# Patient Record
Sex: Male | Born: 1940 | Race: Black or African American | Hispanic: No | Smoking: Former smoker
Health system: Southern US, Community
[De-identification: ages and names within clinical notes are randomized; demographics above are authoritative.]

## PROBLEM LIST (undated history)

## (undated) DIAGNOSIS — S76119A Strain of unspecified quadriceps muscle, fascia and tendon, initial encounter: Secondary | ICD-10-CM

## (undated) DIAGNOSIS — E785 Hyperlipidemia, unspecified: Secondary | ICD-10-CM

## (undated) DIAGNOSIS — C8590 Non-Hodgkin lymphoma, unspecified, unspecified site: Secondary | ICD-10-CM

## (undated) DIAGNOSIS — I10 Essential (primary) hypertension: Secondary | ICD-10-CM

## (undated) DIAGNOSIS — I2510 Atherosclerotic heart disease of native coronary artery without angina pectoris: Secondary | ICD-10-CM

## (undated) DIAGNOSIS — I219 Acute myocardial infarction, unspecified: Secondary | ICD-10-CM

## (undated) DIAGNOSIS — H9190 Unspecified hearing loss, unspecified ear: Secondary | ICD-10-CM

## (undated) DIAGNOSIS — M1990 Unspecified osteoarthritis, unspecified site: Secondary | ICD-10-CM

## (undated) DIAGNOSIS — I255 Ischemic cardiomyopathy: Secondary | ICD-10-CM

## (undated) HISTORY — DX: Non-Hodgkin lymphoma, unspecified, unspecified site: C85.90

## (undated) HISTORY — PX: COLONOSCOPY: SHX174

## (undated) HISTORY — PX: OTHER SURGICAL HISTORY: SHX169

---

## 1998-09-20 ENCOUNTER — Ambulatory Visit (HOSPITAL_COMMUNITY): Admission: RE | Admit: 1998-09-20 | Discharge: 1998-09-20 | Payer: Self-pay | Admitting: *Deleted

## 2002-05-22 ENCOUNTER — Ambulatory Visit (HOSPITAL_COMMUNITY): Admission: RE | Admit: 2002-05-22 | Discharge: 2002-05-22 | Payer: Self-pay | Admitting: *Deleted

## 2008-02-10 HISTORY — PX: THROAT SURGERY: SHX803

## 2008-05-28 ENCOUNTER — Encounter: Admission: RE | Admit: 2008-05-28 | Discharge: 2008-08-07 | Payer: Self-pay | Admitting: Otolaryngology

## 2011-06-08 ENCOUNTER — Other Ambulatory Visit: Payer: Self-pay | Admitting: Family Medicine

## 2011-06-08 DIAGNOSIS — R609 Edema, unspecified: Secondary | ICD-10-CM

## 2011-06-09 ENCOUNTER — Ambulatory Visit
Admission: RE | Admit: 2011-06-09 | Discharge: 2011-06-09 | Disposition: A | Discharge status: Home / Self Care | Payer: BC Managed Care – PPO | Source: Ambulatory Visit | Attending: Family Medicine | Admitting: Family Medicine

## 2011-06-09 DIAGNOSIS — R609 Edema, unspecified: Secondary | ICD-10-CM

## 2014-02-09 DIAGNOSIS — I219 Acute myocardial infarction, unspecified: Secondary | ICD-10-CM

## 2014-02-09 HISTORY — DX: Acute myocardial infarction, unspecified: I21.9

## 2014-02-10 ENCOUNTER — Emergency Department (HOSPITAL_COMMUNITY)
Admission: EM | Admit: 2014-02-10 | Discharge: 2014-02-10 | Disposition: A | Discharge status: Home / Self Care | Payer: Medicare PPO | Attending: Emergency Medicine | Admitting: Emergency Medicine

## 2014-02-10 ENCOUNTER — Encounter (HOSPITAL_COMMUNITY): Payer: Self-pay | Admitting: Emergency Medicine

## 2014-02-10 ENCOUNTER — Emergency Department (HOSPITAL_COMMUNITY): Payer: Medicare PPO

## 2014-02-10 DIAGNOSIS — W19XXXA Unspecified fall, initial encounter: Secondary | ICD-10-CM

## 2014-02-10 DIAGNOSIS — Y9389 Activity, other specified: Secondary | ICD-10-CM | POA: Diagnosis not present

## 2014-02-10 DIAGNOSIS — Y92009 Unspecified place in unspecified non-institutional (private) residence as the place of occurrence of the external cause: Secondary | ICD-10-CM | POA: Insufficient documentation

## 2014-02-10 DIAGNOSIS — E785 Hyperlipidemia, unspecified: Secondary | ICD-10-CM | POA: Insufficient documentation

## 2014-02-10 DIAGNOSIS — W010XXA Fall on same level from slipping, tripping and stumbling without subsequent striking against object, initial encounter: Secondary | ICD-10-CM | POA: Diagnosis not present

## 2014-02-10 DIAGNOSIS — I10 Essential (primary) hypertension: Secondary | ICD-10-CM | POA: Insufficient documentation

## 2014-02-10 DIAGNOSIS — Z79899 Other long term (current) drug therapy: Secondary | ICD-10-CM | POA: Insufficient documentation

## 2014-02-10 DIAGNOSIS — Y998 Other external cause status: Secondary | ICD-10-CM | POA: Diagnosis not present

## 2014-02-10 DIAGNOSIS — Z8739 Personal history of other diseases of the musculoskeletal system and connective tissue: Secondary | ICD-10-CM | POA: Diagnosis not present

## 2014-02-10 DIAGNOSIS — S8991XA Unspecified injury of right lower leg, initial encounter: Secondary | ICD-10-CM | POA: Diagnosis not present

## 2014-02-10 DIAGNOSIS — S8992XA Unspecified injury of left lower leg, initial encounter: Secondary | ICD-10-CM | POA: Diagnosis present

## 2014-02-10 DIAGNOSIS — S76112A Strain of left quadriceps muscle, fascia and tendon, initial encounter: Secondary | ICD-10-CM | POA: Diagnosis not present

## 2014-02-10 HISTORY — DX: Unspecified osteoarthritis, unspecified site: M19.90

## 2014-02-10 HISTORY — DX: Essential (primary) hypertension: I10

## 2014-02-10 HISTORY — DX: Hyperlipidemia, unspecified: E78.5

## 2014-02-10 NOTE — ED Notes (Signed)
Carl [name], Network engineer reported that nurse care manager is working on having pt's walker delivered to ED at this time

## 2014-02-10 NOTE — Progress Notes (Signed)
NCM will have RW shipped to home. Pt has [employer]. Best contact number [phone].Jonnie Finner RN CCM Case Mgmt phone [phone]

## 2014-02-10 NOTE — ED Notes (Signed)
Discharge delayed. Pt waiting for walker

## 2014-02-10 NOTE — ED Notes (Signed)
Bed: WA01 Expected date: 02/10/14 Expected time: 11:01 AM Means of arrival: Ambulance Comments: fall

## 2014-02-10 NOTE — Discharge Instructions (Signed)
Fall Prevention and Home Safety Falls cause injuries and can affect all age groups. It is possible to use preventive measures to significantly decrease the likelihood of falls. There are many simple measures which can make your home safer and prevent falls. OUTDOORS  Repair cracks and edges of walkways and driveways.  Remove high doorway thresholds.  Trim shrubbery on the main path into your home.  Have good outside lighting.  Clear walkways of tools, rocks, debris, and clutter.  Check that handrails are not broken and are securely fastened. Both sides of steps should have handrails.  Have leaves, snow, and ice cleared regularly.  Use sand or salt on walkways during winter months.  In the garage, clean up grease or oil spills. BATHROOM  Install night lights.  Install grab bars by the toilet and in the tub and shower.  Use non-skid mats or decals in the tub or shower.  Place a plastic non-slip stool in the shower to sit on, if needed.  Keep floors dry and clean up all water on the floor immediately.  Remove soap buildup in the tub or shower on a regular basis.  Secure bath mats with non-slip, double-sided rug tape.  Remove throw rugs and tripping hazards from the floors. BEDROOMS  Install night lights.  Make sure a bedside light is easy to reach.  Do not use oversized bedding.  Keep a telephone by your bedside.  Have a firm chair with side arms to use for getting dressed.  Remove throw rugs and tripping hazards from the floor. KITCHEN  Keep handles on pots and pans turned toward the center of the stove. Use back burners when possible.  Clean up spills quickly and allow time for drying.  Avoid walking on wet floors.  Avoid hot utensils and knives.  Position shelves so they are not too high or low.  Place commonly used objects within easy reach.  If necessary, use a sturdy step stool with a grab bar when reaching.  Keep electrical cables out of the  way.  Do not use floor polish or wax that makes floors slippery. If you must use wax, use non-skid floor wax.  Remove throw rugs and tripping hazards from the floor. STAIRWAYS  Never leave objects on stairs.  Place handrails on both sides of stairways and use them. Fix any loose handrails. Make sure handrails on both sides of the stairways are as long as the stairs.  Check carpeting to make sure it is firmly attached along stairs. Make repairs to worn or loose carpet promptly.  Avoid placing throw rugs at the top or bottom of stairways, or properly secure the rug with carpet tape to prevent slippage. Get rid of throw rugs, if possible.  Have an electrician put in a light switch at the top and bottom of the stairs. OTHER FALL PREVENTION TIPS  Wear low-heel or rubber-soled shoes that are supportive and fit well. Wear closed toe shoes.  When using a stepladder, make sure it is fully opened and both spreaders are firmly locked. Do not climb a closed stepladder.  Add color or contrast paint or tape to grab bars and handrails in your home. Place contrasting color strips on first and last steps.  Learn and use mobility aids as needed. Install an electrical emergency response system.  Turn on lights to avoid dark areas. Replace light bulbs that burn out immediately. Get light switches that glow.  Arrange furniture to create clear pathways. Keep furniture in the same place.  Firmly attach carpet with non-skid or double-sided tape.  Eliminate uneven floor surfaces.  Select a carpet pattern that does not visually hide the edge of steps.  Be aware of all pets. OTHER HOME SAFETY TIPS  Set the water temperature for 120 F (48.8 C).  Keep emergency numbers on or near the telephone.  Keep smoke detectors on every level of the home and near sleeping areas. Document Released: 01/16/2002 Document Revised: 07/28/2011 Document Reviewed: 04/17/2011 [facility] Patient Information 2015  [place], [place]. This information is not intended to replace advice given to you by your health care provider. Make sure you discuss any questions you have with your health care provider.   Tendon Injury Tendons are strong, cordlike structures that connect muscle to bone. Tendons are made up of woven fibers, like a rope. A tendon injury is a tear (rupture) of the tendon. The rupture may be partial (only a few of the fibers in your tendon rupture) or complete (your entire tendon ruptures). CAUSES  Tendon injuries can be caused by high-stress activities, such as sports. They also can be caused by a repetitive injury or by a single injury from an excessive, rapid force. SYMPTOMS  Symptoms of tendon injury include pain when you move the joint close to the tendon. Other symptoms are swelling, redness, and warmth. DIAGNOSIS  Tendon injuries often can be diagnosed by physical exam. However, sometimes an X-ray exam or advanced imaging, such as magnetic resonance imaging (MRI), is necessary to determine the extent of the injury. TREATMENT  Partial tendon ruptures often can be treated with immobilization. A splint, bandage, or removable brace usually is used to immobilize the injured tendon. Most injured tendons need to be immobilized for 1-2 months before they are completely healed. Complete tendon ruptures may require surgical reattachment. Document Released: 03/05/2004 Document Revised: 01/15/2011 Document Reviewed: 04/19/2011 [facility] Patient Information 2015 [place], [place]. This information is not intended to replace advice given to you by your health care provider. Make sure you discuss any questions you have with your health care provider.

## 2014-02-10 NOTE — ED Notes (Signed)
Waiting on home health to bring walker still, staff member is en route, pt and family updated

## 2014-02-10 NOTE — ED Notes (Signed)
PER EMS- pt picked up from home with c/o fall x10hours.  Reports bilateral knee pain, 8/10 if standing, with difficulty lifting leg. Pt tripped and fell, denies LOC or head injury. Arrived to ED alert and oriented per baseline.  Voice is horse per baseline. Bruising noted, no deformities.

## 2014-02-10 NOTE — ED Notes (Signed)
Bed: WHALA Expected date:  Expected time:  Means of arrival:  Comments: 

## 2014-02-10 NOTE — ED Provider Notes (Signed)
CSN: [id number]     Arrival date & time 02/10/14  1111 History   First MD Initiated Contact with Patient 02/10/14 1128     Chief Complaint  Patient presents with  . Fall  . Knee Pain     (Consider location/radiation/quality/duration/timing/severity/associated sxs/prior Treatment) HPI Comments: Patient presents with bilateral lateral knee pain, left greater than right after fall in his home about 1 AM early this morning.  He states he thinks he may be tripped on some furniture while getting ready for bed.  Denies head injury or loss of consciousness as well as anticoagulant use.  He states he has been unable to ambulate, and cannot lift his left leg up off the bed.  Patient is a 74 y.o. male presenting with fall and knee pain.  Fall Pertinent negatives include no chest pain, no abdominal pain, no headaches and no shortness of breath.  Knee Pain Associated symptoms: no back pain, no fatigue and no fever     Past Medical History  Diagnosis Date  . Hypertension   . Hyperlipidemia   . Arthritis    Past Surgical History  Procedure Laterality Date  . Throat surgery     History reviewed. No pertinent family history. History  Substance Use Topics  . Smoking status: Never Smoker   . Smokeless tobacco: Not on file  . Alcohol Use: Yes    Review of Systems  Constitutional: Negative for fever, activity change, appetite change and fatigue.  HENT: Negative for congestion, facial swelling, rhinorrhea and trouble swallowing.   Eyes: Negative for photophobia and pain.  Respiratory: Negative for cough, chest tightness and shortness of breath.   Cardiovascular: Negative for chest pain and leg swelling.  Gastrointestinal: Negative for nausea, vomiting, abdominal pain, diarrhea and constipation.  Endocrine: Negative for polydipsia and polyuria.  Genitourinary: Negative for dysuria, urgency, decreased urine volume and difficulty urinating.  Musculoskeletal: Negative for back pain and gait  problem.  Skin: Negative for color change, rash and wound.  Allergic/Immunologic: Negative for immunocompromised state.  Neurological: Positive for dizziness. Negative for facial asymmetry, speech difficulty, weakness, numbness and headaches.  Psychiatric/Behavioral: Negative for confusion, decreased concentration and agitation.      Allergies  Review of patient's allergies indicates no known allergies.  Home Medications   Prior to Admission medications   Medication Sig Start Date End Date Taking? Authorizing Provider  amLODipine (NORVASC) 5 MG tablet Take 5 mg by mouth daily.  01/25/14  Yes Historical Provider, MD  cholecalciferol (VITAMIN D) 1000 UNITS tablet Take 1,000 Units by mouth daily.   Yes Historical Provider, MD  Multiple Vitamin (MULTIVITAMIN WITH MINERALS) TABS tablet Take 1 tablet by mouth daily.   Yes Historical Provider, MD   BP 163/79 mmHg  Pulse 88  Temp(Src) 98.4 F (36.9 C) (Oral)  Resp 16  SpO2 96% Physical Exam  Constitutional: He is oriented to person, place, and time. He appears well-developed and well-nourished. No distress.  HENT:  Head: Normocephalic and atraumatic.  Mouth/Throat: No oropharyngeal exudate.  Eyes: Pupils are equal, round, and reactive to light.  Neck: Normal range of motion. Neck supple.  Cardiovascular: Normal rate, regular rhythm and normal heart sounds.  Exam reveals no gallop and no friction rub.   No murmur heard. Pulmonary/Chest: Effort normal and breath sounds normal. No respiratory distress. He has no wheezes. He has no rales.  Abdominal: Soft. Bowel sounds are normal. He exhibits no distension and no mass. There is no tenderness. There is no rebound and no  guarding.  Musculoskeletal: He exhibits no edema.       Right knee: He exhibits bony tenderness.       Left knee: He exhibits decreased range of motion. Tenderness found.       Legs: Neurological: He is alert and oriented to person, place, and time.  Skin: Skin is warm  and dry.  Psychiatric: He has a normal mood and affect.    ED Course  Procedures (including critical care time) Labs Review Labs Reviewed - No data to display  Imaging Review No results found.   EKG Interpretation None      MDM   Final diagnoses:  Fall at home    Pt is a 74 y.o. male with Pmhx as above who presents with bilateral knee pain, left greater then right since a fall at home about 1 AM yesterday while he was getting ready for bed.  He thinks maybe he got tripped on some furniture.  He denies head injury or loss of consciousness.  Daughter reports he has had some dizziness lately which she describes as swimmy headedness when he lays down, which last 2-3 seconds at a time.  On physical exam vital signs are stable and he is in no acute distress.  He has a deformity just superior to his left patella and is unable to fully flex or extend the left knee.  He is also unable to lift the left leg off the bed.  He has normal sensation distally and normal range of motion of the right leg.   X-rays of bilateral knees, pelvis and left femur are negative for acute bony injury.  Physical exam is consistent with quadriceps tendon rupture.  Patient placed in a left knee immobilizer, given crutches and is able to ambulate slowly.  I spoke with Dr. Sharol Given, of [facility] orthopedics who would like him to call the office on Monday for follow-up appointment.  Patient again does not want pain medication.  He's been instructed on rest, ice and elevation.   Artis Delay evaluation in the Emergency Department is complete. It has been determined that no acute conditions requiring further emergency intervention are present at this time. The patient/guardian have been advised of the diagnosis and plan. We have discussed signs and symptoms that warrant return to the ED, such as changes or worsening in symptoms, worsening pain, swelling.      Ernestina Patches, MD 02/10/14 1324

## 2014-02-10 NOTE — ED Notes (Signed)
Unable to ambulate with crutches, requesting a walker. Dr made aware and reported she will order a walker for pt.

## 2014-02-10 NOTE — Care Management (Signed)
ED CM spoke with patient's family in regards to recommendation for HHPT services and DME rolling walker. Patient and family agreeable, offered choice. Carl [name] selected. Referral faxed to [facility] [phone], received fax confirmation. DME rolling walker delivered to patient in the ED.  Patient and family agreeable with disposition plan. Discussed that within 24-48 hours someone from [facility] will call to arrange an initial assessment visit.  CM Contact information provided should any further questions or concerns should arise.

## 2014-02-15 NOTE — Patient Instructions (Addendum)
Carl [name]  02/15/2014   Your procedure is scheduled on: 02/20/14   Report to [facility]  Entrance and follow signs to               [place] at 2:15 PM   Call this number if you have problems the morning of surgery [phone]   Remember:  Do not eat food  :After Midnight.             MAY HAVE CLEAR LIQUIDS UNTIL 11:00 AM            CLEAR LIQUID DIET   Foods Allowed                                                                     Foods Excluded  Coffee and tea, regular and decaf                             liquids that you cannot  Plain Jell-O in any flavor                                             see through such as: Fruit ices (not with fruit pulp)                                     milk, soups, orange juice  Iced Popsicles                                    All solid food Carbonated beverages, regular and diet                                    Cranberry, grape and apple juices Sports drinks like Gatorade Lightly seasoned clear broth or consume(fat free) Sugar, honey syrup  _____________________________________________________________________        Take these medicines the morning of surgery with A SIP OF WATER: NONE                               You may not have any metal on your body including hair pins and              piercings  Do not wear jewelry, make-up, lotions, powders or perfumes.             Do not wear nail polish.  Do not shave  48 hours prior to surgery.              Men may shave face and neck.   Do not bring valuables to the hospital. [place].  Contacts, dentures or bridgework may  not be worn into surgery.  Leave suitcase in the car. After surgery it may be brought to your room.     Patients discharged the day of surgery will not be allowed to drive home.  Name and phone number of your driver:  Special Instructions: N/A              Please  read over the following fact sheets you were given: _____________________________________________________________________                                                     Carl [name]  Before surgery, you can play an important role.  Because skin is not sterile, your skin needs to be as free of germs as possible.  You can reduce the number of germs on your skin by washing with CHG (chlorahexidine gluconate) soap before surgery.  CHG is an antiseptic cleaner which kills germs and bonds with the skin to continue killing germs even after washing. Please DO NOT use if you have an allergy to CHG or antibacterial soaps.  If your skin becomes reddened/irritated stop using the CHG and inform your nurse when you arrive at Short Stay. Do not shave (including legs and underarms) for at least 48 hours prior to the first CHG shower.  You may shave your face. Please follow these instructions carefully:   1.  Shower with CHG Soap the night before surgery and the  morning of Surgery.   2.  If you choose to wash your hair, wash your hair first as usual with your  normal  Shampoo.   3.  After you shampoo, rinse your hair and body thoroughly to remove the  shampoo.                                         4.  Use CHG as you would any other liquid soap.  You can apply chg directly  to the skin and wash . Gently wash with scrungie or clean wascloth    5.  Apply the CHG Soap to your body ONLY FROM THE NECK DOWN.   Do not use on open                           Wound or open sores. Avoid contact with eyes, ears mouth and genitals (private parts).                        Genitals (private parts) with your normal soap.              6.  Wash thoroughly, paying special attention to the area where your surgery  will be performed.   7.  Thoroughly rinse your body with warm water from the neck down.   8.  DO NOT shower/wash with your normal soap after using and rinsing off  the CHG Soap .                 9.  Pat yourself dry with a clean towel.             10.  Wear clean pajamas.  11.  Place clean sheets on your bed the night of your first shower and do not  sleep with pets.  Day of Surgery : Do not apply any lotions/deodorants the morning of surgery.  Please wear clean clothes to the hospital/surgery center.  FAILURE TO FOLLOW THESE INSTRUCTIONS MAY RESULT IN THE CANCELLATION OF YOUR SURGERY    PATIENT SIGNATURE_________________________________  ______________________________________________________________________     Carl [name]  An incentive spirometer is a tool that can help keep your lungs clear and active. This tool measures how well you are filling your lungs with each breath. Taking long deep breaths may help reverse or decrease the chance of developing breathing (pulmonary) problems (especially infection) following:  A long period of time when you are unable to move or be active. BEFORE THE PROCEDURE   If the spirometer includes an indicator to show your best effort, your nurse or respiratory therapist will set it to a desired goal.  If possible, sit up straight or lean slightly forward. Try not to slouch.  Hold the incentive spirometer in an upright position. INSTRUCTIONS FOR USE   Sit on the edge of your bed if possible, or sit up as far as you can in bed or on a chair.  Hold the incentive spirometer in an upright position.  Breathe out normally.  Place the mouthpiece in your mouth and seal your lips tightly around it.  Breathe in slowly and as deeply as possible, raising the piston or the ball toward the top of the column.  Hold your breath for 3-5 seconds or for as long as possible. Allow the piston or ball to fall to the bottom of the column.  Remove the mouthpiece from your mouth and breathe out normally.  Rest for a few seconds and repeat Steps 1 through 7 at least 10 times every 1-2 hours when you are awake. Take your  time and take a few normal breaths between deep breaths.  The spirometer may include an indicator to show your best effort. Use the indicator as a goal to work toward during each repetition.  After each set of 10 deep breaths, practice coughing to be sure your lungs are clear. If you have an incision (the cut made at the time of surgery), support your incision when coughing by placing a pillow or rolled up towels firmly against it. Once you are able to get out of bed, walk around indoors and cough well. You may stop using the incentive spirometer when instructed by your caregiver.  RISKS AND COMPLICATIONS  Take your time so you do not get dizzy or light-headed.  If you are in pain, you may need to take or ask for pain medication before doing incentive spirometry. It is harder to take a deep breath if you are having pain. AFTER USE  Rest and breathe slowly and easily.  It can be helpful to keep track of a log of your progress. Your caregiver can provide you with a simple table to help with this. If you are using the spirometer at home, follow these instructions: [place] IF:   You are having difficultly using the spirometer.  You have trouble using the spirometer as often as instructed.  Your pain medication is not giving enough relief while using the spirometer.  You develop fever of 100.5 F (38.1 C) or higher. SEEK IMMEDIATE MEDICAL CARE IF:   You cough up bloody sputum that had not been present before.  You develop fever of 102 F (  38.9 C) or greater.  You develop worsening pain at or near the incision site. MAKE SURE YOU:   Understand these instructions.  Will watch your condition.  Will get help right away if you are not doing well or get worse. Document Released: 06/08/2006 Document Revised: 04/20/2011 Document Reviewed: 08/09/2006 ExitCare Patient Information 2014 ExitCare,  [place].   ________________________________________________________________________  WHAT IS A BLOOD TRANSFUSION? Blood Transfusion Information  A transfusion is the replacement of blood or some of its parts. Blood is made up of multiple cells which provide different functions.  Red blood cells carry oxygen and are used for blood loss replacement.  White blood cells fight against infection.  Platelets control bleeding.  Plasma helps clot blood.  Other blood products are available for specialized needs, such as hemophilia or other clotting disorders. BEFORE THE TRANSFUSION  Who gives blood for transfusions?   Healthy volunteers who are fully evaluated to make sure their blood is safe. This is blood bank blood. Transfusion therapy is the safest it has ever been in the practice of medicine. Before blood is taken from a donor, a complete history is taken to make sure that person has no history of diseases nor engages in risky social behavior (examples are intravenous drug use or sexual activity with multiple partners). The donor's travel history is screened to minimize risk of transmitting infections, such as malaria. The donated blood is tested for signs of infectious diseases, such as HIV and hepatitis. The blood is then tested to be sure it is compatible with you in order to minimize the chance of a transfusion reaction. If you or a relative donates blood, this is often done in anticipation of surgery and is not appropriate for emergency situations. It takes many days to process the donated blood. RISKS AND COMPLICATIONS Although transfusion therapy is very safe and saves many lives, the main dangers of transfusion include:   Getting an infectious disease.  Developing a transfusion reaction. This is an allergic reaction to something in the blood you were given. Every precaution is taken to prevent this. The decision to have a blood transfusion has been considered carefully by your caregiver  before blood is given. Blood is not given unless the benefits outweigh the risks. AFTER THE TRANSFUSION  Right after receiving a blood transfusion, you will usually feel much better and more energetic. This is especially true if your red blood cells have gotten low (anemic). The transfusion raises the level of the red blood cells which carry oxygen, and this usually causes an energy increase.  The nurse administering the transfusion will monitor you carefully for complications. HOME CARE INSTRUCTIONS  No special instructions are needed after a transfusion. You may find your energy is better. Speak with your caregiver about any limitations on activity for underlying diseases you may have. SEEK MEDICAL CARE IF:   Your condition is not improving after your transfusion.  You develop redness or irritation at the intravenous (IV) site. SEEK IMMEDIATE MEDICAL CARE IF:  Any of the following symptoms occur over the next 12 hours:  Shaking chills.  You have a temperature by mouth above 102 F (38.9 C), not controlled by medicine.  Chest, back, or muscle pain.  People around you feel you are not acting correctly or are confused.  Shortness of breath or difficulty breathing.  Dizziness and fainting.  You get a rash or develop hives.  You have a decrease in urine output.  Your urine turns a dark color or changes to  pink, red, or brown. Any of the following symptoms occur over the next 10 days:  You have a temperature by mouth above 102 F (38.9 C), not controlled by medicine.  Shortness of breath.  Weakness after normal activity.  The white part of the eye turns yellow (jaundice).  You have a decrease in the amount of urine or are urinating less often.  Your urine turns a dark color or changes to pink, red, or brown. Document Released: 01/24/2000 Document Revised: 04/20/2011 Document Reviewed: 09/12/2007 [facility] Patient Information 2014 [place],  [place].  _______________________________________________________________________

## 2014-02-16 ENCOUNTER — Other Ambulatory Visit: Payer: Self-pay

## 2014-02-16 ENCOUNTER — Encounter (HOSPITAL_COMMUNITY): Payer: Self-pay

## 2014-02-16 ENCOUNTER — Ambulatory Visit (HOSPITAL_COMMUNITY)
Admission: RE | Admit: 2014-02-16 | Discharge: 2014-02-16 | Disposition: A | Discharge status: Home / Self Care | Payer: Medicare PPO | Source: Ambulatory Visit | Attending: Anesthesiology | Admitting: Anesthesiology

## 2014-02-16 ENCOUNTER — Encounter (HOSPITAL_COMMUNITY)
Admission: RE | Admit: 2014-02-16 | Discharge: 2014-02-16 | Disposition: A | Discharge status: Home / Self Care | Payer: Medicare PPO | Source: Ambulatory Visit | Attending: Orthopedic Surgery | Admitting: Orthopedic Surgery

## 2014-02-16 DIAGNOSIS — I493 Ventricular premature depolarization: Secondary | ICD-10-CM | POA: Insufficient documentation

## 2014-02-16 DIAGNOSIS — I10 Essential (primary) hypertension: Secondary | ICD-10-CM | POA: Diagnosis not present

## 2014-02-16 DIAGNOSIS — X58XXXA Exposure to other specified factors, initial encounter: Secondary | ICD-10-CM | POA: Diagnosis not present

## 2014-02-16 DIAGNOSIS — S76119A Strain of unspecified quadriceps muscle, fascia and tendon, initial encounter: Secondary | ICD-10-CM | POA: Diagnosis not present

## 2014-02-16 DIAGNOSIS — I771 Stricture of artery: Secondary | ICD-10-CM | POA: Insufficient documentation

## 2014-02-16 DIAGNOSIS — M4184 Other forms of scoliosis, thoracic region: Secondary | ICD-10-CM | POA: Diagnosis not present

## 2014-02-16 DIAGNOSIS — Z01818 Encounter for other preprocedural examination: Secondary | ICD-10-CM | POA: Diagnosis present

## 2014-02-16 HISTORY — DX: Strain of unspecified quadriceps muscle, fascia and tendon, initial encounter: S76.119A

## 2014-02-16 LAB — CBC
HCT: 43 % (ref 39.0–52.0)
Hemoglobin: 14.2 g/dL (ref 13.0–17.0)
MCH: 31.1 pg (ref 26.0–34.0)
MCHC: 33 g/dL (ref 30.0–36.0)
MCV: 94.1 fL (ref 78.0–100.0)
Platelets: 187 10*3/uL (ref 150–400)
RBC: 4.57 MIL/uL (ref 4.22–5.81)
RDW: 12.5 % (ref 11.5–15.5)
WBC: 6 10*3/uL (ref 4.0–10.5)

## 2014-02-16 LAB — BASIC METABOLIC PANEL
Anion gap: 3 — ABNORMAL LOW (ref 5–15)
BUN: 18 mg/dL (ref 6–23)
CO2: 25 mmol/L (ref 19–32)
Calcium: 8.6 mg/dL (ref 8.4–10.5)
Chloride: 110 mEq/L (ref 96–112)
Creatinine, Ser: 1.11 mg/dL (ref 0.50–1.35)
GFR calc Af Amer: 73 mL/min — ABNORMAL LOW (ref >90)
GFR calc non Af Amer: 63 mL/min — ABNORMAL LOW (ref >90)
Glucose, Bld: 103 mg/dL — ABNORMAL HIGH (ref 70–99)
Potassium: 3.9 mmol/L (ref 3.5–5.1)
Sodium: 138 mmol/L (ref 135–145)

## 2014-02-16 LAB — URINALYSIS, ROUTINE W REFLEX MICROSCOPIC
Bilirubin Urine: NEGATIVE
Glucose, UA: NEGATIVE mg/dL
Ketones, ur: NEGATIVE mg/dL
Leukocytes, UA: NEGATIVE
Nitrite: NEGATIVE
Protein, ur: NEGATIVE mg/dL
Specific Gravity, Urine: 1.022 (ref 1.005–1.030)
Urobilinogen, UA: 1 mg/dL (ref 0.0–1.0)
pH: 5.5 (ref 5.0–8.0)

## 2014-02-16 LAB — URINE MICROSCOPIC-ADD ON

## 2014-02-16 LAB — APTT: aPTT: 34 seconds (ref 24–37)

## 2014-02-16 LAB — PROTIME-INR
INR: 1.05 (ref 0.00–1.49)
Prothrombin Time: 13.8 seconds (ref 11.6–15.2)

## 2014-02-16 LAB — ABO/RH: ABO/RH(D): B POS

## 2014-02-16 NOTE — Progress Notes (Signed)
   02/16/14 1148  OBSTRUCTIVE SLEEP APNEA  Have you ever been diagnosed with sleep apnea through a sleep study? No  Do you snore loudly (loud enough to be heard through closed doors)?  1  Do you often feel tired, fatigued, or sleepy during the daytime? 0  Has anyone observed you stop breathing during your sleep? 0  Do you have, or are you being treated for high blood pressure? 1  BMI more than 35 kg/m2? 1  Age over 76 years old? 1  Neck circumference greater than 40 cm/16 inches? 1  Gender: 1  Obstructive Sleep Apnea Score 6  Score 4 or greater  Results sent to PCP

## 2014-02-19 NOTE — Anesthesia Preprocedure Evaluation (Addendum)
Anesthesia Evaluation  Patient identified by MRN, date of birth, ID band Patient awake    Reviewed: Allergy & Precautions, NPO status , Patient's Chart, lab work & pertinent test results  Airway Mallampati: II  TM Distance: >3 FB Neck ROM: Full   Comment: Hoarseness chronically.  Had vocal cord procedure in 4540 with Dr. Erik Obey. Dental no notable dental hx.    Pulmonary neg pulmonary ROS,  breath sounds clear to auscultation  Pulmonary exam normal       Cardiovascular hypertension, Pt. on medications Rhythm:Regular Rate:Normal     Neuro/Psych negative neurological ROS  negative psych ROS   GI/Hepatic negative GI ROS, Neg liver ROS,   Endo/Other  negative endocrine ROS  Renal/GU negative Renal ROS  negative genitourinary   Musculoskeletal  (+) Arthritis -,   Abdominal (+) + obese,   Peds negative pediatric ROS (+)  Hematology negative hematology ROS (+)   Anesthesia Other Findings   Reproductive/Obstetrics negative OB ROS                           Anesthesia Physical Anesthesia Plan  ASA: II  Anesthesia Plan: Spinal   Post-op Pain Management:    Induction: Intravenous  Airway Management Planned:   Additional Equipment:   Intra-op Plan:   Post-operative Plan:   Informed Consent: I have reviewed the patients History and Physical, chart, labs and discussed the procedure including the risks, benefits and alternatives for the proposed anesthesia with the patient or authorized representative who has indicated his/her understanding and acceptance.   Dental advisory given  Plan Discussed with: CRNA  Anesthesia Plan Comments: (Discussed spinal and general. Discussed risks/benefits of spinal including headache, backache, failure, bleeding, infection, and nerve damage. Patient consents to spinal. Questions answered. Coagulation studies and platelet count acceptable.)        Anesthesia Quick Evaluation

## 2014-02-19 NOTE — Progress Notes (Signed)
Chart reviewed by Dr. Kirke Shaggy

## 2014-02-19 NOTE — H&P (Signed)
Carl [name] is an 74 y.o. male.    Chief Complaint: Left quad tendon rupture  Procedure:  Left quad tendon repair  HPI: Pt is a 74 y.o. male complaining of left knee pain and inability to extend his left knee.  He states he thinks he may be tripped on some furniture while getting ready for bed early on the morning on 1/2/016. Denies head injury or loss of consciousness as well as anticoagulant use. He states he has been unable to ambulate, and cannot lift his left leg up off the bed.  Physical exam in the ER is consistent with quadriceps tendon rupture. Patient placed in a left knee immobilizer, given crutches and is able to ambulate slowly.  Referred to ortho.  Patient presented in the clinic and Dr. Alvan [name] discussed various options. He wishes to proceed with a repair of the left quad tendon.  Risks, benefits and expectations were discussed with the patient. Patient understand the risks, benefits and expectations and wishes to proceed with surgery.   PCP:  Carl Crutch, MD  D/C Plans:      Home with HHPT  Post-op Meds:       No Rx given  Decadron:      Is to be given  FYI:     ASA post-op  Norco post-op   PMH: Past Medical History  Diagnosis Date  . Hypertension   . Hyperlipidemia   . Arthritis   . Quadriceps tendon rupture     LEFT    PSH: Past Surgical History  Procedure Laterality Date  . Throat surgery  2010    Social History:  reports that he has never smoked. He does not have any smokeless tobacco history on file. He reports that he drinks alcohol. He reports that he does not use illicit drugs.  Allergies:  No Known Allergies  Medications: No current facility-administered medications for this encounter.   Current Outpatient Prescriptions  Medication Sig Dispense Refill  . acetaminophen (TYLENOL) 500 MG tablet Take 500 mg by mouth every 6 (six) hours as needed (Pain).    Marland Kitchen amLODipine (NORVASC) 5 MG tablet Take 5 mg by mouth at bedtime.     Marland Kitchen aspirin  EC 81 MG tablet Take 81 mg by mouth daily.    . Multiple Vitamin (MULTIVITAMIN WITH MINERALS) TABS tablet Take 1 tablet by mouth daily.    Carl [name] Glycol-Propyl Glycol (SYSTANE OP) Apply 1 drop to eye 3 (three) times daily as needed (Dry eyes).    . STUDY MEDICATION Take 1 tablet by mouth daily. Through [place] is either getting vitamin d or placebo. Pt unsure of strength of medication       Review of Systems  Constitutional: Negative.   HENT: Negative.   Eyes: Negative.   Respiratory: Negative.   Cardiovascular: Negative.   Gastrointestinal: Negative.   Genitourinary: Negative.   Musculoskeletal: Positive for joint pain.  Skin: Negative.   Neurological: Positive for dizziness.  Endo/Heme/Allergies: Negative.   Psychiatric/Behavioral: Negative.       Physical Exam  Constitutional: He is oriented to person, place, and time. He appears well-developed and well-nourished.  HENT:  Head: Normocephalic and atraumatic.  Eyes: Pupils are equal, round, and reactive to light.  Neck: Neck supple. No JVD present. No tracheal deviation present. No thyromegaly present.  Cardiovascular: Normal rate, regular rhythm, normal heart sounds and intact distal pulses.   Respiratory: Effort normal and breath sounds normal. No respiratory distress. He has no wheezes.  GI: Soft. There is no tenderness. There is no guarding.  Musculoskeletal:       Left knee: He exhibits decreased range of motion, swelling, deformity, abnormal patellar mobility and bony tenderness. He exhibits no ecchymosis, no laceration, no erythema and normal alignment. Tenderness found.       Legs: Lymphadenopathy:    He has no cervical adenopathy.  Neurological: He is alert and oriented to person, place, and time.  Skin: Skin is warm and dry.  Psychiatric: He has a normal mood and affect.     Assessment/Plan Assessment:     Left quad tendon rupture  Plan: Patient will undergo a left quad tendon repair on 02/20/2014  per Dr. Alvan [name] at [facility]. Risks benefits and expectations were discussed with the patient. Patient understand risks, benefits and expectations and wishes to proceed.       [place] Carl Campanaro   PA-C  02/19/2014, 3:48 PM

## 2014-02-20 ENCOUNTER — Encounter (HOSPITAL_COMMUNITY)
Admission: RE | Disposition: A | Discharge status: Skilled Nursing Facility | Payer: Self-pay | Source: Ambulatory Visit | Attending: Orthopedic Surgery

## 2014-02-20 ENCOUNTER — Observation Stay (HOSPITAL_COMMUNITY)
Admission: RE | Admit: 2014-02-20 | Discharge: 2014-02-22 | Disposition: A | Discharge status: Skilled Nursing Facility | Payer: Medicare PPO | Source: Ambulatory Visit | Attending: Orthopedic Surgery | Admitting: Orthopedic Surgery

## 2014-02-20 ENCOUNTER — Encounter (HOSPITAL_COMMUNITY): Payer: Self-pay | Admitting: *Deleted

## 2014-02-20 ENCOUNTER — Ambulatory Visit (HOSPITAL_COMMUNITY): Payer: Medicare PPO | Admitting: Anesthesiology

## 2014-02-20 DIAGNOSIS — M25462 Effusion, left knee: Secondary | ICD-10-CM | POA: Insufficient documentation

## 2014-02-20 DIAGNOSIS — I10 Essential (primary) hypertension: Secondary | ICD-10-CM | POA: Diagnosis not present

## 2014-02-20 DIAGNOSIS — M1990 Unspecified osteoarthritis, unspecified site: Secondary | ICD-10-CM | POA: Diagnosis not present

## 2014-02-20 DIAGNOSIS — E669 Obesity, unspecified: Secondary | ICD-10-CM | POA: Insufficient documentation

## 2014-02-20 DIAGNOSIS — Z6835 Body mass index (BMI) 35.0-35.9, adult: Secondary | ICD-10-CM | POA: Insufficient documentation

## 2014-02-20 DIAGNOSIS — M66862 Spontaneous rupture of other tendons, left lower leg: Principal | ICD-10-CM | POA: Insufficient documentation

## 2014-02-20 DIAGNOSIS — Z7982 Long term (current) use of aspirin: Secondary | ICD-10-CM | POA: Insufficient documentation

## 2014-02-20 DIAGNOSIS — S76112D Strain of left quadriceps muscle, fascia and tendon, subsequent encounter: Secondary | ICD-10-CM

## 2014-02-20 DIAGNOSIS — E785 Hyperlipidemia, unspecified: Secondary | ICD-10-CM | POA: Insufficient documentation

## 2014-02-20 DIAGNOSIS — S76119A Strain of unspecified quadriceps muscle, fascia and tendon, initial encounter: Secondary | ICD-10-CM | POA: Diagnosis present

## 2014-02-20 HISTORY — PX: QUADRICEPS TENDON REPAIR: SHX756

## 2014-02-20 LAB — TYPE AND SCREEN
ABO/RH(D): B POS
Antibody Screen: NEGATIVE

## 2014-02-20 SURGERY — REPAIR, TENDON, QUADRICEPS
Anesthesia: Spinal | Site: Knee | Laterality: Left

## 2014-02-20 MED ORDER — MENTHOL 3 MG MT LOZG
1.0000 | LOZENGE | OROMUCOSAL | Status: DC | PRN
  Filled 2014-02-20: qty 9

## 2014-02-20 MED ORDER — PHENYLEPHRINE HCL 10 MG/ML IJ SOLN
INTRAMUSCULAR | Status: DC | PRN
  Administered 2014-02-20 (×4): 80 ug via INTRAVENOUS

## 2014-02-20 MED ORDER — ONDANSETRON HCL 4 MG/2ML IJ SOLN: 4.0000 mg | Freq: Four times a day (QID) | INTRAMUSCULAR | Status: DC | PRN

## 2014-02-20 MED ORDER — METOCLOPRAMIDE HCL 5 MG/ML IJ SOLN: 5.0000 mg | Freq: Three times a day (TID) | INTRAMUSCULAR | Status: DC | PRN

## 2014-02-20 MED ORDER — MIDAZOLAM HCL 5 MG/5ML IJ SOLN
INTRAMUSCULAR | Status: DC | PRN
  Administered 2014-02-20: 2 mg via INTRAVENOUS

## 2014-02-20 MED ORDER — CHLORHEXIDINE GLUCONATE 4 % EX LIQD: 60.0000 mL | Freq: Once | CUTANEOUS | Status: DC

## 2014-02-20 MED ORDER — SODIUM CHLORIDE 0.9 % IV SOLN
INTRAVENOUS | Status: DC
  Filled 2014-02-20 (×4): qty 1000

## 2014-02-20 MED ORDER — BISACODYL 10 MG RE SUPP: 10.0000 mg | Freq: Every day | RECTAL | Status: DC | PRN

## 2014-02-20 MED ORDER — METHOCARBAMOL 500 MG PO TABS: 500.0000 mg | ORAL_TABLET | Freq: Four times a day (QID) | ORAL | Status: DC | PRN

## 2014-02-20 MED ORDER — DSS 100 MG PO CAPS: 100.0000 mg | ORAL_CAPSULE | Freq: Two times a day (BID) | ORAL | Status: DC

## 2014-02-20 MED ORDER — DEXAMETHASONE SODIUM PHOSPHATE 10 MG/ML IJ SOLN
INTRAMUSCULAR | Status: AC
  Filled 2014-02-20: qty 1

## 2014-02-20 MED ORDER — ONDANSETRON HCL 4 MG/2ML IJ SOLN
INTRAMUSCULAR | Status: AC
  Filled 2014-02-20: qty 2

## 2014-02-20 MED ORDER — FENTANYL CITRATE 0.05 MG/ML IJ SOLN
INTRAMUSCULAR | Status: DC | PRN
  Administered 2014-02-20 (×2): 50 ug via INTRAVENOUS

## 2014-02-20 MED ORDER — POLYETHYLENE GLYCOL 3350 17 G PO PACK
17.0000 g | PACK | Freq: Two times a day (BID) | ORAL | Status: DC
  Administered 2014-02-21 – 2014-02-22 (×3): 17 g via ORAL
  Filled 2014-02-20 (×5): qty 1

## 2014-02-20 MED ORDER — FENTANYL CITRATE 0.05 MG/ML IJ SOLN
INTRAMUSCULAR | Status: AC
  Filled 2014-02-20: qty 2

## 2014-02-20 MED ORDER — DEXAMETHASONE SODIUM PHOSPHATE 10 MG/ML IJ SOLN
10.0000 mg | Freq: Once | INTRAMUSCULAR | Status: AC
  Administered 2014-02-20: 10 mg via INTRAVENOUS

## 2014-02-20 MED ORDER — ONDANSETRON HCL 4 MG/2ML IJ SOLN
INTRAMUSCULAR | Status: DC | PRN
  Administered 2014-02-20: 4 mg via INTRAVENOUS

## 2014-02-20 MED ORDER — ONDANSETRON HCL 4 MG PO TABS: 4.0000 mg | ORAL_TABLET | Freq: Four times a day (QID) | ORAL | Status: DC | PRN

## 2014-02-20 MED ORDER — HYDROCODONE-ACETAMINOPHEN 7.5-325 MG PO TABS: 1.0000 | ORAL_TABLET | ORAL | Status: DC | PRN

## 2014-02-20 MED ORDER — FERROUS SULFATE 325 (65 FE) MG PO TABS
325.0000 mg | ORAL_TABLET | Freq: Three times a day (TID) | ORAL | Status: DC
  Administered 2014-02-21 – 2014-02-22 (×5): 325 mg via ORAL
  Filled 2014-02-20 (×7): qty 1

## 2014-02-20 MED ORDER — PHENYLEPHRINE 40 MCG/ML (10ML) SYRINGE FOR IV PUSH (FOR BLOOD PRESSURE SUPPORT)
PREFILLED_SYRINGE | INTRAVENOUS | Status: AC
  Filled 2014-02-20: qty 10

## 2014-02-20 MED ORDER — ACETAMINOPHEN 10 MG/ML IV SOLN
1000.0000 mg | Freq: Once | INTRAVENOUS | Status: AC
  Administered 2014-02-20: 1000 mg via INTRAVENOUS
  Filled 2014-02-20: qty 100

## 2014-02-20 MED ORDER — DOCUSATE SODIUM 100 MG PO CAPS
100.0000 mg | ORAL_CAPSULE | Freq: Two times a day (BID) | ORAL | Status: DC
  Administered 2014-02-20 – 2014-02-22 (×4): 100 mg via ORAL
  Filled 2014-02-20 (×5): qty 1

## 2014-02-20 MED ORDER — DEXAMETHASONE SODIUM PHOSPHATE 10 MG/ML IJ SOLN
10.0000 mg | Freq: Once | INTRAMUSCULAR | Status: AC
  Administered 2014-02-21: 10 mg via INTRAVENOUS
  Filled 2014-02-20: qty 1

## 2014-02-20 MED ORDER — HYDROCODONE-ACETAMINOPHEN 7.5-325 MG PO TABS
1.0000 | ORAL_TABLET | ORAL | Status: DC
  Administered 2014-02-20: 1 via ORAL
  Administered 2014-02-21 (×2): 2 via ORAL
  Administered 2014-02-21: 1 via ORAL
  Administered 2014-02-21 (×3): 2 via ORAL
  Administered 2014-02-22: 1 via ORAL
  Administered 2014-02-22: 2 via ORAL
  Filled 2014-02-20: qty 2
  Filled 2014-02-20: qty 1
  Filled 2014-02-20: qty 2
  Filled 2014-02-20: qty 1
  Filled 2014-02-20 (×2): qty 2
  Filled 2014-02-20: qty 1
  Filled 2014-02-20 (×3): qty 2

## 2014-02-20 MED ORDER — MIDAZOLAM HCL 2 MG/2ML IJ SOLN
INTRAMUSCULAR | Status: AC
  Filled 2014-02-20: qty 2

## 2014-02-20 MED ORDER — AMLODIPINE BESYLATE 5 MG PO TABS
5.0000 mg | ORAL_TABLET | Freq: Every day | ORAL | Status: DC
  Administered 2014-02-20 – 2014-02-21 (×2): 5 mg via ORAL
  Filled 2014-02-20 (×3): qty 1

## 2014-02-20 MED ORDER — DIPHENHYDRAMINE HCL 25 MG PO CAPS: 25.0000 mg | ORAL_CAPSULE | Freq: Four times a day (QID) | ORAL | Status: DC | PRN

## 2014-02-20 MED ORDER — MAGNESIUM CITRATE PO SOLN: 1.0000 | Freq: Once | ORAL | Status: AC | PRN

## 2014-02-20 MED ORDER — ASPIRIN 325 MG PO TBEC: 325.0000 mg | DELAYED_RELEASE_TABLET | Freq: Two times a day (BID) | ORAL | Status: AC

## 2014-02-20 MED ORDER — PROPOFOL 10 MG/ML IV BOLUS
INTRAVENOUS | Status: AC
  Filled 2014-02-20: qty 20

## 2014-02-20 MED ORDER — LACTATED RINGERS IV SOLN
INTRAVENOUS | Status: DC
  Administered 2014-02-20 (×2): via INTRAVENOUS
  Administered 2014-02-20: 1000 mL via INTRAVENOUS

## 2014-02-20 MED ORDER — HYDROMORPHONE HCL 1 MG/ML IJ SOLN
0.5000 mg | INTRAMUSCULAR | Status: DC | PRN
  Administered 2014-02-20: 1 mg via INTRAVENOUS
  Filled 2014-02-20: qty 1

## 2014-02-20 MED ORDER — CEFAZOLIN SODIUM-DEXTROSE 2-3 GM-% IV SOLR
INTRAVENOUS | Status: AC
  Filled 2014-02-20: qty 50

## 2014-02-20 MED ORDER — METOCLOPRAMIDE HCL 10 MG PO TABS: 5.0000 mg | ORAL_TABLET | Freq: Three times a day (TID) | ORAL | Status: DC | PRN

## 2014-02-20 MED ORDER — METHOCARBAMOL 1000 MG/10ML IJ SOLN
500.0000 mg | Freq: Four times a day (QID) | INTRAVENOUS | Status: DC | PRN
  Filled 2014-02-20: qty 5

## 2014-02-20 MED ORDER — FERROUS SULFATE 325 (65 FE) MG PO TABS: 325.0000 mg | ORAL_TABLET | Freq: Three times a day (TID) | ORAL | Status: DC

## 2014-02-20 MED ORDER — BUPIVACAINE IN DEXTROSE 0.75-8.25 % IT SOLN
INTRATHECAL | Status: DC | PRN
  Administered 2014-02-20 (×2): 1.8 mL via INTRATHECAL

## 2014-02-20 MED ORDER — TIZANIDINE HCL 4 MG PO TABS: 4.0000 mg | ORAL_TABLET | Freq: Four times a day (QID) | ORAL | Status: DC | PRN

## 2014-02-20 MED ORDER — POLYETHYLENE GLYCOL 3350 17 G PO PACK: 17.0000 g | PACK | Freq: Two times a day (BID) | ORAL | Status: DC

## 2014-02-20 MED ORDER — CELECOXIB 200 MG PO CAPS
200.0000 mg | ORAL_CAPSULE | Freq: Two times a day (BID) | ORAL | Status: DC
  Administered 2014-02-20 – 2014-02-22 (×4): 200 mg via ORAL
  Filled 2014-02-20 (×5): qty 1

## 2014-02-20 MED ORDER — CEFAZOLIN SODIUM-DEXTROSE 2-3 GM-% IV SOLR
2.0000 g | INTRAVENOUS | Status: AC
  Administered 2014-02-20: 2 g via INTRAVENOUS

## 2014-02-20 MED ORDER — CEFAZOLIN SODIUM-DEXTROSE 2-3 GM-% IV SOLR
2.0000 g | Freq: Four times a day (QID) | INTRAVENOUS | Status: AC
  Administered 2014-02-20 – 2014-02-21 (×2): 2 g via INTRAVENOUS
  Filled 2014-02-20 (×2): qty 50

## 2014-02-20 MED ORDER — PHENOL 1.4 % MT LIQD
1.0000 | OROMUCOSAL | Status: DC | PRN
  Filled 2014-02-20: qty 177

## 2014-02-20 MED ORDER — ASPIRIN EC 325 MG PO TBEC
325.0000 mg | DELAYED_RELEASE_TABLET | Freq: Two times a day (BID) | ORAL | Status: DC
  Administered 2014-02-21 – 2014-02-22 (×3): 325 mg via ORAL
  Filled 2014-02-20 (×5): qty 1

## 2014-02-20 MED ORDER — PROPOFOL INFUSION 10 MG/ML OPTIME
INTRAVENOUS | Status: DC | PRN
  Administered 2014-02-20: 100 ug/kg/min via INTRAVENOUS

## 2014-02-20 MED ORDER — ALUM & MAG HYDROXIDE-SIMETH 200-200-20 MG/5ML PO SUSP: 30.0000 mL | ORAL | Status: DC | PRN

## 2014-02-20 SURGICAL SUPPLY — 56 items
BAG ZIPLOCK 12X15 (MISCELLANEOUS) ×2 IMPLANT
BANDAGE ELASTIC 6 VELCRO ST LF (GAUZE/BANDAGES/DRESSINGS) ×2 IMPLANT
BANDAGE ESMARK 6X9 LF (GAUZE/BANDAGES/DRESSINGS) ×1 IMPLANT
BIT DRILL 2.8X128 (BIT) ×2 IMPLANT
BNDG ESMARK 6X9 LF (GAUZE/BANDAGES/DRESSINGS) ×2
CUFF TOURN SGL QUICK 34 (TOURNIQUET CUFF)
CUFF TRNQT CYL 34X4X40X1 (TOURNIQUET CUFF) IMPLANT
DRAPE EXTREMITY T 121X128X90 (DRAPE) ×2 IMPLANT
DRAPE U-SHAPE 47X51 STRL (DRAPES) ×2 IMPLANT
DRSG AQUACEL AG ADV 3.5X10 (GAUZE/BANDAGES/DRESSINGS) ×2 IMPLANT
DRSG TEGADERM 4X4.75 (GAUZE/BANDAGES/DRESSINGS) IMPLANT
DURAPREP 26ML APPLICATOR (WOUND CARE) ×2 IMPLANT
ELECT REM PT RETURN 9FT ADLT (ELECTROSURGICAL) ×2
ELECTRODE REM PT RTRN 9FT ADLT (ELECTROSURGICAL) ×1 IMPLANT
FACESHIELD WRAPAROUND (MASK) ×6 IMPLANT
GAUZE SPONGE 2X2 8PLY STRL LF (GAUZE/BANDAGES/DRESSINGS) IMPLANT
GLOVE BIOGEL PI IND STRL 7.5 (GLOVE) ×1 IMPLANT
GLOVE BIOGEL PI IND STRL 8.5 (GLOVE) ×1 IMPLANT
GLOVE BIOGEL PI INDICATOR 7.5 (GLOVE) ×1
GLOVE BIOGEL PI INDICATOR 8.5 (GLOVE) ×1
GLOVE ECLIPSE 8.0 STRL XLNG CF (GLOVE) IMPLANT
GLOVE ORTHO TXT STRL SZ7.5 (GLOVE) ×4 IMPLANT
GLOVE SURG ORTHO 8.0 STRL STRW (GLOVE) ×2 IMPLANT
GOWN SPEC L3 XXLG W/TWL (GOWN DISPOSABLE) ×4 IMPLANT
GOWN STRL REUS W/TWL LRG LVL3 (GOWN DISPOSABLE) ×2 IMPLANT
IMMOBILIZER KNEE 20 (SOFTGOODS) ×2 IMPLANT
IMMOBILIZER KNEE 20 THIGH 36 (SOFTGOODS) IMPLANT
KIT BASIN OR (CUSTOM PROCEDURE TRAY) ×2 IMPLANT
LIQUID BAND (GAUZE/BANDAGES/DRESSINGS) ×2 IMPLANT
MANIFOLD NEPTUNE II (INSTRUMENTS) ×2 IMPLANT
NDL SAFETY ECLIPSE 18X1.5 (NEEDLE) ×1 IMPLANT
NEEDLE HYPO 18GX1.5 SHARP (NEEDLE) ×1
NEEDLE MA TROC 1/2 CIR (NEEDLE) ×2 IMPLANT
NEEDLE SPNL 18GX3.5 QUINCKE PK (NEEDLE) ×2 IMPLANT
NS IRRIG 1000ML POUR BTL (IV SOLUTION) ×2 IMPLANT
PACK TOTAL JOINT (CUSTOM PROCEDURE TRAY) ×2 IMPLANT
PASSER SUT SWANSON 36MM LOOP (INSTRUMENTS) ×2 IMPLANT
POSITIONER SURGICAL ARM (MISCELLANEOUS) ×2 IMPLANT
SPONGE GAUZE 2X2 STER 10/PKG (GAUZE/BANDAGES/DRESSINGS)
SPONGE LAP 18X18 X RAY DECT (DISPOSABLE) ×2 IMPLANT
STAPLER VISISTAT 35W (STAPLE) IMPLANT
SUT ETHIBOND 2 OS 4 DA (SUTURE) IMPLANT
SUT ETHIBOND 5 LR DA (SUTURE) IMPLANT
SUT FIBERWIRE #2 38 T-5 BLUE (SUTURE) ×4
SUT FIBERWIRE #5 38 BLUE (WIRE) ×4 IMPLANT
SUT MNCRL AB 4-0 PS2 18 (SUTURE) ×2 IMPLANT
SUT VIC AB 0 CT1 27 (SUTURE) ×2
SUT VIC AB 0 CT1 27XBRD ANTBC (SUTURE) ×2 IMPLANT
SUT VIC AB 1 CT1 36 (SUTURE) ×6 IMPLANT
SUT VIC AB 2-0 CT1 27 (SUTURE) ×3
SUT VIC AB 2-0 CT1 TAPERPNT 27 (SUTURE) ×3 IMPLANT
SUTURE FIBERWR #2 38 T-5 BLUE (SUTURE) ×2 IMPLANT
SYR 30ML LL (SYRINGE) ×2 IMPLANT
SYR 50ML LL SCALE MARK (SYRINGE) IMPLANT
TOWEL OR 17X26 10 PK STRL BLUE (TOWEL DISPOSABLE) ×4 IMPLANT
WATER STERILE IRR 1500ML POUR (IV SOLUTION) ×2 IMPLANT

## 2014-02-20 NOTE — Interval H&P Note (Signed)
History and Physical Interval Note:  02/20/2014 4:06 PM  Carl [name]  has presented today for surgery, with the diagnosis of LEFT QUADTENDON RUPTUKRE  The various methods of treatment have been discussed with the patient and family. After consideration of risks, benefits and other options for treatment, the patient has consented to  Procedure(s): LEFT REPAIR QUADRICEP TENDON (Left) as a surgical intervention .  The patient's history has been reviewed, patient examined, no change in status, stable for surgery.  I have reviewed the patient's chart and labs.  Questions were answered to the patient's satisfaction.     Mauri Pole

## 2014-02-20 NOTE — Anesthesia Postprocedure Evaluation (Signed)
  Anesthesia Post-op Note  Patient: Carl [name]  Procedure(s) Performed: Procedure(s) (LRB): LEFT REPAIR QUADRICEP TENDON (Left)  Patient Location: PACU  Anesthesia Type: spinal  Level of Consciousness: awake and alert   Airway and Oxygen Therapy: Patient Spontanous Breathing  Post-op Pain: mild  Post-op Assessment: Post-op Vital signs reviewed, Patient's Cardiovascular Status Stable, Respiratory Function Stable, Patent Airway and No signs of Nausea or vomiting  Last Vitals:  Filed Vitals:   02/20/14 2037  BP: 117/73  Pulse:   Temp: 36.3 C  Resp: 13    Post-op Vital Signs: stable   Complications: No apparent anesthesia complications

## 2014-02-20 NOTE — Anesthesia Procedure Notes (Signed)
Spinal Patient location during procedure: OR Staffing Anesthesiologist: Salley Scarlet Resident/CRNA: Noralyn Pick Performed by: anesthesiologist and resident/CRNA  Preanesthetic Checklist Completed: patient identified, site marked, surgical consent, pre-op evaluation, timeout performed, IV checked, risks and benefits discussed and monitors and equipment checked Spinal Block Patient position: sitting Prep: Betadine Patient monitoring: heart rate, continuous pulse ox and blood pressure Approach: midline Location: L3-4 Injection technique: single-shot Needle Needle type: Spinocan  Needle gauge: 22 G Needle length: 9 cm Additional Notes Expiration date of kit checked and confirmed. Patient tolerated procedure well, without complications. Multiple attempts by [place] without success. Then attempts by [facility] with success. CSF clear. No paresthesias. Meaningful verbal contact maintained throughout spinal placement.

## 2014-02-20 NOTE — Brief Op Note (Signed)
02/20/2014  4:52 PM  PATIENT:  Artis Delay  74 y.o. male  PRE-OPERATIVE DIAGNOSIS:  LEFT QUADRICEP TENDON RUPTURE  POST-OPERATIVE DIAGNOSIS:   LEFT QUADRICEP TENDON RUPTURE  PROCEDURE:  Procedure(s): LEFT REPAIR QUADRICEP TENDON (Left)  SURGEON:  Surgeon(s) and Role:    * Mauri Pole, MD - Primary  PHYSICIAN ASSISTANT: None  ANESTHESIA:   spinal  EBL:   None due to tourniquet use  BLOOD ADMINISTERED:none  DRAINS: none   LOCAL MEDICATIONS USED:  NONE  SPECIMEN:  No Specimen  DISPOSITION OF SPECIMEN:  N/A  COUNTS:  YES  TOURNIQUET:  60 min at 278mmHg  DICTATION: .Other Dictation: Dictation Number [id number]  PLAN OF CARE: Admit to inpatient   PATIENT DISPOSITION:  PACU - hemodynamically stable.   Delay start of Pharmacological VTE agent (>24hrs) due to surgical blood loss or risk of bleeding: no

## 2014-02-20 NOTE — Transfer of Care (Signed)
Immediate Anesthesia Transfer of Care Note  Patient: Carl [name]  Procedure(s) Performed: Procedure(s): LEFT REPAIR QUADRICEP TENDON (Left)  Patient Location: PACU  Anesthesia Type:Regional  Level of Consciousness: awake, alert  and oriented  Airway & Oxygen Therapy: Patient Spontanous Breathing and Patient connected to face mask oxygen  Post-op Assessment: Report given to PACU RN and Post -op Vital signs reviewed and stable  Post vital signs: Reviewed and stable  Complications: No apparent anesthesia complications

## 2014-02-21 ENCOUNTER — Encounter (HOSPITAL_COMMUNITY): Payer: Self-pay | Admitting: Orthopedic Surgery

## 2014-02-21 DIAGNOSIS — M66862 Spontaneous rupture of other tendons, left lower leg: Secondary | ICD-10-CM | POA: Diagnosis not present

## 2014-02-21 LAB — BASIC METABOLIC PANEL
Anion gap: 7 (ref 5–15)
BUN: 15 mg/dL (ref 6–23)
CO2: 26 mmol/L (ref 19–32)
Calcium: 8.7 mg/dL (ref 8.4–10.5)
Chloride: 104 mEq/L (ref 96–112)
Creatinine, Ser: 1.02 mg/dL (ref 0.50–1.35)
GFR calc Af Amer: 81 mL/min — ABNORMAL LOW (ref >90)
GFR calc non Af Amer: 70 mL/min — ABNORMAL LOW (ref >90)
Glucose, Bld: 166 mg/dL — ABNORMAL HIGH (ref 70–99)
Potassium: 4 mmol/L (ref 3.5–5.1)
Sodium: 137 mmol/L (ref 135–145)

## 2014-02-21 LAB — CBC
HCT: 41.6 % (ref 39.0–52.0)
Hemoglobin: 13.8 g/dL (ref 13.0–17.0)
MCH: 31.4 pg (ref 26.0–34.0)
MCHC: 33.2 g/dL (ref 30.0–36.0)
MCV: 94.5 fL (ref 78.0–100.0)
Platelets: 234 10*3/uL (ref 150–400)
RBC: 4.4 MIL/uL (ref 4.22–5.81)
RDW: 12.5 % (ref 11.5–15.5)
WBC: 8.8 10*3/uL (ref 4.0–10.5)

## 2014-02-21 NOTE — Progress Notes (Signed)
     Subjective: 1 Day Post-Op Procedure(s) (LRB): LEFT REPAIR QUADRICEP TENDON (Left)   Patient reports pain as moderate, pain controlled. No events throughout the night. Depending on how well he does with PT, he may be able to [name]/c home later today, if not then tomorrow.  Objective:   VITALS:   Filed Vitals:   02/21/14 0521  BP: 130/73  Pulse: 84  Temp: 98.3 F (36.8 C)  Resp: 16    Dorsiflexion/Plantar flexion intact Incision: dressing C/[name]/I No cellulitis present Compartment soft  LABS  Recent Labs  02/21/14 0455  HGB 13.8  HCT 41.6  WBC 8.8  PLT 234     Recent Labs  02/21/14 0455  NA 137  K 4.0  BUN 15  CREATININE 1.02  GLUCOSE 166*     Assessment/Plan: 1 Day Post-Op Procedure(s) (LRB): LEFT REPAIR QUADRICEP TENDON (Left) Foley cath [name]/c'ed Advance diet Up with therapy [name]/C IV fluids Discharge home with home health  Follow up in 2 weeks at [facility]. Follow up with OLIN,Carl [name] in 2 weeks.  Contact information:  [facility] [address], [address] [place] Carl [name]   PAC  02/21/2014, 8:17 AM

## 2014-02-21 NOTE — Evaluation (Signed)
Occupational Therapy Evaluation Patient Details Name: Carl [name] MRN: [id number] DOB: 1941-01-07 Today's Date: 02/21/2014    History of Present Illness 74 y.o. male admitted for L quadriceps tendon repair.   Clinical Impression   Pt was admitted for the above surgery. At baseline, he is independent with adls. Pt will benefit from skilled OT in acute and follow up at SNF.  He currently needs min A for ADLs and goals are for min guard due to h/o dizziness.      Follow Up Recommendations  SNF    Equipment Recommendations  3 in 1 bedside comode    Recommendations for Other Services       Precautions / Restrictions Precautions Precautions: Fall Required Braces or Orthoses: Knee Immobilizer - Left Knee Immobilizer - Left: On at all times Restrictions Weight Bearing Restrictions: No Other Position/Activity Restrictions: WBAT, no flexion L knee      Mobility Bed Mobility Overal bed mobility: Needs Assistance Bed Mobility: Sit to Supine     Supine to sit: Min assist     General bed mobility comments: assist for LLE  Transfers             General transfer comment: did not stand in OT:  min A with PT earlier    Balance     Sitting balance-Leahy Scale: Good                                     ADL Overall ADL's : Needs assistance/impaired                                       General ADL Comments: Pt needs min A for LB ADLs and set up for UB ADLs.  Pt is able to reach to feet but needs assistance to lift legs.  Pt states he has intermittent dizziness when extending neck:  sounds like BPPV.  Pt states it is not bothering him right now.  Gave him information about [facility] if this is a problem in the future.       Vision                     Perception     Praxis      Pertinent Vitals/Pain Pain Assessment: Faces Pain Score: 4  Faces Pain Scale: Hurts little more Pain Location:  L lateral calf Pain Descriptors / Indicators: Sore Pain Intervention(s): Limited activity within patient's tolerance;Monitored during session;Premedicated before session;Repositioned;Ice applied     Hand Dominance     Extremity/Trunk Assessment Upper Extremity Assessment Upper Extremity Assessment: Overall WFL for tasks assessed (strength grossly 4/5)           Communication Communication Communication: HOH   Cognition Arousal/Alertness: Awake/alert Behavior During Therapy: WFL for tasks assessed/performed Overall Cognitive Status: Within Functional Limits for tasks assessed                     General Comments       Exercises       Shoulder Instructions      Home Living Family/patient expects to be discharged to:: Skilled nursing facility Living Arrangements: Alone  Prior Functioning/Environment Level of Independence: Independent             OT Diagnosis: Generalized weakness;Acute pain   OT Problem List: Decreased strength;Decreased activity tolerance;Decreased knowledge of use of DME or AE;Pain   OT Treatment/Interventions: Self-care/ADL training;DME and/or AE instruction;Patient/family education    OT Goals(Current goals can be found in the care plan section) Acute Rehab OT Goals Patient Stated Goal: return to living at home, return to yoga at gym OT Goal Formulation: With patient Time For Goal Achievement: 02/28/14 Potential to Achieve Goals: Good ADL Goals Pt Will Perform Lower Body Bathing: with min guard assist;sit to/from stand Pt Will Perform Lower Body Dressing: with min guard assist;sit to/from stand Pt Will Transfer to Toilet: with min guard assist;bedside commode;ambulating Pt Will Perform Toileting - Clothing Manipulation and hygiene: with min guard assist;sit to/from stand  OT Frequency: Min 2X/week   Barriers to D/C:            Co-evaluation              End of  Session    Activity Tolerance: Patient tolerated treatment well Patient left: in bed;with call bell/phone within reach   Time: 0569-7948 OT Time Calculation (min): 12 min Charges:  OT General Charges $OT Visit: 1 Procedure OT Evaluation $Initial OT Evaluation Tier I: 1 Procedure G-Codes:    Marda Breidenbach 19-Mar-2014, 3:45 PM Lesle Chris, OTR/L [phone] 03-19-14

## 2014-02-21 NOTE — Progress Notes (Signed)
Clinical Social Work Department BRIEF PSYCHOSOCIAL ASSESSMENT 02/21/2014  Patient:  Carl [name], Carl [name]     Account Number:  [id number]     Admit date:  02/20/2014  Clinical Social Worker:  Lacie Scotts  Date/Time:  02/21/2014 03:15 PM  Referred by:  Physician  Date Referred:  02/21/2014 Referred for  SNF Placement   Other Referral:   Interview type:  Family Other interview type:    PSYCHOSOCIAL DATA Living Status:  ALONE Admitted from facility:   Level of care:   Primary support name:  Tiffani Yeldell Primary support relationship to patient:  CHILD, ADULT Degree of support available:   limited    CURRENT CONCERNS Current Concerns  Post-Acute Placement   Other Concerns:    SOCIAL WORK ASSESSMENT / PLAN Pt is a 74 yr old gentleman living at home, alone, prior to hospitalization. CSW met pt / spoke to daughter to assist with d/c planning. Pt requested csw contact daughter regarding dc planning.PN reviewed. PT is recommending [facility] following hospital d/c. Daughter agrees with this plan. SNF search initiated and bed offers are pending. Pt has [employer]. Authorization is required for SNF placement.   Assessment/plan status:  Psychosocial Support/Ongoing Assessment of Needs Other assessment/ plan:   Information/referral to community resources:   Insurance coverage for SNF and ambulance transport reviewed.    PATIENT'S/FAMILY'S RESPONSE TO PLAN OF CARE: Daughter is concerned with pt being home , alone. She has requested [employer] for [employer]. Daughter is aware placement is pending [facility] authorization.    Roselyn Reef Romain Erion LCSW [phone]

## 2014-02-21 NOTE — Evaluation (Signed)
Physical Therapy Evaluation Patient Details Name: Carl [name] MRN: [id number] DOB: [birth year] Today's Date: 02/21/2014   History of Present Illness  74 y.o. male admitted for L quadriceps tendon repair.  Clinical Impression  *Pt admitted with above diagnosis. Pt currently with functional limitations due to the deficits listed below (see PT Problem List). ** Pt will benefit from skilled PT to increase their independence and safety with mobility to allow discharge to the venue listed below.    Pt requires assist for bed mobility, transfers, and ambulation. He lives alone so would benefit from ST-SNF prior to going home. Today he walked 93' with a RW and min assist.  **    Follow Up Recommendations SNF    Equipment Recommendations  None recommended by PT;3in1 (PT)    Recommendations for Other Services       Precautions / Restrictions Precautions Precautions: Fall Required Braces or Orthoses: Knee Immobilizer - Left Knee Immobilizer - Left: On at all times Restrictions Weight Bearing Restrictions: No Other Position/Activity Restrictions: WBAT, no flexion L knee      Mobility  Bed Mobility Overal bed mobility: Needs Assistance Bed Mobility: Supine to Sit     Supine to sit: Min assist     General bed mobility comments: increased time, cues for technique for RLE assisting LLE, min A to support LLE  Transfers Overall transfer level: Needs assistance Equipment used: Rolling walker (2 wheeled) Transfers: Sit to/from Stand Sit to Stand: From elevated surface;Min assist         General transfer comment: increased time, elevated bed, cues for hand placement  Ambulation/Gait Ambulation/Gait assistance: Min guard Ambulation Distance (Feet): 30 Feet Assistive device: Rolling walker (2 wheeled) Gait Pattern/deviations: Step-to pattern;Decreased step length - left;Antalgic;Trunk flexed   Gait velocity interpretation: Below normal speed for age/gender General Gait  Details: cues for sequencing, min/guard for balance, cues to lift head, pain limiting distance  Stairs            Wheelchair Mobility    Modified Rankin (Stroke Patients Only)       Balance Overall balance assessment: Needs assistance;History of Falls   Sitting balance-Leahy Scale: Good       Standing balance-Leahy Scale: Poor Standing balance comment: pt reports several falls PTA related to vertigo, he reports having had vertigo episodes in the past which seem to occur in the fall, he denies vertigo now with head turns                             Pertinent Vitals/Pain Pain Assessment: 0-10 Pain Score: 3  Pain Location: L knee  Pain Descriptors / Indicators: Sore Pain Intervention(s): Ice applied;Limited activity within patient's tolerance;Monitored during session;Patient requesting pain meds-RN notified    Home Living Family/patient expects to be discharged to:: Private residence Living Arrangements: Alone Available Help at Discharge: Family;Available PRN/intermittently   Home Access: Stairs to enter   Entrance Stairs-Number of Steps: 2 Home Layout: One level Home Equipment: Walker - 2 wheels;Crutches      Prior Function Level of Independence: Independent               Hand Dominance        Extremity/Trunk Assessment   Upper Extremity Assessment: Overall WFL for tasks assessed           Lower Extremity Assessment: LLE deficits/detail   LLE Deficits / Details: SLR 2/5, ankle WNL, L and R great toes "a bit numb"  Cervical / Trunk Assessment: Normal  Communication   Communication: HOH  Cognition Arousal/Alertness: Awake/alert Behavior During Therapy: WFL for tasks assessed/performed Overall Cognitive Status: Within Functional Limits for tasks assessed                      General Comments      Exercises Total Joint Exercises Ankle Circles/Pumps: AROM;Both;10 reps;Seated      Assessment/Plan    PT Assessment  Patient needs continued PT services  PT Diagnosis Difficulty walking;Acute pain   PT Problem List Decreased strength;Decreased activity tolerance;Decreased mobility;Decreased knowledge of use of DME;Pain  PT Treatment Interventions DME instruction;Gait training;Functional mobility training;Therapeutic activities;Stair training;Patient/family education;Therapeutic exercise   PT Goals (Current goals can be found in the Care Plan section) Acute Rehab PT Goals Patient Stated Goal: return to living at home  PT Goal Formulation: With patient Time For Goal Achievement: 03/07/14 Potential to Achieve Goals: Good    Frequency 7X/week   Barriers to discharge Decreased caregiver support pt's daughter can stop in at times but isn't in good health, pt lives alone    Co-evaluation               End of Session Equipment Utilized During Treatment: Gait belt;Left knee immobilizer Activity Tolerance: Patient limited by pain Patient left: in chair;with call bell/phone within reach Nurse Communication: Mobility status         Time: 0913-0949 PT Time Calculation (min) (ACUTE ONLY): 36 min   Charges:   PT Evaluation $Initial PT Evaluation Tier I: 1 Procedure PT Treatments $Gait Training: 8-22 mins $Therapeutic Activity: 8-22 mins   PT G Codes:        Philomena Doheny 02/21/2014, 10:03 AM [phone]

## 2014-02-21 NOTE — Progress Notes (Signed)
Pt's Daughter called this morning wanting to leave a message for the doctor to inform about dizziness the pt was experiencing prior to his fall. Pt's daughter says the pt was having several episodes of dizziness at home days before and would like for it to be addressed if possible. Pt's daughter's number is [phone] [facility] [place])

## 2014-02-21 NOTE — Progress Notes (Signed)
Physical Therapy Treatment Patient Details Name: Carl [name] MRN: [id number] DOB: 1940-05-20 Today's Date: 02/21/2014    History of Present Illness 74 y.o. male admitted for L quadriceps tendon repair.    PT Comments    *Pt progressing well with mobility. He walked 51' with RW. **  Follow Up Recommendations  SNF     Equipment Recommendations  None recommended by PT;3in1 (PT)    Recommendations for Other Services       Precautions / Restrictions Precautions Precautions: Fall Required Braces or Orthoses: Knee Immobilizer - Left Knee Immobilizer - Left: On at all times Restrictions Weight Bearing Restrictions: No Other Position/Activity Restrictions: WBAT, no flexion L knee    Mobility  Bed Mobility Overal bed mobility: Needs Assistance Bed Mobility: Sit to Supine     Supine to sit: Min assist     General bed mobility comments: increased time, cues for technique for RLE assisting LLE, min A to support LLE  Transfers Overall transfer level: Needs assistance Equipment used: Rolling walker (2 wheeled) Transfers: Sit to/from Stand Sit to Stand: From elevated surface;Min assist         General transfer comment: increased time, from elevated surface, cues for hand placement  Ambulation/Gait Ambulation/Gait assistance: Min guard Ambulation Distance (Feet): 70 Feet Assistive device: Rolling walker (2 wheeled) Gait Pattern/deviations: Step-to pattern;Decreased step length - left;Decreased weight shift to left   Gait velocity interpretation: Below normal speed for age/gender General Gait Details: cues for sequencing, min/guard for balance, cues to lift head, pain limiting distance   Stairs            Wheelchair Mobility    Modified Rankin (Stroke Patients Only)       Balance Overall balance assessment: Needs assistance;History of Falls   Sitting balance-Leahy Scale: Good       Standing balance-Leahy Scale: Poor Standing balance comment:  pt reports several falls PTA related to vertigo, he reports having had vertigo episodes in the past which seem to occur in the fall, he denies vertigo now with head turns                    Cognition Arousal/Alertness: Awake/alert Behavior During Therapy: WFL for tasks assessed/performed Overall Cognitive Status: Within Functional Limits for tasks assessed                      Exercises Total Joint Exercises Ankle Circles/Pumps: AROM;Both;10 reps;Seated    General Comments        Pertinent Vitals/Pain Pain Assessment: 0-10 Pain Score: 4  Pain Location: L knee Pain Descriptors / Indicators: Sore Pain Intervention(s): Limited activity within patient's tolerance;Ice applied;Monitored during session;Premedicated before session    Home Living Family/patient expects to be discharged to:: Private residence Living Arrangements: Alone Available Help at Discharge: Family;Available PRN/intermittently   Home Access: Stairs to enter   Home Layout: One level Home Equipment: Environmental consultant - 2 wheels;Crutches      Prior Function Level of Independence: Independent          PT Goals (current goals can now be found in the care plan section) Acute Rehab PT Goals Patient Stated Goal: return to living at home, return to yoga at gym PT Goal Formulation: With patient Time For Goal Achievement: 03/07/14 Potential to Achieve Goals: Good Progress towards PT goals: Progressing toward goals    Frequency  7X/week    PT Plan Current plan remains appropriate    Co-evaluation  End of Session Equipment Utilized During Treatment: Gait belt;Left knee immobilizer Activity Tolerance: Patient limited by pain Patient left: with call bell/phone within reach;in bed     Time: 9150-5697 PT Time Calculation (min) (ACUTE ONLY): 18 min  Charges:  $Gait Training: 8-22 mins $Therapeutic Activity: 8-22 mins                    G Codes:      Philomena Doheny 02/21/2014, 12:33 PM [phone]

## 2014-02-21 NOTE — Op Note (Signed)
NAMEMarland [name]  ARON, INGE NO.:  [id number]  MEDICAL RECORD NO.:  [id number]  LOCATION:  14                         FACILITY:  [facility]  PHYSICIAN:  Pietro Cassis. Alvan Dame, M.D.  DATE OF BIRTH:  1940/03/30  DATE OF PROCEDURE:  02/20/2014 DATE OF DISCHARGE:                              OPERATIVE REPORT   PREOPERATIVE DIAGNOSIS:  Left quadriceps tendon rupture.  POSTOPERATIVE DIAGNOSIS:  Left quadriceps tendon rupture.  FINDINGS:  The patient was noted to have quadriceps tendon disruption as well as a medial and lateral retinacular disruption.  Tissues were noted to be fairly degenerative in nature with near complete avulsion of the superior pole of the patella.  PROCEDURE:  Open repair of the left quadriceps tendon rupture utilizing #2 FiberWire suture.  SURGEON:  Pietro Cassis. Alvan Dame, M.D.  ASSISTANT:  Surgical Team.  ANESTHESIA:  Spinal block.  SPECIMENS:  None.  COMPLICATIONS:  None.  DRAINS:  None.  ESTIMATED BLOOD LOSS:  Minimal.  TOURNIQUET:  Tourniquet was utilized at 250 mmHg for an entire 60-minute procedure.  INDICATIONS FOR PROCEDURE:  Carl [name] is a 74 year old male who, on February 10, 2014, slipped and fell at home.  He was initially seen and evaluated in Urgent Care and sent to our office for followup evaluation. He had obvious palpable defect.  No further studies were necessary.  He was set up for surgery.  After reviewing the risks, benefits, necessity of the procedure, specific risk of infection, risk of DVT, risk of re- rupture of the tendon were all discussed and reviewed.  Postoperative course was outlined with him.  Consent was obtained for benefit of improved extension mechanism function.  PROCEDURE IN DETAIL:  The patient was brought to the operative theater. Once adequate anesthesia, preoperative antibiotics, Ancef administered, he was positioned supine with a left thigh tourniquet placed.  The left lower extremity was then prepped  and draped in a sterile fashion.  Time- out was performed, identifying the patient, planned procedure, and the extremity.  Leg was exsanguinated, tourniquet elevated to 250 mmHg.  Midline incision was made followed by soft tissue dissection.  Once I had the planes created, I did open up the peritendinous tissue which revealed a very large knee effusion.  This was old blood, synovial reaction accordingly.  Once this paratenon was opened, I identified the end of the tendon as well as noted the avulsion injury, basically denuding the proximal pole of the patella.  At this point, I used a rongeur to remove bone off the end of the patella to allow for bony ends for the repaired tendon back down to bone.  I then removed nonviable appearing degenerative tendinous tissue at the distal end of the quadriceps tendon.  Once this was completed, I passed #2 FiberWire sutures in a parallel Krackow fashion into the distal aspect of the tendon with 4 ends of tendon coming out the distal end of the tendon.  I then created 3 drill parallel holes into the patella and then passed the medial and lateral sutures by themselves through the medial and lateral drill holes respectively using a Keith needle and then the 2 central sutures through the central hole again with a Lanny Hurst needle.  Using  a bone tenaculum with assistance, applying pressure proximally.  I then sutured the medial sutures together and then lateral sutures together and then the 2 suture ends together centrally.  Once this was carried out and the tendon had direct apposition with the bone, I used #1 Vicryl sutures to reapproximate the medial and lateral retinacular tissues, creating a near water tight seal.  I then reapproximated the peritendinous tissue over top of the suture line with a #1 Vicryl as well.  Vertical incision made in the distal in the patella tendon for suture retrieval was also reapproximated.  At this point, the remaining of the  wound was closed with 2-0 Vicryl and then running 4-0 Monocryl.  The knee was cleaned, dried, and dressed sterilely using surgical glue and Aquacel dressing. He was then brought to the recovery room in stable condition with a knee immobilizer placed over ice bag.  Plan will be further outlined through the office.     Pietro Cassis Alvan Dame, M.D.     MDO/MEDQ  D:  02/20/2014  T:  02/21/2014  Job:  [id number]

## 2014-02-21 NOTE — Progress Notes (Signed)
Clinical Social Work Department CLINICAL SOCIAL WORK PLACEMENT NOTE 02/21/2014  Patient:  Carl [name], Carl [name]  Account Number:  [id number] Admit date:  02/20/2014  Clinical Social Worker:  Werner Lean, LCSW  Date/time:  02/21/2014 03:28 PM  Clinical Social Work is seeking post-discharge placement for this patient at the following level of care:   SKILLED NURSING   (*CSW will update this form in Epic as items are completed)     Patient/family provided with [place] Department of Clinical Social Work's list of facilities offering this level of care within the geographic area requested by the patient (or if unable, by the patient's family).  02/21/2014  Patient/family informed of their freedom to choose among providers that offer the needed level of care, that participate in Medicare, Medicaid or managed care program needed by the patient, have an available bed and are willing to accept the patient.  02/21/2014  Patient/family informed of [facility]' ownership interest in [facility], as well as of the fact that they are under no obligation to receive care at this facility.  PASARR submitted to EDS on 02/21/2014 PASARR number received on 02/21/2014  FL2 transmitted to all facilities in geographic area requested by pt/family on  02/21/2014 FL2 transmitted to all facilities within larger geographic area on   Patient informed that his/her managed care company has contracts with or will negotiate with  certain facilities, including the following:     Patient/family informed of bed offers received:   Patient chooses bed at  Physician recommends and patient chooses bed at    Patient to be transferred to  on   Patient to be transferred to facility by  Patient and family notified of transfer on  Name of family member notified:    The following physician request were entered in Epic:   Additional Comments:  Werner Lean LCSW [phone]

## 2014-02-22 DIAGNOSIS — S76192D Other specified injury of left quadriceps muscle, fascia and tendon, subsequent encounter: Secondary | ICD-10-CM | POA: Diagnosis not present

## 2014-02-22 DIAGNOSIS — R278 Other lack of coordination: Secondary | ICD-10-CM | POA: Diagnosis not present

## 2014-02-22 DIAGNOSIS — R2681 Unsteadiness on feet: Secondary | ICD-10-CM | POA: Diagnosis not present

## 2014-02-22 DIAGNOSIS — E785 Hyperlipidemia, unspecified: Secondary | ICD-10-CM | POA: Diagnosis not present

## 2014-02-22 DIAGNOSIS — L03116 Cellulitis of left lower limb: Secondary | ICD-10-CM | POA: Diagnosis not present

## 2014-02-22 DIAGNOSIS — M66862 Spontaneous rupture of other tendons, left lower leg: Secondary | ICD-10-CM | POA: Diagnosis not present

## 2014-02-22 DIAGNOSIS — D509 Iron deficiency anemia, unspecified: Secondary | ICD-10-CM | POA: Diagnosis not present

## 2014-02-22 DIAGNOSIS — M628 Other specified disorders of muscle: Secondary | ICD-10-CM | POA: Diagnosis not present

## 2014-02-22 DIAGNOSIS — R531 Weakness: Secondary | ICD-10-CM | POA: Diagnosis not present

## 2014-02-22 DIAGNOSIS — M1990 Unspecified osteoarthritis, unspecified site: Secondary | ICD-10-CM | POA: Diagnosis not present

## 2014-02-22 LAB — CBC
HCT: 39.4 % (ref 39.0–52.0)
Hemoglobin: 12.7 g/dL — ABNORMAL LOW (ref 13.0–17.0)
MCH: 30.8 pg (ref 26.0–34.0)
MCHC: 32.2 g/dL (ref 30.0–36.0)
MCV: 95.6 fL (ref 78.0–100.0)
Platelets: 225 10*3/uL (ref 150–400)
RBC: 4.12 MIL/uL — ABNORMAL LOW (ref 4.22–5.81)
RDW: 12.6 % (ref 11.5–15.5)
WBC: 19.2 10*3/uL — ABNORMAL HIGH (ref 4.0–10.5)

## 2014-02-22 LAB — BASIC METABOLIC PANEL
Anion gap: 7 (ref 5–15)
BUN: 19 mg/dL (ref 6–23)
CO2: 28 mmol/L (ref 19–32)
Calcium: 8.7 mg/dL (ref 8.4–10.5)
Chloride: 105 mEq/L (ref 96–112)
Creatinine, Ser: 0.97 mg/dL (ref 0.50–1.35)
GFR calc Af Amer: >90 mL/min (ref >90)
GFR calc non Af Amer: 79 mL/min — ABNORMAL LOW (ref >90)
Glucose, Bld: 125 mg/dL — ABNORMAL HIGH (ref 70–99)
Potassium: 4.2 mmol/L (ref 3.5–5.1)
Sodium: 140 mmol/L (ref 135–145)

## 2014-02-22 NOTE — Progress Notes (Signed)
Physical Therapy Treatment Patient Details Name: Carl [name] MRN: [id number] DOB: 07/17/1940 Today's Date: 02/22/2014    History of Present Illness 74 y.o. male admitted for L quadriceps tendon repair.    PT Comments    *Pt progressing well with mobility. Instructed pt in LLE strengthening exercises.  REady to DC to rehab from PT standpoint. **  Follow Up Recommendations  SNF     Equipment Recommendations  None recommended by PT;3in1 (PT)    Recommendations for Other Services       Precautions / Restrictions Precautions Precautions: Fall Required Braces or Orthoses: Other Brace/Splint (Knee extension brace) Knee Immobilizer - Left: On at all times Restrictions Other Position/Activity Restrictions: WBAT, [name] flexion L knee    Mobility  Bed Mobility Overal bed mobility: Modified Independent Bed Mobility: Supine to Sit     Supine to sit: Modified independent (Device/Increase time)     General bed mobility comments: increased time, self assisted LLE with RLE  Transfers Overall transfer level: Needs assistance Equipment used: Rolling walker (2 wheeled) Transfers: Sit to/from Stand Sit to Stand: From elevated surface;Min guard         General transfer comment: increased time, from elevated surface, good hand placement  Ambulation/Gait Ambulation/Gait assistance: Supervision Ambulation Distance (Feet): 200 Feet Assistive device: Rolling walker (2 wheeled) Gait Pattern/deviations: Step-to pattern   Gait velocity interpretation: Below normal speed for age/gender General Gait Details: good sequencing, min/guard for balance, cues to lift head   Stairs            Wheelchair Mobility    Modified Rankin (Stroke Patients Only)       Balance     Sitting balance-Leahy Scale: Good       Standing balance-Leahy Scale: Poor                      Cognition Arousal/Alertness: Awake/alert Behavior During Therapy: WFL for tasks  assessed/performed Overall Cognitive Status: Within Functional Limits for tasks assessed                      Exercises Total Joint Exercises Ankle Circles/Pumps: AROM;Both;10 reps;Supine Quad Sets: AROM;Left;10 reps;Supine Hip ABduction/ADduction: AAROM;Left;10 reps;Supine Straight Leg Raises: AAROM;Left;10 reps;Supine    General Comments        Pertinent Vitals/Pain Pain Score: 2  Pain Location: L knee Pain Descriptors / Indicators: Sore Pain Intervention(s): Premedicated before session;Ice applied;Limited activity within patient's tolerance;Monitored during session    Home Living                      Prior Function            PT Goals (current goals can now be found in the care plan section) Acute Rehab PT Goals Patient Stated Goal: return to living at home, return to yoga at gym PT Goal Formulation: With patient Time For Goal Achievement: 03/07/14 Potential to Achieve Goals: Good Progress towards PT goals: Progressing toward goals    Frequency  7X/week    PT Plan Current plan remains appropriate    Co-evaluation             End of Session Equipment Utilized During Treatment: Gait belt;Left knee immobilizer Activity Tolerance: Patient tolerated treatment well Patient left: with call bell/phone within reach;in bed     Time: 8309-4076 PT Time Calculation (min) (ACUTE ONLY): 27 min  Charges:  $Gait Training: 8-22 mins $Therapeutic Activity: 8-22 mins  G Codes:  Functional Assessment Tool Used: clinical judgement Functional Limitation: Mobility: Walking and moving around Mobility: Walking and Moving Around Current Status [phone]): At least 1 percent but less than 20 percent impaired, limited or restricted Mobility: Walking and Moving Around Goal Status [phone]): 0 percent impaired, limited or restricted   Philomena Doheny 02/22/2014, 12:11 PM [phone]

## 2014-02-22 NOTE — Discharge Summary (Signed)
Physician Discharge Summary  Patient ID: JES COSTALES MRN: [id number] DOB/AGE: 76/27/1942 76 y.o.  Admit date: 02/20/2014 Discharge date:  02/22/2014  Procedures:  Procedure(s) (LRB): LEFT REPAIR QUADRICEP TENDON (Left)  Attending Physician:  Dr. Paralee Cancel   Admission Diagnoses:   Left quad tendon rupture  Discharge Diagnoses:  Principal Problem:   Left quad muscle rupture  Past Medical History  Diagnosis Date  . Hypertension   . Hyperlipidemia   . Arthritis   . Quadriceps tendon rupture     LEFT    HPI: Pt is a 74 y.o. male complaining of left knee pain and inability to extend his left knee. He states he thinks he may be tripped on some furniture while getting ready for bed early on the morning on 1/2/016. Denies head injury or loss of consciousness as well as anticoagulant use. He states he has been unable to ambulate, and cannot lift his left leg up off the bed. Physical exam in the ER is consistent with quadriceps tendon rupture. Patient placed in a left knee immobilizer, given crutches and is able to ambulate slowly. Referred to ortho. Patient presented in the clinic and Dr. Alvan Dame discussed various options. He wishes to proceed with a repair of the left quad tendon. Risks, benefits and expectations were discussed with the patient. Patient understand the risks, benefits and expectations and wishes to proceed with surgery.   PCP:  Melinda Crutch, MD   Discharged Condition: good  Hospital Course:  Patient underwent the above stated procedure on 02/20/2014. Patient tolerated the procedure well and brought to the recovery room in good condition and subsequently to the floor.  POD #1 BP: 130/73 ; Pulse: 84 ; Temp: 98.3 F (36.8 C) ; Resp: 16 Patient reports pain as moderate, pain controlled. No events throughout the night.  Concerned regarding getting around at home, since he lives alone and not doing well with PT. Dorsiflexion/plantar flexion intact, incision:  dressing C/D/I, no cellulitis present and compartment soft.   LABS  Basename    HGB  13.8  HCT  41.6   POD #2  BP: 109/69 ; Pulse: 86 ; Temp: 98.4 F (36.9 C) ; Resp: 18 Patient reports pain as mild. Doing fine, no events. Working on D/C disposition perhaps to [facility] for rehab due to living alone Dorsiflexion/plantar flexion intact, incision: dressing C/D/I, no cellulitis present and compartment soft.   LABS  Basename    HGB  12.7  HCT  39.4    Discharge Exam: General appearance: alert, cooperative and no distress Extremities: Homans sign is negative, no sign of DVT, no edema, redness or tenderness in the calves or thighs and no ulcers, gangrene or trophic changes  Disposition: Home with follow up in 2 weeks   Follow-up Information    Follow up with Mauri Pole, MD. Schedule an appointment as soon as possible for a visit in 2 weeks.   Specialty:  Orthopedic Surgery   Contact information:   [address] [place] [address] [phone]       Follow up with Mauri Pole, MD. Schedule an appointment as soon as possible for a visit in 2 weeks.   Specialty:  Orthopedic Surgery   Contact information:   [address] [place] [address] [phone]       Discharge Instructions    Call MD / Call 911    Complete by:  As directed   If you experience chest pain or shortness of  breath, CALL 911 and be transported to the hospital emergency room.  If you develope a fever above 101 F, pus (white drainage) or increased drainage or redness at the wound, or calf pain, call your surgeon's office.     Constipation Prevention    Complete by:  As directed   Drink plenty of fluids.  Prune juice may be helpful.  You may use a stool softener, such as Colace (over the counter) 100 mg twice a day.  Use MiraLax (over the counter) for constipation as needed.     Diet - low sodium heart healthy    Complete by:  As directed      Discharge  instructions    Complete by:  As directed   Maintain surgical dressing until follow up in the clinic. If the edges start to pull up, may reinforce with tape. If the dressing is no longer working, may remove and cover with gauze and tape, but must keep the area dry and clean.  Follow up in 2 weeks at [facility]. Call with any questions or concerns.     Driving restrictions    Complete by:  As directed   No driving for 4 weeks     Increase activity slowly as tolerated    Complete by:  As directed      Weight bearing as tolerated    Complete by:  As directed   With T-ROM brace in place and locked in extension.  Laterality:  left  Extremity:  Lower             Medication List    STOP taking these medications        acetaminophen 500 MG tablet  Commonly known as:  TYLENOL      TAKE these medications        amLODipine 5 MG tablet  Commonly known as:  NORVASC  Take 5 mg by mouth at bedtime.     aspirin 325 MG EC tablet  Take 1 tablet (325 mg total) by mouth 2 (two) times daily.     DSS 100 MG Caps  Take 100 mg by mouth 2 (two) times daily.     ferrous sulfate 325 (65 FE) MG tablet  Take 1 tablet (325 mg total) by mouth 3 (three) times daily after meals.     HYDROcodone-acetaminophen 7.5-325 MG per tablet  Commonly known as:  NORCO  Take 1-2 tablets by mouth every 4 (four) hours as needed for moderate pain.     multivitamin with minerals Tabs tablet  Take 1 tablet by mouth daily.     polyethylene glycol packet  Commonly known as:  MIRALAX / GLYCOLAX  Take 17 g by mouth 2 (two) times daily.     STUDY MEDICATION  Take 1 tablet by mouth daily. Through [place] is either getting vitamin d or placebo. Pt unsure of strength of medication     SYSTANE OP  Apply 1 drop to eye 3 (three) times daily as needed (Dry eyes).     tiZANidine 4 MG tablet  Commonly known as:  ZANAFLEX  Take 1 tablet (4 mg total) by mouth every 6 (six) hours as needed for muscle  spasms.         Signed: [place] Pugh. Charlotte Fidalgo   PA-C  02/22/2014, 11:15 AM

## 2014-02-22 NOTE — Progress Notes (Signed)
Called report to [facility]. 

## 2014-02-22 NOTE — Progress Notes (Signed)
Discharge instructions reviewed with pt and daughter. Both verbalized understanding. LLE is in T-ROM brace and ace wrap is clean and dry. Pt reports no pain at this time. Pt and daughter have no further questions or concerns.

## 2014-02-22 NOTE — Progress Notes (Signed)
TROM brace in place

## 2014-02-22 NOTE — Care Management Note (Signed)
    Page 1 of 1   02/22/2014     2:12:22 PM CARE MANAGEMENT NOTE 02/22/2014  Patient:  Carl [name], Carl [name]   Account Number:  [id number]  Date Initiated:  02/21/2014  Documentation initiated by:  Sunday Spillers  Subjective/Objective Assessment:   74 yo male admitted s/p quadricept tendon repair. PTA lived at home alone.     Action/Plan:   Home vs SNF   Anticipated DC Date:  02/22/2014   Anticipated DC Plan:  SKILLED NURSING FACILITY  In-house referral  Clinical Social Worker      DC Planning Services  CM consult      Choice offered to / List presented to:             [place]   Status of service:  Completed, signed off Medicare Important Message given?   (If response is "NO", the following Medicare IM given date fields will be blank) Date Medicare IM given:   Medicare IM given by:   Date Additional Medicare IM given:   Additional Medicare IM given by:    Discharge Disposition:  [place]  Per UR Regulation:  Reviewed for med. necessity/level of care/duration of stay  If discussed at [place] of Stay Meetings, dates discussed:    Comments:

## 2014-02-22 NOTE — Progress Notes (Signed)
Called [employer] for T-ROM brace

## 2014-02-22 NOTE — Progress Notes (Signed)
Clinical Social Work Department CLINICAL SOCIAL WORK PLACEMENT NOTE 02/22/2014  Patient:  Carl [name], Carl [name]  Account Number:  [id number] Admit date:  02/20/2014  Clinical Social Worker:  Werner Lean, LCSW  Date/time:  02/21/2014 03:28 PM  Clinical Social Work is seeking post-discharge placement for this patient at the following level of care:   SKILLED NURSING   (*CSW will update this form in Epic as items are completed)     Patient/family provided with [facility] Department of Clinical Social Work's list of facilities offering this level of care within the geographic area requested by the patient (or if unable, by the patient's family).  02/21/2014  Patient/family informed of their freedom to choose among providers that offer the needed level of care, that participate in Medicare, Medicaid or managed care program needed by the patient, have an available bed and are willing to accept the patient.  02/21/2014  Patient/family informed of [facility]' ownership interest in [facility], as well as of the fact that they are under no obligation to receive care at this facility.  PASARR submitted to EDS on 02/21/2014 PASARR number received on 02/21/2014  FL2 transmitted to all facilities in geographic area requested by pt/family on  02/21/2014 FL2 transmitted to all facilities within larger geographic area on   Patient informed that his/her managed care company has contracts with or will negotiate with  certain facilities, including the following:     Patient/family informed of bed offers received:  02/21/2014 Patient chooses bed at [facility]ician recommends and patient chooses bed at    Patient to be transferred to [place] on  02/22/2014 Patient to be transferred to facility by [facility] Patient and family notified of transfer on 02/22/2014 Name of family member notified:  DAUGHTER  The following physician request were entered in Epic:   Additional  Comments: PT / daughter are in agreement with d/c to SNF today. [employer] provided authorization. Pt requires [facility] transport ( recommended by PT ). NSG reviewed d/c summary, avs, scripots. Scripts included in d/c packet.  Purcell Nails LCSW [phone]

## 2014-02-22 NOTE — Progress Notes (Signed)
Patient ID: Carl [name], male   DOB: [birth year], 74 y.o.   MRN: [id number] Subjective: 2 Days Post-Op Procedure(s) (LRB): LEFT REPAIR QUADRICEP TENDON (Left)    Patient reports pain as mild.  Doing fine, no events.  Working on D/C disposition perhaps to [facility] for rehab due to living alone  Objective:   VITALS:   Filed Vitals:   02/22/14 0603  BP: 109/69  Pulse: 86  Temp: 98.4 F (36.9 C)  Resp: 18    Neurovascular intact Incision: dressing C/D/I  LABS  Recent Labs  02/21/14 0455 02/22/14 0510  HGB 13.8 12.7*  HCT 41.6 39.4  WBC 8.8 19.2*  PLT 234 225     Recent Labs  02/21/14 0455 02/22/14 0510  NA 137 140  K 4.0 4.2  BUN 15 19  CREATININE 1.02 0.97  GLUCOSE 166* 125*    No results for input(s): LABPT, INR in the last 72 hours.   Assessment/Plan: 2 Days Post-Op Procedure(s) (LRB): LEFT REPAIR QUADRICEP TENDON (Left)   Up with therapy Discharge to SNF when available Will order T-ROM brace locked in extension Otherwise WBAT LLE

## 2014-02-22 NOTE — Progress Notes (Signed)
Physical Therapy Treatment Patient Details Name: Carl [name] MRN: [id number] DOB: 1940/11/21 Today's Date: 02/22/2014    History of Present Illness 74 y.o. male admitted for L quadriceps tendon repair.    PT Comments    **Pt progressing with mobility. Overall increased gait distance, however continues to require assistance for transfers. He is being fitted with knee extension brace. *  Follow Up Recommendations  SNF     Equipment Recommendations  None recommended by PT;3in1 (PT)    Recommendations for Other Services       Precautions / Restrictions Precautions Precautions: Fall Required Braces or Orthoses: Knee Immobilizer - Left Knee Immobilizer - Left: On at all times Restrictions Other Position/Activity Restrictions: WBAT, no flexion L knee    Mobility  Bed Mobility Overal bed mobility: Modified Independent Bed Mobility: Supine to Sit     Supine to sit: Modified independent (Device/Increase time)     General bed mobility comments: increased time, self assisted LLE with RLE  Transfers Overall transfer level: Needs assistance Equipment used: Rolling walker (2 wheeled) Transfers: Sit to/from Stand Sit to Stand: From elevated surface;Min assist         General transfer comment: increased time, from elevated surface, good hand placement  Ambulation/Gait Ambulation/Gait assistance: Min guard Ambulation Distance (Feet): 130 Feet Assistive device: Rolling walker (2 wheeled) Gait Pattern/deviations: Step-to pattern   Gait velocity interpretation: Below normal speed for age/gender General Gait Details: good sequencing, min/guard for balance, cues to lift head   Stairs            Wheelchair Mobility    Modified Rankin (Stroke Patients Only)       Balance     Sitting balance-Leahy Scale: Good       Standing balance-Leahy Scale: Poor                      Cognition Arousal/Alertness: Awake/alert Behavior During Therapy: WFL  for tasks assessed/performed Overall Cognitive Status: Within Functional Limits for tasks assessed                      Exercises      General Comments        Pertinent Vitals/Pain Pain Score: 3  Pain Location: L knee Pain Descriptors / Indicators: Sore Pain Intervention(s): Limited activity within patient's tolerance;Monitored during session;Ice applied;Patient requesting pain meds-RN notified    Home Living                      Prior Function            PT Goals (current goals can now be found in the care plan section) Acute Rehab PT Goals Patient Stated Goal: return to living at home, return to yoga at gym PT Goal Formulation: With patient Time For Goal Achievement: 03/07/14 Potential to Achieve Goals: Good Progress towards PT goals: Progressing toward goals    Frequency  7X/week    PT Plan Current plan remains appropriate    Co-evaluation             End of Session Equipment Utilized During Treatment: Gait belt;Left knee immobilizer Activity Tolerance: Patient tolerated treatment well Patient left: with call bell/phone within reach;in bed     Time: 0912-0940 PT Time Calculation (min) (ACUTE ONLY): 28 min  Charges:  $Gait Training: 8-22 mins $Therapeutic Activity: 8-22 mins  G Codes:  Functional Assessment Tool Used: clinical judgement Functional Limitation: Mobility: Walking and moving around Mobility: Walking and Moving Around Current Status [phone]): At least 1 percent but less than 20 percent impaired, limited or restricted Mobility: Walking and Moving Around Goal Status [phone]): 0 percent impaired, limited or restricted   Philomena Doheny 02/22/2014, 9:54 AM [phone]

## 2014-02-23 ENCOUNTER — Encounter: Payer: Self-pay | Admitting: Internal Medicine

## 2014-02-23 ENCOUNTER — Non-Acute Institutional Stay (SKILLED_NURSING_FACILITY): Payer: Medicare PPO | Admitting: Internal Medicine

## 2014-02-23 DIAGNOSIS — K5900 Constipation, unspecified: Secondary | ICD-10-CM

## 2014-02-23 DIAGNOSIS — I10 Essential (primary) hypertension: Secondary | ICD-10-CM

## 2014-02-23 DIAGNOSIS — L03116 Cellulitis of left lower limb: Secondary | ICD-10-CM

## 2014-02-23 DIAGNOSIS — R531 Weakness: Secondary | ICD-10-CM

## 2014-02-23 DIAGNOSIS — D509 Iron deficiency anemia, unspecified: Secondary | ICD-10-CM

## 2014-02-23 DIAGNOSIS — S76112S Strain of left quadriceps muscle, fascia and tendon, sequela: Secondary | ICD-10-CM

## 2014-02-23 DIAGNOSIS — D72829 Elevated white blood cell count, unspecified: Secondary | ICD-10-CM

## 2014-02-23 NOTE — Progress Notes (Signed)
Patient ID: Carl [name], male   DOB: [birth year], 74 y.o.   MRN: [id number]      PCP:  Melinda Crutch, MD  Code Status: full code  No Known Allergies  Chief Complaint  Patient presents with  . New Admit To SNF     HPI:  74 year old patient is here for short term rehabilitation post hospital admission from 02/20/13-02/22/13 with left quad tendon rupture. He underwent repair of the tendon. He is seen in his room today. He mentions that his pain is under control. On review, his bp reading is elevated. He had a bowel movement today. He is working with therapy team. He has muscle tightness. No other complaints  Review of Systems:  Constitutional: Negative for fever, chills, diaphoresis.  HENT: Negative for headache, congestion Eyes: Negative for blurred vision, double vision and discharge.  Respiratory: Negative for cough, shortness of breath and wheezing.   Cardiovascular: Negative for chest pain, palpitations, leg swelling.  Gastrointestinal: Negative for heartburn, nausea, vomiting, abdominal pain Genitourinary: Negative for dysuria Musculoskeletal: Negative for back pain, falls Skin: Negative for itching, rash.  Neurological: Negative for dizziness, tingling, focal weakness Psychiatric/Behavioral: Negative for depression, anxiety, insomnia and memory loss.    Past Medical History  Diagnosis Date  . Hypertension   . Hyperlipidemia   . Arthritis   . Quadriceps tendon rupture     LEFT   Past Surgical History  Procedure Laterality Date  . Throat surgery  2010  . Quadriceps tendon repair Left 02/20/2014    Procedure: LEFT REPAIR QUADRICEP TENDON;  Surgeon: Mauri Pole, MD;  Location: [facility];  Service: Orthopedics;  Laterality: Left;   Social History:   reports that he has never smoked. He does not have any smokeless tobacco history on file. He reports that he drinks alcohol. He reports that he does not use illicit drugs.  History reviewed. No pertinent family  history.  Medications: Patient's Medications  New Prescriptions   No medications on file  Previous Medications   AMLODIPINE (NORVASC) 5 MG TABLET    Take 5 mg by mouth at bedtime.    ASPIRIN EC 325 MG EC TABLET    Take 1 tablet (325 mg total) by mouth 2 (two) times daily.   DOCUSATE SODIUM 100 MG CAPS    Take 100 mg by mouth 2 (two) times daily.   FERROUS SULFATE 325 (65 FE) MG TABLET    Take 1 tablet (325 mg total) by mouth 3 (three) times daily after meals.   HYDROCODONE-ACETAMINOPHEN (NORCO) 7.5-325 MG PER TABLET    Take 1-2 tablets by mouth every 4 (four) hours as needed for moderate pain.   MULTIPLE VITAMIN (MULTIVITAMIN WITH MINERALS) TABS TABLET    Take 1 tablet by mouth daily.   POLYETHYL GLYCOL-PROPYL GLYCOL (SYSTANE OP)    Apply 1 drop to eye 3 (three) times daily as needed (Dry eyes).   POLYETHYLENE GLYCOL (MIRALAX / GLYCOLAX) PACKET    Take 17 g by mouth 2 (two) times daily.   STUDY MEDICATION    Take 1 tablet by mouth daily. Through [place] is either getting vitamin d or placebo. Pt unsure of strength of medication   TIZANIDINE (ZANAFLEX) 4 MG TABLET    Take 1 tablet (4 mg total) by mouth every 6 (six) hours as needed for muscle spasms.  Modified Medications   No medications on file  Discontinued Medications   No medications on file     Physical Exam: Danley Danker  Vitals:   02/23/14 1524  BP: 150/84  Pulse: 79  Temp: 98.5 F (36.9 C)  Resp: 18  SpO2: 95%    General- elderly male, well built, in no acute distress Head- normocephalic, atraumatic Throat- moist mucus membrane Eyes- PERRLA, EOMI, no pallor, no icterus, no discharge, normal conjunctiva, normal sclera Neck- no cervical lymphadenopathy Cardiovascular- normal s1,s2, no murmurs, palpable dorsalis pedis and radial pulses, trace left leg edema Respiratory- bilateral clear to auscultation, no wheeze, no rhonchi, no crackles, no use of accessory muscles Abdomen- bowel sounds present, soft, non  tender Musculoskeletal- able to move all 4 extremities, left leg ROM limited, on immobilizer Skin- warm and dry, aquacel dressing on left knee, erythema and warmth present Psychiatry- alert and oriented to person, place and time, normal mood and affect    Labs reviewed: Basic Metabolic Panel:  Recent Labs  02/16/14 1100 02/21/14 0455 02/22/14 0510  NA 138 137 140  K 3.9 4.0 4.2  CL 110 104 105  CO2 25 26 28   GLUCOSE 103* 166* 125*  BUN 18 15 19   CREATININE 1.11 1.02 0.97  CALCIUM 8.6 8.7 8.7   CBC:  Recent Labs  02/16/14 1100 02/21/14 0455 02/22/14 0510  WBC 6.0 8.8 19.2*  HGB 14.2 13.8 12.7*  HCT 43.0 41.6 39.4  MCV 94.1 94.5 95.6  PLT 187 234 225    Assessment/Plan  Generalized weakness Will have patient work with PT/OT as tolerated to regain strength and restore function.  Fall precautions are in place.  Leukocytosis Possible infection given the erythema and warmth in left knee on exam. See below.  Left leg cellulitis With erythema and warmth and elevated wbc noted on d/c summary, start doxycycline 100 mg bid empirically, check cbc with diff, cmp 02/26/14 and monitor clinically  Left quad tendon rupture S/p surgical repair. Continue aspirin 325 mg bid for dvt prophylaxis, norco 7.5-325 1-2 tab every 4h prn pain and change zanaflex to 4 mg bid and 4mg  q8h prn muscle spasm  Iron def anemia Continue ferrous sulfate 325 mg tid, monitor h&h  HTN One elevated reading. Will have bp checked once a day and if > 3 reading > 140/90, increase norvasc, continue 5 mg daily for now  Constipation Continue docusate 100 mg bid and miralax 17 g bid   Goals of care: short term rehabilitation   Labs/tests ordered: cbc with diff, cmp   Family/ staff Communication: reviewed care plan with patient and nursing supervisor    Blanchie Serve, MD  [facility] [phone] (Monday-Friday 8 am - 5 pm) [phone] (afterhours)

## 2014-03-13 ENCOUNTER — Non-Acute Institutional Stay (SKILLED_NURSING_FACILITY): Payer: Medicare PPO | Admitting: Adult Health

## 2014-03-13 ENCOUNTER — Encounter: Payer: Self-pay | Admitting: Adult Health

## 2014-03-13 DIAGNOSIS — I10 Essential (primary) hypertension: Secondary | ICD-10-CM

## 2014-03-13 DIAGNOSIS — S76112S Strain of left quadriceps muscle, fascia and tendon, sequela: Secondary | ICD-10-CM

## 2014-03-13 DIAGNOSIS — D509 Iron deficiency anemia, unspecified: Secondary | ICD-10-CM

## 2014-03-13 DIAGNOSIS — K5900 Constipation, unspecified: Secondary | ICD-10-CM

## 2014-03-13 DIAGNOSIS — L03116 Cellulitis of left lower limb: Secondary | ICD-10-CM

## 2014-03-21 NOTE — Progress Notes (Signed)
Patient ID: Carl [name], male   DOB: February 06, 1941, 74 y.o.   MRN: [id number]   03/13/14  Facility:  Nursing Home Location:  [place] Room Number: 907-P LEVEL OF CARE:  SNF (31)   Chief Complaint  Patient presents with  . Discharge Note    Left quad tendon rupture S/P surgical repair, left leg cellulitis, anemia, hypertension and constipation    HISTORY OF PRESENT ILLNESS:  This is a 74 year old male who is for discharge home with home health PT and OT. He has past medical history of hypertension, hyperlipidemia and arthritis. He has been admitted to [facility] on 02/12/14 from [facility] with left quad tendon rupture S/P surgical repair.  Patient was admitted to this facility for short-term rehabilitation after the patient's recent hospitalization.  Patient has completed SNF rehabilitation and therapy has cleared the patient for discharge.  PAST MEDICAL HISTORY:  Past Medical History  Diagnosis Date  . Hypertension   . Hyperlipidemia   . Arthritis   . Quadriceps tendon rupture     LEFT    CURRENT MEDICATIONS: Reviewed per MAR/see medication list  No Known Allergies   REVIEW OF SYSTEMS:  GENERAL: no change in appetite, no fatigue, no weight changes, no fever, chills or weakness RESPIRATORY: no cough, SOB, DOE, wheezing, hemoptysis CARDIAC: no chest pain, edema or palpitations GI: no abdominal pain, diarrhea, constipation, heart burn, nausea or vomiting  PHYSICAL EXAMINATION  GENERAL: no acute distress, normal body habitus SKIN:  Left knee surgical site is dry, no erythema EYES: conjunctivae normal, sclerae normal, normal eye lids NECK: supple, trachea midline, no neck masses, no thyroid tenderness, no thyromegaly LYMPHATICS: no LAN in the neck, no supraclavicular LAN RESPIRATORY: breathing is even & unlabored, BS CTAB CARDIAC: RRR, no murmur,no extra heart sounds, no edema GI: abdomen soft, normal BS, no masses, no  tenderness, no hepatomegaly, no splenomegaly EXTREMITIES: Able to move 4 extremities; ambulates with walker PSYCHIATRIC: the patient is alert & oriented to person, affect & behavior appropriate  LABS/RADIOLOGY: 02/26/14  WBC 9.2 hemoglobin 13.2 hematocrit 40.0 MCV 95.9 sodium 139 potassium 3.9 glucose 110 BUN 14 creatinine 0.9 calcium 8.6 total protein 6.0 albumin 3.1 ALP 43 AST 11 ALT 17 GFR>60 Labs reviewed: Basic Metabolic Panel:  Recent Labs  02/16/14 1100 02/21/14 0455 02/22/14 0510  NA 138 137 140  K 3.9 4.0 4.2  CL 110 104 105  CO2 25 26 28   GLUCOSE 103* 166* 125*  BUN 18 15 19   CREATININE 1.11 1.02 0.97  CALCIUM 8.6 8.7 8.7   CBC:  Recent Labs  02/16/14 1100 02/21/14 0455 02/22/14 0510  WBC 6.0 8.8 19.2*  HGB 14.2 13.8 12.7*  HCT 43.0 41.6 39.4  MCV 94.1 94.5 95.6  PLT 187 234 225    ASSESSMENT/PLAN:  Left quad tendon rupture S/P surgical repair - for home health PT and OT; continue Zanaflex 4 mg 1 tab by mouth twice a day and every 8 hours when necessary for muscle spasm; and Ecotrin 325 mg 1 tab by mouth twice a day for DVT prophylaxis Left leg cellulitis - continue doxycycline 100 mg by mouth twice a day 9 days Iron deficiency anemia - hemoglobin 13.2; Iron was recently discontinued Hypertension - well controlled; continue Norvasc 5 mg by mouth daily Constipation - continue Colace 100 mg by mouth twice a day and MiraLAX 17 g +4-6 ounces liquid by mouth twice a day   I have filled out patient's discharge  paperwork and written prescriptions.  Patient will receive home health PT and OT.  Total discharge time: Less than 30 minutes  Discharge time involved coordination of the discharge process with Education officer, museum, nursing staff and therapy department. Medical justification for home health services verified.   [facility], NP [employer] [phone]

## 2014-04-09 ENCOUNTER — Ambulatory Visit: Payer: Medicare PPO | Attending: Orthopedic Surgery

## 2014-04-09 DIAGNOSIS — R269 Unspecified abnormalities of gait and mobility: Secondary | ICD-10-CM | POA: Diagnosis not present

## 2014-04-09 DIAGNOSIS — M25562 Pain in left knee: Secondary | ICD-10-CM | POA: Diagnosis not present

## 2014-04-09 DIAGNOSIS — W19XXXD Unspecified fall, subsequent encounter: Secondary | ICD-10-CM | POA: Insufficient documentation

## 2014-04-09 DIAGNOSIS — R609 Edema, unspecified: Secondary | ICD-10-CM | POA: Insufficient documentation

## 2014-04-09 DIAGNOSIS — I10 Essential (primary) hypertension: Secondary | ICD-10-CM | POA: Insufficient documentation

## 2014-04-09 DIAGNOSIS — Z9889 Other specified postprocedural states: Secondary | ICD-10-CM | POA: Diagnosis not present

## 2014-04-09 DIAGNOSIS — S76112D Strain of left quadriceps muscle, fascia and tendon, subsequent encounter: Secondary | ICD-10-CM | POA: Diagnosis not present

## 2014-04-09 DIAGNOSIS — R29898 Other symptoms and signs involving the musculoskeletal system: Secondary | ICD-10-CM | POA: Diagnosis not present

## 2014-04-09 DIAGNOSIS — M25662 Stiffness of left knee, not elsewhere classified: Secondary | ICD-10-CM

## 2014-04-09 DIAGNOSIS — E785 Hyperlipidemia, unspecified: Secondary | ICD-10-CM | POA: Insufficient documentation

## 2014-04-09 NOTE — Therapy (Signed)
[facility] [address] [place], [place], [address] Phone: [phone]   Fax:  [phone]  Physical Therapy Treatment  Patient Details  Name: Carl [name] MRN: [id number] Date of Birth: 07-24-1940 Referring Provider:  Mauri Pole, MD  Encounter Date: 04/09/2014      PT End of Session - 04/09/14 1542    Visit Number 1   Number of Visits --  10- Medicare   Date for PT Re-Evaluation 06/01/14   PT Start Time 1446   PT Stop Time 1553   PT Time Calculation (min) 67 min   Activity Tolerance Patient tolerated treatment well   Behavior During Therapy [facility] for tasks assessed/performed      Past Medical History  Diagnosis Date  . Hypertension   . Hyperlipidemia   . Arthritis   . Quadriceps tendon rupture     LEFT    Past Surgical History  Procedure Laterality Date  . Throat surgery  2010  . Quadriceps tendon repair Left 02/20/2014    Procedure: LEFT REPAIR QUADRICEP TENDON;  Surgeon: Mauri Pole, MD;  Location: [facility];  Service: Orthopedics;  Laterality: Left;    There were no vitals taken for this visit.  Visit Diagnosis:  Weakness of left leg - Plan: PT plan of care cert/re-cert  Edema - Plan: PT plan of care cert/re-cert  Abnormality of gait - Plan: PT plan of care cert/re-cert  Stiffness of knee joint, left - Plan: PT plan of care cert/re-cert      Subjective Assessment - 04/09/14 1454    Symptoms Pt is a 74 y.o. male who presents to PT s/p Lt quadriceps tendon repair.  Surgery was performed 02/20/14.  Pt has been locked into full extension since surgery.     Pertinent History Pt reports that he had a fall when he turned to turn a lamp off.  February 10, 2014.     Limitations Standing;Walking   How long can you walk comfortably? use of walker for ambulation with wearing immobilizing brace.   Patient Stated Goals normalize gait and improve strength of Lt LE   Currently in Pain? No/denies    Pain Score 0-No pain   Pain Location Knee   Pain Orientation Left   Multiple Pain Sites No          [facility] PT Assessment - 04/09/14 0001    Assessment   Medical Diagnosis s/p Lt quadriceps tendon repair   Onset Date 02/20/14   Next MD Visit 04/25/14   Prior Therapy in the hospital and then [facility] place x 3 weeks   Precautions   Precautions Knee   Precaution Booklet Issued No   Precaution Comments MD orders: quad strengthening exercises and PROM of Lt knee.  Unlock brace 20 every 2 weeks as tolerated   Required Braces or Orthoses Other Brace/Splint  Lt knee hinged brace   Restrictions   Weight Bearing Restrictions No   Other Position/Activity Restrictions Gait with brace on the Lt   Balance Screen   Has the patient fallen in the past 6 months Yes  fall that resulted in quad tendon rupture   How many times? 1   Has the patient had a decrease in activity level because of a fear of falling?  No   Is the patient reluctant to leave their home because of a fear of falling?  No   Home Environment   Living Enviornment Private residence   Living Arrangements Alone  Home Access Stairs to enter   Entrance Stairs-Number of Steps 6   Entrance Stairs-Rails None   Home Layout One level   [place] - 2 wheels;Toilet riser;Crutches   Prior Function   Level of Independence Independent with basic ADLs   Vocation Retired   Leisure Pt was exercising at the gym 4-5 days/week for yoga prior to injury   Cognition   Overall Cognitive Status Within Functional Limits for tasks assessed   Observation/Other Assessments   Focus on Therapeutic Outcomes (FOTO)  71% limitation   Posture/Postural Control   Posture/Postural Control No significant limitations   ROM / Strength   AROM / PROM / Strength PROM;Strength   PROM   Overall PROM  Deficits   PROM Assessment Site Knee   Right/Left Knee Left;Right   Left Knee Extension 0   Left Knee Flexion 55   Strength   Overall Strength Deficits    Overall Strength Comments good quad set and SLR without lag on Lt.  Quads not formally tested.     Strength Assessment Site Knee   Right/Left Knee Left   Left Knee Flexion 4+/5   Flexibility   Soft Tissue Assessment /Muscle Length no   Palpation   Palpation 5" surgical incision.  Well healed with reduced mobilitty over distal 1/2.     Edema: mid patella Lt 18", Rt 16"   Transfers   Transfers Sit to Stand;Stand to Sit   Sit to Stand With upper extremity assist;6: Modified independent (Device/Increase time)   Ambulation/Gait   Ambulation/Gait Yes   Ambulation/Gait Assistance 6: Modified independent (Device/Increase time)   Ambulation Distance (Feet) 100 Feet   Assistive device Rolling walker   Gait Pattern Step-to pattern;Decreased hip/knee flexion - left   Ambulation Surface Level                  [facility] Adult PT Treatment/Exercise - 04/09/14 0001    Exercises   Exercises Knee/Hip   Modalities   Modalities Cryotherapy   Cryotherapy   Number Minutes Cryotherapy 15 Minutes   Cryotherapy Location Knee   Type of Cryotherapy Other (comment)  Game Ready: Lt knee med compression, 3 flakes                PT Education - 04/09/14 1533    Education provided Yes   Education Details HEP   Person(s) Educated Patient   Methods Explanation;Demonstration;Tactile cues;Verbal cues;Handout   Comprehension Verbalized understanding;Returned demonstration;Tactile cues required          PT Short Term Goals - 04/09/14 1427    PT SHORT TERM GOAL #1   Title be independent with initial HEP   Time 4   Period Weeks   Status New   PT SHORT TERM GOAL #2   Title demonstrate Lt knee PROM flexion to > or = to 70 degrees   Time 4   Period Weeks   Status New   PT SHORT TERM GOAL #3   Title improve quad strength to wean from walker to use of cane or 1 crutch at least 75% in the home   Time 4   Period Weeks   Status New           PT Long Term Goals - 04/09/14 1424     PT LONG TERM GOAL #1   Title be independent in advanced HEP   Time 8   Period Weeks   Status New   PT LONG TERM GOAL #2   Title  reduce FOTO to < or = to 49% limitation   Time 8   Period Weeks   Status New   PT LONG TERM GOAL #3   Title demonstrate Lt knee PROM flexion to > or = to 100 degrees   Time 8   Period Weeks   Status New   PT LONG TERM GOAL #4   Title  improve Lt LE strength to wean from walker to use of cane or 1 crutch at least 50% in the community   Time 8   Period Weeks   Status New   PT LONG TERM GOAL #5   Title demonstrate minimal gait assymmetry with gait on level surface   Time 8   Period Weeks   Status New               Plan - Apr 10, 2014 1543    Clinical Impression Statement Pt presents to PT s/p Lt quad tendon repair.  Pt with functional quad weakness, limited AROM and gait abnormality s/p surgery.  Pt will follow MD orders to improve quad strength and normalize gait.     Pt will benefit from skilled therapeutic intervention in order to improve on the following deficits Abnormal gait;Decreased range of motion;Impaired flexibility;Decreased endurance;Increased edema;Decreased activity tolerance;Decreased scar mobility;Decreased mobility;Decreased strength   Rehab Potential Good   PT Frequency 2x / week   PT Duration 8 weeks   PT Treatment/Interventions ADLs/Self Care Home Management;Moist Heat;Therapeutic activities;Patient/family education;Scar mobilization;Therapeutic exercise;Passive range of motion;Other (comment);Ultrasound;Gait training;Balance training;Neuromuscular re-education;Stair training;Cryotherapy;Electrical Stimulation;Functional mobility training  Vasopneumatic device using Game Ready   PT Next Visit Plan Follow MD orders: quad strength, PROM Lt knee into flexion, scar mobs, edema management   Consulted and Agree with Plan of Care Patient          G-Codes - 2014-04-10 1424    Functional Limitation Mobility: Walking and moving around    Mobility: Walking and Moving Around Current Status [phone]) At least 60 percent but less than 80 percent impaired, limited or restricted   Mobility: Walking and Moving Around Goal Status [phone]) At least 40 percent but less than 60 percent impaired, limited or restricted      Problem List Patient Active Problem List   Diagnosis Date Noted  . Left quad muscle rupture 02/20/2014    Solymar Grace, PT Apr 10, 2014, 3:59 PM  [facility] [address] [place], [place], [address] Phone: [phone]   Fax:  [phone]

## 2014-04-09 NOTE — Patient Instructions (Signed)
Knee Extension: Short Arc (Eccentric) - Supine or Sitting   Lie on back with roll under knee. Extend knee. Slowly lower foot for 3-5 seconds. _10__ reps per set, _5__ sets per day, _7__ days per week.   Copyright  VHI. All rights reserved.  Quad Set   With other leg bent, foot flat, slowly tighten muscles on thigh of straight leg while counting out loud to _5___. Repeat _10-20___ times. Do __5Hip Flexion / Knee Extension: Straight-Leg Raise (Eccentric)   Lie on back. Lift leg with knee straight. Slowly lower leg for 3-5 seconds. ___10 reps per set, 20__ sets per day, _7__ days per week. Lower like elevator, stopping at each floor. Heel Slides   Use a strap or towel to Slide left heel along bed towards bottom. Hold for _5__ seconds. Perform gently. Slide back to flat knee position. Repeat 10___ times. Do _3__ times a day.     Copyright  VHI. All rights reserved.    Copyright  VHI. All rights reserved.  __ sessions per day.  http://gt2.exer.us/275   Copyright  VHI. All rights reserved.

## 2014-04-10 ENCOUNTER — Encounter: Payer: Self-pay | Admitting: Physical Therapy

## 2014-04-10 ENCOUNTER — Ambulatory Visit: Payer: Medicare PPO | Attending: Orthopedic Surgery | Admitting: Physical Therapy

## 2014-04-10 DIAGNOSIS — S76112D Strain of left quadriceps muscle, fascia and tendon, subsequent encounter: Secondary | ICD-10-CM | POA: Insufficient documentation

## 2014-04-10 DIAGNOSIS — M25562 Pain in left knee: Secondary | ICD-10-CM | POA: Diagnosis not present

## 2014-04-10 DIAGNOSIS — R269 Unspecified abnormalities of gait and mobility: Secondary | ICD-10-CM | POA: Insufficient documentation

## 2014-04-10 DIAGNOSIS — R29898 Other symptoms and signs involving the musculoskeletal system: Secondary | ICD-10-CM | POA: Insufficient documentation

## 2014-04-10 DIAGNOSIS — R609 Edema, unspecified: Secondary | ICD-10-CM | POA: Insufficient documentation

## 2014-04-10 DIAGNOSIS — E785 Hyperlipidemia, unspecified: Secondary | ICD-10-CM | POA: Diagnosis not present

## 2014-04-10 DIAGNOSIS — I10 Essential (primary) hypertension: Secondary | ICD-10-CM | POA: Diagnosis not present

## 2014-04-10 DIAGNOSIS — M25662 Stiffness of left knee, not elsewhere classified: Secondary | ICD-10-CM

## 2014-04-10 DIAGNOSIS — Z9889 Other specified postprocedural states: Secondary | ICD-10-CM | POA: Insufficient documentation

## 2014-04-10 NOTE — Patient Instructions (Addendum)
Plantar Flexion: Resisted   Anchor behind, tubing around left foot, press down. Use blue band Repeat _30___ times per set. Do _1___ sets per session. Do __1-2__ sessions per day.  http://orth.exer.us/10   Copyright  VHI. All rights reserved.  EXTENSION: Standing (Active)   Stand, both feet flat in walker. Draw left leg behind body as far as possible. Use _0__ lbs. Complete 15___ sets of __2_ repetitions. Perform _1__ sessions per day.  http://gtsc.exer.us/76   Copyright  VHI. All rights reserved.  ABDUCTION: Standing (Active)   Stand, feet flat in walker . Lift left leg out to side. Use _0__ lbs. Complete _10__ sets of _2__ repetitions. Perform _1__ sessions per day.  http://gtsc.exer.us/110   Copyright  VHI. All rights reserved.    Patient able to return demonstration correctly all above exercises.  Patient is independent with past HEP.

## 2014-04-10 NOTE — Therapy (Signed)
[facility] [address], [place], [place], [address] Phone: [phone]   Fax:  [phone]  Physical Therapy Treatment  Patient Details  Name: Carl [name] MRN: [id number] Date of Birth: 11-23-1940 Referring Provider:  Melinda Crutch, MD  Encounter Date: 04/10/2014      PT End of Session - 04/10/14 1655    Visit Number 2   Date for PT Re-Evaluation 06/01/14   PT Start Time 6314   PT Stop Time 1706   PT Time Calculation (min) 51 min   Equipment Utilized During Treatment Other (comment)  knee brace   Activity Tolerance Patient tolerated treatment well   Behavior During Therapy [facility] for tasks assessed/performed      Past Medical History  Diagnosis Date  . Hypertension   . Hyperlipidemia   . Arthritis   . Quadriceps tendon rupture     LEFT    Past Surgical History  Procedure Laterality Date  . Throat surgery  2010  . Quadriceps tendon repair Left 02/20/2014    Procedure: LEFT REPAIR QUADRICEP TENDON;  Surgeon: Mauri Pole, MD;  Location: [facility];  Service: Orthopedics;  Laterality: Left;    There were no vitals taken for this visit.  Visit Diagnosis:  Weakness of left leg  Edema  Stiffness of knee joint, left      Subjective Assessment - 04/10/14 1611    Symptoms Patient did well with increased in left knee flexion.     Pertinent History Pt reports that he had a fall when he turned to turn a lamp off.  February 10, 2014.     Limitations Standing;Walking   How long can you walk comfortably? use of walker for ambulation with wearing immobilizing brace.   Patient Stated Goals normalize gait and improve strength of Lt LE   Currently in Pain? No/denies   Pain Score 0-No pain   Multiple Pain Sites No                    [facility] Adult PT Treatment/Exercise - 04/10/14 0001    Knee/Hip Exercises: Supine   Quad Sets AROM;Left;2 sets;10 reps   Short Arc [employer] AROM;2 sets;15 reps   Short  Arc Quad Sets Limitations had brace on   Heel Slides PROM;Left;2 sets;10 reps   Heel Slides Limitations brace on using strap   Straight Leg Raises Left;3 sets;10 reps   Straight Leg Raises Limitations had brace on   Patellar Mobs all 4 directions   Modalities   Modalities Cryotherapy   Cryotherapy   Cryotherapy Location Knee  laying flat on mat   Type of Cryotherapy Other (comment)  vasopnuematic med.pressure,2 snowflakes   Manual Therapy   Manual Therapy Massage   Massage to left quadricep, scar mobiliaztion  muscle knots felt in left quads   Ankle Exercises: Supine   T-Band blue theraband PF 30 x                PT Education - 04/10/14 1654    Education provided Yes   Education Details standing hip abduction and extension with brace on in walker, long sitting ankle plantarflexion with blue band   Person(s) Educated Patient   Methods Explanation;Demonstration;Verbal cues;Handout   Comprehension Verbalized understanding;Returned demonstration          PT Short Term Goals - 04/09/14 1427    PT SHORT TERM GOAL #1   Title be independent with initial HEP   Time 4  Period Weeks   Status New   PT SHORT TERM GOAL #2   Title demonstrate Lt knee PROM flexion to > or = to 70 degrees   Time 4   Period Weeks   Status New   PT SHORT TERM GOAL #3   Title improve quad strength to wean from walker to use of cane or 1 crutch at least 75% in the home   Time 4   Period Weeks   Status New           PT Long Term Goals - April 22, 2014 1424    PT LONG TERM GOAL #1   Title be independent in advanced HEP   Time 8   Period Weeks   Status New   PT LONG TERM GOAL #2   Title reduce FOTO to < or = to 49% limitation   Time 8   Period Weeks   Status New   PT LONG TERM GOAL #3   Title demonstrate Lt knee PROM flexion to > or = to 100 degrees   Time 8   Period Weeks   Status New   PT LONG TERM GOAL #4   Title  improve Lt LE strength to wean from walker to use of cane or 1  crutch at least 50% in the community   Time 8   Period Weeks   Status New   PT LONG TERM GOAL #5   Title demonstrate minimal gait assymmetry with gait on level surface   Time 8   Period Weeks   Status New               Plan - 04/10/14 1655    Clinical Impression Statement Patient is independent with current home exercises.  Patient has knots in left quadricep, decreased mobility of left patella and scar.    Pt will benefit from skilled therapeutic intervention in order to improve on the following deficits Abnormal gait;Decreased range of motion;Impaired flexibility;Decreased endurance;Increased edema;Decreased activity tolerance;Decreased scar mobility;Decreased mobility;Decreased strength   Rehab Potential Good   PT Frequency 2x / week   PT Duration 8 weeks   PT Treatment/Interventions Scar mobilization;Patient/family education;Cryotherapy;Neuromuscular re-education;Manual techniques;Therapeutic exercise;Passive range of motion   PT Next Visit Plan Follow MD orders: quad strength, PROM Lt knee into flexion, scar mobs, edema management; weight shifting in standing with brace on   Consulted and Agree with Plan of Care Patient          G-Codes - 2014-04-22 1424    Functional Limitation Mobility: Walking and moving around   Mobility: Walking and Moving Around Current Status [phone]) At least 60 percent but less than 80 percent impaired, limited or restricted   Mobility: Walking and Moving Around Goal Status [phone]) At least 40 percent but less than 60 percent impaired, limited or restricted      Problem List Patient Active Problem List   Diagnosis Date Noted  . Left quad muscle rupture 02/20/2014    Janaki Exley, PT 04/10/2014, 5:06 PM  [facility] [address], [place], [place], [address] Phone: [phone]   Fax:  [phone]

## 2014-04-16 ENCOUNTER — Ambulatory Visit: Payer: Medicare PPO | Admitting: Physical Therapy

## 2014-04-16 ENCOUNTER — Encounter: Payer: Self-pay | Admitting: Physical Therapy

## 2014-04-16 DIAGNOSIS — R29898 Other symptoms and signs involving the musculoskeletal system: Secondary | ICD-10-CM

## 2014-04-16 DIAGNOSIS — S76112D Strain of left quadriceps muscle, fascia and tendon, subsequent encounter: Secondary | ICD-10-CM | POA: Diagnosis not present

## 2014-04-16 DIAGNOSIS — R609 Edema, unspecified: Secondary | ICD-10-CM

## 2014-04-16 DIAGNOSIS — M25662 Stiffness of left knee, not elsewhere classified: Secondary | ICD-10-CM

## 2014-04-16 NOTE — Patient Instructions (Addendum)
  With table supporting thighs, place ____ pound weight on right ankle. Hold ____ minutes. Repeat _10___ times per set. Do _2___ sets per session. Do __3__ sessions per day.  Functional Quadriceps: Sit to Stand   Sit on edge of chair, feet flat on floor. Stand upright, extending knees fully. Repeat ____ times per set. Do ____ sets per session. Do ____ sessions per day.  Hip Flexion / Knee Extension: Straight-Leg Raise (Eccentric)   Lie on back. Lift leg with knee straight. Slowly lower leg for 3-5 seconds. ___ reps per set, ___ sets per day, ___ days per week. Lower like elevator, stopping at each floor. Add ___ lbs when you achieve ___ repetitions. Rest on elbows. Rest on straight arms.  Heel Slide   Bend left knee and pull heel toward buttocks. Use sheet, strap, or rope to actively assist knee further into flexion. Hold stretch 10 secs.  Repeat ____ times. Do ____ sessions per day.  KNEE: Extension, Long Arc Quads - Sitting   Raise leg until knee is straight. ___ reps per set, ___ sets per day, ___ days per week  FLEXION: Sitting (Active)   Sit, both feet flat. Lift right knee toward ceiling. Use ___ lbs. Complete ___ sets of ___ repetitions. Perform ___ sessions per day.  Copyright  VHI. All rights reserved.      Copyright  VHI. All rights reserved.

## 2014-04-16 NOTE — Patient Instructions (Signed)
.  level1knee

## 2014-04-16 NOTE — Therapy (Signed)
[facility] [address], [place], [place], [address] Phone: [phone]   Fax:  [phone]  Physical Therapy Treatment  Patient Details  Name: Carl [name] MRN: [id number] Date of Birth: 1940-11-20 Referring Provider:  Lona Kettle, MD  Encounter Date: 04/16/2014      PT End of Session - 04/16/14 1606    Visit Number 3   Number of Visits 10   Date for PT Re-Evaluation 06/01/14   PT Start Time 7253   PT Stop Time 1547   PT Time Calculation (min) 61 min   Equipment Utilized During Treatment Other (comment)   Activity Tolerance Patient tolerated treatment well   Behavior During Therapy [facility] for tasks assessed/performed      Past Medical History  Diagnosis Date  . Hypertension   . Hyperlipidemia   . Arthritis   . Quadriceps tendon rupture     LEFT    Past Surgical History  Procedure Laterality Date  . Throat surgery  2010  . Quadriceps tendon repair Left 02/20/2014    Procedure: LEFT REPAIR QUADRICEP TENDON;  Surgeon: Mauri Pole, MD;  Location: [facility];  Service: Orthopedics;  Laterality: Left;    There were no vitals taken for this visit.  Visit Diagnosis:  Weakness of left leg  Edema  Stiffness of knee joint, left      Subjective Assessment - 04/16/14 1453    Symptoms after Lt quadriceps tendon repair, pt is limited in ROM & is wearing a brace    Pertinent History Pt reports that he had a fall when he turned to turn a lamp off.  February 10, 2014.     Limitations Standing;Walking   How long can you walk comfortably? use of walker for ambulation with wearing immobilizing brace.   Patient Stated Goals normalize gait and improve strength of Lt LE   Currently in Pain? No/denies   Pain Score 0-No pain   Pain Location Knee   Pain Orientation Left   Multiple Pain Sites No                    [facility] Adult PT Treatment/Exercise - 04/16/14 0001    Transfers   Transfers --  attem    Sit to Stand With upper extremity assist;6: Modified independent (Device/Increase time)  sitting at corner of chair or mat table to be able to stand    Knee/Hip Exercises: Seated   Heel Slides AROM;1 set  x 10 with brace on   Knee/Hip Exercises: Supine   Quad Sets AROM;3 sets;Left   Quad Sets Limitations had brace on   Short [employer] AROM;3 sets  with brace on   Short Arc [employer] Limitations brace on   Heel Slides AROM;3 sets   Heel Slides Limitations brace on   Straight Leg Raises Left;3 sets  Lt 3 x 10   Straight Leg Raises Limitations had brace on   Modalities   Modalities Cryotherapy  Vasopneumatic    Cryotherapy   Number Minutes Cryotherapy 15 Minutes   Cryotherapy Location Knee   Type of Cryotherapy Other (comment)  vasopneumatic device   Manual Therapy   Manual Therapy Edema management   Edema Management Left knee   Massage  Soft tissue wori to Lt knee & patellar mob caudal /med                PT Education - 04/16/14 1556    Education provided Yes  Education Details knee level 1 exercises   Person(s) Educated Patient   Methods Demonstration;Explanation   Comprehension Verbalized understanding;Returned demonstration          PT Short Term Goals - 04/09/14 1427    PT SHORT TERM GOAL #1   Title be independent with initial HEP   Time 4   Period Weeks   Status New   PT SHORT TERM GOAL #2   Title demonstrate Lt knee PROM flexion to > or = to 70 degrees   Time 4   Period Weeks   Status New   PT SHORT TERM GOAL #3   Title improve quad strength to wean from walker to use of cane or 1 crutch at least 75% in the home   Time 4   Period Weeks   Status New           PT Long Term Goals - 04/09/14 1424    PT LONG TERM GOAL #1   Title be independent in advanced HEP   Time 8   Period Weeks   Status New   PT LONG TERM GOAL #2   Title reduce FOTO to < or = to 49% limitation   Time 8   Period Weeks   Status New   PT LONG TERM GOAL #3    Title demonstrate Lt knee PROM flexion to > or = to 100 degrees   Time 8   Period Weeks   Status New   PT LONG TERM GOAL #4   Title  improve Lt LE strength to wean from walker to use of cane or 1 crutch at least 50% in the community   Time 8   Period Weeks   Status New   PT LONG TERM GOAL #5   Title demonstrate minimal gait assymmetry with gait on level surface   Time 8   Period Weeks   Status New               Plan - 04/16/14 1608    Clinical Impression Statement Patient has tight quadriceps and decreased Lt patella mobilisation   Pt will benefit from skilled therapeutic intervention in order to improve on the following deficits Abnormal gait;Decreased range of motion;Impaired flexibility;Decreased endurance;Increased edema;Decreased activity tolerance;Decreased scar mobility;Decreased mobility;Decreased strength   Rehab Potential Good   PT Frequency 2x / week   PT Duration 8 weeks   PT Treatment/Interventions Scar mobilization;Patient/family education;Cryotherapy;Neuromuscular re-education;Manual techniques;Therapeutic exercise;Passive range of motion   PT Next Visit Plan Follow MD orders: quad strength, PROM Lt knee into flexion, scar mobs, edema management; weight shifting in standing with brace on   Consulted and Agree with Plan of Care Patient        Problem List Patient Active Problem List   Diagnosis Date Noted  . Left quad muscle rupture 02/20/2014    NAUMANN-HOUEGNIFIO,Aracelie Addis PTA  04/16/2014, 5:24 PM  [facility] [address], [place], [place], [address] Phone: [phone]   Fax:  [phone]

## 2014-04-19 ENCOUNTER — Ambulatory Visit: Payer: Medicare PPO | Admitting: Physical Therapy

## 2014-04-19 ENCOUNTER — Encounter: Payer: Self-pay | Admitting: Physical Therapy

## 2014-04-19 DIAGNOSIS — R269 Unspecified abnormalities of gait and mobility: Secondary | ICD-10-CM

## 2014-04-19 DIAGNOSIS — S76112D Strain of left quadriceps muscle, fascia and tendon, subsequent encounter: Secondary | ICD-10-CM | POA: Diagnosis not present

## 2014-04-19 DIAGNOSIS — R609 Edema, unspecified: Secondary | ICD-10-CM

## 2014-04-19 DIAGNOSIS — M25662 Stiffness of left knee, not elsewhere classified: Secondary | ICD-10-CM

## 2014-04-19 DIAGNOSIS — R29898 Other symptoms and signs involving the musculoskeletal system: Secondary | ICD-10-CM

## 2014-04-19 NOTE — Therapy (Signed)
[facility] [address] [place], [place], [address] Phone: [phone]   Fax:  [phone]  Physical Therapy Treatment  Patient Details  Name: Carl [name] MRN: [id number] Date of Birth: 10-23-1940 Referring Provider:  Paralee Cancel, MD  Encounter Date: 04/19/2014      PT End of Session - 04/19/14 1548    Visit Number 4   Number of Visits 10  Medicare   Date for PT Re-Evaluation 06/01/14   PT Start Time 8756   PT Stop Time 1540   PT Time Calculation (min) 68 min   Activity Tolerance Patient tolerated treatment well   Behavior During Therapy [facility] for tasks assessed/performed      Past Medical History  Diagnosis Date  . Hypertension   . Hyperlipidemia   . Arthritis   . Quadriceps tendon rupture     LEFT    Past Surgical History  Procedure Laterality Date  . Throat surgery  2010  . Quadriceps tendon repair Left 02/20/2014    Procedure: LEFT REPAIR QUADRICEP TENDON;  Surgeon: Mauri Pole, MD;  Location: [facility];  Service: Orthopedics;  Laterality: Left;    There were no vitals filed for this visit.  Visit Diagnosis:  Weakness of left leg  Edema  Stiffness of knee joint, left  Abnormality of gait      Subjective Assessment - 04/19/14 1514    Symptoms Pt limited with ROM after Lt quadriceps repair and wearing brace   Pertinent History Pt reports that he had a fall when he turned to turn a lamp off.  February 10, 2014.     Limitations Standing;Walking   How long can you walk comfortably? use of walker for ambulation with wearing immobilizing brace.   Patient Stated Goals normalize gait and improve strength of Lt LE   Currently in Pain? No/denies   Multiple Pain Sites No                       [facility] Adult PT Treatment/Exercise - 04/19/14 0001    Transfers   Transfers Sit to Stand  into RW able to perform x 10 without sitting at corner   Ambulation/Gait   Ambulation/Gait  Yes   Ambulation/Gait Assistance 6: Modified independent (Device/Increase time)  brace    Ambulation Distance (Feet) 100 Feet   Assistive device Rolling walker   Gait Pattern Step-through pattern   Ambulation Surface Level   Knee/Hip Exercises: Aerobic   Stationary Bike Rocking x 74min  no brace   Knee/Hip Exercises: Standing   Knee Flexion AROM;Left  alternating marching x 1 min with RW, no brace   Other Standing Knee Exercises weightshift Lt & Rt x 1 min with RW, no brace   Knee/Hip Exercises: Seated   Heel Slides AROM;4 sets  no brace   Knee/Hip Exercises: Supine   Quad Sets AROM;3 sets;Left   Quad Sets Limitations no brace   Short Arc Quad Sets --  with brace on   Short Arc [employer] Limitations --   Heel Slides AROM   Heel Slides Limitations no brace   Knee/Hip Exercises: Sidelying   Hip ABduction AROM;3 sets  Lt LE with short lever arm / knee flexed   Other Sidelying Knee Exercises hip flexion/ext with short lever arm /knee flexed   Modalities   Modalities Cryotherapy   Cryotherapy   Number Minutes Cryotherapy 15 Minutes   Cryotherapy Location Knee   Type of Cryotherapy Other (  comment)  Vasopneumatic                  PT Short Term Goals - 04/19/14 1550    PT SHORT TERM GOAL #1   Title be independent with initial HEP   Time 4   Period Weeks   Status Achieved   PT SHORT TERM GOAL #2   Title demonstrate Lt knee PROM flexion to > or = to 70 degrees   Time 4   Period Weeks   Status On-going   PT SHORT TERM GOAL #3   Title improve quad strength to wean from walker to use of cane or 1 crutch at least 75% in the home   Time 4   Period Weeks   Status On-going           PT Long Term Goals - 04/19/14 1552    PT LONG TERM GOAL #1   Title be independent in advanced HEP   Time 8   Period Weeks   Status On-going   PT LONG TERM GOAL #2   Title reduce FOTO to < or = to 49% limitation   Time 8   Period Weeks   Status On-going   PT LONG TERM GOAL  #3   Title demonstrate Lt knee PROM flexion to > or = to 100 degrees   Time 8   Period Weeks   Status On-going   PT LONG TERM GOAL #4   Title  improve Lt LE strength to wean from walker to use of cane or 1 crutch at least 50% in the community   Time 8   Period Weeks   Status On-going               Plan - 04/19/14 1542    Clinical Impression Statement Brace adjusted to 45 degrees, AROM Lt knee flexion 45 degrees at beginning of PT session after rocdking on bike AROM 55 degrees   Pt will benefit from skilled therapeutic intervention in order to improve on the following deficits Abnormal gait;Decreased range of motion;Impaired flexibility;Decreased endurance;Increased edema;Decreased activity tolerance;Decreased scar mobility;Decreased mobility;Decreased strength   Rehab Potential Good   PT Frequency 2x / week   PT Duration 8 weeks   PT Treatment/Interventions Scar mobilization;Patient/family education;Cryotherapy;Neuromuscular re-education;Manual techniques;Therapeutic exercise;Passive range of motion   PT Next Visit Plan Follow MD orders: quad strength, PROM Lt knee into flexion, scar mobs, edema management; weight shifting in standing with brace on   Consulted and Agree with Plan of Care Patient        Problem List Patient Active Problem List   Diagnosis Date Noted  . Left quad muscle rupture 02/20/2014    NAUMANN-HOUEGNIFIO,Ava Deguire PTA 04/19/2014, 4:03 PM  [facility] [address] [place], [place], [address] Phone: [phone]   Fax:  [phone]

## 2014-04-23 ENCOUNTER — Ambulatory Visit: Payer: Medicare PPO

## 2014-04-23 DIAGNOSIS — R29898 Other symptoms and signs involving the musculoskeletal system: Secondary | ICD-10-CM

## 2014-04-23 DIAGNOSIS — R269 Unspecified abnormalities of gait and mobility: Secondary | ICD-10-CM

## 2014-04-23 DIAGNOSIS — S76112D Strain of left quadriceps muscle, fascia and tendon, subsequent encounter: Secondary | ICD-10-CM | POA: Diagnosis not present

## 2014-04-23 DIAGNOSIS — M25662 Stiffness of left knee, not elsewhere classified: Secondary | ICD-10-CM

## 2014-04-23 DIAGNOSIS — R609 Edema, unspecified: Secondary | ICD-10-CM

## 2014-04-23 NOTE — Therapy (Signed)
[facility] [address] [place], [place], [address] Phone: [phone]   Fax:  [phone]  Physical Therapy Treatment  Patient Details  Name: Carl [name] MRN: [id number] Date of Birth: 1940-07-18 Referring Provider:  Paralee Cancel, MD  Encounter Date: 04/23/2014      PT End of Session - 04/23/14 1448    Visit Number 5   Number of Visits 10  [employer]   Date for PT Re-Evaluation 06/01/14   Authorization Type [employer]   Authorization Time Period 04/09/14-05/24/14   Authorization - Visit Number 5   Authorization - Number of Visits 12   PT Start Time 1660   PT Stop Time 1540   PT Time Calculation (min) 59 min   Activity Tolerance Patient tolerated treatment well   Behavior During Therapy [facility] for tasks assessed/performed      Past Medical History  Diagnosis Date  . Hypertension   . Hyperlipidemia   . Arthritis   . Quadriceps tendon rupture     LEFT    Past Surgical History  Procedure Laterality Date  . Throat surgery  2010  . Quadriceps tendon repair Left 02/20/2014    Procedure: LEFT REPAIR QUADRICEP TENDON;  Surgeon: Mauri Pole, MD;  Location: [facility];  Service: Orthopedics;  Laterality: Left;    There were no vitals filed for this visit.  Visit Diagnosis:  Weakness of left leg - Plan: PT plan of care cert/re-cert  Edema - Plan: PT plan of care cert/re-cert  Stiffness of knee joint, left - Plan: PT plan of care cert/re-cert  Abnormality of gait - Plan: PT plan of care cert/re-cert      Subjective Assessment - 04/23/14 1439    Symptoms Going to the MD 04/25/14.  Lt knee feels good.  No pain.   Currently in Pain? No/denies   Multiple Pain Sites No            [facility] PT Assessment - 04/23/14 0001    PROM   Overall PROM  Deficits   PROM Assessment Site Knee   Right/Left Knee Left   Left Knee Extension 0   Left Knee Flexion 61   Strength   Overall Strength Deficits   Strength  Assessment Site Knee   Right/Left Knee Left   Left Knee Flexion 4+/5   Palpation   Palpation Edema Lt midpatella: 18 inches                   [facility] Adult PT Treatment/Exercise - 04/23/14 0001    Ambulation/Gait   Ambulation/Gait Yes   Ambulation/Gait Assistance 6: Modified independent (Device/Increase time)   Ambulation Distance (Feet) 200 Feet   Assistive device Straight cane   Gait Pattern Step-through pattern   Ambulation Surface Level   Knee/Hip Exercises: Aerobic   Stationary Bike Rocking x 32min  Pt wore brace   Knee/Hip Exercises: Supine   Short Arc Quad Sets Strengthening;Left;3 sets;10 reps   Short Arc Quad Sets Limitations 2# added   Heel Slides AROM;20 reps   Heel Slides Limitations no brace   Straight Leg Raises Left;3 sets  Lt 3 x 10   Straight Leg Raises Limitations No brace. 2# added   Knee/Hip Exercises: Sidelying   Hip ABduction Strengthening;Left;3 sets;10 reps;Other (comment)  2# added   Modalities   Modalities Cryotherapy   Cryotherapy   Number Minutes Cryotherapy 15 Minutes   Cryotherapy Location Knee   Type of Cryotherapy Other (comment)  Game Ready: Vasopmeumatic  PT Short Term Goals - 04/23/14 1451    PT SHORT TERM GOAL #1   Title be independent with initial HEP   Status Achieved   PT SHORT TERM GOAL #2   Status On-going  61 degrees   PT SHORT TERM GOAL #3   Title improve quad strength to wean from walker to use of cane or 1 crutch at least 75% in the home   Time 4   Period Weeks   Status On-going  Pt uses walker most of the time.  Pt has a crutch but not using it           PT Long Term Goals - 04/23/14 1507    PT LONG TERM GOAL #1   Title be independent in advanced HEP   Time 8   Period Weeks   Status On-going  Pt is independent with current HEP and compliant   PT LONG TERM GOAL #3   Title demonstrate Lt knee PROM flexion to > or = to 100 degrees   Time 8   Period Weeks   Status On-going   61 degrees   PT LONG TERM GOAL #4   Title  improve Lt LE strength to wean from walker to use of cane or 1 crutch at least 50% in the community   Time 8   Period Weeks   Status On-going  Pt ambulated in the clinic with cane on Rt with good stability and pattern               Plan - 04/23/14 1444    Clinical Impression Statement Pt with limited Lt knee ROM secondary to post-op precautions.  Brace is unlocked to 45 degrees per MD orders.  Pt tolerates all exercise within MD orders without limitation.  See goals for assessment.   Pt will benefit from skilled therapeutic intervention in order to improve on the following deficits Abnormal gait;Decreased range of motion;Impaired flexibility;Decreased endurance;Increased edema;Decreased activity tolerance;Decreased scar mobility;Decreased mobility;Decreased strength   Rehab Potential Good   PT Frequency 2x / week   PT Duration 8 weeks   PT Treatment/Interventions Scar mobilization;Patient/family education;Cryotherapy;Neuromuscular re-education;Manual techniques;Therapeutic exercise;Passive range of motion   PT Next Visit Plan Follow MD orders.  See what MD says at pt appointment 04/25/14.  Quad strength, edema management, proprioception and ROM within limits   Consulted and Agree with Plan of Care Patient        Problem List Patient Active Problem List   Diagnosis Date Noted  . Left quad muscle rupture 02/20/2014    Ayron Fillinger, PT 04/23/2014, 3:26 PM  [facility] [address], [place], [place], [address] Phone: [phone]   Fax:  [phone]

## 2014-04-26 ENCOUNTER — Ambulatory Visit: Payer: Medicare PPO

## 2014-04-26 DIAGNOSIS — M25662 Stiffness of left knee, not elsewhere classified: Secondary | ICD-10-CM

## 2014-04-26 DIAGNOSIS — R29898 Other symptoms and signs involving the musculoskeletal system: Secondary | ICD-10-CM

## 2014-04-26 DIAGNOSIS — R609 Edema, unspecified: Secondary | ICD-10-CM

## 2014-04-26 DIAGNOSIS — R269 Unspecified abnormalities of gait and mobility: Secondary | ICD-10-CM

## 2014-04-26 DIAGNOSIS — S76112D Strain of left quadriceps muscle, fascia and tendon, subsequent encounter: Secondary | ICD-10-CM | POA: Diagnosis not present

## 2014-04-26 NOTE — Patient Instructions (Signed)
Self-Mobilization: Knee Flexion / Extension (Sitting)   Gently push left leg back with other leg until a stretch is felt. Hold _10___ seconds. Relax. Repeat _10___ times per set. Do __2__ sets per session. Do __3__ sessions per day.  http://orth.exer.us/592   Copyright  VHI. All rights reserved.

## 2014-04-26 NOTE — Therapy (Signed)
[facility] [address] [place], [place], [address] Phone: [phone]   Fax:  [phone]  Physical Therapy Treatment  Patient Details  Name: Carl [name] MRN: [id number] Date of Birth: August 08, 1940 Referring Provider:  Paralee Cancel, MD  Encounter Date: 04/26/2014      PT End of Session - 04/26/14 1604    Visit Number 6   Number of Visits 10  Medicare   Date for PT Re-Evaluation 06/01/14   Authorization Type [employer]   Authorization Time Period 04/09/14-05/24/14   Authorization - Visit Number 6   Authorization - Number of Visits 12   PT Start Time 1525   PT Stop Time 1622   PT Time Calculation (min) 57 min   Activity Tolerance Patient tolerated treatment well   Behavior During Therapy [facility] for tasks assessed/performed      Past Medical History  Diagnosis Date  . Hypertension   . Hyperlipidemia   . Arthritis   . Quadriceps tendon rupture     LEFT    Past Surgical History  Procedure Laterality Date  . Throat surgery  2010  . Quadriceps tendon repair Left 02/20/2014    Procedure: LEFT REPAIR QUADRICEP TENDON;  Surgeon: Mauri Pole, MD;  Location: [facility];  Service: Orthopedics;  Laterality: Left;    There were no vitals filed for this visit.  Visit Diagnosis:  Weakness of left leg  Edema  Stiffness of knee joint, left  Abnormality of gait      Subjective Assessment - 04/26/14 1529    Symptoms Saw MD yesterday.  He can remove the brace in 3 weeks and start using the cane.  Pt is using 1 crutch today.     Currently in Pain? No/denies                       [facility] Adult PT Treatment/Exercise - 04/26/14 0001    Knee/Hip Exercises: Aerobic   Stationary Bike Rocking x 75min  no brace   Knee/Hip Exercises: Standing   Other Standing Knee Exercises mini tramp: 3 ways weight shifting x 1 minute each.  UE support   Knee/Hip Exercises: Seated   Long Arc Quad  AROM;Strengthening;Left;2 sets;10 reps;Other (comment)  5 sec. hold   Other Seated Knee Exercises seated knee flexion with overpressure 10 sec hold 2x10  added to HEP   Knee/Hip Exercises: Supine   Short Arc Quad Sets Strengthening;Left;3 sets;10 reps   Short Arc Quad Sets Limitations 3# added   Modalities   Modalities Cryotherapy   Cryotherapy   Number Minutes Cryotherapy 15 Minutes   Cryotherapy Location Knee   Type of Cryotherapy Other (comment)  Game Ready/vasopneumatic device                PT Education - 04/26/14 1553    Education provided Yes   Education Details HEP: seated knee flexion with overpressure   Person(s) Educated Patient   Methods Explanation;Demonstration;Handout   Comprehension Verbalized understanding          PT Short Term Goals - 04/23/14 1451    PT SHORT TERM GOAL #1   Title be independent with initial HEP   Status Achieved   PT SHORT TERM GOAL #2   Status On-going  61 degrees   PT SHORT TERM GOAL #3   Title improve quad strength to wean from walker to use of cane or 1 crutch at least 75% in the home   Time 4  Period Weeks   Status On-going  Pt uses walker most of the time.  Pt has a crutch but not using it           PT Long Term Goals - 04/23/14 1507    PT LONG TERM GOAL #1   Title be independent in advanced HEP   Time 8   Period Weeks   Status On-going  Pt is independent with current HEP and compliant   PT LONG TERM GOAL #3   Title demonstrate Lt knee PROM flexion to > or = to 100 degrees   Time 8   Period Weeks   Status On-going  61 degrees   PT LONG TERM GOAL #4   Title  improve Lt LE strength to wean from walker to use of cane or 1 crutch at least 50% in the community   Time 8   Period Weeks   Status On-going  Pt ambulated in the clinic with cane on Rt with good stability and pattern               Plan - 04/26/14 1555    Clinical Impression Statement Pt with improved gait with 1 crutch.  Pt with knee  flexion deficits and new exercise issued today to address.    Pt will benefit from skilled therapeutic intervention in order to improve on the following deficits Abnormal gait;Decreased range of motion;Impaired flexibility;Decreased endurance;Increased edema;Decreased activity tolerance;Decreased scar mobility;Decreased mobility;Decreased strength   Rehab Potential Good   PT Frequency 2x / week   PT Duration 8 weeks   PT Treatment/Interventions Scar mobilization;Patient/family education;Cryotherapy;Neuromuscular re-education;Manual techniques;Therapeutic exercise;Passive range of motion   PT Next Visit Plan Gentle quad strength, edema management, gait, standing/proprioception.  Encourage knee flexion.   Consulted and Agree with Plan of Care Patient        Problem List Patient Active Problem List   Diagnosis Date Noted  . Left quad muscle rupture 02/20/2014    Shala Baumbach, PT 04/26/2014, 4:07 PM  [facility] [address] [place], [place], [address] Phone: [phone]   Fax:  [phone]

## 2014-04-30 ENCOUNTER — Encounter: Payer: Self-pay | Admitting: Physical Therapy

## 2014-04-30 ENCOUNTER — Ambulatory Visit: Payer: Medicare PPO | Admitting: Physical Therapy

## 2014-04-30 DIAGNOSIS — S76112D Strain of left quadriceps muscle, fascia and tendon, subsequent encounter: Secondary | ICD-10-CM | POA: Diagnosis not present

## 2014-04-30 DIAGNOSIS — M25662 Stiffness of left knee, not elsewhere classified: Secondary | ICD-10-CM

## 2014-04-30 DIAGNOSIS — R609 Edema, unspecified: Secondary | ICD-10-CM

## 2014-04-30 DIAGNOSIS — R29898 Other symptoms and signs involving the musculoskeletal system: Secondary | ICD-10-CM

## 2014-04-30 NOTE — Therapy (Signed)
[facility] [address] [place], [place], [address] Phone: [phone]   Fax:  [phone]  Physical Therapy Treatment  Patient Details  Name: Carl [name] MRN: [id number] Date of Birth: 07/26/1940 Referring Provider:  Lona Kettle, MD  Encounter Date: 04/30/2014    Past Medical History  Diagnosis Date  . Hypertension   . Hyperlipidemia   . Arthritis   . Quadriceps tendon rupture     LEFT    Past Surgical History  Procedure Laterality Date  . Throat surgery  2010  . Quadriceps tendon repair Left 02/20/2014    Procedure: LEFT REPAIR QUADRICEP TENDON;  Surgeon: Mauri Pole, MD;  Location: [facility];  Service: Orthopedics;  Laterality: Left;    There were no vitals filed for this visit.  Visit Diagnosis:  Weakness of left leg  Edema  Stiffness of knee joint, left      Subjective Assessment - 04/30/14 1529    Symptoms Continues to use one axillary crutch. Brace comes off in 2 weeks.   Pertinent History Pt reports that he had a fall when he turned to turn a lamp off.  February 10, 2014.     Currently in Pain? No/denies   Multiple Pain Sites No            [facility] PT Assessment - 04/30/14 0001    AROM   Right Knee Extension --  0-78 degrees                   [facility] Adult PT Treatment/Exercise - 04/30/14 0001    Knee/Hip Exercises: Aerobic   Stationary Bike Rocking x 2min  no brace   Knee/Hip Exercises: Standing   Other Standing Knee Exercises mini tramp: 3 ways weight shifting x 1 minute each.  UE support   Knee/Hip Exercises: Seated   Long Arc Quad AROM;Strengthening;Left;2 sets;10 reps;Other (comment)  5 sec. hold   Knee/Hip Exercises: Supine   Short Arc Quad Sets Strengthening;Left;3 sets;10 reps   Short Arc Quad Sets Limitations 3#   Heel Slides Left;20 reps   Cryotherapy   Number Minutes Cryotherapy 15 Minutes   Cryotherapy Location Knee   Type of Cryotherapy --  Game  ready 3 flakes medium                  PT Short Term Goals - 04/30/14 1537    PT SHORT TERM GOAL #1   Title be independent with initial HEP   Time 4   Period Weeks   Status Achieved   PT SHORT TERM GOAL #2   Title demonstrate Lt knee PROM flexion to > or = to 70 degrees   Time 4   Period Weeks   Status Achieved  78 degrees active   PT SHORT TERM GOAL #3   Title improve quad strength to wean from walker to use of cane or 1 crutch at least 75% in the home   Time 4   Period Weeks   Status Achieved  Using one axillary crutch 100% of time.           PT Long Term Goals - 04/30/14 1603    PT LONG TERM GOAL #1   Title be independent in advanced HEP   Time 8   Period Weeks   Status On-going   PT LONG TERM GOAL #2   Title reduce FOTO to < or = to 49% limitation   Time 8   Period Weeks  Status On-going   PT LONG TERM GOAL #3   Title demonstrate Lt knee PROM flexion to > or = to 100 degrees   Time 8   Period Weeks   Status On-going   PT LONG TERM GOAL #4   Title  improve Lt LE strength to wean from walker to use of cane or 1 crutch at least 50% in the community   Time 8   Period Weeks   Status On-going      Patient's brace was unlocked to 60 degrees today.         Problem List Patient Active Problem List   Diagnosis Date Noted  . Left quad muscle rupture 02/20/2014    Taren Dymek, PTA 04/30/2014, 4:06 PM  [facility] [address], [place], [place], [address] Phone: [phone]   Fax:  [phone]

## 2014-04-30 NOTE — Patient Instructions (Signed)
Patient's brace was unlocked to 60 degrees today.

## 2014-05-02 ENCOUNTER — Ambulatory Visit: Payer: Medicare PPO

## 2014-05-02 DIAGNOSIS — R609 Edema, unspecified: Secondary | ICD-10-CM

## 2014-05-02 DIAGNOSIS — R29898 Other symptoms and signs involving the musculoskeletal system: Secondary | ICD-10-CM

## 2014-05-02 DIAGNOSIS — M25662 Stiffness of left knee, not elsewhere classified: Secondary | ICD-10-CM

## 2014-05-02 DIAGNOSIS — R269 Unspecified abnormalities of gait and mobility: Secondary | ICD-10-CM

## 2014-05-02 DIAGNOSIS — S76112D Strain of left quadriceps muscle, fascia and tendon, subsequent encounter: Secondary | ICD-10-CM | POA: Diagnosis not present

## 2014-05-02 NOTE — Therapy (Signed)
[facility] [address], [place], [place], [address] Phone: [phone]   Fax:  [phone]  Physical Therapy Treatment  Patient Details  Name: Carl [name] MRN: [id number] Date of Birth: 1940-07-13 Referring Provider:  Paralee Cancel, MD  Encounter Date: 05/02/2014      PT End of Session - 05/02/14 1523    Visit Number 8   Number of Visits 10  Medicare   Date for PT Re-Evaluation 06/01/14   Authorization Type [employer]   Authorization Time Period 04/09/14-05/24/14   Authorization - Visit Number 8   Authorization - Number of Visits 12   PT Start Time 1440   PT Stop Time 1540   PT Time Calculation (min) 60 min   Activity Tolerance Patient tolerated treatment well   Behavior During Therapy [facility] for tasks assessed/performed      Past Medical History  Diagnosis Date  . Hypertension   . Hyperlipidemia   . Arthritis   . Quadriceps tendon rupture     LEFT    Past Surgical History  Procedure Laterality Date  . Throat surgery  2010  . Quadriceps tendon repair Left 02/20/2014    Procedure: LEFT REPAIR QUADRICEP TENDON;  Surgeon: Mauri Pole, MD;  Location: [facility];  Service: Orthopedics;  Laterality: Left;    There were no vitals filed for this visit.  Visit Diagnosis:  Weakness of left leg  Edema  Stiffness of knee joint, left  Abnormality of gait      Subjective Assessment - 05/02/14 1451    Symptoms Pt using walker today secondary to Rt shoulder pain from using crutch.     Currently in Pain? No/denies                       [facility] Adult PT Treatment/Exercise - 05/02/14 0001    Knee/Hip Exercises: Aerobic   Stationary Bike Rocking x 2min  no brace   Knee/Hip Exercises: Standing   Terminal Knee Extension Strengthening;Left;Theraband;20 reps   Theraband Level (Terminal Knee Extension) Level 2 (Red)   Forward Step Up Left;Step Height: 2";Hand Hold: 2;20 reps   Other Standing  Knee Exercises mini tramp: 3 ways weight shifting x 1 minute each.  UE support   Knee/Hip Exercises: Supine   Short Arc Quad Sets Strengthening;Left;3 sets;10 reps   Short Arc Quad Sets Limitations --  3.5 added   Heel Slides Left;20 reps   Heel Slides Limitations assist by PT into flexion   Straight Leg Raises Left;Strengthening;2 sets;10 reps   Modalities   Modalities Cryotherapy   Cryotherapy   Number Minutes Cryotherapy 15 Minutes   Cryotherapy Location Knee   Type of Cryotherapy Other (comment)  Game Ready: 3 flakes, med compression, x 15 minutes   Ankle Exercises: Standing   SLS Lt with UE support 10x10 seconds   Heel Raises 20 reps                  PT Short Term Goals - 04/30/14 1537    PT SHORT TERM GOAL #1   Title be independent with initial HEP   Time 4   Period Weeks   Status Achieved   PT SHORT TERM GOAL #2   Title demonstrate Lt knee PROM flexion to > or = to 70 degrees   Time 4   Period Weeks   Status Achieved  78 degrees active   PT SHORT TERM GOAL #3   Title improve quad strength  to wean from walker to use of cane or 1 crutch at least 75% in the home   Time 4   Period Weeks   Status Achieved  Using one axillary crutch 100% of time.           PT Long Term Goals - 04/30/14 1603    PT LONG TERM GOAL #1   Title be independent in advanced HEP   Time 8   Period Weeks   Status On-going   PT LONG TERM GOAL #2   Title reduce FOTO to < or = to 49% limitation   Time 8   Period Weeks   Status On-going   PT LONG TERM GOAL #3   Title demonstrate Lt knee PROM flexion to > or = to 100 degrees   Time 8   Period Weeks   Status On-going   PT LONG TERM GOAL #4   Title  improve Lt LE strength to wean from walker to use of cane or 1 crutch at least 50% in the community   Time 8   Period Weeks   Status On-going               Plan - 05/02/14 1454    Clinical Impression Statement Pt with improved quad control with standing activity today.   Pt with continued quad atrophy and limited Lt knee AROM/PROM.     Pt will benefit from skilled therapeutic intervention in order to improve on the following deficits Abnormal gait;Decreased range of motion;Impaired flexibility;Decreased endurance;Increased edema;Decreased activity tolerance;Decreased scar mobility;Decreased mobility;Decreased strength   Rehab Potential Good   PT Frequency 2x / week   PT Duration 8 weeks   PT Treatment/Interventions Scar mobilization;Patient/family education;Cryotherapy;Neuromuscular re-education;Manual techniques;Therapeutic exercise;Passive range of motion   PT Next Visit Plan Gentle quad strength, edema management, gait, standing/proprioception.  Encourage knee flexion.   Consulted and Agree with Plan of Care Patient        Problem List Patient Active Problem List   Diagnosis Date Noted  . Left quad muscle rupture 02/20/2014    Trigg Delarocha, PT3/23/2016, 3:25 PM  [facility] [address] [place], [place], [address] Phone: [phone]   Fax:  [phone]

## 2014-05-07 ENCOUNTER — Encounter: Payer: Self-pay | Admitting: Physical Therapy

## 2014-05-07 ENCOUNTER — Ambulatory Visit: Payer: Medicare PPO | Admitting: Physical Therapy

## 2014-05-07 DIAGNOSIS — R29898 Other symptoms and signs involving the musculoskeletal system: Secondary | ICD-10-CM

## 2014-05-07 DIAGNOSIS — M25662 Stiffness of left knee, not elsewhere classified: Secondary | ICD-10-CM

## 2014-05-07 DIAGNOSIS — R609 Edema, unspecified: Secondary | ICD-10-CM

## 2014-05-07 DIAGNOSIS — R269 Unspecified abnormalities of gait and mobility: Secondary | ICD-10-CM

## 2014-05-07 DIAGNOSIS — S76112D Strain of left quadriceps muscle, fascia and tendon, subsequent encounter: Secondary | ICD-10-CM | POA: Diagnosis not present

## 2014-05-07 NOTE — Therapy (Signed)
[facility] [address], [place], [place], [address] Phone: [phone]   Fax:  [phone]  Physical Therapy Treatment  Patient Details  Name: Carl [name] MRN: [id number] Date of Birth: [birth year] Referring Provider:  Lona Kettle, MD  Encounter Date: 05/07/2014      PT End of Session - 05/07/14 1506    Visit Number 9   Number of Visits 10   Date for PT Re-Evaluation 06/01/14   Authorization Type [employer]   Authorization Time Period 04/09/14-05/24/14   Authorization - Visit Number 9   Authorization - Number of Visits 12   PT Start Time 6712   PT Stop Time 1525   PT Time Calculation (min) 52 min   Activity Tolerance Patient tolerated treatment well   Behavior During Therapy [facility] for tasks assessed/performed      Past Medical History  Diagnosis Date  . Hypertension   . Hyperlipidemia   . Arthritis   . Quadriceps tendon rupture     LEFT    Past Surgical History  Procedure Laterality Date  . Throat surgery  2010  . Quadriceps tendon repair Left 02/20/2014    Procedure: LEFT REPAIR QUADRICEP TENDON;  Surgeon: Mauri Pole, MD;  Location: [facility];  Service: Orthopedics;  Laterality: Left;    There were no vitals filed for this visit.  Visit Diagnosis:  Weakness of left leg  Edema  Stiffness of knee joint, left  Abnormality of gait      Subjective Assessment - 05/07/14 1454    Symptoms Pt using walker instead of crutch today due to Rt shloulder pain   Pertinent History Pt reports that he had a fall when he turned to turn a lamp off.  February 10, 2014.     Limitations Standing;Walking   How long can you walk comfortably? probably an hour with the walker and kneeimmobilizer brace   Patient Stated Goals normalize gait and improve strength Lt LE, increase ROM left knee   Currently in Pain? No/denies   Multiple Pain Sites No                       [facility] Adult PT Treatment/Exercise  - 05/07/14 0001    Knee/Hip Exercises: Aerobic   Stationary Bike Rocking x 32min  no brace   Knee/Hip Exercises: Standing   Lateral Step Up 20 reps;10 reps  from 4inch step   Forward Step Up 15 reps;10 reps  from 4 inch step   Step Down 20 reps;10 reps  from 6inch step with B UE   Other Standing Knee Exercises mini tramp: 3 ways weight shifting x 1 minute each.  UE support   Modalities   Modalities Cryotherapy   Cryotherapy   Number Minutes Cryotherapy 15 Minutes   Cryotherapy Location Knee   Type of Cryotherapy Other (comment)  Game Ready, Lt knee med compression, 3 snowflakes   Manual Therapy   Manual Therapy Myofascial release  soft tissue work along incision side with PROM for elongatio                  PT Short Term Goals - 05/07/14 1500    PT SHORT TERM GOAL #1   Title be independent with initial HEP   Time 4   Period Weeks   Status Achieved   PT SHORT TERM GOAL #2   Title demonstrate Lt knee PROM flexion to > or = to 70 degrees   Time  4   Period Weeks   Status Achieved   PT SHORT TERM GOAL #3   Title improve quad strength to wean from walker to use of cane or 1 crutch at least 75% in the home  needs to use walker now due to shoulder pain unable to use crutch   Time 4   Period Weeks   Status Achieved           PT Long Term Goals - 05/07/14 1502    PT LONG TERM GOAL #1   Title be independent in advanced HEP   Time 8   Period Weeks   Status On-going   PT LONG TERM GOAL #2   Title reduce FOTO to < or = to 49% limitation   Time 8   Period Weeks   Status On-going   PT LONG TERM GOAL #3   Title demonstrate Lt knee PROM flexion to > or = to 100 degrees   Time 8   Period Weeks   Status On-going   PT LONG TERM GOAL #4   Title  improve Lt LE strength to wean from walker to use of cane or 1 crutch at least 50% in the community   Time 8   Period Weeks   Status On-going   PT LONG TERM GOAL #5   Title demonstrate minimal gait assymmetry with  gait on level surface   Time 8   Period Weeks   Status On-going               Plan - 05/07/14 1522    Clinical Impression Statement Pt appears to have less swelling in Lt knee and improved quad control.   Pt will benefit from skilled therapeutic intervention in order to improve on the following deficits Abnormal gait;Decreased range of motion;Impaired flexibility;Decreased endurance;Increased edema;Decreased activity tolerance;Decreased scar mobility;Decreased mobility;Decreased strength   Rehab Potential Good   PT Frequency 2x / week   PT Duration 8 weeks   PT Treatment/Interventions Scar mobilization;Patient/family education;Cryotherapy;Neuromuscular re-education;Manual techniques;Therapeutic exercise;Passive range of motion   PT Next Visit Plan Reaasesment to request  additional visits from [facility] and Agree with Plan of Care Patient        Problem List Patient Active Problem List   Diagnosis Date Noted  . Left quad muscle rupture 02/20/2014    NAUMANN-HOUEGNIFIO,Daniela Hernan PTA 05/07/2014, 5:21 PM  [facility] [address] [place], [place], [address] Phone: [phone]   Fax:  [phone]

## 2014-05-10 ENCOUNTER — Ambulatory Visit: Payer: Medicare PPO

## 2014-05-10 DIAGNOSIS — R269 Unspecified abnormalities of gait and mobility: Secondary | ICD-10-CM

## 2014-05-10 DIAGNOSIS — S76112D Strain of left quadriceps muscle, fascia and tendon, subsequent encounter: Secondary | ICD-10-CM | POA: Diagnosis not present

## 2014-05-10 DIAGNOSIS — M25662 Stiffness of left knee, not elsewhere classified: Secondary | ICD-10-CM

## 2014-05-10 DIAGNOSIS — R29898 Other symptoms and signs involving the musculoskeletal system: Secondary | ICD-10-CM

## 2014-05-10 DIAGNOSIS — R609 Edema, unspecified: Secondary | ICD-10-CM

## 2014-05-10 NOTE — Therapy (Signed)
[facility] [address], [place], [place], [address] Phone: [phone]   Fax:  [phone]  Physical Therapy Treatment  Patient Details  Name: Carl [name] MRN: [id number] Date of Birth: 14-Mar-1940 Referring Provider:  Paralee Cancel, MD  Encounter Date: 05/10/2014      PT End of Session - 05/10/14 1522    Visit Number 10   Number of Visits 20  Medicare   Date for PT Re-Evaluation 07/06/14   Authorization Type [employer]   Authorization Time Period 04/09/14-05/24/14   Authorization - Visit Number 10   Authorization - Number of Visits 12   PT Start Time 6269   PT Stop Time 1540   PT Time Calculation (min) 56 min   Activity Tolerance Patient tolerated treatment well   Behavior During Therapy [facility] for tasks assessed/performed      Past Medical History  Diagnosis Date  . Hypertension   . Hyperlipidemia   . Arthritis   . Quadriceps tendon rupture     LEFT    Past Surgical History  Procedure Laterality Date  . Throat surgery  2010  . Quadriceps tendon repair Left 02/20/2014    Procedure: LEFT REPAIR QUADRICEP TENDON;  Surgeon: Mauri Pole, MD;  Location: [facility];  Service: Orthopedics;  Laterality: Left;    There were no vitals filed for this visit.  Visit Diagnosis:  Weakness of left leg - Plan: PT plan of care cert/re-cert  Edema - Plan: PT plan of care cert/re-cert  Stiffness of knee joint, left - Plan: PT plan of care cert/re-cert  Abnormality of gait - Plan: PT plan of care cert/re-cert          [facility] PT Assessment - 05/10/14 0001    Assessment   Medical Diagnosis s/p Lt quadriceps tendon repair   Onset Date 02/20/14   Observation/Other Assessments   Focus on Therapeutic Outcomes (FOTO)  58% limitation   PROM   Overall PROM  Deficits   PROM Assessment Site Knee   Right/Left Knee Left   Left Knee Extension 0   Left Knee Flexion 81   Strength   Overall Strength Deficits   Strength  Assessment Site Knee   Right/Left Knee Left   Left Knee Flexion 5/5   Left Knee Extension 4+/5   Palpation   Palpation Edema: Lt mid patella 17.75 inches                   [facility] Adult PT Treatment/Exercise - 05/10/14 0001    Knee/Hip Exercises: Aerobic   Stationary Bike Rocking x 12 min  no brace   Knee/Hip Exercises: Standing   Forward Step Up Left;2 sets;10 reps;Step Height: 4";Hand Hold: 1   Step Down Left;2 sets;10 reps;Hand Hold: 1;Step Height: 4"   Other Standing Knee Exercises mini tramp: 3 ways weight shifting x 1 minute each.  UE support   Knee/Hip Exercises: Seated   Long Arc Quad AROM;Strengthening;Left;2 sets;10 reps;Other (comment);Weights  5 sec. hold   Long Arc Quad Weight 2 lbs.   Cryotherapy   Number Minutes Cryotherapy 15 Minutes   Cryotherapy Location Knee   Type of Cryotherapy Other (comment)  Vasopneumatic device: 3 snowflakes, med compression, Lt k   Ankle Exercises: Standing   Heel Raises 20 reps                  PT Short Term Goals - 05/07/14 1500    PT SHORT TERM GOAL #1   Title  be independent with initial HEP   Time 4   Period Weeks   Status Achieved   PT SHORT TERM GOAL #2   Title demonstrate Lt knee PROM flexion to > or = to 70 degrees   Time 4   Period Weeks   Status Achieved   PT SHORT TERM GOAL #3   Title improve quad strength to wean from walker to use of cane or 1 crutch at least 75% in the home  needs to use walker now due to shoulder pain unable to use crutch   Time 4   Period Weeks   Status Achieved           PT Long Term Goals - 2014-06-04 1448    PT LONG TERM GOAL #1   Title be independent in advanced HEP   Time 8   Period Weeks   Status On-going  Pt is independent in current HEP   PT LONG TERM GOAL #2   Title reduce FOTO to < or = to 49% limitation   Time 8   Period Weeks   Status On-going  58% limitation today   PT LONG TERM GOAL #3   Title demonstrate Lt knee PROM flexion to > or = to 100  degrees   Time 8   Period Weeks   Status On-going  81 degrees today   PT LONG TERM GOAL #4   Title  improve Lt LE strength to wean from walker to use of cane or 1 crutch at least 50% in the community   Time 8   Period Weeks   Status Achieved  Pt is using 1 crutch for community distance and brace on Lt knee   PT LONG TERM GOAL #5   Title demonstrate minimal gait assymmetry with gait on level surface   Time 8   Period Weeks   Status On-going  Pt with continued antalgia with brace and crutch.  This is improving   Additional Long Term Goals   Additional Long Term Goals Yes   PT LONG TERM GOAL #6   Title demonstrate Lt quad stability with gait on level surface without brace or device as allowed by MD   Time 8   Period Weeks   Status New               Plan - 2014-06-04 1451    Clinical Impression Statement See note for objective measures and goal assessment for MD note today.     Pt will benefit from skilled therapeutic intervention in order to improve on the following deficits Abnormal gait;Decreased range of motion;Impaired flexibility;Decreased endurance;Increased edema;Decreased activity tolerance;Decreased scar mobility;Decreased mobility;Decreased strength   Rehab Potential Good   PT Frequency 2x / week   PT Duration 8 weeks  through 07/06/14   PT Treatment/Interventions Scar mobilization;Patient/family education;Cryotherapy;Neuromuscular re-education;Manual techniques;Therapeutic exercise;Passive range of motion   PT Next Visit Plan Check on status of [employer] visits (2 visits on initial authorization remain). Unlock brace by 20 degrees next week.     Consulted and Agree with Plan of Care Patient          G-Codes - Jun 04, 2014 1447    Functional Assessment Tool Used FOTO: 58% limitation   Functional Limitation Mobility: Walking and moving around   Mobility: Walking and Moving Around Current Status [phone]) At least 40 percent but less than 60 percent impaired, limited or  restricted   Mobility: Walking and Moving Around Goal Status ([id number]) At least 40 percent but less than  60 percent impaired, limited or restricted      Problem List Patient Active Problem List   Diagnosis Date Noted  . Left quad muscle rupture 02/20/2014    Sawsan Riggio, PT 05/10/2014, 3:27 PM  [facility] [address] [place], [place], [address] Phone: [phone]   Fax:  [phone]

## 2014-05-14 ENCOUNTER — Ambulatory Visit: Payer: Medicare PPO | Attending: Orthopedic Surgery

## 2014-05-14 DIAGNOSIS — E785 Hyperlipidemia, unspecified: Secondary | ICD-10-CM | POA: Insufficient documentation

## 2014-05-14 DIAGNOSIS — M25562 Pain in left knee: Secondary | ICD-10-CM | POA: Insufficient documentation

## 2014-05-14 DIAGNOSIS — R609 Edema, unspecified: Secondary | ICD-10-CM

## 2014-05-14 DIAGNOSIS — R29898 Other symptoms and signs involving the musculoskeletal system: Secondary | ICD-10-CM

## 2014-05-14 DIAGNOSIS — S76112D Strain of left quadriceps muscle, fascia and tendon, subsequent encounter: Secondary | ICD-10-CM | POA: Insufficient documentation

## 2014-05-14 DIAGNOSIS — I10 Essential (primary) hypertension: Secondary | ICD-10-CM | POA: Insufficient documentation

## 2014-05-14 DIAGNOSIS — R269 Unspecified abnormalities of gait and mobility: Secondary | ICD-10-CM | POA: Diagnosis not present

## 2014-05-14 DIAGNOSIS — Z9889 Other specified postprocedural states: Secondary | ICD-10-CM | POA: Diagnosis not present

## 2014-05-14 DIAGNOSIS — M25662 Stiffness of left knee, not elsewhere classified: Secondary | ICD-10-CM

## 2014-05-14 NOTE — Therapy (Signed)
[facility] [address] [place], [place], [address] Phone: [phone]   Fax:  [phone]  Physical Therapy Treatment  Patient Details  Name: Carl [name] MRN: [id number] Date of Birth: [birth year] Referring Provider:  Lona Kettle, MD  Encounter Date: 05/14/2014      PT End of Session - 05/14/14 1450    Visit Number 11   Number of Visits 20  Medicare   Date for PT Re-Evaluation 07/06/14   Authorization Type [employer]   Authorization Time Period visits authorized until 06/24/14   Authorization - Visit Number 11   Authorization - Number of Visits 18   PT Start Time 5465   PT Stop Time 1543   PT Time Calculation (min) 60 min   Activity Tolerance Patient tolerated treatment well   Behavior During Therapy [facility] for tasks assessed/performed      Past Medical History  Diagnosis Date  . Hypertension   . Hyperlipidemia   . Arthritis   . Quadriceps tendon rupture     LEFT    Past Surgical History  Procedure Laterality Date  . Throat surgery  2010  . Quadriceps tendon repair Left 02/20/2014    Procedure: LEFT REPAIR QUADRICEP TENDON;  Surgeon: Mauri Pole, MD;  Location: [facility];  Service: Orthopedics;  Laterality: Left;    There were no vitals filed for this visit.  Visit Diagnosis:  Weakness of left leg  Edema  Stiffness of knee joint, left  Abnormality of gait      Subjective Assessment - 05/14/14 1449    Subjective No pain today.  Using 1 crutch to walk.    Currently in Pain? No/denies   Pain Location Knee   Pain Orientation Left   Multiple Pain Sites No                       [facility] Adult PT Treatment/Exercise - 05/14/14 0001    Knee/Hip Exercises: Aerobic   Stationary Bike Rocking x 10 min  no brace   Knee/Hip Exercises: Standing   Forward Step Up Left;2 sets;10 reps;Step Height: 4";Hand Hold: 1   Step Down Left;2 sets;10 reps;Hand Hold: 1;Step Height: 4"   Other  Standing Knee Exercises mini tramp: 3 ways weight shifting x 1 minute each.  UE support   Knee/Hip Exercises: Seated   Long Arc Quad AROM;Strengthening;Left;2 sets;10 reps;Other (comment);Weights  5 sec. hold   Long Arc Quad Weight 3 lbs.  pt tolerated increased weight well today   Knee/Hip Exercises: Supine   Straight Leg Raises Left;Strengthening;2 sets;10 reps   Knee/Hip Exercises: Prone   Hamstring Curl 1 set;20 reps   Hamstring Curl Limitations 3# added   Modalities   Modalities Cryotherapy   Cryotherapy   Number Minutes Cryotherapy 15 Minutes   Cryotherapy Location Knee   Type of Cryotherapy Other (comment)  Lt knee: Game Ready: 3 snowflakes, medium compression   Manual Therapy   Manual Therapy Passive ROM;Myofascial release   Myofascial Release Lt anterior and posterior knee   Passive ROM into flexion with overpressure   Ankle Exercises: Standing   Heel Raises 20 reps   Other Standing Ankle Exercises standing on blue Airex pad x 3 minutes without UE support                  PT Short Term Goals - 05/07/14 1500    PT SHORT TERM GOAL #1   Title be independent with initial HEP  Time 4   Period Weeks   Status Achieved   PT SHORT TERM GOAL #2   Title demonstrate Lt knee PROM flexion to > or = to 70 degrees   Time 4   Period Weeks   Status Achieved   PT SHORT TERM GOAL #3   Title improve quad strength to wean from walker to use of cane or 1 crutch at least 75% in the home  needs to use walker now due to shoulder pain unable to use crutch   Time 4   Period Weeks   Status Achieved           PT Long Term Goals - 05/14/14 1457    PT LONG TERM GOAL #1   Title be independent in advanced HEP   Time 8   Period Weeks   Status On-going   PT LONG TERM GOAL #2   Title reduce FOTO to < or = to 49% limitation   Time 8   Period Weeks   Status On-going  58% limitation taken last week   PT LONG TERM GOAL #4   Title  improve Lt LE strength to wean from walker  to use of cane or 1 crutch at least 50% in the community   Status Achieved   PT LONG TERM GOAL #5   Title demonstrate minimal gait assymmetry with gait on level surface   Time 8   Period Weeks   Status On-going  improved Lt quad stability and equal step length with use of 1 crutch               Plan - 05/14/14 1459    Clinical Impression Statement No significant change in status since MD note complete last session.  PT unlocked brace to 90 degrees today.    Pt will benefit from skilled therapeutic intervention in order to improve on the following deficits Abnormal gait;Decreased range of motion;Impaired flexibility;Decreased endurance;Increased edema;Decreased activity tolerance;Decreased scar mobility;Decreased mobility;Decreased strength   Rehab Potential Good   PT Frequency 2x / week   PT Duration 8 weeks   PT Treatment/Interventions Scar mobilization;Patient/family education;Cryotherapy;Neuromuscular re-education;Manual techniques;Therapeutic exercise;Passive range of motion   PT Next Visit Plan Lt quad strength and proprioception. Gait training.     Consulted and Agree with Plan of Care Patient        Problem List Patient Active Problem List   Diagnosis Date Noted  . Left quad muscle rupture 02/20/2014    Demetreus Lothamer, PT 05/14/2014, 3:28 PM  [facility] [address], [place], [place], [address] Phone: [phone]   Fax:  [phone]

## 2014-05-16 ENCOUNTER — Encounter: Payer: Self-pay | Admitting: Physical Therapy

## 2014-05-16 ENCOUNTER — Ambulatory Visit: Payer: Medicare PPO | Admitting: Physical Therapy

## 2014-05-16 DIAGNOSIS — M25662 Stiffness of left knee, not elsewhere classified: Secondary | ICD-10-CM

## 2014-05-16 DIAGNOSIS — S76112D Strain of left quadriceps muscle, fascia and tendon, subsequent encounter: Secondary | ICD-10-CM | POA: Diagnosis not present

## 2014-05-16 DIAGNOSIS — R29898 Other symptoms and signs involving the musculoskeletal system: Secondary | ICD-10-CM

## 2014-05-16 DIAGNOSIS — R609 Edema, unspecified: Secondary | ICD-10-CM

## 2014-05-16 NOTE — Therapy (Signed)
[facility] [address], [place], [place], [address] Phone: [phone]   Fax:  [phone]  Physical Therapy Treatment  Patient Details  Name: Carl [name] MRN: [id number] Date of Birth: Aug 05, 1940 Referring Provider:  Lona Kettle, MD  Encounter Date: 05/16/2014      PT End of Session - 05/16/14 1526    Visit Number 12   Number of Visits 20   Date for PT Re-Evaluation 07/06/14   Authorization Type [employer]   Authorization Time Period visits authorized until 06/24/14   Authorization - Visit Number 11   Authorization - Number of Visits 18   PT Start Time 3875   PT Stop Time 1540   PT Time Calculation (min) 65 min   Activity Tolerance Patient tolerated treatment well   Behavior During Therapy [facility] for tasks assessed/performed      Past Medical History  Diagnosis Date  . Hypertension   . Hyperlipidemia   . Arthritis   . Quadriceps tendon rupture     LEFT    Past Surgical History  Procedure Laterality Date  . Throat surgery  2010  . Quadriceps tendon repair Left 02/20/2014    Procedure: LEFT REPAIR QUADRICEP TENDON;  Surgeon: Mauri Pole, MD;  Location: [facility];  Service: Orthopedics;  Laterality: Left;    There were no vitals filed for this visit.  Visit Diagnosis:  Weakness of left leg  Edema  Stiffness of knee joint, left      Subjective Assessment - 05/16/14 1443    Subjective No pain today.  Using 1 crutch to walk.    Currently in Pain? No/denies   Multiple Pain Sites No            [facility] PT Assessment - 05/16/14 0001    AROM   Right Knee Extension 92  This is flexion                   [facility] Adult PT Treatment/Exercise - 05/16/14 0001    Knee/Hip Exercises: Aerobic   Stationary Bike Rocking x 10 min  no brace   Knee/Hip Exercises: Standing   Forward Step Up Left;2 sets;10 reps;Step Height: 4";Hand Hold: 1   Step Down Left;2 sets;10 reps;Hand Hold: 1;Step  Height: 4"   Other Standing Knee Exercises mini tramp: 3 ways weight shifting x 1 minute each.  UE support   Knee/Hip Exercises: Seated   Long Arc Quad AROM;Strengthening;Left;2 sets;10 reps;Other (comment);Weights  5 sec. hold   Long Arc Quad Weight 3 lbs.   Other Seated Knee Exercises REd band ham curls 2x 10    Cryotherapy   Number Minutes Cryotherapy 15 Minutes   Cryotherapy Location Knee   Type of Cryotherapy --  Gameready 3 flakes medium   Manual Therapy   Manual Therapy Passive ROM;Myofascial release   Myofascial Release Lt anterior and posterior knee                  PT Short Term Goals - 05/07/14 1500    PT SHORT TERM GOAL #1   Title be independent with initial HEP   Time 4   Period Weeks   Status Achieved   PT SHORT TERM GOAL #2   Title demonstrate Lt knee PROM flexion to > or = to 70 degrees   Time 4   Period Weeks   Status Achieved   PT SHORT TERM GOAL #3   Title improve quad strength to wean from walker to  use of cane or 1 crutch at least 75% in the home  needs to use walker now due to shoulder pain unable to use crutch   Time 4   Period Weeks   Status Achieved           PT Long Term Goals - 05/14/14 1457    PT LONG TERM GOAL #1   Title be independent in advanced HEP   Time 8   Period Weeks   Status On-going   PT LONG TERM GOAL #2   Title reduce FOTO to < or = to 49% limitation   Time 8   Period Weeks   Status On-going  58% limitation taken last week   PT LONG TERM GOAL #4   Title  improve Lt LE strength to wean from walker to use of cane or 1 crutch at least 50% in the community   Status Achieved   PT LONG TERM GOAL #5   Title demonstrate minimal gait assymmetry with gait on level surface   Time 8   Period Weeks   Status On-going  improved Lt quad stability and equal step length with use of 1 crutch               Plan - 05/16/14 1527    Clinical Impression Statement Patient increased ROM today.   Pt will benefit from  skilled therapeutic intervention in order to improve on the following deficits Abnormal gait;Decreased range of motion;Impaired flexibility;Decreased endurance;Increased edema;Decreased activity tolerance;Decreased scar mobility;Decreased mobility;Decreased strength   Rehab Potential Good   PT Frequency 2x / week   PT Duration 8 weeks   PT Treatment/Interventions Scar mobilization;Patient/family education;Cryotherapy;Neuromuscular re-education;Manual techniques;Therapeutic exercise;Passive range of motion   PT Next Visit Plan Knee ROM, quad strength   Consulted and Agree with Plan of Care Patient        Problem List Patient Active Problem List   Diagnosis Date Noted  . Left quad muscle rupture 02/20/2014    Tomeeka Plaugher 05/16/2014, 3:32 PM  [facility] [address], [place], [place], [address] Phone: [phone]   Fax:  [phone]

## 2014-05-21 ENCOUNTER — Ambulatory Visit: Payer: Medicare PPO | Admitting: Physical Therapy

## 2014-05-21 ENCOUNTER — Encounter: Payer: Self-pay | Admitting: Physical Therapy

## 2014-05-21 DIAGNOSIS — M25662 Stiffness of left knee, not elsewhere classified: Secondary | ICD-10-CM

## 2014-05-21 DIAGNOSIS — S76112D Strain of left quadriceps muscle, fascia and tendon, subsequent encounter: Secondary | ICD-10-CM | POA: Diagnosis not present

## 2014-05-21 NOTE — Therapy (Signed)
[facility] [address] [place], [place], [address] Phone: [phone]   Fax:  [phone]  Physical Therapy Treatment  Patient Details  Name: Carl [name] MRN: [id number] Date of Birth: 1940-05-28 Referring Provider:  Lona Kettle, MD  Encounter Date: 05/21/2014      PT End of Session - 05/21/14 1509    Visit Number 13   Number of Visits 20   Date for PT Re-Evaluation 07/06/14   Authorization Type [employer]   Authorization Time Period visits authorized until 06/24/14   Authorization - Visit Number 11   Authorization - Number of Visits 18   PT Start Time 3295   PT Stop Time 1545   PT Time Calculation (min) 60 min   Activity Tolerance Patient tolerated treatment well   Behavior During Therapy [facility] for tasks assessed/performed      Past Medical History  Diagnosis Date  . Hypertension   . Hyperlipidemia   . Arthritis   . Quadriceps tendon rupture     LEFT    Past Surgical History  Procedure Laterality Date  . Throat surgery  2010  . Quadriceps tendon repair Left 02/20/2014    Procedure: LEFT REPAIR QUADRICEP TENDON;  Surgeon: Mauri Pole, MD;  [facility]: [facility];  Service: Orthopedics;  Laterality: Left;    There were no vitals filed for this visit.  Visit Diagnosis:  Stiffness of knee joint, left      Subjective Assessment - 05/21/14 1453    Subjective Patient out of brace   Currently in Pain? No/denies   Multiple Pain Sites No            [facility] PT Assessment - 05/21/14 0001    AROM   Right Knee Extension 96  Knee flexion                   [facility] Adult PT Treatment/Exercise - 05/21/14 0001    Knee/Hip Exercises: Aerobic   Stationary Bike Rocking x 10 min  no brace   Knee/Hip Exercises: Standing   Forward Step Up Left;3 sets;10 reps;Hand Hold: 1;Step Height: 6"   Step Down Left;3 sets;10 reps;Hand Hold: 2;Step Height: 4"   Other Standing Knee Exercises mini tramp: 3  ways weight shifting x 1 minute each.  UE support   Knee/Hip Exercises: Seated   Long Arc Quad Strengthening;Left;3 sets;10 reps   Long Arc Quad Weight 4 lbs.   Other Seated Knee Exercises REd band ham curls 2x 10   performed between stretching sets   Cryotherapy   Number Minutes Cryotherapy 15 Minutes   Cryotherapy Location Knee   Type of Cryotherapy Other (comment)  Gameready 3 flakes medium   Manual Therapy   Manual Therapy Passive ROM;Myofascial release                  PT Short Term Goals - 05/21/14 1502    PT SHORT TERM GOAL #1   Title be independent with initial HEP   Time 4   Period Weeks   Status Achieved   PT SHORT TERM GOAL #2   Title demonstrate Lt knee PROM flexion to > or = to 70 degrees   Time 4   Period Weeks   Status Achieved   PT SHORT TERM GOAL #3   Title improve quad strength to wean from walker to use of cane or 1 crutch at least 75% in the home   Time 4   Status Achieved  PT Long Term Goals - 05/21/14 1503    PT LONG TERM GOAL #1   Title be independent in advanced HEP   Time 8   Period Weeks   Status On-going   PT LONG TERM GOAL #2   Title reduce FOTO to < or = to 49% limitation   Time 8   Period Weeks   Status On-going   PT LONG TERM GOAL #3   Title demonstrate Lt knee PROM flexion to > or = to 100 degrees   Time 8   Period Weeks   Status On-going   PT LONG TERM GOAL #4   Time 8   Period Weeks   Status Achieved               Plan - 05/21/14 1516    Clinical Impression Statement Patient out of bace ambulating with one axillary crutch now.    Pt will benefit from skilled therapeutic intervention in order to improve on the following deficits Abnormal gait;Decreased range of motion;Impaired flexibility;Decreased endurance;Increased edema;Decreased activity tolerance;Decreased scar mobility;Decreased mobility;Decreased strength   Rehab Potential Good   PT Frequency 2x / week   PT Duration 8 weeks   PT  Treatment/Interventions Scar mobilization;Patient/family education;Cryotherapy;Neuromuscular re-education;Manual techniques;Therapeutic exercise;Passive range of motion   PT Next Visit Plan Knee ROM, quad strength   Consulted and Agree with Plan of Care Patient        Problem List Patient Active Problem List   Diagnosis Date Noted  . Left quad muscle rupture 02/20/2014    Kyal Arts , PTA  05/21/2014, 3:28 PM  [facility] [address] [place], [place], [address] Phone: [phone]   Fax:  [phone]

## 2014-05-23 ENCOUNTER — Ambulatory Visit: Payer: Medicare PPO | Admitting: Physical Therapy

## 2014-05-23 ENCOUNTER — Encounter: Payer: Self-pay | Admitting: Physical Therapy

## 2014-05-23 DIAGNOSIS — R269 Unspecified abnormalities of gait and mobility: Secondary | ICD-10-CM

## 2014-05-23 DIAGNOSIS — R29898 Other symptoms and signs involving the musculoskeletal system: Secondary | ICD-10-CM

## 2014-05-23 DIAGNOSIS — R609 Edema, unspecified: Secondary | ICD-10-CM

## 2014-05-23 DIAGNOSIS — M25662 Stiffness of left knee, not elsewhere classified: Secondary | ICD-10-CM

## 2014-05-23 DIAGNOSIS — S76112D Strain of left quadriceps muscle, fascia and tendon, subsequent encounter: Secondary | ICD-10-CM | POA: Diagnosis not present

## 2014-05-23 NOTE — Therapy (Signed)
[facility] [address], [place], [place], [address] Phone: [phone]   Fax:  [phone]  Physical Therapy Treatment  Patient Details  Name: Carl [name] MRN: [id number] Date of Birth: [birth year] Referring Provider:  Paralee Cancel, MD  Encounter Date: 05/23/2014      PT End of Session - 05/23/14 1450    Visit Number 14   Number of Visits 20   Date for PT Re-Evaluation 07/06/14   Authorization Type [employer]   Authorization Time Period visits authorized until 06/24/14   Authorization - Visit Number 11   Authorization - Number of Visits 18   PT Start Time 1440   PT Stop Time 1545   PT Time Calculation (min) 65 min   Equipment Utilized During Treatment Other (comment)   Activity Tolerance Patient tolerated treatment well   Behavior During Therapy [facility] for tasks assessed/performed      Past Medical History  Diagnosis Date  . Hypertension   . Hyperlipidemia   . Arthritis   . Quadriceps tendon rupture     LEFT    Past Surgical History  Procedure Laterality Date  . Throat surgery  2010  . Quadriceps tendon repair Left 02/20/2014    Procedure: LEFT REPAIR QUADRICEP TENDON;  Surgeon: Mauri Pole, MD;  Location: [facility];  Service: Orthopedics;  Laterality: Left;    There were no vitals filed for this visit.  Visit Diagnosis:  Stiffness of knee joint, left  Weakness of left leg  Edema  Abnormality of gait      Subjective Assessment - 05/23/14 1443    Subjective Doing well he reports. Using one axillary crutch today.   Currently in Pain? No/denies   Multiple Pain Sites No                       [facility] Adult PT Treatment/Exercise - 05/23/14 0001    Knee/Hip Exercises: Aerobic   Stationary Bike Rocking x 10 min  no brace   Knee/Hip Exercises: Standing   Forward Step Up Left;3 sets;10 reps;Hand Hold: 1;Step Height: 6"   Step Down Left;3 sets;10 reps;Hand Hold: 2;Step Height: 4"    Other Standing Knee Exercises mini tramp: 3 ways weight shifting x 1 minute each.  UE support   Knee/Hip Exercises: Seated   Long Arc Quad Strengthening;Left;3 sets;10 reps   Long Arc Quad Weight 5 lbs.   Cryotherapy   Number Minutes Cryotherapy 15 Minutes   Cryotherapy Location Knee   Type of Cryotherapy --  Gameready 3 flakes medium   Manual Therapy   Manual Therapy Passive ROM;Myofascial release                  PT Short Term Goals - 05/21/14 1502    PT SHORT TERM GOAL #1   Title be independent with initial HEP   Time 4   Period Weeks   Status Achieved   PT SHORT TERM GOAL #2   Title demonstrate Lt knee PROM flexion to > or = to 70 degrees   Time 4   Period Weeks   Status Achieved   PT SHORT TERM GOAL #3   Title improve quad strength to wean from walker to use of cane or 1 crutch at least 75% in the home   Time 4   Status Achieved           PT Long Term Goals - 05/23/14 1504    PT LONG TERM  GOAL #1   Title be independent in advanced HEP   Time 8   Period Weeks   Status On-going               Plan - 05/23/14 1504    Clinical Impression Statement ROM continues to improve.   Pt will benefit from skilled therapeutic intervention in order to improve on the following deficits Abnormal gait;Decreased range of motion;Impaired flexibility;Decreased endurance;Increased edema;Decreased activity tolerance;Decreased scar mobility;Decreased mobility;Decreased strength   Rehab Potential Good   PT Frequency 2x / week   PT Duration 8 weeks   PT Treatment/Interventions Scar mobilization;Patient/family education;Cryotherapy;Neuromuscular re-education;Manual techniques;Therapeutic exercise;Passive range of motion   PT Next Visit Plan Knee ROM, quad strength   Consulted and Agree with Plan of Care Patient        Problem List Patient Active Problem List   Diagnosis Date Noted  . Left quad muscle rupture 02/20/2014    Niki Payment, PTA 05/23/2014,  3:10 PM  [facility] [address], [place], [place], [address] Phone: [phone]   Fax:  [phone]

## 2014-05-28 ENCOUNTER — Ambulatory Visit: Payer: Medicare PPO

## 2014-05-28 DIAGNOSIS — R29898 Other symptoms and signs involving the musculoskeletal system: Secondary | ICD-10-CM

## 2014-05-28 DIAGNOSIS — R269 Unspecified abnormalities of gait and mobility: Secondary | ICD-10-CM

## 2014-05-28 DIAGNOSIS — S76112D Strain of left quadriceps muscle, fascia and tendon, subsequent encounter: Secondary | ICD-10-CM | POA: Diagnosis not present

## 2014-05-28 DIAGNOSIS — M25662 Stiffness of left knee, not elsewhere classified: Secondary | ICD-10-CM

## 2014-05-28 DIAGNOSIS — R609 Edema, unspecified: Secondary | ICD-10-CM

## 2014-05-28 NOTE — Therapy (Signed)
[facility] [address] [place], [place], [address] Phone: [phone]   Fax:  [phone]  Physical Therapy Treatment  Patient Details  Name: Carl [name] MRN: [id number] Date of Birth: [birth year] Referring Provider:  Lona Kettle, MD  Encounter Date: 05/28/2014      PT End of Session - 05/28/14 1523    Visit Number 15   Number of Visits 20   Date for PT Re-Evaluation 07/06/14   Authorization Type [employer]   Authorization Time Period visits authorized until 06/24/14   Authorization - Visit Number 12   Authorization - Number of Visits 18   PT Start Time 5732   PT Stop Time 1542   PT Time Calculation (min) 63 min   Activity Tolerance Patient tolerated treatment well   Behavior During Therapy [facility] for tasks assessed/performed      Past Medical History  Diagnosis Date  . Hypertension   . Hyperlipidemia   . Arthritis   . Quadriceps tendon rupture     LEFT    Past Surgical History  Procedure Laterality Date  . Throat surgery  2010  . Quadriceps tendon repair Left 02/20/2014    Procedure: LEFT REPAIR QUADRICEP TENDON;  Surgeon: Mauri Pole, MD;  Location: [facility];  Service: Orthopedics;  Laterality: Left;    There were no vitals filed for this visit.  Visit Diagnosis:  Weakness of left leg  Stiffness of knee joint, left  Edema  Abnormality of gait      Subjective Assessment - 05/28/14 1451    Subjective Haven't used a brace in a week.  Leg feels good.  Pt just started driving again.     Currently in Pain? No/denies            [facility] PT Assessment - 05/28/14 0001    Assessment   Next MD Visit 06/06/14   ROM / Strength   AROM / PROM / Strength AROM;PROM   PROM   Right/Left Knee Left   Left Knee Flexion 101                     [facility] Adult PT Treatment/Exercise - 05/28/14 0001    Knee/Hip Exercises: Aerobic   Stationary Bike Rocking x 10 min.  then [employer] bike: seat  30, full revolutions x 5 minutes   Knee/Hip Exercises: Standing   Forward Step Up Left;3 sets;10 reps;Hand Hold: 1;Step Height: 6"   Step Down Left;3 sets;10 reps;Hand Hold: 2;Step Height: 6"  tolerated increased step height.  Increaesd challenge   Other Standing Knee Exercises mini tramp: 3 ways weight shifting x 1 minute each.  UE support   Cryotherapy   Number Minutes Cryotherapy 15 Minutes   Cryotherapy Location Knee   Type of Cryotherapy Other (comment)  Lt knee   Manual Therapy   Manual Therapy Passive ROM;Myofascial release   Myofascial Release Lt knee flexion and soft tissue stretch at distal quad.   Ankle Exercises: Standing   SLS Lt with UE support as needed 5x 10 seconds                  PT Short Term Goals - 05/21/14 1502    PT SHORT TERM GOAL #1   Title be independent with initial HEP   Time 4   Period Weeks   Status Achieved   PT SHORT TERM GOAL #2   Title demonstrate Lt knee PROM flexion to > or = to 70 degrees  Time 4   Period Weeks   Status Achieved   PT SHORT TERM GOAL #3   Title improve quad strength to wean from walker to use of cane or 1 crutch at least 75% in the home   Time 4   Status Achieved           PT Long Term Goals - 05/28/14 1455    PT LONG TERM GOAL #1   Title be independent in advanced HEP   Time 8   Period Weeks   Status On-going   PT LONG TERM GOAL #3   Title demonstrate Lt knee PROM flexion to > or = to 100 degrees   Time 8   Period Weeks   Status Achieved  101 degrees   PT LONG TERM GOAL #4   Title  improve Lt LE strength to wean from walker to use of cane or 1 crutch at least 50% in the community   Status Achieved  using in the community due to continued Lt knee weakness               Plan - 05/28/14 1523    Clinical Impression Statement Pt with improved quad stability with gait and standing activity.  Pt able to do full revolutions on the bike.     Pt will benefit from skilled therapeutic  intervention in order to improve on the following deficits Abnormal gait;Decreased range of motion;Impaired flexibility;Decreased endurance;Increased edema;Decreased activity tolerance;Decreased scar mobility;Decreased mobility;Decreased strength   Rehab Potential Good   PT Frequency 2x / week   PT Duration 8 weeks   PT Treatment/Interventions Scar mobilization;Patient/family education;Cryotherapy;Neuromuscular re-education;Manual techniques;Therapeutic exercise;Passive range of motion   PT Next Visit Plan Knee ROM, quad strength, gait and proprioception   Consulted and Agree with Plan of Care Patient        Problem List Patient Active Problem List   Diagnosis Date Noted  . Left quad muscle rupture 02/20/2014    Temisha Murley, PT 05/28/2014, 3:29 PM  [facility] [address] [place], [place], [address] Phone: [phone]   Fax:  [phone]

## 2014-05-30 ENCOUNTER — Ambulatory Visit: Payer: Medicare PPO

## 2014-05-30 DIAGNOSIS — S76112D Strain of left quadriceps muscle, fascia and tendon, subsequent encounter: Secondary | ICD-10-CM | POA: Diagnosis not present

## 2014-05-30 DIAGNOSIS — M25662 Stiffness of left knee, not elsewhere classified: Secondary | ICD-10-CM

## 2014-05-30 DIAGNOSIS — R269 Unspecified abnormalities of gait and mobility: Secondary | ICD-10-CM

## 2014-05-30 DIAGNOSIS — R609 Edema, unspecified: Secondary | ICD-10-CM

## 2014-05-30 DIAGNOSIS — R29898 Other symptoms and signs involving the musculoskeletal system: Secondary | ICD-10-CM

## 2014-05-30 NOTE — Patient Instructions (Signed)
SINGLE LIMB STANCE   Stance: single leg on floor. Raise leg. Hold 10-15___ seconds. Do 5-6 reps 3-4 times a day.  Do this with hands near a counter top or stable object.  Copyright  V[employer]. All rights reserved.

## 2014-05-30 NOTE — Therapy (Signed)
[facility] [address], [place], [place], [address] Phone: [phone]   Fax:  [phone]  Physical Therapy Treatment  Patient Details  Name: Carl [name] MRN: [id number] Date of Birth: Jul 20, 1940 Referring Provider:  Lona Kettle, MD  Encounter Date: 05/30/2014      PT End of Session - 05/30/14 1522    Visit Number 16   Number of Visits 20   Date for PT Re-Evaluation 07/06/14   Authorization Type [employer]   Authorization Time Period visits authorized until 06/24/14   Authorization - Visit Number 13   Authorization - Number of Visits 18   PT Start Time 4982   PT Stop Time 1537   PT Time Calculation (min) 59 min   Activity Tolerance Patient tolerated treatment well   Behavior During Therapy [facility] for tasks assessed/performed      Past Medical History  Diagnosis Date  . Hypertension   . Hyperlipidemia   . Arthritis   . Quadriceps tendon rupture     LEFT    Past Surgical History  Procedure Laterality Date  . Throat surgery  2010  . Quadriceps tendon repair Left 02/20/2014    Procedure: LEFT REPAIR QUADRICEP TENDON;  Surgeon: Mauri Pole, MD;  Location: [facility];  Service: Orthopedics;  Laterality: Left;    There were no vitals filed for this visit.  Visit Diagnosis:  Weakness of left leg  Stiffness of knee joint, left  Edema  Abnormality of gait      Subjective Assessment - 05/30/14 1440    Subjective Feeling good today.   Pain Score 0-No pain                         [facility] Adult PT Treatment/Exercise - 05/30/14 0001    Knee/Hip Exercises: Aerobic   Stationary Bike Lifecycle bike: seat 30, full revolutions x 12 minutes   Knee/Hip Exercises: Standing   Forward Step Up Left;3 sets;10 reps;Hand Hold: 1;Step Height: 6"   Step Down Left;3 sets;10 reps;Hand Hold: 2;Step Height: 6"  tolerated increased step height.  Increaesd challenge   Other Standing Knee Exercises mini  tramp: 3 ways weight shifting x 1 minute each.  UE support   Knee/Hip Exercises: Seated   Long Arc Quad Strengthening;Left;3 sets;10 reps   Long Arc Quad Weight 5 lbs.   Cryotherapy   Number Minutes Cryotherapy 15 Minutes   Cryotherapy Location Knee   Type of Cryotherapy Other (comment)  Game Ready: 3 snowflakes, medium compression    Manual Therapy   Manual Therapy Passive ROM;Myofascial release   Myofascial Release Lt knee flexion and soft tissue stretch at distal quad.   Ankle Exercises: Standing   SLS Lt with UE support as needed 5x 10 seconds   Heel Raises 20 reps                PT Education - 05/30/14 1510    Education provided Yes   Education Details single leg stance   Person(s) Educated Patient   Methods Explanation;Demonstration;Handout   Comprehension Verbalized understanding;Returned demonstration          PT Short Term Goals - 05/21/14 1502    PT SHORT TERM GOAL #1   Title be independent with initial HEP   Time 4   Period Weeks   Status Achieved   PT SHORT TERM GOAL #2   Title demonstrate Lt knee PROM flexion to > or = to 70 degrees  Time 4   Period Weeks   Status Achieved   PT SHORT TERM GOAL #3   Title improve quad strength to wean from walker to use of cane or 1 crutch at least 75% in the home   Time 4   Status Achieved           PT Long Term Goals - 05/28/14 1455    PT LONG TERM GOAL #1   Title be independent in advanced HEP   Time 8   Period Weeks   Status On-going   PT LONG TERM GOAL #3   Title demonstrate Lt knee PROM flexion to > or = to 100 degrees   Time 8   Period Weeks   Status Achieved  101 degrees   PT LONG TERM GOAL #4   Title  improve Lt LE strength to wean from walker to use of cane or 1 crutch at least 50% in the community   Status Achieved  using in the community due to continued Lt knee weakness               Plan - 05/30/14 1456    Clinical Impression Statement Pt with improved quad strength yet  remains unstable in standing.  Pt with improved Lt knee AROM.   Pt will benefit from skilled therapeutic intervention in order to improve on the following deficits Abnormal gait;Decreased range of motion;Impaired flexibility;Decreased endurance;Increased edema;Decreased activity tolerance;Decreased scar mobility;Decreased mobility;Decreased strength   Rehab Potential Good   PT Frequency 2x / week   PT Duration 8 weeks   PT Treatment/Interventions Scar mobilization;Patient/family education;Cryotherapy;Neuromuscular re-education;Manual techniques;Therapeutic exercise;Passive range of motion   PT Next Visit Plan Knee ROM, quad strength, gait and proprioception.  Complete MD note Next session-pt has appointment 06/06/14.          Problem List Patient Active Problem List   Diagnosis Date Noted  . Left quad muscle rupture 02/20/2014    Fitzroy Mikami, PT 05/30/2014, 3:26 PM  [facility] [address] [place], [place], [address] Phone: [phone]   Fax:  [phone]

## 2014-06-04 ENCOUNTER — Ambulatory Visit: Payer: Medicare PPO | Admitting: Physical Therapy

## 2014-06-04 ENCOUNTER — Encounter: Payer: Self-pay | Admitting: Physical Therapy

## 2014-06-04 DIAGNOSIS — S76112D Strain of left quadriceps muscle, fascia and tendon, subsequent encounter: Secondary | ICD-10-CM | POA: Diagnosis not present

## 2014-06-04 DIAGNOSIS — R269 Unspecified abnormalities of gait and mobility: Secondary | ICD-10-CM

## 2014-06-04 DIAGNOSIS — R29898 Other symptoms and signs involving the musculoskeletal system: Secondary | ICD-10-CM

## 2014-06-04 DIAGNOSIS — R609 Edema, unspecified: Secondary | ICD-10-CM

## 2014-06-04 DIAGNOSIS — M25662 Stiffness of left knee, not elsewhere classified: Secondary | ICD-10-CM

## 2014-06-04 NOTE — Therapy (Addendum)
[facility] [address], [place], [place], [address] Phone: [phone]   Fax:  [phone]  Physical Therapy Treatment  Patient Details  Name: Carl [name] MRN: [id number] Date of Birth: 1940/05/29 Referring Provider:  Lona Kettle, MD  Encounter Date: 06/04/2014      PT End of Session - 06/04/14 1502    Visit Number 17   Number of Visits 20   Date for PT Re-Evaluation 07/20/14   Authorization Type [employer]   Authorization Time Period visits authorized until 06/24/14   Authorization - Visit Number 17   Authorization - Number of Visits 18   PT Start Time 5573   PT Stop Time 1537   PT Time Calculation (min) 60 min   Equipment Utilized During Treatment Other (comment)   Activity Tolerance Patient tolerated treatment well   Behavior During Therapy [facility] for tasks assessed/performed      Past Medical History  Diagnosis Date  . Hypertension   . Hyperlipidemia   . Arthritis   . Quadriceps tendon rupture     LEFT    Past Surgical History  Procedure Laterality Date  . Throat surgery  2010  . Quadriceps tendon repair Left 02/20/2014    Procedure: LEFT REPAIR QUADRICEP TENDON;  Surgeon: Mauri Pole, MD;  Location: [facility];  Service: Orthopedics;  Laterality: Left;    There were no vitals filed for this visit.  Visit Diagnosis:  Weakness of left leg - Plan: PT plan of care cert/re-cert  Stiffness of knee joint, left - Plan: PT plan of care cert/re-cert  Edema - Plan: PT plan of care cert/re-cert  Abnormality of gait - Plan: PT plan of care cert/re-cert      Subjective Assessment - 06/04/14 1441    Subjective No new complaints today. Ambulating with axillary crutch.   Currently in Pain? No/denies            [facility] PT Assessment - 06/04/14 0001    Assessment   Medical Diagnosis s/p Lt quadriceps tendon repair   Observation/Other Assessments   Focus on Therapeutic Outcomes (FOTO)  54% CK   PROM   Right/Left Knee Left   Left Knee Flexion 110                     [facility] Adult PT Treatment/Exercise - 06/04/14 0001    Knee/Hip Exercises: Aerobic   Stationary Bike Lifecycle bike: seat 30, full revolutions x 12 minutes   Knee/Hip Exercises: Standing   Forward Step Up Left;3 sets;10 reps;Hand Hold: 1;Step Height: 6"   Step Down Left;3 sets;10 reps;Hand Hold: 2;Step Height: 6"  tolerated increased step height.  Increaesd challenge   Other Standing Knee Exercises mini tramp: 3 ways weight shifting x 1 minute each.  UE support   Knee/Hip Exercises: Seated   Long Arc Quad Strengthening;Left;3 sets;10 reps   Long Arc Quad Weight 5 lbs.   Knee/Hip Exercises: Supine   Straight Leg Raises Strengthening;Left;20 reps   Knee/Hip Exercises: Prone   Hamstring Curl 2 sets;10 reps   Hamstring Curl Limitations 3#   Cryotherapy   Number Minutes Cryotherapy 15 Minutes   Cryotherapy Location Knee   Type of Cryotherapy --  Gameready 3 flakes medium                  PT Short Term Goals - 05/21/14 1502    PT SHORT TERM GOAL #1   Title be independent with initial HEP  Time 4   Period Weeks   Status Achieved   PT SHORT TERM GOAL #2   Title demonstrate Lt knee PROM flexion to > or = to 70 degrees   Time 4   Period Weeks   Status Achieved   PT SHORT TERM GOAL #3   Title improve quad strength to wean from walker to use of cane or 1 crutch at least 75% in the home   Time 4   Status Achieved           PT Long Term Goals - 06/04/14 1451    PT LONG TERM GOAL #1   Title be independent in advanced HEP   Time 8   Period Weeks   Status On-going   PT LONG TERM GOAL #2   Title reduce FOTO to < or = to 49% limitation   Time 6   Period Weeks   Status On-going  54% limitation   PT LONG TERM GOAL #3   Title demonstrate Lt knee PROM flexion to > or = to 100 degrees   Time 8   Period Weeks   Status Achieved   PT LONG TERM GOAL #4   Title  improve Lt LE strength  to wean from walker to use of cane or 1 crutch at least 50% in the community   Time 8   Period Weeks   PT LONG TERM GOAL #5   Title demonstrate minimal gait assymmetry with gait on level surface   Time 6   Status On-going   Additional Long Term Goals   Additional Long Term Goals Yes   PT LONG TERM GOAL #6   Title demonstrate Lt quad stability with gait on level surface without brace or device as allowed by MD   Time 8   Period Weeks   Status Achieved   PT LONG TERM GOAL #7   Title improve Lt quad strength and stability to allow for return to yoga and get  up and down from the floor without difficulty   Time 6   Period Weeks   Status New   PT LONG TERM GOAL #8   Title improve quad stability to wean from crutch for all distances and surfaces   Time 6   Period Weeks   Status New               Plan - 06/04/14 1503    Clinical Impression Statement Patient walking some without device. He would like to be able to get up/down off floor in order to return to yoga class. Flexion continues to improve.   Pt will benefit from skilled therapeutic intervention in order to improve on the following deficits Abnormal gait;Decreased range of motion;Impaired flexibility;Decreased endurance;Increased edema;Decreased activity tolerance;Decreased scar mobility;Decreased mobility;Decreased strength   Rehab Potential Good   PT Frequency 2x / week   PT Duration 6 weeks  through 07/20/14   PT Treatment/Interventions Scar mobilization;Patient/family education;Cryotherapy;Neuromuscular re-education;Manual techniques;Therapeutic exercise;Passive range of motion   PT Next Visit Plan To MD   Consulted and Agree with Plan of Care Patient        Problem List Patient Active Problem List   Diagnosis Date Noted  . Left quad muscle rupture 02/20/2014    Myrene Galas, Carl [name] 06/04/2014 3:56 PM Carl [name], PT 06/04/2014 3:57 PM  [facility] [address] [place], [place], [address] Phone: [phone]   Fax:  [phone]

## 2014-06-04 NOTE — Addendum Note (Signed)
Addended by: Danie Binder on: 06/04/2014 03:57 PM   Modules accepted: Orders

## 2014-06-07 ENCOUNTER — Ambulatory Visit: Payer: Medicare PPO

## 2014-06-07 DIAGNOSIS — R609 Edema, unspecified: Secondary | ICD-10-CM

## 2014-06-07 DIAGNOSIS — M25662 Stiffness of left knee, not elsewhere classified: Secondary | ICD-10-CM

## 2014-06-07 DIAGNOSIS — R29898 Other symptoms and signs involving the musculoskeletal system: Secondary | ICD-10-CM

## 2014-06-07 DIAGNOSIS — R269 Unspecified abnormalities of gait and mobility: Secondary | ICD-10-CM

## 2014-06-07 DIAGNOSIS — S76112D Strain of left quadriceps muscle, fascia and tendon, subsequent encounter: Secondary | ICD-10-CM | POA: Diagnosis not present

## 2014-06-07 NOTE — Therapy (Addendum)
[facility] [address], [place], [place], [address] Phone: [phone]   Fax:  [phone]  Physical Therapy Treatment  Patient Details  Name: Carl [name] MRN: [id number] Date of Birth: 05-Feb-1941 Referring Provider:  Paralee Cancel, MD  Encounter Date: 06/07/2014      PT End of Session - 06/07/14 1515    Visit Number 18   Number of Visits 20   Authorization Type [employer]   Authorization Time Period 4 visits approved until 07/22/14   Authorization - Visit Number 1   Authorization - Number of Visits 4   PT Start Time 6761   PT Stop Time 1541   PT Time Calculation (min) 66 min   Activity Tolerance Patient tolerated treatment well   Behavior During Therapy [facility] for tasks assessed/performed      Past Medical History  Diagnosis Date  . Hypertension   . Hyperlipidemia   . Arthritis   . Quadriceps tendon rupture     LEFT    Past Surgical History  Procedure Laterality Date  . Throat surgery  2010  . Quadriceps tendon repair Left 02/20/2014    Procedure: LEFT REPAIR QUADRICEP TENDON;  Surgeon: Mauri Pole, MD;  Location: [facility];  Service: Orthopedics;  Laterality: Left;    There were no vitals filed for this visit.  Visit Diagnosis:  Weakness of left leg  Stiffness of knee joint, left  Edema  Abnormality of gait      Subjective Assessment - 06/07/14 1452    Subjective Pt reports that he saw MD yesterday.  He wants pt to continue to strengthen his quads.     Currently in Pain? No/denies            [facility] PT Assessment - 06/07/14 0001    Assessment   Medical Diagnosis s/p Lt quadriceps tendon repair   Strength   Left Knee Flexion 5/5   Left Knee Extension 4+/5   Ambulation/Gait   Ambulation/Gait Yes   Ambulation/Gait Assistance 6: Modified independent (Device/Increase time)   Ambulation Distance (Feet) 100 Feet   Assistive device Crutches   Gait Pattern Decreased stance time -  left;Decreased step length - left  quad instability in stance phase of gait due to weakness.   Stairs Yes   Stairs Assistance 6: Modified independent (Device/Increase time)   Stair Management Technique One rail Right;Step to pattern  step to with descending due to quad instability   Number of Stairs 4   Height of Stairs 6                     [facility] Adult PT Treatment/Exercise - 06/07/14 0001    Knee/Hip Exercises: Aerobic   Stationary Bike Lifecycle bike: seat 30, full revolutions x 12 minutes   Knee/Hip Exercises: Machines for Strengthening   Total Gym Leg Press 90# bilateral legs 3x10, Lt only 50# 3x10  seat 9   Knee/Hip Exercises: Standing   Walking with Sports Cord 30# forward and reverse x 15 each   Other Standing Knee Exercises mini tramp: 3 ways weight shifting x 1 minute each.  UE support   Knee/Hip Exercises: Seated   Long Arc Quad Strengthening;Left;3 sets;10 reps   Long Arc Quad Weight 5 lbs.   Cryotherapy   Number Minutes Cryotherapy 15 Minutes   Cryotherapy Location Knee   Type of Cryotherapy Other (comment)  Lt knee  PT Short Term Goals - 05/21/14 1502    PT SHORT TERM GOAL #1   Title be independent with initial HEP   Time 4   Period Weeks   Status Achieved   PT SHORT TERM GOAL #2   Title demonstrate Lt knee PROM flexion to > or = to 70 degrees   Time 4   Period Weeks   Status Achieved   PT SHORT TERM GOAL #3   Title improve quad strength to wean from walker to use of cane or 1 crutch at least 75% in the home   Time 4   Status Achieved           PT Long Term Goals - 06/04/14 1451    PT LONG TERM GOAL #1   Title be independent in advanced HEP   Time 8   Period Weeks   Status On-going   PT LONG TERM GOAL #2   Title reduce FOTO to < or = to 49% limitation   Time 6   Period Weeks   Status On-going  54% limitation   PT LONG TERM GOAL #3   Title demonstrate Lt knee PROM flexion to > or = to 100 degrees    Time 8   Period Weeks   Status Achieved   PT LONG TERM GOAL #4   Title  improve Lt LE strength to wean from walker to use of cane or 1 crutch at least 50% in the community   Time 8   Period Weeks   PT LONG TERM GOAL #5   Title demonstrate minimal gait assymmetry with gait on level surface   Time 6   Status On-going   Additional Long Term Goals   Additional Long Term Goals Yes   PT LONG TERM GOAL #6   Title demonstrate Lt quad stability with gait on level surface without brace or device as allowed by MD   Time 8   Period Weeks   Status Achieved   PT LONG TERM GOAL #7   Title improve Lt quad strength and stability to allow for return to yoga and get  up and down from the floor without difficulty   Time 6   Period Weeks   Status New   PT LONG TERM GOAL #8   Title improve quad stability to wean from crutch for all distances and surfaces   Time 6   Period Weeks   Status New               Plan - 06/07/14 1515    Clinical Impression Statement Pt continues to demonstrate quad instability and inability to descend steps with Lt LE due to weakness.  Pt uses crutch ~50% of the time.  Pt is able to tolerate advanced strength in clinic today without difficulty.    Pt will benefit from skilled therapeutic intervention in order to improve on the following deficits Abnormal gait;Decreased range of motion;Impaired flexibility;Decreased endurance;Increased edema;Decreased activity tolerance;Decreased scar mobility;Decreased mobility;Decreased strength   Rehab Potential Good   PT Frequency 2x / week   PT Duration 6 weeks  through 07/20/14   PT Treatment/Interventions Scar mobilization;Patient/family education;Cryotherapy;Neuromuscular re-education;Manual techniques;Therapeutic exercise;Passive range of motion   PT Next Visit Plan Quad strength, gait and proprioception.  Wean from Game Ready as edema improves.     Consulted and Agree with Plan of Care Patient        Problem  List Patient Active Problem List   Diagnosis Date Noted  . Left  quad muscle rupture 02/20/2014    Macarius Ruark, PT 06/07/2014, 3:45 PM  [facility] [address], [place], [place], [address] Phone: [phone]   Fax:  [phone]

## 2014-06-11 ENCOUNTER — Ambulatory Visit: Payer: Medicare PPO | Attending: Orthopedic Surgery | Admitting: Physical Therapy

## 2014-06-11 ENCOUNTER — Encounter: Payer: Self-pay | Admitting: Physical Therapy

## 2014-06-11 DIAGNOSIS — Z9889 Other specified postprocedural states: Secondary | ICD-10-CM | POA: Diagnosis not present

## 2014-06-11 DIAGNOSIS — R29898 Other symptoms and signs involving the musculoskeletal system: Secondary | ICD-10-CM

## 2014-06-11 DIAGNOSIS — I10 Essential (primary) hypertension: Secondary | ICD-10-CM | POA: Insufficient documentation

## 2014-06-11 DIAGNOSIS — S76112D Strain of left quadriceps muscle, fascia and tendon, subsequent encounter: Secondary | ICD-10-CM | POA: Insufficient documentation

## 2014-06-11 DIAGNOSIS — M25562 Pain in left knee: Secondary | ICD-10-CM | POA: Insufficient documentation

## 2014-06-11 DIAGNOSIS — R269 Unspecified abnormalities of gait and mobility: Secondary | ICD-10-CM | POA: Diagnosis not present

## 2014-06-11 DIAGNOSIS — R609 Edema, unspecified: Secondary | ICD-10-CM | POA: Diagnosis not present

## 2014-06-11 DIAGNOSIS — E785 Hyperlipidemia, unspecified: Secondary | ICD-10-CM | POA: Insufficient documentation

## 2014-06-11 DIAGNOSIS — M25662 Stiffness of left knee, not elsewhere classified: Secondary | ICD-10-CM

## 2014-06-11 NOTE — Therapy (Signed)
[facility] [address] [place], [place], [address] Phone: [phone]   Fax:  [phone]  Physical Therapy Treatment  Patient Details  Name: Carl [name] MRN: [id number] Date of Birth: [birth year] Referring Provider:  Lona Kettle, MD  Encounter Date: 06/11/2014      PT End of Session - 06/11/14 1601    Visit Number 19   Number of Visits 20   Authorization Type [employer]   Authorization Time Period 4 visits approved until 07/22/14   PT Start Time 1524   PT Stop Time 1626   PT Time Calculation (min) 62 min   Equipment Utilized During Treatment Other (comment)   Activity Tolerance Patient tolerated treatment well   Behavior During Therapy [facility] for tasks assessed/performed      Past Medical History  Diagnosis Date  . Hypertension   . Hyperlipidemia   . Arthritis   . Quadriceps tendon rupture     LEFT    Past Surgical History  Procedure Laterality Date  . Throat surgery  2010  . Quadriceps tendon repair Left 02/20/2014    Procedure: LEFT REPAIR QUADRICEP TENDON;  Surgeon: Mauri Pole, MD;  Location: [facility];  Service: Orthopedics;  Laterality: Left;    There were no vitals filed for this visit.  Visit Diagnosis:  Weakness of left leg  Stiffness of knee joint, left  Edema  Abnormality of gait                       [facility] Adult PT Treatment/Exercise - 06/11/14 0001    Ambulation/Gait   Ambulation/Gait Yes   Ambulation/Gait Assistance 6: Modified independent (Device/Increase time)  with focus to increase cadence   Ambulation Distance (Feet) 120 Feet   Gait Pattern Decreased stance time - left   Ambulation Surface Level   Stairs Yes   Stairs Assistance 6: Modified independent (Device/Increase time)   Number of Stairs 4   Height of Stairs 6   Gait Comments practiced sidestepping Lt and Rt 66feet x 1 each direction   Knee/Hip Exercises: Aerobic   Stationary Bike Lifecycle  bike: seat 30, full revolutions x 12 minutes   Knee/Hip Exercises: Machines for Strengthening   Total Gym Leg Press 100# bilateral legs 4x10, Lt only 50# 3x10  pt tolerated increase in weight well   Knee/Hip Exercises: Standing   Forward Lunges Left;2 sets;10 reps  steeping on blue balance pad with 1UE support   Lateral Step Up Left;3 sets;Hand Hold: 2;Step Height: 6"   Forward Step Up Left;3 sets;10 reps;Hand Hold: 1;Step Height: 6"   Step Down Left;3 sets;10 reps;Hand Hold: 2;Step Height: 6"   Walking with Sports Cord 30# forward/reverse, backward/reverse x 10 each    Cryotherapy   Number Minutes Cryotherapy 15 Minutes   Cryotherapy Location Knee  left   Type of Cryotherapy Other (comment)  Game Ready, 3 snowflakes, med compression, Both legs elevate                  PT Short Term Goals - 06/11/14 1609    PT SHORT TERM GOAL #1   Title be independent with initial HEP   Time 4   Period Weeks   Status Achieved   PT SHORT TERM GOAL #2   Title demonstrate Lt knee PROM flexion to > or = to 70 degrees   Time 4   Period Weeks   Status Achieved   PT SHORT TERM GOAL #3  Title improve quad strength to wean from walker to use of cane or 1 crutch at least 75% in the home   Time 4   Period Weeks   Status Achieved           PT Long Term Goals - 06/11/14 1609    PT LONG TERM GOAL #1   Title be independent in advanced HEP   Time 8   Period Weeks   Status On-going   PT LONG TERM GOAL #2   Title reduce FOTO to < or = to 49% limitation   Time 6   Period Weeks   Status On-going   PT LONG TERM GOAL #3   Title demonstrate Lt knee PROM flexion to > or = to 100 degrees   Time 8   Status Achieved   PT LONG TERM GOAL #4   Title  improve Lt LE strength to wean from walker to use of cane or 1 crutch at least 50% in the community   Time 8   Period Weeks   Status Achieved   PT LONG TERM GOAL #5   Title demonstrate minimal gait assymmetry with gait on level surface   Time  6   Period Weeks   Status On-going   PT LONG TERM GOAL #6   Title demonstrate Lt quad stability with gait on level surface without brace or device as allowed by MD   Time 8   Period Weeks   Status Achieved   PT LONG TERM GOAL #7   Title improve Lt quad strength and stability to allow for return to yoga and get  up and down from the floor without difficulty   Time 6   Period Weeks   Status On-going   PT LONG TERM GOAL #8   Title improve quad stability to wean from crutch for all distances and surfaces   Time 6   Period Weeks   Status On-going               Plan - 06/11/14 1603    Clinical Impression Statement Pt had 1 episode of buckeling of left knee during stair activities due to quad instability.    Pt will benefit from skilled therapeutic intervention in order to improve on the following deficits Abnormal gait;Decreased range of motion;Impaired flexibility;Decreased endurance;Increased edema;Decreased activity tolerance;Decreased scar mobility;Decreased mobility;Decreased strength   Rehab Potential Good   PT Frequency 2x / week   PT Duration 6 weeks   PT Treatment/Interventions Scar mobilization;Patient/family education;Cryotherapy;Neuromuscular re-education;Manual techniques;Therapeutic exercise;Passive range of motion   PT Next Visit Plan Quad strength, gait and proprioception.  Wean from Game Ready as edema improves.     Consulted and Agree with Plan of Care Patient        Problem List Patient Active Problem List   Diagnosis Date Noted  . Left quad muscle rupture 02/20/2014    NAUMANN-HOUEGNIFIO,Jakeya Gherardi PTA 06/11/2014, 4:12 PM  [facility] [address] [place], [place], [address] Phone: [phone]   Fax:  [phone]

## 2014-06-18 ENCOUNTER — Encounter: Payer: Self-pay | Admitting: Physical Therapy

## 2014-06-18 ENCOUNTER — Ambulatory Visit: Payer: Medicare PPO | Admitting: Physical Therapy

## 2014-06-18 DIAGNOSIS — R609 Edema, unspecified: Secondary | ICD-10-CM

## 2014-06-18 DIAGNOSIS — S76112D Strain of left quadriceps muscle, fascia and tendon, subsequent encounter: Secondary | ICD-10-CM | POA: Diagnosis not present

## 2014-06-18 DIAGNOSIS — R269 Unspecified abnormalities of gait and mobility: Secondary | ICD-10-CM

## 2014-06-18 DIAGNOSIS — M25662 Stiffness of left knee, not elsewhere classified: Secondary | ICD-10-CM

## 2014-06-18 DIAGNOSIS — R29898 Other symptoms and signs involving the musculoskeletal system: Secondary | ICD-10-CM

## 2014-06-18 NOTE — Therapy (Signed)
[facility] [address] [place], [place], [address] Phone: [phone]   Fax:  [phone]  Physical Therapy Treatment  Patient Details  Name: Carl [name] MRN: [id number] Date of Birth: 01-18-1941 Referring Provider:  Lona Kettle, MD  Encounter Date: 06/18/2014      PT End of Session - 06/18/14 1322    Visit Number 20   Number of Visits 20   Date for PT Re-Evaluation 07/20/14   Authorization Type [employer]   Authorization Time Period 4 visits approved until 07/22/14   Authorization - Visit Number 2   Authorization - Number of Visits 4   PT Start Time 1228   PT Stop Time 1329   PT Time Calculation (min) 61 min   Behavior During Therapy [facility] for tasks assessed/performed      Past Medical History  Diagnosis Date  . Hypertension   . Hyperlipidemia   . Arthritis   . Quadriceps tendon rupture     LEFT    Past Surgical History  Procedure Laterality Date  . Throat surgery  2010  . Quadriceps tendon repair Left 02/20/2014    Procedure: LEFT REPAIR QUADRICEP TENDON;  Surgeon: Mauri Pole, MD;  Location: [facility];  Service: Orthopedics;  Laterality: Left;    There were no vitals filed for this visit.  Visit Diagnosis:  Weakness of left leg  Stiffness of knee joint, left  Edema  Abnormality of gait      Subjective Assessment - 06/18/14 1303    Subjective pt feels stronger every day, but descending stairs is still challenging   Pertinent History Pt reports that he had a fall when he turned to turn a lamp off.  February 10, 2014.     Limitations Standing;Walking  descending staiirs            [facility] PT Assessment - 06/18/14 0001    Observation/Other Assessments   Focus on Therapeutic Outcomes (FOTO)  66%limitations                     [facility] Adult PT Treatment/Exercise - 06/18/14 0001    Knee/Hip Exercises: Stretches   Active Hamstring Stretch 3 reps;20 seconds  with Lt LE     Knee/Hip Exercises: Aerobic   Stationary Bike Lifecycle bike: seat 30, full revolutions x 12 minutes   Knee/Hip Exercises: Machines for Strengthening   Total Gym Leg Press 110# bilateral legs 5x10, Lt only 55# 3x10   Knee/Hip Exercises: Standing   Forward Lunges Left;10 reps;1 set  with one UE support   Lateral Step Up Left;3 sets;Hand Hold: 2;Step Height: 6"   Forward Step Up Left;3 sets;10 reps;Hand Hold: 1;Step Height: 6"   Step Down Left;3 sets;10 reps;Hand Hold: 2;Step Height: 6"   Modalities   Modalities --   Cryotherapy   Number Minutes Cryotherapy 15 Minutes   Cryotherapy Location Knee   Type of Cryotherapy Other (comment)  Game Ready, 3snowflakes, med compression, B LE elevated                  PT Short Term Goals - 06/18/14 1326    PT SHORT TERM GOAL #1   Title be independent with initial HEP   Time 4   Period Weeks   Status Achieved   PT SHORT TERM GOAL #2   Title demonstrate Lt knee PROM flexion to > or = to 70 degrees   Time 4   Period Weeks   Status Achieved  PT SHORT TERM GOAL #3   Title improve quad strength to wean from walker to use of cane or 1 crutch at least 75% in the home   Time 4   Period Weeks   Status Achieved           PT Long Term Goals - 2014/07/09 1326    PT LONG TERM GOAL #1   Title be independent in advanced HEP   Time 8   Period Weeks   Status On-going   PT LONG TERM GOAL #2   Title reduce FOTO to < or = to 49% limitation  As of 07-09-14 pt scored 66%   Time 6   Period Weeks   Status On-going   PT LONG TERM GOAL #3   Title demonstrate Lt knee PROM flexion to > or = to 100 degrees   Time 8   Period Weeks   Status Achieved   PT LONG TERM GOAL #4   Title  improve Lt LE strength to wean from walker to use of cane or 1 crutch at least 50% in the community   Time 8   Period Weeks   Status Achieved   PT LONG TERM GOAL #5   Title demonstrate minimal gait assymmetry with gait on level surface   Time 6   Period Weeks    Status On-going   PT LONG TERM GOAL #6   Title demonstrate Lt quad stability with gait on level surface without brace or device as allowed by MD   Time 8   Period Weeks   Status Achieved   PT LONG TERM GOAL #7   Title improve Lt quad strength and stability to allow for return to yoga and get  up and down from the floor without difficulty   Time 6   Period Weeks   Status On-going   PT LONG TERM GOAL #8   Title improve quad stability to wean from crutch for all distances and surfaces   Time 6   Period Weeks   Status On-going               Plan - Jul 09, 2014 1323    Clinical Impression Statement Pt  presents with weakness with Lt knee activities as lunges he needs 1 UE support, he will continue to improve with PT for strengthening and endurance   Pt will benefit from skilled therapeutic intervention in order to improve on the following deficits Abnormal gait;Decreased range of motion;Impaired flexibility;Decreased endurance;Increased edema;Decreased activity tolerance;Decreased scar mobility;Decreased mobility;Decreased strength   Rehab Potential Good   PT Frequency 2x / week   PT Duration 6 weeks   PT Treatment/Interventions Scar mobilization;Patient/family education;Cryotherapy;Neuromuscular re-education;Manual techniques;Therapeutic exercise;Passive range of motion   PT Next Visit Plan Quad strength, gait and proprioception.  Wean from Game Ready as edema improves.     Consulted and Agree with Plan of Care Patient          G-Codes - 2014-07-09 1401    Functional Assessment Tool Used FOTO Score is 66% limitation   Functional Limitation Mobility: Walking and moving around   Mobility: Walking and Moving Around Current Status [phone]) At least 60 percent but less than 80 percent impaired, limited or restricted   Mobility: Walking and Moving Around Goal Status [phone]) At least 40 percent but less than 60 percent impaired, limited or restricted      Problem List Patient Active  Problem List   Diagnosis Date Noted  . Left quad muscle rupture 02/20/2014  Earlie Counts, PT 06/18/2014 2:02 PM   GRAY,CHERYL PTA 06/18/2014, 2:02 PM  [facility] [address] [place], [place], [address] Phone: [phone]   Fax:  [phone]

## 2014-06-20 ENCOUNTER — Ambulatory Visit: Payer: Medicare PPO

## 2014-06-20 DIAGNOSIS — R29898 Other symptoms and signs involving the musculoskeletal system: Secondary | ICD-10-CM

## 2014-06-20 DIAGNOSIS — S76112D Strain of left quadriceps muscle, fascia and tendon, subsequent encounter: Secondary | ICD-10-CM | POA: Diagnosis not present

## 2014-06-20 DIAGNOSIS — R609 Edema, unspecified: Secondary | ICD-10-CM

## 2014-06-20 DIAGNOSIS — M25662 Stiffness of left knee, not elsewhere classified: Secondary | ICD-10-CM

## 2014-06-20 DIAGNOSIS — R269 Unspecified abnormalities of gait and mobility: Secondary | ICD-10-CM

## 2014-06-20 NOTE — Therapy (Signed)
[facility] [address] [place], [place], [address] Phone: [phone]   Fax:  [phone]  Physical Therapy Treatment  Patient Details  Name: Carl [name] MRN: [id number] Date of Birth: 1940/09/19 Referring Provider:  Lona Kettle, MD  Encounter Date: 06/20/2014      PT End of Session - 06/20/14 1525    Visit Number 21   Number of Visits 30   Date for PT Re-Evaluation 07/20/14   Authorization Type [employer]   Authorization Time Period 4 visits approved until 07/22/14   Authorization - Visit Number 3   Authorization - Number of Visits 4   PT Start Time 3762   PT Stop Time 1540   PT Time Calculation (min) 57 min   Activity Tolerance Patient tolerated treatment well   Behavior During Therapy [facility] for tasks assessed/performed      Past Medical History  Diagnosis Date  . Hypertension   . Hyperlipidemia   . Arthritis   . Quadriceps tendon rupture     LEFT    Past Surgical History  Procedure Laterality Date  . Throat surgery  2010  . Quadriceps tendon repair Left 02/20/2014    Procedure: LEFT REPAIR QUADRICEP TENDON;  Surgeon: Mauri Pole, MD;  Location: [facility];  Service: Orthopedics;  Laterality: Left;    There were no vitals filed for this visit.  Visit Diagnosis:  Weakness of left leg  Stiffness of knee joint, left  Edema  Abnormality of gait      Subjective Assessment - 06/20/14 1448    Subjective Pt states that his knee is feeling "looser" and he is feeling better each day. He states that he still notices weakness in legs when standing up. No pain reported currently.    Pertinent History Pt reports that he had a fall when he turned to turn a lamp off.  February 10, 2014.     Limitations Standing;Walking   How long can you walk comfortably? At least 30 minutes secondary to decreased endurance   Patient Stated Goals Increase leg strength   Currently in Pain? No/denies                          [facility] Adult PT Treatment/Exercise - 06/20/14 0001    Transfers   Sit to Stand 7: Independent;With upper extremity assist  3x5 due to fatigue, required 2 hand assist   Knee/Hip Exercises: Stretches   Active Hamstring Stretch 3 reps;20 seconds   Knee/Hip Exercises: Aerobic   Stationary Bike Lifecycle at seat 27, full revolutions x 12 min   Knee/Hip Exercises: Machines for Strengthening   Total Gym Leg Press 115# bilateral  5x10   Knee/Hip Exercises: Standing   Forward Lunges 3 sets;10 reps;Both  with one UE support   Lateral Step Up Hand Hold: 1;Step Height: 6"   Forward Step Up Left;3 sets;10 reps;Hand Hold: 1  Able to do without handhold; need only for balance   Step Down Left;3 sets;10 reps   Cryotherapy   Number Minutes Cryotherapy 15 Minutes   Cryotherapy Location Knee   Type of Cryotherapy Other (comment)  Gameready, 3 snowflakes, med compression, B LE elevated                  PT Short Term Goals - 06/20/14 1524    PT SHORT TERM GOAL #1   Period Weeks   Status On-going  PT Long Term Goals - 06/18/14 1326    PT LONG TERM GOAL #1   Title be independent in advanced HEP   Time 8   Period Weeks   Status On-going   PT LONG TERM GOAL #2   Title reduce FOTO to < or = to 49% limitation  As of 06/18/2014 pt scored 66%   Time 6   Period Weeks   Status On-going   PT LONG TERM GOAL #3   Title demonstrate Lt knee PROM flexion to > or = to 100 degrees   Time 8   Period Weeks   Status Achieved   PT LONG TERM GOAL #4   Title  improve Lt LE strength to wean from walker to use of cane or 1 crutch at least 50% in the community   Time 8   Period Weeks   Status Achieved   PT LONG TERM GOAL #5   Title demonstrate minimal gait assymmetry with gait on level surface   Time 6   Period Weeks   Status On-going   PT LONG TERM GOAL #6   Title demonstrate Lt quad stability with gait on level surface without brace or device  as allowed by MD   Time 8   Period Weeks   Status Achieved   PT LONG TERM GOAL #7   Title improve Lt quad strength and stability to allow for return to yoga and get  up and down from the floor without difficulty   Time 6   Period Weeks   Status On-going   PT LONG TERM GOAL #8   Title improve quad stability to wean from crutch for all distances and surfaces   Time 6   Period Weeks   Status On-going               Plan - 06/20/14 1526    Clinical Impression Statement Pt continues to c/o weakness in B legs. Able to perform step up exercises with decreased hand support and increased weight with leg press; but pt had difficulty with sit to stand (3x5). Pt is progressing with exercises but still requires strengthening for  functional activities (sit > stand)   Pt will benefit from skilled therapeutic intervention in order to improve on the following deficits Abnormal gait;Decreased range of motion;Impaired flexibility;Decreased endurance;Increased edema;Decreased activity tolerance;Decreased scar mobility;Decreased mobility;Decreased strength   Rehab Potential Good   PT Frequency 2x / week   PT Duration 6 weeks   PT Treatment/Interventions Scar mobilization;Patient/family education;Cryotherapy;Neuromuscular re-education;Manual techniques;Therapeutic exercise;Passive range of motion   PT Next Visit Plan Quad strength, gait and proprioception, functional activities.  Plan discharge next session. G codes.    Consulted and Agree with Plan of Care Patient        Problem List Patient Active Problem List   Diagnosis Date Noted  . Left quad muscle rupture 02/20/2014    Raja Liska, PT 06/20/2014, 3:36 PM  [facility] [address] [place], [place], [address] Phone: [phone]   Fax:  [phone]

## 2014-06-25 ENCOUNTER — Ambulatory Visit: Payer: Medicare PPO

## 2014-06-25 DIAGNOSIS — R269 Unspecified abnormalities of gait and mobility: Secondary | ICD-10-CM

## 2014-06-25 DIAGNOSIS — S76112D Strain of left quadriceps muscle, fascia and tendon, subsequent encounter: Secondary | ICD-10-CM | POA: Diagnosis not present

## 2014-06-25 DIAGNOSIS — R29898 Other symptoms and signs involving the musculoskeletal system: Secondary | ICD-10-CM

## 2014-06-25 DIAGNOSIS — R609 Edema, unspecified: Secondary | ICD-10-CM

## 2014-06-25 NOTE — Therapy (Addendum)
[facility] [address] [place], [place], [address] Phone: [phone]   Fax:  [phone]  Physical Therapy Treatment  Patient Details  Name: Carl [name] MRN: [id number] Date of Birth: 05-May-1940 Referring Provider:  Lona Kettle, MD  Encounter Date: 06/25/2014      PT End of Session - 06/25/14 1521    Visit Number 22   Number of Visits 30   Date for PT Re-Evaluation 07/20/14   Authorization Type [employer]   Authorization Time Period 4 visits approved until 07/22/14   Authorization - Visit Number 4   Authorization - Number of Visits 4   PT Start Time 7902   PT Stop Time 1535   PT Time Calculation (min) 59 min   Activity Tolerance Patient tolerated treatment well   Behavior During Therapy [facility] for tasks assessed/performed      Past Medical History  Diagnosis Date  . Hypertension   . Hyperlipidemia   . Arthritis   . Quadriceps tendon rupture     LEFT    Past Surgical History  Procedure Laterality Date  . Throat surgery  2010  . Quadriceps tendon repair Left 02/20/2014    Procedure: LEFT REPAIR QUADRICEP TENDON;  Surgeon: Mauri Pole, MD;  Location: [facility];  Service: Orthopedics;  Laterality: Left;    There were no vitals filed for this visit.  Visit Diagnosis:  Weakness of left leg  Abnormality of gait  Edema      Subjective Assessment - 06/25/14 1445    Subjective Pt reports that "everything has been going good." Continues to feel weak in legs.   Pertinent History Pt reports that he had a fall when he turned to turn a lamp off.  February 10, 2014.     How long can you walk comfortably? 30 minutes    Patient Stated Goals Increase leg strength   Currently in Pain? No/denies            [facility] PT Assessment - 06/25/14 0001    Assessment   Medical Diagnosis s/p Lt quadriceps tendon repair   Observation/Other Assessments   Focus on Therapeutic Outcomes (FOTO)  66%limitations   PROM    Overall PROM  Within functional limits for tasks performed   Left Knee Flexion --  116   Strength   Left Knee Flexion 4+/5   Left Knee Extension 4+/5   Ambulation/Gait   Ambulation/Gait Yes   Ambulation/Gait Assistance 6: Modified independent (Device/Increase time)   Ambulation Distance (Feet) 100 Feet   Gait Pattern Step-through pattern;Wide base of support  Some instability noted with ambulation                     [facility] Adult PT Treatment/Exercise - 06/25/14 0001    Transfers   Sit to Stand 7: Independent  3x10; at raised treatment table, no arm support   Knee/Hip Exercises: Aerobic   Stationary Bike Lifecycle at seat 27, full revolutions x 10 min   Knee/Hip Exercises: Machines for Strengthening   Total Gym Leg Press 120# bilateral, 5x10   Knee/Hip Exercises: Standing   Forward Lunges 3 sets;10 reps;Both  with one UE support   Lateral Step Up Hand Hold: 1;Step Height: 6";Left   Forward Step Up Left;3 sets;10 reps;Hand Hold: 1   Step Down Left;3 sets;10 reps   Knee/Hip Exercises: Sidelying   Clams 3x10, Bil   Cryotherapy   Number Minutes Cryotherapy 15 Minutes   Cryotherapy Location Knee  Lt   Type of Cryotherapy Other (comment)  Game Ready 3 snowflakes med compression                   PT Short Term Goals - 07/12/2014 1459    PT SHORT TERM GOAL #1   Title be independent with initial HEP   Time 4   Period Weeks   Status Achieved   PT SHORT TERM GOAL #3   Title improve quad strength to wean from walker to use of cane or 1 crutch at least 75% in the home   Status Achieved           PT Long Term Goals - Jul 12, 2014 1459    PT LONG TERM GOAL #1   Title be independent in advanced HEP   Status Achieved   PT LONG TERM GOAL #2   Title reduce FOTO to < or = to 49% limitation   Time 6   Period Weeks   Status Partially Met   PT LONG TERM GOAL #4   Title  improve Lt LE strength to wean from walker to use of cane or 1 crutch at least 50% in  the community   Status Achieved   PT LONG TERM GOAL #7   Title improve Lt quad strength and stability to allow for return to yoga and get  up and down from the floor without difficulty   Status Partially Met  Has not returned to yoga secondary to limited ROM and strength               Plan - 07-12-14 1522    Clinical Impression Statement Pt states that he still continues to feel weak and is limited transferring to the floor secondary to limited ROM. Strength maintained at 4+/5 and Lt flexion ROM improved to 116.  Pt tolerated exercise session well and able to complete new clam exercises for LE strengthening. Gait observed to be functional with some instabilty. Pt advised to return to yoga and continue with gym exercises to improve LE strength and flexibilty.    Pt will benefit from skilled therapeutic intervention in order to improve on the following deficits Abnormal gait;Decreased range of motion;Impaired flexibility;Decreased endurance;Increased edema;Decreased activity tolerance;Decreased scar mobility;Decreased mobility;Decreased strength   Rehab Potential Good   PT Frequency 2x / week   PT Duration 6 weeks   PT Next Visit Plan D/C PT   Consulted and Agree with Plan of Care Patient          G-Codes - July 12, 2014 1451    Functional Assessment Tool Used FOTO Score is 66% limitation and clinical judgement   Functional Limitation Mobility: Walking and moving around   Mobility: Walking and Moving Around Goal Status [phone]) At least 40 percent but less than 60 percent impaired, limited or restricted   Mobility: Walking and Moving Around Discharge Status [phone]) At least 40 percent but less than 60 percent impaired, limited or restricted      Problem List Patient Active Problem List   Diagnosis Date Noted  . Left quad muscle rupture 02/20/2014    Reginal Lutes, SPT 2014-07-12 3:58 PM PHYSICAL THERAPY DISCHARGE SUMMARY  Visits from Start of Care: 22  Current functional  level related to goals / functional outcomes: See above for goal assessment.  Pt with functional quad weakness and mild instability with gait.  Pt has not returned to yoga due to weakness and limited Lt knee AROM.   Remaining deficits: See above.  Pt has HEP  and gym exercise program in place to address remaining deficits.     Education / Equipment: HEP Plan: Patient agrees to discharge.  Patient goals were partially met. Patient is being discharged due to being pleased with the current functional level.  ?????    Sigurd Sos, PT 06/25/2014 3:58 PM  [facility] [address], [place], [place], [address] Phone: [phone]   Fax:  [phone]

## 2014-08-22 DIAGNOSIS — R55 Syncope and collapse: Secondary | ICD-10-CM | POA: Diagnosis not present

## 2014-08-22 DIAGNOSIS — Z0001 Encounter for general adult medical examination with abnormal findings: Secondary | ICD-10-CM | POA: Diagnosis not present

## 2014-08-22 DIAGNOSIS — D485 Neoplasm of uncertain behavior of skin: Secondary | ICD-10-CM | POA: Diagnosis not present

## 2014-08-22 DIAGNOSIS — I10 Essential (primary) hypertension: Secondary | ICD-10-CM | POA: Diagnosis not present

## 2014-08-22 DIAGNOSIS — E780 Pure hypercholesterolemia: Secondary | ICD-10-CM | POA: Diagnosis not present

## 2014-08-22 DIAGNOSIS — R748 Abnormal levels of other serum enzymes: Secondary | ICD-10-CM | POA: Diagnosis not present

## 2014-09-11 DIAGNOSIS — D485 Neoplasm of uncertain behavior of skin: Secondary | ICD-10-CM | POA: Diagnosis not present

## 2014-09-11 DIAGNOSIS — L820 Inflamed seborrheic keratosis: Secondary | ICD-10-CM | POA: Diagnosis not present

## 2014-09-17 DIAGNOSIS — T7840XA Allergy, unspecified, initial encounter: Secondary | ICD-10-CM | POA: Diagnosis not present

## 2014-09-20 DIAGNOSIS — R0683 Snoring: Secondary | ICD-10-CM | POA: Diagnosis not present

## 2014-09-24 DIAGNOSIS — R55 Syncope and collapse: Secondary | ICD-10-CM | POA: Diagnosis not present

## 2014-09-24 DIAGNOSIS — I10 Essential (primary) hypertension: Secondary | ICD-10-CM | POA: Diagnosis not present

## 2014-09-25 DIAGNOSIS — E668 Other obesity: Secondary | ICD-10-CM | POA: Diagnosis not present

## 2014-09-25 DIAGNOSIS — R55 Syncope and collapse: Secondary | ICD-10-CM | POA: Diagnosis not present

## 2014-09-25 DIAGNOSIS — I10 Essential (primary) hypertension: Secondary | ICD-10-CM | POA: Diagnosis not present

## 2014-10-15 ENCOUNTER — Emergency Department (HOSPITAL_COMMUNITY): Payer: Medicare PPO

## 2014-10-15 ENCOUNTER — Encounter (HOSPITAL_COMMUNITY)
Admission: EM | Disposition: A | Discharge status: Home / Self Care | Payer: Self-pay | Source: Home / Self Care | Attending: Cardiology

## 2014-10-15 ENCOUNTER — Ambulatory Visit (HOSPITAL_COMMUNITY): Admit: 2014-10-15 | Payer: Self-pay | Admitting: Cardiology

## 2014-10-15 ENCOUNTER — Encounter (HOSPITAL_COMMUNITY): Payer: Self-pay | Admitting: *Deleted

## 2014-10-15 ENCOUNTER — Inpatient Hospital Stay (HOSPITAL_COMMUNITY)
Admission: EM | Admit: 2014-10-15 | Discharge: 2014-10-19 | DRG: 246 | Disposition: A | Discharge status: Home / Self Care | Payer: Medicare PPO | Attending: Cardiology | Admitting: Cardiology

## 2014-10-15 DIAGNOSIS — R579 Shock, unspecified: Secondary | ICD-10-CM | POA: Diagnosis not present

## 2014-10-15 DIAGNOSIS — I2510 Atherosclerotic heart disease of native coronary artery without angina pectoris: Secondary | ICD-10-CM | POA: Diagnosis present

## 2014-10-15 DIAGNOSIS — I2109 ST elevation (STEMI) myocardial infarction involving other coronary artery of anterior wall: Secondary | ICD-10-CM | POA: Diagnosis present

## 2014-10-15 DIAGNOSIS — Z955 Presence of coronary angioplasty implant and graft: Secondary | ICD-10-CM

## 2014-10-15 DIAGNOSIS — R578 Other shock: Secondary | ICD-10-CM | POA: Diagnosis not present

## 2014-10-15 DIAGNOSIS — Z23 Encounter for immunization: Secondary | ICD-10-CM

## 2014-10-15 DIAGNOSIS — R7309 Other abnormal glucose: Secondary | ICD-10-CM | POA: Diagnosis not present

## 2014-10-15 DIAGNOSIS — M1990 Unspecified osteoarthritis, unspecified site: Secondary | ICD-10-CM | POA: Diagnosis not present

## 2014-10-15 DIAGNOSIS — Z6834 Body mass index (BMI) 34.0-34.9, adult: Secondary | ICD-10-CM

## 2014-10-15 DIAGNOSIS — I213 ST elevation (STEMI) myocardial infarction of unspecified site: Secondary | ICD-10-CM

## 2014-10-15 DIAGNOSIS — Z79899 Other long term (current) drug therapy: Secondary | ICD-10-CM | POA: Diagnosis not present

## 2014-10-15 DIAGNOSIS — E785 Hyperlipidemia, unspecified: Secondary | ICD-10-CM | POA: Diagnosis present

## 2014-10-15 DIAGNOSIS — R079 Chest pain, unspecified: Secondary | ICD-10-CM | POA: Diagnosis not present

## 2014-10-15 DIAGNOSIS — I10 Essential (primary) hypertension: Secondary | ICD-10-CM | POA: Diagnosis not present

## 2014-10-15 HISTORY — PX: CARDIAC CATHETERIZATION: SHX172

## 2014-10-15 LAB — POCT I-STAT TROPONIN I: Troponin i, poc: 1.12 ng/mL (ref 0.00–0.08)

## 2014-10-15 LAB — CBC WITH DIFFERENTIAL/PLATELET
Basophils Absolute: 0 10*3/uL (ref 0.0–0.1)
Basophils Relative: 0 % (ref 0–1)
Eosinophils Absolute: 0 10*3/uL (ref 0.0–0.7)
Eosinophils Relative: 0 % (ref 0–5)
HCT: 40.9 % (ref 39.0–52.0)
Hemoglobin: 13.4 g/dL (ref 13.0–17.0)
Lymphocytes Relative: 10 % — ABNORMAL LOW (ref 12–46)
Lymphs Abs: 1.3 10*3/uL (ref 0.7–4.0)
MCH: 31 pg (ref 26.0–34.0)
MCHC: 32.8 g/dL (ref 30.0–36.0)
MCV: 94.7 fL (ref 78.0–100.0)
Monocytes Absolute: 1.1 10*3/uL — ABNORMAL HIGH (ref 0.1–1.0)
Monocytes Relative: 8 % (ref 3–12)
Neutro Abs: 11.4 10*3/uL — ABNORMAL HIGH (ref 1.7–7.7)
Neutrophils Relative %: 82 % — ABNORMAL HIGH (ref 43–77)
Platelets: 213 10*3/uL (ref 150–400)
RBC: 4.32 MIL/uL (ref 4.22–5.81)
RDW: 12.8 % (ref 11.5–15.5)
WBC: 13.8 10*3/uL — ABNORMAL HIGH (ref 4.0–10.5)

## 2014-10-15 LAB — COMPREHENSIVE METABOLIC PANEL
ALT: 43 U/L (ref 17–63)
ALT: 110 U/L — ABNORMAL HIGH (ref 17–63)
AST: 59 U/L — ABNORMAL HIGH (ref 15–41)
AST: 531 U/L — ABNORMAL HIGH (ref 15–41)
Albumin: 3.8 g/dL (ref 3.5–5.0)
Albumin: 3.1 g/dL — ABNORMAL LOW (ref 3.5–5.0)
Alkaline Phosphatase: 50 U/L (ref 38–126)
Alkaline Phosphatase: 43 U/L (ref 38–126)
Anion gap: 11 (ref 5–15)
Anion gap: 10 (ref 5–15)
BUN: 11 mg/dL (ref 6–20)
BUN: 13 mg/dL (ref 6–20)
CO2: 20 mmol/L — ABNORMAL LOW (ref 22–32)
CO2: 19 mmol/L — ABNORMAL LOW (ref 22–32)
Calcium: 8.8 mg/dL — ABNORMAL LOW (ref 8.9–10.3)
Calcium: 7.9 mg/dL — ABNORMAL LOW (ref 8.9–10.3)
Chloride: 106 mmol/L (ref 101–111)
Chloride: 107 mmol/L (ref 101–111)
Creatinine, Ser: 1.13 mg/dL (ref 0.61–1.24)
Creatinine, Ser: 1.18 mg/dL (ref 0.61–1.24)
GFR calc Af Amer: >60 mL/min (ref >60)
GFR calc Af Amer: >60 mL/min (ref >60)
GFR calc non Af Amer: >60 mL/min (ref >60)
GFR calc non Af Amer: 58 mL/min — ABNORMAL LOW (ref >60)
Glucose, Bld: 133 mg/dL — ABNORMAL HIGH (ref 65–99)
Glucose, Bld: 155 mg/dL — ABNORMAL HIGH (ref 65–99)
Potassium: 3.1 mmol/L — ABNORMAL LOW (ref 3.5–5.1)
Potassium: 4.3 mmol/L (ref 3.5–5.1)
Sodium: 137 mmol/L (ref 135–145)
Sodium: 136 mmol/L (ref 135–145)
Total Bilirubin: 1.1 mg/dL (ref 0.3–1.2)
Total Bilirubin: 1.2 mg/dL (ref 0.3–1.2)
Total Protein: 7.1 g/dL (ref 6.5–8.1)
Total Protein: 6.3 g/dL — ABNORMAL LOW (ref 6.5–8.1)

## 2014-10-15 LAB — POCT I-STAT, CHEM 8
BUN: 14 mg/dL (ref 6–20)
Calcium, Ion: 1.09 mmol/L — ABNORMAL LOW (ref 1.13–1.30)
Chloride: 105 mmol/L (ref 101–111)
Creatinine, Ser: 0.9 mg/dL (ref 0.61–1.24)
Glucose, Bld: 157 mg/dL — ABNORMAL HIGH (ref 65–99)
HCT: 46 % (ref 39.0–52.0)
Hemoglobin: 15.6 g/dL (ref 13.0–17.0)
Potassium: 3 mmol/L — ABNORMAL LOW (ref 3.5–5.1)
Sodium: 141 mmol/L (ref 135–145)
TCO2: 17 mmol/L (ref 0–100)

## 2014-10-15 LAB — LIPID PANEL
Cholesterol: 142 mg/dL (ref 0–200)
HDL: 48 mg/dL (ref >40)
LDL Cholesterol: 84 mg/dL (ref 0–99)
Total CHOL/HDL Ratio: 3 RATIO
Triglycerides: 49 mg/dL (ref <150)
VLDL: 10 mg/dL (ref 0–40)

## 2014-10-15 LAB — CBC
HCT: 46.4 % (ref 39.0–52.0)
Hemoglobin: 15.6 g/dL (ref 13.0–17.0)
MCH: 32.1 pg (ref 26.0–34.0)
MCHC: 33.6 g/dL (ref 30.0–36.0)
MCV: 95.5 fL (ref 78.0–100.0)
Platelets: 215 10*3/uL (ref 150–400)
RBC: 4.86 MIL/uL (ref 4.22–5.81)
RDW: 12.9 % (ref 11.5–15.5)
WBC: 9.4 10*3/uL (ref 4.0–10.5)

## 2014-10-15 LAB — PROTIME-INR
INR: 1.12 (ref 0.00–1.49)
Prothrombin Time: 14.5 seconds (ref 11.6–15.2)

## 2014-10-15 LAB — POCT ACTIVATED CLOTTING TIME
Activated Clotting Time: 380 seconds
Activated Clotting Time: 134 seconds

## 2014-10-15 LAB — GLUCOSE, CAPILLARY
Glucose-Capillary: 161 mg/dL — ABNORMAL HIGH (ref 65–99)
Glucose-Capillary: 153 mg/dL — ABNORMAL HIGH (ref 65–99)

## 2014-10-15 LAB — TROPONIN I
Troponin I: >65 ng/mL (ref <0.031)
Troponin I: >65 ng/mL (ref <0.031)
Troponin I: >65 ng/mL (ref <0.031)

## 2014-10-15 LAB — CBG MONITORING, ED: Glucose-Capillary: 98 mg/dL (ref 65–99)

## 2014-10-15 LAB — APTT: aPTT: 29 seconds (ref 24–37)

## 2014-10-15 LAB — MRSA PCR SCREENING: MRSA by PCR: NEGATIVE

## 2014-10-15 SURGERY — LEFT HEART CATH AND CORONARY ANGIOGRAPHY
Anesthesia: LOCAL

## 2014-10-15 MED ORDER — NITROGLYCERIN IN D5W 200-5 MCG/ML-% IV SOLN: 5.0000 ug/min | INTRAVENOUS | Status: DC

## 2014-10-15 MED ORDER — NITROGLYCERIN 0.4 MG SL SUBL: 0.4000 mg | SUBLINGUAL_TABLET | SUBLINGUAL | Status: DC | PRN

## 2014-10-15 MED ORDER — SODIUM CHLORIDE 0.9 % IV SOLN
INTRAVENOUS | Status: DC
  Administered 2014-10-15: 17:00:00 via INTRAVENOUS
  Administered 2014-10-15: 10 mL/h via INTRAVENOUS

## 2014-10-15 MED ORDER — AMIODARONE HCL 150 MG/3ML IV SOLN
INTRAVENOUS | Status: DC | PRN
  Administered 2014-10-15: 150 mg via INTRAVENOUS

## 2014-10-15 MED ORDER — DOPAMINE-DEXTROSE 3.2-5 MG/ML-% IV SOLN
INTRAVENOUS | Status: DC | PRN
  Administered 2014-10-15: 5 ug/kg/min via INTRAVENOUS

## 2014-10-15 MED ORDER — ATORVASTATIN CALCIUM 40 MG PO TABS
40.0000 mg | ORAL_TABLET | Freq: Every day | ORAL | Status: DC
  Administered 2014-10-15 – 2014-10-18 (×4): 40 mg via ORAL
  Filled 2014-10-15 (×6): qty 1

## 2014-10-15 MED ORDER — FENTANYL CITRATE (PF) 100 MCG/2ML IJ SOLN
50.0000 ug | Freq: Once | INTRAMUSCULAR | Status: AC
Start: 2014-10-15 — End: 2014-10-15
  Administered 2014-10-15: 50 ug via INTRAVENOUS

## 2014-10-15 MED ORDER — LIDOCAINE HCL (PF) 1 % IJ SOLN
INTRAMUSCULAR | Status: AC
  Filled 2014-10-15: qty 30

## 2014-10-15 MED ORDER — DOPAMINE-DEXTROSE 3.2-5 MG/ML-% IV SOLN
INTRAVENOUS | Status: AC
  Filled 2014-10-15: qty 250

## 2014-10-15 MED ORDER — NITROGLYCERIN IN D5W 200-5 MCG/ML-% IV SOLN
INTRAVENOUS | Status: AC
  Filled 2014-10-15: qty 250

## 2014-10-15 MED ORDER — INSULIN ASPART 100 UNIT/ML ~~LOC~~ SOLN
0.0000 [IU] | Freq: Three times a day (TID) | SUBCUTANEOUS | Status: DC
  Administered 2014-10-15: 1 [IU] via SUBCUTANEOUS
  Administered 2014-10-15 (×2): 2 [IU] via SUBCUTANEOUS
  Administered 2014-10-16 – 2014-10-19 (×3): 1 [IU] via SUBCUTANEOUS

## 2014-10-15 MED ORDER — ACETAMINOPHEN 325 MG PO TABS: 650.0000 mg | ORAL_TABLET | ORAL | Status: DC | PRN

## 2014-10-15 MED ORDER — BIVALIRUDIN 250 MG IV SOLR
250.0000 mg | INTRAVENOUS | Status: DC | PRN
  Administered 2014-10-15: 1.75 mg/kg/h via INTRAVENOUS
  Administered 2014-10-15: 250 mg

## 2014-10-15 MED ORDER — NITROGLYCERIN IN D5W 200-5 MCG/ML-% IV SOLN
INTRAVENOUS | Status: DC | PRN
  Administered 2014-10-15: 5 ug/min via INTRAVENOUS

## 2014-10-15 MED ORDER — AMIODARONE HCL 150 MG/3ML IV SOLN
INTRAVENOUS | Status: AC
  Filled 2014-10-15: qty 3

## 2014-10-15 MED ORDER — TICAGRELOR 90 MG PO TABS
ORAL_TABLET | ORAL | Status: AC
  Filled 2014-10-15: qty 2

## 2014-10-15 MED ORDER — IOHEXOL 350 MG/ML SOLN
INTRAVENOUS | Status: DC | PRN
  Administered 2014-10-15: 170 mL via INTRA_ARTERIAL

## 2014-10-15 MED ORDER — NITROGLYCERIN 1 MG/10 ML FOR IR/CATH LAB
INTRA_ARTERIAL | Status: DC | PRN
  Administered 2014-10-15: 100 ug via INTRACORONARY
  Administered 2014-10-15: 150 ug via INTRACORONARY

## 2014-10-15 MED ORDER — SODIUM CHLORIDE 0.9 % IV SOLN
250.0000 mL | INTRAVENOUS | Status: DC | PRN
  Administered 2014-10-15: 250 mL via INTRAVENOUS

## 2014-10-15 MED ORDER — SODIUM CHLORIDE 0.9 % IJ SOLN: 3.0000 mL | INTRAMUSCULAR | Status: DC | PRN

## 2014-10-15 MED ORDER — POTASSIUM CHLORIDE 10 MEQ/100ML IV SOLN
10.0000 meq | INTRAVENOUS | Status: AC
  Administered 2014-10-15 (×3): 10 meq via INTRAVENOUS
  Filled 2014-10-15 (×2): qty 100

## 2014-10-15 MED ORDER — ASPIRIN 81 MG PO CHEW
324.0000 mg | CHEWABLE_TABLET | Freq: Once | ORAL | Status: AC
Start: 2014-10-15 — End: 2014-10-15
  Administered 2014-10-15: 324 mg via ORAL
  Filled 2014-10-15: qty 4

## 2014-10-15 MED ORDER — SODIUM CHLORIDE 0.9 % IV SOLN: INTRAVENOUS | Status: AC

## 2014-10-15 MED ORDER — PANTOPRAZOLE SODIUM 40 MG PO TBEC
40.0000 mg | DELAYED_RELEASE_TABLET | Freq: Every day | ORAL | Status: DC
  Administered 2014-10-15 – 2014-10-19 (×5): 40 mg via ORAL
  Filled 2014-10-15 (×5): qty 1

## 2014-10-15 MED ORDER — BIVALIRUDIN 250 MG IV SOLR
INTRAVENOUS | Status: AC
  Filled 2014-10-15: qty 250

## 2014-10-15 MED ORDER — TICAGRELOR 90 MG PO TABS
90.0000 mg | ORAL_TABLET | Freq: Two times a day (BID) | ORAL | Status: DC
  Administered 2014-10-15 – 2014-10-19 (×9): 90 mg via ORAL
  Filled 2014-10-15 (×14): qty 1

## 2014-10-15 MED ORDER — BIVALIRUDIN BOLUS VIA INFUSION - CUPID
INTRAVENOUS | Status: DC | PRN
  Administered 2014-10-15: 90.15 mg via INTRAVENOUS

## 2014-10-15 MED ORDER — NITROGLYCERIN 1 MG/10 ML FOR IR/CATH LAB
INTRA_ARTERIAL | Status: AC
  Filled 2014-10-15: qty 10

## 2014-10-15 MED ORDER — SODIUM CHLORIDE 0.9 % IJ SOLN
3.0000 mL | Freq: Two times a day (BID) | INTRAMUSCULAR | Status: DC
  Administered 2014-10-15 – 2014-10-19 (×7): 3 mL via INTRAVENOUS

## 2014-10-15 MED ORDER — ASPIRIN 300 MG RE SUPP: 300.0000 mg | RECTAL | Status: DC

## 2014-10-15 MED ORDER — DOPAMINE-DEXTROSE 3.2-5 MG/ML-% IV SOLN
5.0000 ug/kg/min | INTRAVENOUS | Status: DC
  Administered 2014-10-15: 5 ug/kg/min via INTRAVENOUS

## 2014-10-15 MED ORDER — NITROGLYCERIN 1 MG/10 ML FOR IR/CATH LAB
INTRA_ARTERIAL | Status: DC | PRN
  Administered 2014-10-15: 25 mL

## 2014-10-15 MED ORDER — HEPARIN SODIUM (PORCINE) 5000 UNIT/ML IJ SOLN
4000.0000 [IU] | INTRAMUSCULAR | Status: AC
  Administered 2014-10-15: 4000 [IU] via INTRAVENOUS
  Filled 2014-10-15: qty 1

## 2014-10-15 MED ORDER — ASPIRIN 81 MG PO CHEW
81.0000 mg | CHEWABLE_TABLET | Freq: Every day | ORAL | Status: DC
Start: 2014-10-15 — End: 2014-10-19
  Administered 2014-10-15 – 2014-10-19 (×5): 81 mg via ORAL
  Filled 2014-10-15 (×5): qty 1

## 2014-10-15 MED ORDER — INFLUENZA VAC SPLIT QUAD 0.5 ML IM SUSY
0.5000 mL | PREFILLED_SYRINGE | INTRAMUSCULAR | Status: AC
Start: 2014-10-16 — End: 2014-10-17
  Administered 2014-10-17: 0.5 mL via INTRAMUSCULAR
  Filled 2014-10-15 (×2): qty 0.5

## 2014-10-15 MED ORDER — TICAGRELOR 90 MG PO TABS
ORAL_TABLET | ORAL | Status: DC | PRN
  Administered 2014-10-15: 180 mg via ORAL
  Administered 2014-10-15: 90 mg via ORAL

## 2014-10-15 MED ORDER — MORPHINE SULFATE (PF) 2 MG/ML IV SOLN: 1.0000 mg | INTRAVENOUS | Status: DC | PRN

## 2014-10-15 MED ORDER — ONDANSETRON HCL 4 MG/2ML IJ SOLN
INTRAMUSCULAR | Status: DC | PRN
  Administered 2014-10-15: 4 mg via INTRAVENOUS

## 2014-10-15 MED ORDER — HEPARIN (PORCINE) IN NACL 2-0.9 UNIT/ML-% IJ SOLN
INTRAMUSCULAR | Status: AC
  Filled 2014-10-15: qty 1000

## 2014-10-15 MED ORDER — AMIODARONE HCL IN DEXTROSE 360-4.14 MG/200ML-% IV SOLN
30.0000 mg/h | INTRAVENOUS | Status: DC
  Filled 2014-10-15 (×2): qty 200

## 2014-10-15 MED ORDER — CARVEDILOL 3.125 MG PO TABS
3.1250 mg | ORAL_TABLET | Freq: Two times a day (BID) | ORAL | Status: DC
  Administered 2014-10-15 – 2014-10-16 (×2): 3.125 mg via ORAL
  Filled 2014-10-15 (×5): qty 1

## 2014-10-15 MED ORDER — FENTANYL CITRATE (PF) 100 MCG/2ML IJ SOLN
INTRAMUSCULAR | Status: AC
  Filled 2014-10-15: qty 2

## 2014-10-15 MED ORDER — POTASSIUM CHLORIDE 10 MEQ/100ML IV SOLN
10.0000 meq | INTRAVENOUS | Status: AC
  Filled 2014-10-15: qty 100

## 2014-10-15 MED ORDER — ONDANSETRON HCL 4 MG/2ML IJ SOLN: 4.0000 mg | Freq: Four times a day (QID) | INTRAMUSCULAR | Status: DC | PRN

## 2014-10-15 MED ORDER — ASPIRIN EC 81 MG PO TBEC: 81.0000 mg | DELAYED_RELEASE_TABLET | Freq: Every day | ORAL | Status: DC

## 2014-10-15 MED ORDER — ASPIRIN 81 MG PO CHEW: 324.0000 mg | CHEWABLE_TABLET | ORAL | Status: DC

## 2014-10-15 MED ORDER — ONDANSETRON HCL 4 MG/2ML IJ SOLN
INTRAMUSCULAR | Status: AC
  Filled 2014-10-15: qty 2

## 2014-10-15 MED ORDER — TICAGRELOR 90 MG PO TABS: 90.0000 mg | ORAL_TABLET | Freq: Two times a day (BID) | ORAL | Status: DC

## 2014-10-15 MED ORDER — TICAGRELOR 90 MG PO TABS
ORAL_TABLET | ORAL | Status: AC
  Filled 2014-10-15: qty 1

## 2014-10-15 MED ORDER — AMIODARONE HCL IN DEXTROSE 360-4.14 MG/200ML-% IV SOLN
60.0000 mg/h | INTRAVENOUS | Status: DC
  Administered 2014-10-15: 60 mg/h via INTRAVENOUS
  Filled 2014-10-15: qty 200

## 2014-10-15 MED ORDER — ONDANSETRON HCL 4 MG/2ML IJ SOLN
4.0000 mg | Freq: Four times a day (QID) | INTRAMUSCULAR | Status: DC | PRN
  Administered 2014-10-15: 4 mg via INTRAVENOUS
  Filled 2014-10-15: qty 2

## 2014-10-15 MED ORDER — FAMOTIDINE IN NACL 20-0.9 MG/50ML-% IV SOLN
INTRAVENOUS | Status: DC | PRN
  Administered 2014-10-15: 20 mg via INTRAVENOUS

## 2014-10-15 MED ORDER — ATROPINE SULFATE 0.1 MG/ML IJ SOLN
INTRAMUSCULAR | Status: AC
  Filled 2014-10-15: qty 10

## 2014-10-15 MED ORDER — TICAGRELOR 90 MG PO TABS
90.0000 mg | ORAL_TABLET | Freq: Two times a day (BID) | ORAL | Status: DC
  Filled 2014-10-15 (×2): qty 1

## 2014-10-15 SURGICAL SUPPLY — 25 items
BALLN EMERGE MR 2.5X15 (BALLOONS) ×2
BALLN EMERGE MR 3.0X15 (BALLOONS) ×2
BALLN EMERGE MR 3.0X20 (BALLOONS) ×2
BALLN MINITREK RX 2.0X15 (BALLOONS) ×2
BALLN ~~LOC~~ EMERGE MR 3.0X12 (BALLOONS) ×2
BALLN ~~LOC~~ TREK RX 3.75X15 (BALLOONS) ×2 IMPLANT
BALLOON EMERGE MR 2.5X15 (BALLOONS) ×1 IMPLANT
BALLOON EMERGE MR 3.0X15 (BALLOONS) ×1 IMPLANT
BALLOON EMERGE MR 3.0X20 (BALLOONS) ×1 IMPLANT
BALLOON MINITREK RX 2.0X15 (BALLOONS) ×1 IMPLANT
BALLOON ~~LOC~~ EMERGE MR 3.0X12 (BALLOONS) ×1 IMPLANT
CATH INFINITI 5FR MULTPACK ANG (CATHETERS) ×2 IMPLANT
GUIDE CATH RUNWAY 6FR VL4 (CATHETERS) ×2 IMPLANT
KIT ENCORE 26 ADVANTAGE (KITS) ×4 IMPLANT
KIT HEART LEFT (KITS) ×2 IMPLANT
PACK CARDIAC CATHETERIZATION (CUSTOM PROCEDURE TRAY) ×2 IMPLANT
SHEATH PINNACLE 6F 10CM (SHEATH) ×2 IMPLANT
STENT XIENCE ALPINE RX 3.0X15 (Permanent Stent) ×2 IMPLANT
STENT XIENCE ALPINE RX 3.5X28 (Permanent Stent) ×2 IMPLANT
SYR MEDRAD MARK V 150ML (SYRINGE) ×2 IMPLANT
TRANSDUCER W/STOPCOCK (MISCELLANEOUS) ×2 IMPLANT
TUBING CIL FLEX 10 FLL-RA (TUBING) ×2 IMPLANT
WIRE EMERALD 3MM-J .035X150CM (WIRE) ×2 IMPLANT
WIRE PT2 MS 185 (WIRE) ×2 IMPLANT
WIRE RUNTHROUGH .014X180CM (WIRE) ×2 IMPLANT

## 2014-10-15 NOTE — Progress Notes (Signed)
Pt ACT drawn and ran per PCI orders 2 hrs after angiomax off. Result of 134 secs noted. Consulted with 2 Heart charge nurse regarding Pt's previous hemodynamic situation ie: requiring fluid bolus and restarting dopamine. Romy, RN advised to delay pulling femoral sheath until Pt more hemodynamically stable. Will advise morning Charge RN that the Pt still needs sheath pulled.

## 2014-10-15 NOTE — Progress Notes (Signed)
CRITICAL VALUE ALERT  Critical value received:  Troponin I >65.00  Date of notification: t  Time of notification:  4847  Critical value read back: Yes.    Nurse who received alert:  Hettie Holstein  MD notified (1st page): Dr Terrence Dupont  Time of first page:  0720  MD notified (2nd page):  Time of second page:  Responding MD:  DR Terrence Dupont  Time MD responded:  [phone]

## 2014-10-15 NOTE — Progress Notes (Signed)
Utilization Review Completed.Tarissa Kerin T9/06/2014  

## 2014-10-15 NOTE — H&P (Signed)
Carl [name] is an 74 y.o. male.   Chief Complaint: Chest pain HPI: Patient is 74 year old male with past medical history significant for hypertension hyperlipidemia and degenerative joint disease came to the ER by EMS escorts time he was called. Patient complained of retrosternal chest pain which started yesterday afternoon around 2 PM did not seek medical attention initially as he thought it was due to acid reflux but as pain persisted radiating to the right arm associated with diaphoresis and called EMS EKG done in the field showed normal sinus rhythm with ST elevation in anterolateral leads and rest focal changes in inferior leads code STEMI was called.  Past Medical History  Diagnosis Date  . Hypertension   . Hyperlipidemia   . Arthritis   . Quadriceps tendon rupture     LEFT    Past Surgical History  Procedure Laterality Date  . Throat surgery  2010  . Quadriceps tendon repair Left 02/20/2014    Procedure: LEFT REPAIR QUADRICEP TENDON;  Surgeon: Mauri Pole, MD;  Location: [facility];  Service: Orthopedics;  Laterality: Left;    No family history on file. Social History:  reports that he has never smoked. He does not have any smokeless tobacco history on file. He reports that he drinks alcohol. He reports that he does not use illicit drugs.  Allergies: No Known Allergies  Medications Prior to Admission  Medication Sig Dispense Refill  . amLODipine (NORVASC) 5 MG tablet Take 5 mg by mouth at bedtime.     . docusate sodium 100 MG CAPS Take 100 mg by mouth 2 (two) times daily. (Patient not taking: Reported on 04/09/2014) 10 capsule 0  . ferrous sulfate 325 (65 FE) MG tablet Take 1 tablet (325 mg total) by mouth 3 (three) times daily after meals. (Patient not taking: Reported on 04/09/2014)  3  . HYDROcodone-acetaminophen (NORCO) 7.5-325 MG per tablet Take 1-2 tablets by mouth every 4 (four) hours as needed for moderate pain. (Patient not taking: Reported on 04/09/2014) 100 tablet  0  . Multiple Vitamin (MULTIVITAMIN WITH MINERALS) TABS tablet Take 1 tablet by mouth daily.    Vladimir Faster Glycol-Propyl Glycol (SYSTANE OP) Apply 1 drop to eye 3 (three) times daily as needed (Dry eyes).    . polyethylene glycol (MIRALAX / GLYCOLAX) packet Take 17 g by mouth 2 (two) times daily. (Patient not taking: Reported on 04/09/2014) 14 each 0  . STUDY MEDICATION Take 1 tablet by mouth daily. Through [place] is either getting vitamin d or placebo. Pt unsure of strength of medication    . tiZANidine (ZANAFLEX) 4 MG tablet Take 1 tablet (4 mg total) by mouth every 6 (six) hours as needed for muscle spasms. (Patient not taking: Reported on 04/09/2014) 30 tablet 0    Results for orders placed or performed during the hospital encounter of 10/15/14 (from the past 48 hour(s))  CBG monitoring, ED     Status: None   Collection Time: 10/15/14 12:29 AM  Result Value Ref Range   Glucose-Capillary 98 65 - 99 mg/dL  APTT     Status: None   Collection Time: 10/15/14 12:33 AM  Result Value Ref Range   aPTT 29 24 - 37 seconds  CBC     Status: None   Collection Time: 10/15/14 12:33 AM  Result Value Ref Range   WBC 9.4 4.0 - 10.5 K/uL   RBC 4.86 4.22 - 5.81 MIL/uL   Hemoglobin 15.6 13.0 - 17.0 g/dL  HCT 46.4 39.0 - 52.0 %   MCV 95.5 78.0 - 100.0 fL   MCH 32.1 26.0 - 34.0 pg   MCHC 33.6 30.0 - 36.0 g/dL   RDW 12.9 11.5 - 15.5 %   Platelets 215 150 - 400 K/uL  Comprehensive metabolic panel     Status: Abnormal   Collection Time: 10/15/14 12:33 AM  Result Value Ref Range   Sodium 137 135 - 145 mmol/L   Potassium 3.1 (L) 3.5 - 5.1 mmol/L   Chloride 106 101 - 111 mmol/L   CO2 20 (L) 22 - 32 mmol/L   Glucose, Bld 133 (H) 65 - 99 mg/dL   BUN 11 6 - 20 mg/dL   Creatinine, Ser 1.13 0.61 - 1.24 mg/dL   Calcium 8.8 (L) 8.9 - 10.3 mg/dL   Total Protein 7.1 6.5 - 8.1 g/dL   Albumin 3.8 3.5 - 5.0 g/dL   AST 59 (H) 15 - 41 U/L   ALT 43 17 - 63 U/L   Alkaline Phosphatase 50 38 - 126 U/L    Total Bilirubin 1.1 0.3 - 1.2 mg/dL   GFR calc non Af Amer >60 >60 mL/min   GFR calc Af Amer >60 >60 mL/min    Comment: (NOTE) The eGFR has been calculated using the CKD EPI equation. This calculation has not been validated in all clinical situations. eGFR's persistently <60 mL/min signify possible Chronic Kidney Disease.    Anion gap 11 5 - 15  Protime-INR     Status: None   Collection Time: 10/15/14 12:33 AM  Result Value Ref Range   Prothrombin Time 14.5 11.6 - 15.2 seconds   INR 1.12 0.00 - 1.49  POCT i-Stat troponin I     Status: Abnormal   Collection Time: 10/15/14 12:45 AM  Result Value Ref Range   Troponin i, poc 1.12 (HH) 0.00 - 0.08 ng/mL   Comment NOTIFIED PHYSICIAN    Comment 3            Comment: Due to the release kinetics of cTnI, a negative result within the first hours of the onset of symptoms does not rule out myocardial infarction with certainty. If myocardial infarction is still suspected, repeat the test at appropriate intervals.    Dg Chest Port 1 View  10/15/2014   CLINICAL DATA:  Code STEMI  EXAM: PORTABLE CHEST - 1 VIEW  COMPARISON:  02/16/2014  FINDINGS: The heart size and mediastinal contours are within normal limits. Both lungs are clear. The visualized skeletal structures are unremarkable.  IMPRESSION: No active disease.   Electronically Signed   By: Lucienne Capers M.D.   On: 10/15/2014 01:53    Review of Systems  Constitutional: Positive for diaphoresis. Negative for fever and chills.  Eyes: Negative for double vision.  Respiratory: Negative for cough, hemoptysis, sputum production and shortness of breath.   Cardiovascular: Positive for chest pain. Negative for palpitations, orthopnea, claudication and leg swelling.  Gastrointestinal: Negative for vomiting.  Genitourinary: Negative for dysuria.  Neurological: Positive for dizziness. Negative for headaches.    Blood pressure 121/76, pulse 91, temperature 97.7 F (36.5 C), temperature source  Oral, resp. rate 13, height $RemoveBe'6\' 1"'rppthJUIj$  (1.854 m), weight 120.203 kg (265 lb), SpO2 99 %. Physical Exam  Constitutional: He is oriented to person, place, and time.  HENT:  Head: Normocephalic and atraumatic.  Eyes: Conjunctivae are normal. Pupils are equal, round, and reactive to light. Left eye exhibits no discharge. No scleral icterus.  Neck: Normal range of  motion. Neck supple. No JVD present. No tracheal deviation present. No thyromegaly present.  Cardiovascular: Normal rate and regular rhythm.   Murmur (Soft systolic murmur and S4 gallop noted) heard. Respiratory: Effort normal and breath sounds normal. No respiratory distress. He has no wheezes. He has no rales.  GI: Soft. Bowel sounds are normal. He exhibits no distension. There is no tenderness. There is no rebound.  Musculoskeletal: He exhibits no edema or tenderness.  Neurological: He is alert and oriented to person, place, and time.     Assessment/Plan Acute anterolateral wall myocardial infarction Hypertension Hyperlipidemia Elevated blood sugar rule out diabetes mellitus Degenerative joint disease Morbid obesity Plan Discussed with patient and his wife or the phone regarding emergency left cath possible PTCA stenting its risk and benefits i.e. death MI stroke need for emergency CABG local vascular complications etc. and consented for PCI.  Charolette Forward 10/15/2014, 2:18 AM

## 2014-10-15 NOTE — Progress Notes (Signed)
Dr. Terrence Dupont in to see patient.  Made aware of patient's EKG, nausea/vomiting.  Also aware of BP and increase of Dopamine to 57mcg.  Order to hold Coreg for BP less than 100 and to give 250cc bolus NS.  Also advised to take Sheath out .  No other changes noted at this time.

## 2014-10-15 NOTE — ED Notes (Signed)
EMS VS: 139/114 80 100% RR20

## 2014-10-15 NOTE — ED Provider Notes (Signed)
This chart was scribed for Carl Park, DO by Forrestine Him, ED Scribe. This patient was seen in room [facility] and the patient's care was started 12:23 AM.   TIME SEEN: 12:23 AM  CHIEF COMPLAINT:  No chief complaint on file.    HPI:  HPI Comments: ADON GEHLHAUSEN brought in by EMS is a 74 y.o. male with a PMHx of HTN and hyperlipidemia who presents to the Emergency Department here for code STEMI this morning.  EMS was called out for sudden onset, constant chest pain onset 11:45 yesterday evening. Associated shortness of breath also reported. Baby ASA taken prior to EMS arrival. 4 mg of Morphine and 4 mg of Zofran also given en route to department. No recent fever or chills. Denies any previous history of MI. No previous history of heart disease. Carl [name] is not currently followed by cardiology. No known allergies to medications.  PCP:  Melinda Crutch, MD    ROS: See HPI Constitutional: no fever Eyes: no drainage  ENT: no runny nose   Cardiovascular:  Positive chest pain  Resp: Positive SOB  GI: no vomiting GU: no dysuria Integumentary: no rash  Allergy: no hives  Musculoskeletal: no leg swelling  Neurological: no slurred speech ROS otherwise negative  PAST MEDICAL HISTORY/PAST SURGICAL HISTORY:  Past Medical History  Diagnosis Date  . Hypertension   . Hyperlipidemia   . Arthritis   . Quadriceps tendon rupture     LEFT    MEDICATIONS:  Prior to Admission medications   Medication Sig Start Date End Date Taking? Authorizing Provider  amLODipine (NORVASC) 5 MG tablet Take 5 mg by mouth at bedtime.  01/25/14   Historical Provider, MD  docusate sodium 100 MG CAPS Take 100 mg by mouth 2 (two) times daily. Patient not taking: Reported on 04/09/2014 02/20/14   Danae Orleans, PA-C  ferrous sulfate 325 (65 FE) MG tablet Take 1 tablet (325 mg total) by mouth 3 (three) times daily after meals. Patient not taking: Reported on 04/09/2014 02/21/14   Danae Orleans, PA-C   HYDROcodone-acetaminophen (NORCO) 7.5-325 MG per tablet Take 1-2 tablets by mouth every 4 (four) hours as needed for moderate pain. Patient not taking: Reported on 04/09/2014 02/20/14   Danae Orleans, PA-C  Multiple Vitamin (MULTIVITAMIN WITH MINERALS) TABS tablet Take 1 tablet by mouth daily.    Historical Provider, MD  Polyethyl Glycol-Propyl Glycol (SYSTANE OP) Apply 1 drop to eye 3 (three) times daily as needed (Dry eyes).    Historical Provider, MD  polyethylene glycol (MIRALAX / GLYCOLAX) packet Take 17 g by mouth 2 (two) times daily. Patient not taking: Reported on 04/09/2014 02/20/14   Danae Orleans, PA-C  STUDY MEDICATION Take 1 tablet by mouth daily. Through [place] is either getting vitamin d or placebo. Pt unsure of strength of medication    Historical Provider, MD  tiZANidine (ZANAFLEX) 4 MG tablet Take 1 tablet (4 mg total) by mouth every 6 (six) hours as needed for muscle spasms. Patient not taking: Reported on 04/09/2014 02/20/14   Danae Orleans, PA-C    ALLERGIES:  No Known Allergies  SOCIAL HISTORY:  Social History  Substance Use Topics  . Smoking status: Never Smoker   . Smokeless tobacco: Not on file  . Alcohol Use: Yes     Comment: OCCASIONAL    FAMILY HISTORY: No family history on file.  EXAM: Temperature 97.7, pulse 84, respiratory 16, blood pressure 120/74, sat 100% on room air CONSTITUTIONAL: Alert and oriented and  responds appropriately to questions. Appears uncomfortable, well-nourished; he is diaphoretic  HEAD: Normocephalic EYES: Conjunctivae clear, PERRL ENT: normal nose; no rhinorrhea; moist mucous membranes; pharynx without lesions noted NECK: Supple, no meningismus, no LAD  CARD: RRR; S1 and S2 appreciated; no murmurs, no clicks, no rubs, no gallops RESP: Normal chest excursion without splinting or tachypnea; breath sounds clear and equal bilaterally; no wheezes, no rhonchi, no rales, no hypoxia or respiratory distress, speaking full  sentences ABD/GI: Normal bowel sounds; non-distended; soft, non-tender, no rebound, no guarding, no peritoneal signs BACK:  The back appears normal and is non-tender to palpation, there is no CVA tenderness EXT: Normal ROM in all joints; non-tender to palpation; no edema; normal capillary refill; no cyanosis, no calf tenderness or swelling    SKIN: Normal color for age and race; Skin is cool and clammy NEURO: Moves all extremities equally, sensation to light touch intact diffusely, cranial nerves II through XII intact; oriented x 3 PSYCH: The patient's mood and manner are appropriate. Grooming and personal hygiene are appropriate.  MEDICAL DECISION MAKING: Patient here with STEMI.  Pain started around 11:45 PM. We'll give aspirin, heparin, fentanyl.  Code STEMI activated prior to patient's arrival upon seeing EMS EKG.  Will obtain labs, chest x-ray. He is hemodynamically stable.  Harwani at bedside to take patient to cath lab.      Date: 10/15/2014  00:26   Rate: 87  Rhythm: normal sinus rhythm  QRS Axis: normal  Intervals: normal  ST/T Wave abnormalities: ST elevation in lead 1, aVL, V2 through V5, ST depression in inferior leads  Conduction Disutrbances: none  Narrative Interpretation: Acute ST elevation myocardial infarction    I personally performed the services described in this documentation, which was scribed in my presence. The recorded information has been reviewed and is accurate.    [place], DO 10/15/14 [phone]

## 2014-10-15 NOTE — Progress Notes (Signed)
EKG CRITICAL VALUE     12 lead EKG performed.  Critical value noted. Beckie Salts, RN notified.   Yehuda Mao, CCT 10/15/2014 8:20 AM

## 2014-10-15 NOTE — Progress Notes (Signed)
Subjective:  Patient denies any chest pain or shortness of breath. Restarted on low-dose dopamine due to hypotension.  Objective:  Vital Signs in the last 24 hours: Temp:  [97.7 F (36.5 C)-98.8 F (37.1 C)] 98.8 F (37.1 C) (09/05 0800) Pulse Rate:  [0-175] 71 (09/05 0921) Resp:  [0-57] 13 (09/05 0921) BP: (58-144)/(42-99) 96/54 mmHg (09/05 0921) SpO2:  [0 %-100 %] 97 % (09/05 0921) Arterial Line BP: (72-109)/(46-60) 109/60 mmHg (09/05 0900) Weight:  [120 kg (264 lb 8.8 oz)-120.203 kg (265 lb)] 120 kg (264 lb 8.8 oz) (09/05 0400)  Intake/Output from previous day: 09/04 0701 - 09/05 0700 In: 918.5 [P.O.:240; I.V.:378.5; IV Piggyback:300] Out: 300 [Urine:300] Intake/Output from this shift: Total I/O In: 6.8 [I.V.:6.8] Out: 250 [Emesis/NG output:250]  Physical Exam: Neck: no adenopathy, no carotid bruit, no JVD and supple, symmetrical, trachea midline Lungs: Clear to auscultation anterolaterally Heart: regular rate and rhythm, S1, S2 normal and Soft systolic murmur noted Abdomen: soft, non-tender; bowel sounds normal; no masses,  no organomegaly Extremities: extremities normal, atraumatic, no cyanosis or edema and Right groin sheath site okay  Lab Results:  Recent Labs  10/15/14 0033 10/15/14 0110 10/15/14 0500  WBC 9.4  --  13.8*  HGB 15.6 15.6 13.4  PLT 215  --  213    Recent Labs  10/15/14 0033 10/15/14 0110 10/15/14 0500  NA 137 141 136  K 3.1* 3.0* 4.3  CL 106 105 107  CO2 20*  --  19*  GLUCOSE 133* 157* 155*  BUN 11 14 13   CREATININE 1.13 0.90 1.18    Recent Labs  10/15/14 0500  TROPONINI >65.00*   Hepatic Function Panel  Recent Labs  10/15/14 0500  PROT 6.3*  ALBUMIN 3.1*  AST 531*  ALT 110*  ALKPHOS 43  BILITOT 1.2    Recent Labs  10/15/14 0503  CHOL 142   No results for input(s): PROTIME in the last 72 hours.  Imaging: Imaging results have been reviewed and Dg Chest Port 1 View  10/15/2014   CLINICAL DATA:  Code STEMI  EXAM:  PORTABLE CHEST - 1 VIEW  COMPARISON:  02/16/2014  FINDINGS: The heart size and mediastinal contours are within normal limits. Both lungs are clear. The visualized skeletal structures are unremarkable.  IMPRESSION: No active disease.   Electronically Signed   By: Lucienne Capers M.D.   On: 10/15/2014 01:53    Cardiac Studies:  Assessment/Plan:  Acute anterolateral wall myocardial infarction status post PCI to 100% occluded LAD and diagonal 2 with excellent angiographic results Hypotensive shock Hypertension Hyperlipidemia Elevated blood sugar rule out diabetes mellitus Degenerative joint disease Morbid obesity Plan As per orders Wean off dopamine Will start ACE inhibitor once blood pressure improves Check labs in a.m.  LOS: 0 days    Charolette Forward 10/15/2014, 10:04 AM

## 2014-10-15 NOTE — ED Notes (Addendum)
EMS gave 4 mg Morphine and 4 mg Zofran.

## 2014-10-15 NOTE — Progress Notes (Signed)
Order to pull Sheath acknowledged, Pt's sbp was in 80's as told by primary RN, and ACT 130s. Informed primary RN to titrate dopamine up to 5 and call when SBP was 100 or better.   Pt informed of the need to pull sheath. Sheath removal instructions given to pt and he verbalized understanding.   Manual pressure held to right groin for 16 mins. Vitals signs stable throughout procedure. Palpable pulses throughout. No signs of hematoma. Site soft and level 0.  Pressure dressing applied to sight after primary RN came to bedside and assessed groin site.   Post sheath pull instructions given to pt and he verbalized understanding. Endorsed to primary RN for continued monitoring.

## 2014-10-15 NOTE — ED Notes (Signed)
Pt arrives via EMS from home. Pt c/o chest pain all day. Pt was on heart monitor at home when EMS arrived. Pt was diaphoretic upon arrival.

## 2014-10-15 NOTE — Progress Notes (Signed)
Pt's monitor alarming, showing a marked decrease of SBP to 70-80. Correlated with BP cuff. Dr Terrence Dupont contacted and orders received to restart Dopamine drip and administer 250cc N.S. Bolus. Will continue to monitor and assess.

## 2014-10-16 ENCOUNTER — Encounter (HOSPITAL_COMMUNITY): Payer: Self-pay | Admitting: Cardiology

## 2014-10-16 LAB — CBC
HCT: 40.7 % (ref 39.0–52.0)
Hemoglobin: 13.2 g/dL (ref 13.0–17.0)
MCH: 31 pg (ref 26.0–34.0)
MCHC: 32.4 g/dL (ref 30.0–36.0)
MCV: 95.5 fL (ref 78.0–100.0)
Platelets: 188 10*3/uL (ref 150–400)
RBC: 4.26 MIL/uL (ref 4.22–5.81)
RDW: 13.1 % (ref 11.5–15.5)
WBC: 14.4 10*3/uL — ABNORMAL HIGH (ref 4.0–10.5)

## 2014-10-16 LAB — BASIC METABOLIC PANEL
Anion gap: 7 (ref 5–15)
BUN: 12 mg/dL (ref 6–20)
CO2: 25 mmol/L (ref 22–32)
Calcium: 8.3 mg/dL — ABNORMAL LOW (ref 8.9–10.3)
Chloride: 107 mmol/L (ref 101–111)
Creatinine, Ser: 1.17 mg/dL (ref 0.61–1.24)
GFR calc Af Amer: >60 mL/min (ref >60)
GFR calc non Af Amer: 59 mL/min — ABNORMAL LOW (ref >60)
Glucose, Bld: 114 mg/dL — ABNORMAL HIGH (ref 65–99)
Potassium: 4.1 mmol/L (ref 3.5–5.1)
Sodium: 139 mmol/L (ref 135–145)

## 2014-10-16 LAB — HEMOGLOBIN A1C
Hgb A1c MFr Bld: 5.3 % (ref 4.8–5.6)
Hgb A1c MFr Bld: 5.3 % (ref 4.8–5.6)
Mean Plasma Glucose: 105 mg/dL
Mean Plasma Glucose: 105 mg/dL

## 2014-10-16 LAB — GLUCOSE, CAPILLARY
Glucose-Capillary: 126 mg/dL — ABNORMAL HIGH (ref 65–99)
Glucose-Capillary: 117 mg/dL — ABNORMAL HIGH (ref 65–99)
Glucose-Capillary: 126 mg/dL — ABNORMAL HIGH (ref 65–99)
Glucose-Capillary: 111 mg/dL — ABNORMAL HIGH (ref 65–99)
Glucose-Capillary: 103 mg/dL — ABNORMAL HIGH (ref 65–99)
Glucose-Capillary: 113 mg/dL — ABNORMAL HIGH (ref 65–99)

## 2014-10-16 LAB — TROPONIN I: Troponin I: >65 ng/mL (ref <0.031)

## 2014-10-16 MED ORDER — RAMIPRIL 1.25 MG PO CAPS
1.2500 mg | ORAL_CAPSULE | Freq: Every day | ORAL | Status: DC
Start: 2014-10-16 — End: 2014-10-19
  Administered 2014-10-17 – 2014-10-19 (×3): 1.25 mg via ORAL
  Filled 2014-10-16 (×5): qty 1

## 2014-10-16 MED ORDER — CARVEDILOL 6.25 MG PO TABS
6.2500 mg | ORAL_TABLET | Freq: Two times a day (BID) | ORAL | Status: DC
  Administered 2014-10-16 – 2014-10-19 (×5): 6.25 mg via ORAL
  Filled 2014-10-16 (×11): qty 1

## 2014-10-16 NOTE — Progress Notes (Signed)
CARDIAC REHAB PHASE I   PRE:  Rate/Rhythm: 99 SR    BP: sitting 97/52    SaO2: 93 RA  MODE:  Ambulation: 300 ft   POST:  Rate/Rhythm: 110 ST, 100 ST    BP: sitting 110/76     SaO2: 95 RA  Pt weak upon standing, wobbly on legs while urinating. Used gait belt and allowed pt to push IV pole. Steadier walking. Suggest PT for strengthening and suggestions as pt lives alone and was taking care of all of his ADLs and going to gym. Pts VSS with walking. Return to recliner. Discussed MI, stent, Brilinta with pt. Voiced understanding but would benefit from reiteration. Will f/u. [phone]   Josephina Shih Godley CES, ACSM 10/16/2014 10:54 AM

## 2014-10-16 NOTE — Progress Notes (Signed)
Subjective:  Denies any chest pain states feeling better. Off dopamine.  Objective:  Vital Signs in the last 24 hours: Temp:  [98.6 F (37 C)-100.2 F (37.9 C)] 99.7 F (37.6 C) (09/06 0800) Pulse Rate:  [77-96] 96 (09/06 0900) Resp:  [10-23] 23 (09/06 0900) BP: (63-154)/(41-137) 119/85 mmHg (09/06 0900) SpO2:  [92 %-100 %] 93 % (09/06 0900)  Intake/Output from previous day: 09/05 0701 - 09/06 0700 In: 1746.8 [P.O.:600; I.V.:1146.8] Out: 2075 [Urine:1825; Emesis/NG output:250] Intake/Output from this shift: Total I/O In: 30 [I.V.:30] Out: -   Physical Exam: Neck: no adenopathy, no carotid bruit, no JVD and supple, symmetrical, trachea midline Lungs: clear to auscultation bilaterally Heart: regular rate and rhythm, S1, S2 normal and Soft systolic murmur noted Abdomen: soft, non-tender; bowel sounds normal; no masses,  no organomegaly Extremities: extremities normal, atraumatic, no cyanosis or edema and Right groin stable  Lab Results:  Recent Labs  10/15/14 0500 10/16/14 0220  WBC 13.8* 14.4*  HGB 13.4 13.2  PLT 213 188    Recent Labs  10/15/14 0500 10/16/14 0220  NA 136 139  K 4.3 4.1  CL 107 107  CO2 19* 25  GLUCOSE 155* 114*  BUN 13 12  CREATININE 1.18 1.17    Recent Labs  10/15/14 1526 10/16/14 0220  TROPONINI >65.00* >65.00*   Hepatic Function Panel  Recent Labs  10/15/14 0500  PROT 6.3*  ALBUMIN 3.1*  AST 531*  ALT 110*  ALKPHOS 43  BILITOT 1.2    Recent Labs  10/15/14 0503  CHOL 142   No results for input(s): PROTIME in the last 72 hours.  Imaging: Imaging results have been reviewed and Dg Chest Port 1 View  10/15/2014   CLINICAL DATA:  Code STEMI  EXAM: PORTABLE CHEST - 1 VIEW  COMPARISON:  02/16/2014  FINDINGS: The heart size and mediastinal contours are within normal limits. Both lungs are clear. The visualized skeletal structures are unremarkable.  IMPRESSION: No active disease.   Electronically Signed   By: Lucienne Capers  M.D.   On: 10/15/2014 01:53    Cardiac Studies:  Assessment/Plan:  Acute anterolateral wall myocardial infarction status post PCI to 100% occluded LAD and diagonal 2 with excellent angiographic results Status post Hypotensive shock Hypertension Hyperlipidemia Elevated blood sugar rule out diabetes mellitus Degenerative joint disease Morbid obesity Plan Increase carvedilol as per orders Start ramipril as per orders OT PT consult Check labs in a.m. Transfer to telemetry  LOS: 1 day    Charolette Forward 10/16/2014, 11:32 AM

## 2014-10-17 DIAGNOSIS — Z79899 Other long term (current) drug therapy: Secondary | ICD-10-CM | POA: Diagnosis not present

## 2014-10-17 DIAGNOSIS — E785 Hyperlipidemia, unspecified: Secondary | ICD-10-CM | POA: Diagnosis not present

## 2014-10-17 DIAGNOSIS — R578 Other shock: Secondary | ICD-10-CM | POA: Diagnosis not present

## 2014-10-17 DIAGNOSIS — I10 Essential (primary) hypertension: Secondary | ICD-10-CM | POA: Diagnosis not present

## 2014-10-17 DIAGNOSIS — M1990 Unspecified osteoarthritis, unspecified site: Secondary | ICD-10-CM | POA: Diagnosis not present

## 2014-10-17 DIAGNOSIS — Z6834 Body mass index (BMI) 34.0-34.9, adult: Secondary | ICD-10-CM | POA: Diagnosis not present

## 2014-10-17 DIAGNOSIS — I2109 ST elevation (STEMI) myocardial infarction involving other coronary artery of anterior wall: Secondary | ICD-10-CM | POA: Diagnosis not present

## 2014-10-17 DIAGNOSIS — Z23 Encounter for immunization: Secondary | ICD-10-CM | POA: Diagnosis not present

## 2014-10-17 LAB — GLUCOSE, CAPILLARY
Glucose-Capillary: 103 mg/dL — ABNORMAL HIGH (ref 65–99)
Glucose-Capillary: 128 mg/dL — ABNORMAL HIGH (ref 65–99)
Glucose-Capillary: 100 mg/dL — ABNORMAL HIGH (ref 65–99)
Glucose-Capillary: 120 mg/dL — ABNORMAL HIGH (ref 65–99)

## 2014-10-17 LAB — COMPREHENSIVE METABOLIC PANEL
ALT: 77 U/L — ABNORMAL HIGH (ref 17–63)
AST: 148 U/L — ABNORMAL HIGH (ref 15–41)
Albumin: 2.9 g/dL — ABNORMAL LOW (ref 3.5–5.0)
Alkaline Phosphatase: 40 U/L (ref 38–126)
Anion gap: 7 (ref 5–15)
BUN: 13 mg/dL (ref 6–20)
CO2: 23 mmol/L (ref 22–32)
Calcium: 8.2 mg/dL — ABNORMAL LOW (ref 8.9–10.3)
Chloride: 107 mmol/L (ref 101–111)
Creatinine, Ser: 1.18 mg/dL (ref 0.61–1.24)
GFR calc Af Amer: >60 mL/min (ref >60)
GFR calc non Af Amer: 58 mL/min — ABNORMAL LOW (ref >60)
Glucose, Bld: 105 mg/dL — ABNORMAL HIGH (ref 65–99)
Potassium: 3.9 mmol/L (ref 3.5–5.1)
Sodium: 137 mmol/L (ref 135–145)
Total Bilirubin: 1.8 mg/dL — ABNORMAL HIGH (ref 0.3–1.2)
Total Protein: 6.2 g/dL — ABNORMAL LOW (ref 6.5–8.1)

## 2014-10-17 MED ORDER — ASPIRIN EC 81 MG PO TBEC
81.0000 mg | DELAYED_RELEASE_TABLET | Freq: Every day | ORAL | Status: DC
Start: 2014-10-17 — End: 2014-10-17

## 2014-10-17 NOTE — Progress Notes (Signed)
Subjective:  Doing fine and denies any chest pain or shortness of breath.  Objective:  Vital Signs in the last 24 hours: Temp:  [98.2 F (36.8 C)-98.6 F (37 C)] 98.2 F (36.8 C) (09/07 0353) Pulse Rate:  [60-91] 86 (09/07 0900) Resp:  [10-24] 18 (09/07 0900) BP: (91-110)/(48-83) 101/57 mmHg (09/07 0900) SpO2:  [92 %-99 %] 97 % (09/07 0900)  Intake/Output from previous day: 09/06 0701 - 09/07 0700 In: 920 [P.O.:840; I.V.:80] Out: 1075 [Urine:1075] Intake/Output from this shift:    Physical Exam: Neck: no adenopathy, no carotid bruit, no JVD and supple, symmetrical, trachea midline Lungs: clear to auscultation bilaterally Heart: regular rate and rhythm, S1, S2 normal and soft systolic murmur noted Abdomen: soft, non-tender; bowel sounds normal; no masses,  no organomegaly Extremities: extremities normal, atraumatic, no cyanosis or edema  Lab Results:  Recent Labs  10/15/14 0500 10/16/14 0220  WBC 13.8* 14.4*  HGB 13.4 13.2  PLT 213 188    Recent Labs  10/16/14 0220 10/17/14 0220  NA 139 137  K 4.1 3.9  CL 107 107  CO2 25 23  GLUCOSE 114* 105*  BUN 12 13  CREATININE 1.17 1.18    Recent Labs  10/15/14 1526 10/16/14 0220  TROPONINI >65.00* >65.00*   Hepatic Function Panel  Recent Labs  10/17/14 0220  PROT 6.2*  ALBUMIN 2.9*  AST 148*  ALT 77*  ALKPHOS 40  BILITOT 1.8*    Recent Labs  10/15/14 0503  CHOL 142   No results for input(s): PROTIME in the last 72 hours.  Imaging: Imaging results have been reviewed and No results found.  Cardiac Studies:  Assessment/Plan:  Acute anterolateral wall myocardial infarction status post PCI to 100% occluded LAD and diagonal 2 with excellent angiographic results Status post Hypotensive shock Hypertension Hyperlipidemia Elevated blood sugar rule out diabetes mellitus Degenerative joint disease Morbid obesity Plan Continue present management. Increase ambulation. Check labs in a.m.  LOS: 2  days    Charolette Forward 10/17/2014, 11:46 AM

## 2014-10-17 NOTE — Care Management Note (Signed)
Case Management Note  Patient Details  Name: RHODES CALVERT MRN: [id number] Date of Birth: [birth year]  Subjective/Objective:    Pt to discharge home on Brilinta.  Copay will be $180/month.  Provided information and card for free 30-day trial, pt indicates he will be able to manage copay.  Pt given written information on [facility], which is mail order pharmacy preferred by [facility].                       Expected Discharge Plan:  [place]  Discharge planning Services  CM Consult  Status of Service:  In process, will continue to follow  Girard Cooter, RN 10/17/2014, 10:08 AM

## 2014-10-17 NOTE — Evaluation (Signed)
Physical Therapy Evaluation Patient Details Name: Carl [name] MRN: [id number] DOB: 10-10-1940 Today's Date: 10/17/2014   History of Present Illness  Patient is 74 year old male with past medical history significant for hypertension hyperlipidemia and degenerative joint disease came to the ER with CP found to have a STEMI; underwent cath for stent to LAD (100% occulded)  Clinical Impression  Pt admitted with above diagnosis. Pt currently with functional limitations due to the deficits listed below (see PT Problem List). Pt denies h/o imbalance, however did display one significant loss of balance during PT evaluation. Will follow to assess post-acute needs.  Pt will benefit from skilled PT to increase their independence and safety with mobility .       Follow Up Recommendations No PT follow up;Supervision - Intermittent    Equipment Recommendations   (TBA)    Recommendations for Other Services       Precautions / Restrictions Precautions Precautions: Fall Restrictions Weight Bearing Restrictions: No      Mobility  Bed Mobility                Transfers Overall transfer level: Needs assistance Equipment used: None Transfers: Sit to/from Stand Sit to Stand: Min guard         General transfer comment: minguard for safety as he adjusted his feet and steadied himself   Ambulation/Gait Ambulation/Gait assistance: Min assist Ambulation Distance (Feet): 500 Feet Assistive device: None Gait Pattern/deviations: Step-through pattern;Decreased stance time - left;Decreased step length - left;Staggering left;Drifts right/left Gait velocity: decr and unable to incr; able to decr further with LOB Gait velocity interpretation: Below normal speed for age/gender General Gait Details: one significant LOB to Left required min assist to correct (when asked to vary his velocity); with head turns drifts to his left when head turned left  Stairs            Wheelchair  Mobility    Modified Rankin (Stroke Patients Only)       Balance Overall balance assessment: Needs assistance Sitting-balance support: No upper extremity supported;Feet supported Sitting balance-Leahy Scale: Good     Standing balance support: No upper extremity supported Standing balance-Leahy Scale: Fair                   Standardized Balance Assessment Standardized Balance Assessment : Berg Balance Test;Dynamic Gait Index (full exams incomplete due to transferring units and walked) [employer] Sit to Stand: Able to stand  independently using hands Standing Unsupported: Able to stand 2 minutes with supervision Sitting with Back Unsupported but Feet Supported on Floor or Stool: Able to sit safely and securely 2 minutes Stand to Sit: Controls descent by using hands Transfers: Able to transfer safely, definite need of hands Standing Unsupported with Eyes Closed: Able to stand 10 seconds with supervision Standing Ubsupported with Feet Together: Able to place feet together independently and stand for 1 minute with supervision From Standing, Reach Forward with Outstretched Arm: Can reach forward >12 cm safely (5") From Standing Position, Turn to Look Behind Over each Shoulder: Looks behind one side only/other side shows less weight shift Dynamic Gait Index Level Surface: Mild Impairment Change in Gait Speed: Severe Impairment Gait with Horizontal Head Turns: Mild Impairment Gait with Vertical Head Turns: Mild Impairment       Pertinent Vitals/Pain Pain Assessment: No/denies pain    Home Living Family/patient expects to be discharged to:: Private residence Living Arrangements: Alone Available Help at Discharge: Family;Available PRN/intermittently Type of Home: House Home Access:  Stairs to enter   [employer] of Steps: 1 Home Layout: One level Home Equipment: Walker - 2 wheels;Crutches      Prior Function Level of Independence: Independent                Hand Dominance   Dominant Hand: Right    Extremity/Trunk Assessment   Upper Extremity Assessment: Defer to OT evaluation           Lower Extremity Assessment: Overall WFL for tasks assessed (LLE slightly weaker than Rt post quad repair 1/16)      Cervical / Trunk Assessment: Normal  Communication   Communication: HOH  Cognition Arousal/Alertness: Awake/alert Behavior During Therapy: WFL for tasks assessed/performed Overall Cognitive Status: Within Functional Limits for tasks assessed                      General Comments      Exercises        Assessment/Plan    PT Assessment Patient needs continued PT services  PT Diagnosis Difficulty walking   PT Problem List Decreased balance;Decreased mobility;Decreased knowledge of use of DME  PT Treatment Interventions DME instruction;Gait training;Stair training;Functional mobility training;Therapeutic activities;Therapeutic exercise;Balance training;Patient/family education   PT Goals (Current goals can be found in the Care Plan section) Acute Rehab PT Goals Patient Stated Goal: to go home PT Goal Formulation: With patient Time For Goal Achievement: 10/24/14 Potential to Achieve Goals: Good    Frequency Min 3X/week   Barriers to discharge Decreased caregiver support      Co-evaluation               End of Session Equipment Utilized During Treatment: Gait belt Activity Tolerance: Patient tolerated treatment well Patient left: in chair;with call bell/phone within reach;with nursing/sitter in room Nurse Communication: Mobility status         Time: 0034-9179 PT Time Calculation (min) (ACUTE ONLY): 17 min   Charges:   PT Evaluation $Initial PT Evaluation Tier I: 1 Procedure     PT G Codes:        Tristyn Demarest 11-16-2014, 11:39 AM Pager [phone]

## 2014-10-17 NOTE — Progress Notes (Signed)
CARDIAC REHAB PHASE I   Pt just walked over with PT (per pt). Will f/u later for amb. Ed completed with good comprehension. Pt able to perform teach back. Interested in [place] and will send referral to [facility]. Discussed low sodium diet. [phone]  Josephina Shih [place] CES, ACSM 10/17/2014 10:35 AM

## 2014-10-17 NOTE — Evaluation (Signed)
Occupational Therapy Evaluation Patient Details Name: Carl [name] MRN: [id number] DOB: November 05, 1940 Today's Date: 10/17/2014    History of Present Illness Patient is 74 year old male with past medical history significant for hypertension hyperlipidemia and degenerative joint disease came to the ER with CP found to have a STEMI.   Clinical Impression   This 74 yo male admitted with above presents to acute OT with mild balance deficits and mild decreased endurance due to new STEMI. He will benefit from one more session of OT.    Follow Up Recommendations  No OT follow up    Equipment Recommendations  None recommended by OT       Precautions / Restrictions Precautions Precautions: Fall Restrictions Weight Bearing Restrictions: No      Mobility Bed Mobility Overal bed mobility: Modified Independent             General bed mobility comments: HOB use of rail  Transfers Overall transfer level: Needs assistance Equipment used: None Transfers: Sit to/from Stand Sit to Stand: Supervision              Balance Overall balance assessment: Needs assistance Sitting-balance support: No upper extremity supported;Feet supported Sitting balance-Leahy Scale: Good     Standing balance support: No upper extremity supported Standing balance-Leahy Scale: Fair                              ADL Overall ADL's : Needs assistance/impaired Eating/Feeding: Independent;Sitting   Grooming: Set up;Supervision/safety;Sitting   Upper Body Bathing: Supervision/ safety;Set up;Sitting;Standing   Lower Body Bathing: Set up;Supervison/ safety;Sit to/from stand   Upper Body Dressing : Set up;Supervision/safety;Sitting;Standing   Lower Body Dressing: Supervision/safety;Set up;Sit to/from stand   Toilet Transfer: Min guard;Ambulation;Comfort height toilet;Grab bars   Toileting- Clothing Manipulation and Hygiene: Supervision/safety;Set up;Sit to/from stand                Vision Additional Comments: No change from baseline          Pertinent Vitals/Pain Pain Assessment: No/denies pain     Hand Dominance Right   Extremity/Trunk Assessment Upper Extremity Assessment Upper Extremity Assessment: Overall WFL for tasks assessed   Lower Extremity Assessment Lower Extremity Assessment: Defer to PT evaluation       Communication Communication Communication: HOH   Cognition Arousal/Alertness: Awake/alert Behavior During Therapy: WFL for tasks assessed/performed Overall Cognitive Status: Within Functional Limits for tasks assessed                                Home Living Family/patient expects to be discharged to:: Private residence Living Arrangements: Alone Available Help at Discharge: Family;Available PRN/intermittently Type of Home: House Home Access: Stairs to enter Entrance Stairs-Number of Steps: 1   Home Layout: One level     Bathroom Shower/Tub: Walk-in shower;Door     Bathroom Accessibility: Yes   Home Equipment: Gilford Rile - 2 wheels;Crutches          Prior Functioning/Environment Level of Independence: Independent             OT Diagnosis: Generalized weakness   OT Problem List: Impaired balance (sitting and/or standing)   OT Treatment/Interventions: Self-care/ADL training;Patient/family education;Balance training;Energy conservation    OT Goals(Current goals can be found in the care plan section) Acute Rehab OT Goals Patient Stated Goal: to go home OT Goal Formulation: With patient Time For Goal Achievement: 10/24/14 Potential to  Achieve Goals: Good  OT Frequency: Min 2X/week   Barriers to D/C:               End of Session Equipment Utilized During Treatment: Gait belt  Activity Tolerance: Patient tolerated treatment well Patient left: in chair;with call bell/phone within reach   Time: 0807-0831 OT Time Calculation (min): 24 min Charges:  OT General Charges $OT Visit: 1  Procedure OT Evaluation $Initial OT Evaluation Tier I: 1 Procedure OT Treatments $Self Care/Home Management : 8-22 mins  Almon Register [phone] 10/17/2014, 9:59 AM

## 2014-10-17 NOTE — Progress Notes (Signed)
Transfer report received from 2S at [facility] and pt arrived to the unit at 0900; VSS, telemetry applied and verified; pt oriented to the unit and room; right groin site level 0, no active bleeding, hematoma or bruising noted; pt sitting up in chair with call light within reach. Will closely monitor. Francis Gaines Breasia Karges RN.

## 2014-10-17 NOTE — Care Management Note (Signed)
Case Management Note  Patient Details  Name: Carl [name] MRN: [id number] Date of Birth: 06-05-1940  Subjective/Objective:    Pt to discharge home on [place].  Copay will be $180/month.  Provided information and card for free 30-day trial, pt indicates he will be able to manage copay.  Pt given written information on [facility], which is mail order pharmacy preferred by [facility].                       Expected Discharge Plan:  [place]  Discharge planning Services  CM Consult  Status of Service:  In process, will continue to follow   10/17/2014 Elenor Quinones, RN, BSN [phone] Pt informed CM that he prefers to go to a pharmacy rather than use a mail order.  CM asked pt about preferred pharmacy, pt stated [facility] near [address] [facility] in [place], [place] contacted pharmacy and was informed that medications is available for pick up post discharge.  CM informed pharmacist of insurance plan and was told by pharmacist that they do accept his insurance, per CM benefits check prior authorization is not needed.  Pt made aware that pharmacy can fill prescription.     Maryclare Labrador, RN 10/17/2014, 10:42 AM

## 2014-10-17 NOTE — Progress Notes (Signed)
CARDIAC REHAB PHASE I   PRE:  Rate/Rhythm: 90 SR  BP:  Sitting: 100/57        SaO2: 99 RA  MODE:  Ambulation: 890 ft   POST:  Rate/Rhythm: 91 sR  BP:  Sitting: 147/87         SaO2: 99 RA  Pt ambulated 890 ft on RA, hand held assist, slow, mildly unsteady gait at baseline, tolerated well. Pt denies CP, dizziness, DOE, declined rest stop.  Pt to recliner after walk, feet elevated, call bell within reach. Will follow.   [phone]  Lenna Sciara, RN, BSN 10/17/2014 2:30 PM

## 2014-10-18 LAB — CBC
HCT: 40.5 % (ref 39.0–52.0)
Hemoglobin: 13.2 g/dL (ref 13.0–17.0)
MCH: 31.1 pg (ref 26.0–34.0)
MCHC: 32.6 g/dL (ref 30.0–36.0)
MCV: 95.3 fL (ref 78.0–100.0)
Platelets: 175 10*3/uL (ref 150–400)
RBC: 4.25 MIL/uL (ref 4.22–5.81)
RDW: 12.8 % (ref 11.5–15.5)
WBC: 9.4 10*3/uL (ref 4.0–10.5)

## 2014-10-18 LAB — GLUCOSE, CAPILLARY
Glucose-Capillary: 108 mg/dL — ABNORMAL HIGH (ref 65–99)
Glucose-Capillary: 96 mg/dL (ref 65–99)
Glucose-Capillary: 120 mg/dL — ABNORMAL HIGH (ref 65–99)
Glucose-Capillary: 94 mg/dL (ref 65–99)

## 2014-10-18 LAB — TROPONIN I: Troponin I: 44.89 ng/mL (ref <0.031)

## 2014-10-18 LAB — BASIC METABOLIC PANEL
Anion gap: 8 (ref 5–15)
BUN: 12 mg/dL (ref 6–20)
CO2: 25 mmol/L (ref 22–32)
Calcium: 8.3 mg/dL — ABNORMAL LOW (ref 8.9–10.3)
Chloride: 104 mmol/L (ref 101–111)
Creatinine, Ser: 1.25 mg/dL — ABNORMAL HIGH (ref 0.61–1.24)
GFR calc Af Amer: >60 mL/min (ref >60)
GFR calc non Af Amer: 54 mL/min — ABNORMAL LOW (ref >60)
Glucose, Bld: 114 mg/dL — ABNORMAL HIGH (ref 65–99)
Potassium: 4 mmol/L (ref 3.5–5.1)
Sodium: 137 mmol/L (ref 135–145)

## 2014-10-18 NOTE — Care Management Important Message (Signed)
Important Message  Patient Details  Name: RITHWIK SCHMIEG MRN: [id number] Date of Birth: 11-19-1940   Medicare Important Message Given:  Yes-second notification given    Nathen May 10/18/2014, 9:57 AMImportant Message  Patient Details  Name: NOEMI ISHMAEL MRN: [id number] Date of Birth: 08-27-1940   Medicare Important Message Given:  Yes-second notification given    Nathen May 10/18/2014, 9:57 AM

## 2014-10-18 NOTE — Progress Notes (Signed)
Subjective:  Doing well and denies any chest pain or shortness of breath. Cardiac enzymes trending down and discussed with patient regarding skilled nursing facility but wishes to go home.  Objective:  Vital Signs in the last 24 hours: Temp:  [98.7 F (37.1 C)-99.1 F (37.3 C)] 98.7 F (37.1 C) (09/08 0415) Pulse Rate:  [86-87] 86 (09/08 0415) Resp:  [18-19] 18 (09/08 0415) BP: (101-110)/(57-76) 108/71 mmHg (09/08 0415) SpO2:  [97 %-98 %] 97 % (09/08 0415)  Intake/Output from previous day: 09/07 0701 - 09/08 0700 In: -  Out: 650 [Urine:650] Intake/Output from this shift:    Physical Exam: Neck: no adenopathy, no carotid bruit, no JVD and supple, symmetrical, trachea midline Lungs: clear to auscultation bilaterally Heart: regular rate and rhythm, S1, S2 normal and Soft systolic murmur noted Abdomen: soft, non-tender; bowel sounds normal; no masses,  no organomegaly Extremities: extremities normal, atraumatic, no cyanosis or edema  Lab Results:  Recent Labs  10/16/14 0220 10/18/14 0410  WBC 14.4* 9.4  HGB 13.2 13.2  PLT 188 175    Recent Labs  10/17/14 0220 10/18/14 0410  NA 137 137  K 3.9 4.0  CL 107 104  CO2 23 25  GLUCOSE 105* 114*  BUN 13 12  CREATININE 1.18 1.25*    Recent Labs  10/16/14 0220 10/18/14 0410  TROPONINI >65.00* 44.89*   Hepatic Function Panel  Recent Labs  10/17/14 0220  PROT 6.2*  ALBUMIN 2.9*  AST 148*  ALT 77*  ALKPHOS 40  BILITOT 1.8*   No results for input(s): CHOL in the last 72 hours. No results for input(s): PROTIME in the last 72 hours.  Imaging: Imaging results have been reviewed and No results found.  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 Possible discharge in a.m.  LOS: 3 days    Charolette Forward 10/18/2014, 8:56 AM

## 2014-10-18 NOTE — Progress Notes (Signed)
CARDIAC REHAB PHASE I   PRE:  Rate/Rhythm: 99 SR  BP:  Sitting: 101/64        SaO2: 97 RA  MODE:  Ambulation: 600 ft   POST:  Rate/Rhythm: 77 SR c/ PVCs  BP:  Sitting: 105/59         SaO2: 99 RA  Pt ambulated 600 ft on RA, handheld assist, slow, fairly steady gait, tolerated well. Pt denies CP, dizziness, DOE, declined rest stop. Pt states he has no questions regarding education. Pt to recliner after walk, feet elevated, call bell within reach.  [phone] Lenna Sciara, RN, BSN 10/18/2014 1:57 PM

## 2014-10-18 NOTE — Progress Notes (Signed)
Pt a/o, no c/o pain, pt oob ad lib, possible dc in the am, VSS pt stable

## 2014-10-18 NOTE — Progress Notes (Signed)
Occupational Therapy Treatment Patient Details Name: Carl [name] MRN: [id number] DOB: November 30, 1940 Today's Date: 10/18/2014    History of present illness Patient is 73 year old male with past medical history significant for hypertension hyperlipidemia and degenerative joint disease came to the ER with CP found to have a STEMI; underwent cath for stent to LAD (100% occulded)   OT comments  Patient progressing nicely towards OT goals, continue plan of care for now. Pt overall independent for transfers, supervision (distant) for long distant mobility. Pt states his daughter checks on him every now and then, but he lives alone. Encouraged pt to use his 3-in-1 for walk-in showers transfers, to sit on during showers for safety and energy conservation. Energy conservation handout administered and therapist went through handout with patient.    Follow Up Recommendations  No OT follow up;Supervision - Intermittent    Equipment Recommendations  None recommended by OT    Recommendations for Other Services  None at this time  Precautions / Restrictions Precautions Precautions: Fall Restrictions Weight Bearing Restrictions: No    Mobility Bed Mobility Overal bed mobility: Modified Independent General bed mobility comments: HoB flat, no rail, incr time/effort  Transfers Overall transfer level: Independent Equipment used: None Transfers: Sit to/from Stand Sit to Stand: Independent General transfer comment: slightly wide BOS    Balance Overall balance assessment: Needs assistance Sitting-balance support: No upper extremity supported;Feet supported Sitting balance-Leahy Scale: Good     Standing balance support: No upper extremity supported;During functional activity Standing balance-Leahy Scale: Fair    ADL Overall ADL's : Needs assistance/impaired General ADL Comments: Pt able to cross RLE for LB ADLs and able to reach down to LLE. Encouraged use of LH sponge for bathing. Educated  pt on energy conservation and handout given. Pt ambulated <> BR for shower stall transfer and education on toielt transfers. Encouraged use of 3-in-1 in shower for safety and for energy conservation. Pt supervision for mobility.      Cognition   Behavior During Therapy: WFL for tasks assessed/performed Overall Cognitive Status: Within Functional Limits for tasks assessed                 Pertinent Vitals/ Pain       Pain Assessment: No/denies pain   Frequency Min 2X/week     Progress Toward Goals  OT Goals(current goals can now befound in the care plan section)  Progress towards OT goals: Progressing toward goals  Acute Rehab OT Goals Patient Stated Goal: to go home  Plan Discharge plan remains appropriate    End of Session   Activity Tolerance Patient tolerated treatment well   Patient Left in bed;with call bell/phone within reach    Time: 0321-2248 OT Time Calculation (min): 18 min  Charges: OT General Charges $OT Visit: 1 Procedure OT Treatments $Self Care/Home Management : 8-22 mins  Carl [name] , MS, OTR/L, CLT Pager: [phone]  10/18/2014, 9:51 AM

## 2014-10-18 NOTE — Progress Notes (Signed)
Physical Therapy Treatment Patient Details Name: Carl [name] MRN: [id number] DOB: 03/15/1940 Today's Date: November 05, 2014    History of Present Illness Patient is 74 year old male with past medical history significant for hypertension hyperlipidemia and degenerative joint disease came to the ER with CP found to have a STEMI; underwent cath for stent to LAD (100% occulded)    PT Comments    Ambulation remains slow and with incr lateral flexion (as was his baseline s/p Lt quad tendon repair 02/2014). Scored 49/56 on Berg Balance assessment with single limb stance tasks most difficult. Encouraged to walk in halls more today.    Follow Up Recommendations  No PT follow up;Supervision - Intermittent     Equipment Recommendations  None recommended by PT    Recommendations for Other Services       Precautions / Restrictions Precautions Precautions: Fall Restrictions Weight Bearing Restrictions: No    Mobility  Bed Mobility Overal bed mobility: Modified Independent             General bed mobility comments: HoB flat, no rail, incr time/effort  Transfers Overall transfer level: Independent Equipment used: None Transfers: Sit to/from Stand Sit to Stand: Independent         General transfer comment: slightly wide BOS  Ambulation/Gait Ambulation/Gait assistance: Independent Ambulation Distance (Feet): 250 Feet Assistive device: None Gait Pattern/deviations: Step-through pattern;Decreased step length - left;Decreased stance time - right (incr lateral flexion Lt>Rt) Gait velocity: decr and unable to incr; able to decr with no difficulties   General Gait Details: feels at his baseline "just a little slower" no balance issues   Stairs            Wheelchair Mobility    Modified Rankin (Stroke Patients Only)       Balance                                    Cognition Arousal/Alertness: Awake/alert Behavior During Therapy: WFL for tasks  assessed/performed Overall Cognitive Status: Within Functional Limits for tasks assessed                      Exercises      General Comments        Pertinent Vitals/Pain Pain Assessment: No/denies pain    Home Living                      Prior Function            PT Goals (current goals can now be found in the care plan section) Acute Rehab PT Goals Patient Stated Goal: to go home PT Goal Formulation: With patient Time For Goal Achievement: 10/24/14 Potential to Achieve Goals: Good Progress towards PT goals: Progressing toward goals    Frequency  Min 3X/week    PT Plan Discharge plan needs to be updated    Co-evaluation             End of Session Equipment Utilized During Treatment: Gait belt Activity Tolerance: Patient tolerated treatment well Patient left: with call bell/phone within reach;in bed     Time: 0851-0910 PT Time Calculation (min) (ACUTE ONLY): 19 min  Charges:  $Gait Training: 8-22 mins                    G Codes:      Dalasia Predmore November 05, 2014, 9:24 AM Pager [phone]

## 2014-10-19 LAB — CBC
HCT: 40.8 % (ref 39.0–52.0)
Hemoglobin: 13.8 g/dL (ref 13.0–17.0)
MCH: 32 pg (ref 26.0–34.0)
MCHC: 33.8 g/dL (ref 30.0–36.0)
MCV: 94.7 fL (ref 78.0–100.0)
Platelets: 182 10*3/uL (ref 150–400)
RBC: 4.31 MIL/uL (ref 4.22–5.81)
RDW: 12.7 % (ref 11.5–15.5)
WBC: 7.9 10*3/uL (ref 4.0–10.5)

## 2014-10-19 LAB — BASIC METABOLIC PANEL
Anion gap: 8 (ref 5–15)
BUN: 14 mg/dL (ref 6–20)
CO2: 24 mmol/L (ref 22–32)
Calcium: 8.4 mg/dL — ABNORMAL LOW (ref 8.9–10.3)
Chloride: 107 mmol/L (ref 101–111)
Creatinine, Ser: 1.06 mg/dL (ref 0.61–1.24)
GFR calc Af Amer: >60 mL/min (ref >60)
GFR calc non Af Amer: >60 mL/min (ref >60)
Glucose, Bld: 105 mg/dL — ABNORMAL HIGH (ref 65–99)
Potassium: 3.7 mmol/L (ref 3.5–5.1)
Sodium: 139 mmol/L (ref 135–145)

## 2014-10-19 LAB — GLUCOSE, CAPILLARY
Glucose-Capillary: 111 mg/dL — ABNORMAL HIGH (ref 65–99)
Glucose-Capillary: 124 mg/dL — ABNORMAL HIGH (ref 65–99)

## 2014-10-19 LAB — TROPONIN I: Troponin I: 31.84 ng/mL (ref <0.031)

## 2014-10-19 MED ORDER — RAMIPRIL 1.25 MG PO CAPS: 1.2500 mg | ORAL_CAPSULE | Freq: Every day | ORAL | Status: DC

## 2014-10-19 MED ORDER — TICAGRELOR 90 MG PO TABS: 90.0000 mg | ORAL_TABLET | Freq: Two times a day (BID) | ORAL | Status: DC

## 2014-10-19 MED ORDER — NITROGLYCERIN 0.4 MG SL SUBL: 0.4000 mg | SUBLINGUAL_TABLET | SUBLINGUAL | Status: AC | PRN

## 2014-10-19 MED ORDER — CARVEDILOL 6.25 MG PO TABS: 6.2500 mg | ORAL_TABLET | Freq: Two times a day (BID) | ORAL | Status: DC

## 2014-10-19 MED ORDER — ATORVASTATIN CALCIUM 40 MG PO TABS: 40.0000 mg | ORAL_TABLET | Freq: Every day | ORAL | Status: AC

## 2014-10-19 NOTE — Progress Notes (Signed)
IV and telemetry removed. Discharge instructions reviewed with patient. Understanding verbalized. Awaiting daughters arrival for ride home. Will go over instructions with daughter as per patients request.

## 2014-10-19 NOTE — Discharge Summary (Signed)
Discharge summary dictated on 10/19/2014, dictation number is [id number]

## 2014-10-19 NOTE — Progress Notes (Signed)
Utilization review completed.  

## 2014-10-19 NOTE — Discharge Instructions (Signed)
° °  ° °Acute Coronary Syndrome °Acute coronary syndrome (ACS) is an urgent problem in which the blood and oxygen supply to the heart is critically deficient. ACS requires hospitalization because one or more coronary arteries may be blocked. °ACS represents a range of conditions including: °· Previous angina that is now unstable, lasts longer, happens at rest, or is more intense. °· A heart attack, with heart muscle cell injury and death. °There are three vital coronary arteries that supply the heart muscle with blood and oxygen so that it can pump blood effectively. If blockages to these arteries develop, blood flow to the heart muscle is reduced. If the heart does not get enough blood, angina may occur as the first warning sign. °SYMPTOMS  °· The most common signs of angina include: °· Tightness or squeezing in the chest. °· Feeling of heaviness on the chest. °· Discomfort in the arms, neck, back, or jaw. °· Shortness of breath and nausea. °· Cold, wet skin. °· Angina is usually brought on by physical effort or excitement which increase the oxygen needs of the heart. These states increase the blood flow needs of the heart beyond what can be delivered. °· Other symptoms that are not as common include: °· Fatigue °· Unexplained feelings of nervousness or anxiety °· Weakness °· Diarrhea °· Sometimes, you may not have noticed any symptoms at all but still suffered a cardiac injury. °TREATMENT  °· Medicines to help discomfort may include nitroglycerin (nitro) in the form of tablets or a spray for rapid relief, or longer-acting forms such as cream, patches, or capsules. (Be aware that there are many side effects and possible interactions with other drugs). °· Other medicines may be used to help the heart pump better. °· Procedures to open blocked arteries including angioplasty or stent placement to keep the arteries open. °· Open heart surgery may be needed when there are many blockages or they are in critical  locations that are best treated with surgery. °HOME CARE INSTRUCTIONS  °· Do not use any tobacco products including cigarettes, chewing tobacco, or electronic cigarettes. °· Take one baby or adult aspirin daily, if your health care provider advises. This helps reduce the risk of a heart attack. °· It is very important that you follow the angina treatment prescribed by your health care provider. Make arrangements for proper follow-up care. °· Eat a heart healthy diet with salt and fat restrictions as advised. °· Regular exercise is good for you as long as it does not cause discomfort. Do not begin any new type of exercise until you check with your health care provider. °· If you are overweight, you should lose weight. °· Try to maintain normal blood lipid levels. °· Keep your blood pressure under control as recommended by your health care provider. °· You should tell your health care provider right away about any increase in the severity or frequency of your chest discomfort or angina attacks. When you have angina, you should stop what you are doing and sit down. This may bring relief in 3 to 5 minutes. If your health care provider has prescribed nitro, take it as directed. °· If your health care provider has given you a follow-up appointment, it is very important to keep that appointment. Not keeping the appointment could result in a chronic or permanent injury, pain, and disability. If there is any problem keeping the appointment, you must call back to this facility for assistance. °SEEK IMMEDIATE MEDICAL CARE IF:  °· You develop   nausea, vomiting, or shortness of breath. °· You feel faint, lightheaded, or pass out. °· Your chest discomfort gets worse. °· You are sweating or experience sudden profound fatigue. °· You do not get relief of your chest pain after 3 doses of nitro. °· Your discomfort lasts longer than 15 minutes. °MAKE SURE YOU:  °· Understand these instructions. °· Will watch your condition. °· Will get  help right away if you are not doing well or get worse. °· Take all medicines as directed by your health care provider. °Document Released: 01/26/2005 Document Revised: 01/31/2013 Document Reviewed: 05/30/2013 °ExitCare® Patient Information ©2015 [employer]. This information is not intended to replace advice given to you by your health care provider. Make sure you discuss any questions you have with your health care provider. °Coronary Angiogram with Stent °Coronary angiography with stent placement is a procedure to widen or open a narrow blood vessel of the heart (coronary artery). When a coronary artery becomes partially blocked, it decreases blood flow to that area. This may lead to chest pain or a heart attack (myocardial infarction). Arteries may become blocked by cholesterol buildup (plaque) in the lining or wall.  °A stent is a small piece of metal that looks like a mesh or a spring. Stent placement may be done right after a coronary angiography in which a blocked artery is found or as a treatment for a heart attack.  °LET YOUR HEALTH CARE PROVIDER KNOW ABOUT: °· Any allergies you have.   °· All medicines you are taking, including vitamins, herbs, eye drops, creams, and over-the-counter medicines.   °· Previous problems you or members of your family have had with the use of anesthetics.   °· Any blood disorders you have.   °· Previous surgeries you have had.   °· Medical conditions you have. °RISKS AND COMPLICATIONS °Generally, coronary angiography with stent is a safe procedure. However, problems can occur and include: °· Damage to the heart or its blood vessels.   °· A return of blockage.   °· Bleeding, infection, or bruising at the insertion site.   °· A collection of blood under the skin (hematoma) at the insertion site. °· Blood clot in another part of the body.   °· Kidney injury.   °· Allergic reaction to the dye or contrast used.   °· Bleeding into the abdomen (retroperitoneal bleeding). °BEFORE  THE PROCEDURE °· Do not eat or drink anything after midnight on the night before the procedure or as directed by your health care provider.  °· Ask your health care provider about changing or stopping your regular medicines. This is especially important if you are taking diabetes medicines or blood thinners. °· Your health care provider will make sure you understand the procedure as well as the risks and potential problems associated with the procedure.   °PROCEDURE °· You may be given a medicine to help you relax before and during the procedure (sedative). This medicine will be given through an IV tube that is put into one of your veins.   °· The area where the catheter will be inserted will be shaved and cleaned. This is usually done in the groin but may be done in the fold of your arm (near your elbow) or in the wrist.    °· A medicine will be given to numb the area where the catheter will be inserted (local anesthetic).   °· The catheter will be inserted into an artery using a guide wire. A type of X-ray (fluoroscopy) will be used to help guide the catheter to the opening of the blocked   artery.   °· A dye will then be injected into the catheter, and X-rays will be taken. The dye will help to show where any narrowing or blockages are located in the heart arteries.   °· A tiny wire will be guided to the blocked spot, and a balloon will be inflated to make the artery wider. The stent will be expanded and will crush the plaque into the wall of the vessel. The stent will hold the area open like a scaffolding and improve the blood flow.   °· Sometimes the artery may be made wider using a laser or other tools to remove plaque.   °· When the blood flow is better, the catheter will be removed. The lining of the artery will grow over the stent, which stays where it was placed.   °AFTER THE PROCEDURE °· If the procedure is done through the leg, you will be kept in bed lying flat for about 6 hours. You will be instructed to  not bend or cross your legs.   °· The insertion site will be checked frequently.   °· The pulse in your feet or wrist will be checked frequently.   °· Additional blood tests, X-rays, and electrocardiography may be done. °Document Released: 08/02/2002 Document Revised: 06/12/2013 Document Reviewed: 08/04/2012 °ExitCare® Patient Information ©2015 [employer]. This information is not intended to replace advice given to you by your health care provider. Make sure you discuss any questions you have with your health care provider. ° °

## 2014-10-19 NOTE — Progress Notes (Signed)
Patient up to chair by physical therapy. Tolerated walk well. Denies pain.

## 2014-10-19 NOTE — Progress Notes (Signed)
Physical Therapy Treatment Patient Details Name: Carl [name] MRN: [id number] DOB: Feb 19, 1940 Today's Date: 10/19/2014    History of Present Illness Patient is 74 year old male with past medical history significant for hypertension hyperlipidemia and degenerative joint disease came to the ER with CP found to have a STEMI; underwent cath for stent to LAD (100% occulded)    PT Comments    Patient prepared from a mobility stand point to discharge home today. Ambulated without any stagger steps and did well on stairs.   Follow Up Recommendations  No PT follow up;Supervision - Intermittent     Equipment Recommendations  None recommended by PT    Recommendations for Other Services       Precautions / Restrictions Precautions Precautions: Fall Restrictions Weight Bearing Restrictions: No    Mobility  Bed Mobility Overal bed mobility: Modified Independent             General bed mobility comments: HoB flat, no rail, incr time/effort  Transfers Overall transfer level: Independent Equipment used: None   Sit to Stand: Independent         General transfer comment: slightly wide BOS  Ambulation/Gait Ambulation/Gait assistance: Independent Ambulation Distance (Feet): 150 Feet Assistive device: None Gait Pattern/deviations: Step-through pattern;Decreased stride length (incr lateral flexion Lt>Rt) Gait velocity: decr and unable to incr; able to decr with no difficulties Gait velocity interpretation: Below normal speed for age/gender General Gait Details: feels at his baseline "just a little slower" no balance issues   Stairs Stairs: Yes Stairs assistance: Supervision Stair Management: One rail Right;No rails (no rail for 1 step (as at home); rail for other steps) Number of Stairs: 10 General stair comments: pt able to demonstrate safest technique post Lt quad tendon repair  Wheelchair Mobility    Modified Rankin (Stroke Patients Only)       Balance            Standing balance support: No upper extremity supported;During functional activity Standing balance-Leahy Scale: Good                      Cognition Arousal/Alertness: Awake/alert Behavior During Therapy: WFL for tasks assessed/performed Overall Cognitive Status: Within Functional Limits for tasks assessed                      Exercises      General Comments General comments (skin integrity, edema, etc.): Pt states he has no concerns re: his mobility and d/c home. Plans to do Phase II Cardiac Rehab      Pertinent Vitals/Pain Pain Assessment: No/denies pain    Home Living                      Prior Function            PT Goals (current goals can now be found in the care plan section) Acute Rehab PT Goals Patient Stated Goal: to go home PT Goal Formulation: With patient Time For Goal Achievement: 10/24/14 Potential to Achieve Goals: Good Progress towards PT goals: Goals met/education completed, patient discharged from PT    Frequency       PT Plan Current plan remains appropriate    Co-evaluation             End of Session   Activity Tolerance: Patient tolerated treatment well Patient left: with call bell/phone within reach;in chair     Patient is being discharged from PT services secondary to:  Goals  met and no further therapy needs identified.    Please see latest Therapy Progress Note for current level of functioning and progress toward goals.  Progress and discharge plan and discussed with patient/caregiver and they  Agree      Time: 5502-7142 PT Time Calculation (min) (ACUTE ONLY): 17 min  Charges:  $Gait Training: 8-22 mins                    G Codes:      Carl [name] November 02, 2014, 8:55 AM Pager [phone]

## 2014-10-19 NOTE — Care Management Note (Signed)
Case Management Note CM note started by Alma  Patient Details  Name: Carl [name] MRN: [id number] Date of Birth: [birth year]  Subjective/Objective:    Pt to discharge home on Brilinta.  Copay will be $180/month.  Provided information and card for free 30-day trial, pt indicates he will be able to manage copay.  Pt given written information on [facility], which is mail order pharmacy preferred by [facility].                       Expected Discharge Plan:  Home/Self Care  Discharge planning Services  CM Consult, Medication Assistance  Status of Service:  Completed, signed off   10/19/2014 Elenor Quinones, RN, BSN [phone] Pt informed CM that he prefers to go to a pharmacy rather than use a mail order.  CM asked pt about preferred pharmacy, pt stated [facility] near [facility] in [place], [place] contacted pharmacy and was informed that medications is available for pick up post discharge.  CM informed pharmacist of insurance plan and was told by pharmacist that they do accept his insurance, per CM benefits check prior authorization is not needed.  Pt made aware that pharmacy can fill prescription.     Dawayne Patricia, RN 10/19/2014, 11:41 AM

## 2014-10-20 NOTE — Discharge Summary (Signed)
NAMEMarland Kitchen  Carl [name], Carl [name] NO.:  [id number]  MEDICAL RECORD NO.:  [id number]  LOCATION:  2W31C                        FACILITY:  [facility]  PHYSICIAN:  Allegra Lai. Terrence Dupont, M.D. DATE OF BIRTH:  January 12, 1941  DATE OF ADMISSION:  10/15/2014 DATE OF DISCHARGE:  10/19/2014                              DISCHARGE SUMMARY   ADMITTING DIAGNOSES: 1. Acute anterolateral wall myocardial infarction. 2. Hypertension. 3. Hyperlipidemia. 4. Elevated blood sugar, rule out diabetes mellitus. 5. Degenerative joint disease. 6. Morbid obesity.  DISCHARGE DIAGNOSES: 1. Status post acute anterolateral wall myocardial infarction, status     post percutaneous coronary intervention to 100% occluded left     anterior descending artery and diagonal 2 with excellent     angiographic results. 2. Status post hypotensive shock. 3. Hypertension. 4. Hyperlipidemia. 5. Prediabetes. 6. Degenerative joint disease. 7. Morbid obesity.  DISCHARGE HOME MEDICATIONS: 1. Aspirin 81 mg one tablet daily. 2. Atorvastatin 40 mg daily. 3. Carvedilol 6.25 mg twice daily. 4. Nitrostat sublingual p.r.n. 5. Ramipril 1.25 mg one capsule daily. 6. Brilinta 90 mg one tablet twice daily. 7. Vitamin D 1000 units daily. 8. Multivitamin with mineral one tablet daily. 9. Systane eye drop as before.  The patient has been advised to stop     amlodipine.  DIET:  Low salt, low cholesterol, 1800 calories ADA diet.  Post cardiac cath/PTCA instructions have been given.  FOLLOWUP:  Follow up with me in 1 week.  CONDITION AT DISCHARGE:  Stable.  BRIEF HISTORY AND HOSPITAL COURSE:  Carl [name] is 74 year old male with past medical history significant for hypertension, hyperlipidemia, degenerative joint disease.  He came to the ER by EMS as Code STEMI was called.  The patient complained of retrosternal chest pain, which started yesterday afternoon, around 2 p.m., did not seek any medical attention initially as he  thought it was due to acid reflux, but as the pain persisted radiating to the right arm associated with diaphoresis, called EMS.  EKG done on the field showed normal sinus rhythm with ST elevation in anterolateral leads and reciprocal changes in the inferior leads.  Code STEMI was called.  PHYSICAL EXAMINATION:  GENERAL:  The patient was alert, awake, oriented. VITAL SIGNS:  Blood pressure was 121/76, pulse 91, he was afebrile. HEENT:  Conjunctiva was pink. NECK:  Supple.  No JVD.  No bruit. LUNGS:  Clear to auscultation anterolaterally. CARDIOVASCULAR:  S1, S2 was normal.  There was soft systolic murmur and S4 gallop. ABDOMEN:  Soft.  Bowel sounds were present.  Nontender. EXTREMITIES:  There was no clubbing, cyanosis, or edema.  LABORATORY DATA:  Sodium was 137, potassium 3.1, BUN 11, creatinine 1.138.  Hemoglobin was 15.6, hematocrit 46.4.  Troponin-I first set was 1.12, repeat troponin-I was of was 65.  Yesterday, troponin-I 44.89, today is 31.84, which is trending down.  Last electrolytes; sodium 139, potassium 3.7, BUN 14, creatinine 1.06.  Hemoglobin is 13.8, hematocrit 40.8, white count of 7.9.  BRIEF HOSPITAL COURSE:  The patient was directly taken to the cath lab and underwent left cardiac cath and PTCA stenting to LAD and diagonal 2 with excellent angiographic results.  Postprocedure, the patient did not have any episodes of chest pain during  the hospital stay, although, he had episode of hypotension, requiring IV fluid bolus and subsequently was started on low-dose dopamine, which has been weaned off.  Phase 1 cardiac rehab was called.  The patient has been ambulating in room and hallway without any problems.  Discussed with the patient and family regarding skilled nursing facility.  The patient states he has help at home and his daughter can take care of him at home.  The patient will be discharged home on above medications and will be followed up in my office in 1  week.  The patient also will be scheduled for phase 2 cardiac rehab as outpatient.     Allegra Lai. Terrence Dupont, M.D.     MNH/MEDQ  D:  10/19/2014  T:  10/20/2014  Job:  [id number]

## 2014-10-24 DIAGNOSIS — I2109 ST elevation (STEMI) myocardial infarction involving other coronary artery of anterior wall: Secondary | ICD-10-CM | POA: Diagnosis not present

## 2014-10-24 DIAGNOSIS — E785 Hyperlipidemia, unspecified: Secondary | ICD-10-CM | POA: Diagnosis not present

## 2014-10-24 DIAGNOSIS — M1990 Unspecified osteoarthritis, unspecified site: Secondary | ICD-10-CM | POA: Diagnosis not present

## 2014-10-24 DIAGNOSIS — I10 Essential (primary) hypertension: Secondary | ICD-10-CM | POA: Diagnosis not present

## 2014-10-24 DIAGNOSIS — I2510 Atherosclerotic heart disease of native coronary artery without angina pectoris: Secondary | ICD-10-CM | POA: Diagnosis not present

## 2014-10-24 DIAGNOSIS — R7309 Other abnormal glucose: Secondary | ICD-10-CM | POA: Diagnosis not present

## 2014-10-24 DIAGNOSIS — E669 Obesity, unspecified: Secondary | ICD-10-CM | POA: Diagnosis not present

## 2014-10-26 ENCOUNTER — Telehealth (HOSPITAL_COMMUNITY): Payer: Self-pay | Admitting: Cardiac Rehabilitation

## 2014-10-26 NOTE — Telephone Encounter (Signed)
pc to pt enroll in cardiac rehab. Pt would like to use his [employer] and [employer] instead of [place] benefits at this time. Pt will contact customer service with [facility] to assess patient OOP expense.  Pt instructed of need to contact PCP at [facility] to begin process of [facility] choice program referral and authorization if he chooses to use that benefit.  Pt verbalized understanding.

## 2014-11-08 ENCOUNTER — Encounter (HOSPITAL_COMMUNITY)
Admission: RE | Admit: 2014-11-08 | Discharge: 2014-11-08 | Disposition: A | Discharge status: Home / Self Care | Payer: Medicare PPO | Source: Ambulatory Visit | Attending: Cardiology | Admitting: Cardiology

## 2014-11-08 NOTE — Progress Notes (Addendum)
Cardiac Rehab Medication Review by a Pharmacist  Does the patient  feel that his/her medications are working for him/her?  yes  Has the patient been experiencing any side effects to the medications prescribed?  no  Does the patient measure his/her own blood pressure or blood glucose at home?  no   Does the patient have any problems obtaining medications due to transportation or finances?   no  Understanding of regimen: good Understanding of indications: poor Potential of compliance: excellent    Pharmacist comments: Patient said he had poor understanding of what the medication were for. Went through each medication and explained why he was taking them and what they did. Cautioned the patient against just memorizing pills based off looks because that may change. Patient use pill box at home and had great compliance.   Darl Pikes, PharmD Clinical Pharmacist- Resident Pager: [phone]  Darl Pikes 11/08/2014 8:25 AM

## 2014-11-12 ENCOUNTER — Encounter (HOSPITAL_COMMUNITY)
Admission: RE | Admit: 2014-11-12 | Discharge: 2014-11-12 | Disposition: A | Discharge status: Home / Self Care | Payer: Medicare PPO | Source: Ambulatory Visit | Attending: Cardiology | Admitting: Cardiology

## 2014-11-12 DIAGNOSIS — Z955 Presence of coronary angioplasty implant and graft: Secondary | ICD-10-CM | POA: Insufficient documentation

## 2014-11-12 DIAGNOSIS — I213 ST elevation (STEMI) myocardial infarction of unspecified site: Secondary | ICD-10-CM | POA: Insufficient documentation

## 2014-11-12 NOTE — Progress Notes (Addendum)
Pt started cardiac rehab today.  Pt tolerated light exercise without difficulty. VSS, telemetry-Sinus rhythm with an occasional PVC, asymptomatic.  Medication list reconciled.  Pt verbalized compliance with medications and denies barriers to compliance. PSYCHOSOCIAL ASSESSMENT:  PHQ-0. Pt exhibits positive coping skills, hopeful outlook with supportive family. Carl [name] lives alone and has a daughter that lives in [place]. No psychosocial needs identified at this time, no psychosocial interventions necessary.    Pt enjoys sitting down and watching TV.   Pt cardiac rehab  goal is  to get stronger.  Pt encouraged to participate in exercising on your own and functional fitness  to increase ability to achieve these goals. Carl [name] is somewhat deconditioned since he had knee surgery in January of 2016   Pt's long term cardiac rehab goal is the same as his short term goal, which is to get stonger.  Pt oriented to exercise equipment and routine.  Understanding verbalized. Initially Carl [name] ECG displayed an inverted twave. The patient's red lead was repositioned and displayed Carl [name]'s ECG as upright. Today's ECG's were faxed to Dr Terrence Dupont for review. Dr Terrence Dupont reviewed today's ECG tracings no new orders received. Will continue to monitor the patient throughout  the program.

## 2014-11-14 ENCOUNTER — Encounter (HOSPITAL_COMMUNITY)
Admission: RE | Admit: 2014-11-14 | Discharge: 2014-11-14 | Disposition: A | Discharge status: Home / Self Care | Payer: Medicare PPO | Source: Ambulatory Visit | Attending: Cardiology | Admitting: Cardiology

## 2014-11-14 DIAGNOSIS — I213 ST elevation (STEMI) myocardial infarction of unspecified site: Secondary | ICD-10-CM | POA: Diagnosis not present

## 2014-11-14 DIAGNOSIS — Z955 Presence of coronary angioplasty implant and graft: Secondary | ICD-10-CM | POA: Diagnosis not present

## 2014-11-14 NOTE — Progress Notes (Signed)
Initial blood noted at 92/62 this afternoon. Patient denied feeling dizzy or lightheaded. Mr Erney was given water. Repeat blood pressure 94/60 sitting. Standing blood pressure 104/60. Dr Terrence Dupont called and notified. No new order received. Mr Goth tolerated the remainder of exercise at cardiac rehab without complaints. Vital sign remained stable. Will continue to monitor the patient throughout  the program.

## 2014-11-16 ENCOUNTER — Encounter (HOSPITAL_COMMUNITY)
Admission: RE | Admit: 2014-11-16 | Discharge: 2014-11-16 | Disposition: A | Discharge status: Home / Self Care | Payer: Medicare PPO | Source: Ambulatory Visit | Attending: Cardiology | Admitting: Cardiology

## 2014-11-16 DIAGNOSIS — Z955 Presence of coronary angioplasty implant and graft: Secondary | ICD-10-CM | POA: Diagnosis not present

## 2014-11-16 DIAGNOSIS — I213 ST elevation (STEMI) myocardial infarction of unspecified site: Secondary | ICD-10-CM | POA: Diagnosis not present

## 2014-11-19 ENCOUNTER — Encounter (HOSPITAL_COMMUNITY)
Admission: RE | Admit: 2014-11-19 | Discharge: 2014-11-19 | Disposition: A | Discharge status: Home / Self Care | Payer: Medicare PPO | Source: Ambulatory Visit | Attending: Cardiology | Admitting: Cardiology

## 2014-11-19 DIAGNOSIS — I213 ST elevation (STEMI) myocardial infarction of unspecified site: Secondary | ICD-10-CM | POA: Diagnosis not present

## 2014-11-19 DIAGNOSIS — Z955 Presence of coronary angioplasty implant and graft: Secondary | ICD-10-CM | POA: Diagnosis not present

## 2014-11-19 NOTE — Progress Notes (Signed)
Reviewed home exercise guidelines with patient including endpoints, temperature precautions, target heart rate and rate of perceived exertion. Carl [name] is currently walking 15-20 minutes daily as his mode of home exercise. Carl [name]'s goal is to increase to 30 minutes walking daily. Carl [name] voices understanding of instructions given. Sol Passer, MS, ACSM CCEP

## 2014-11-21 ENCOUNTER — Encounter (HOSPITAL_COMMUNITY)
Admission: RE | Admit: 2014-11-21 | Discharge: 2014-11-21 | Disposition: A | Discharge status: Home / Self Care | Payer: Medicare PPO | Source: Ambulatory Visit | Attending: Cardiology | Admitting: Cardiology

## 2014-11-21 DIAGNOSIS — I213 ST elevation (STEMI) myocardial infarction of unspecified site: Secondary | ICD-10-CM | POA: Diagnosis not present

## 2014-11-21 DIAGNOSIS — Z955 Presence of coronary angioplasty implant and graft: Secondary | ICD-10-CM | POA: Diagnosis not present

## 2014-11-23 ENCOUNTER — Encounter (HOSPITAL_COMMUNITY)
Admission: RE | Admit: 2014-11-23 | Discharge: 2014-11-23 | Disposition: A | Discharge status: Home / Self Care | Payer: Medicare PPO | Source: Ambulatory Visit | Attending: Cardiology | Admitting: Cardiology

## 2014-11-23 DIAGNOSIS — I213 ST elevation (STEMI) myocardial infarction of unspecified site: Secondary | ICD-10-CM | POA: Diagnosis not present

## 2014-11-23 DIAGNOSIS — Z955 Presence of coronary angioplasty implant and graft: Secondary | ICD-10-CM | POA: Diagnosis not present

## 2014-11-26 ENCOUNTER — Encounter (HOSPITAL_COMMUNITY)
Admission: RE | Admit: 2014-11-26 | Discharge: 2014-11-26 | Disposition: A | Discharge status: Home / Self Care | Payer: Medicare PPO | Source: Ambulatory Visit | Attending: Cardiology | Admitting: Cardiology

## 2014-11-26 DIAGNOSIS — Z955 Presence of coronary angioplasty implant and graft: Secondary | ICD-10-CM | POA: Diagnosis not present

## 2014-11-26 DIAGNOSIS — I213 ST elevation (STEMI) myocardial infarction of unspecified site: Secondary | ICD-10-CM | POA: Diagnosis not present

## 2014-11-28 ENCOUNTER — Encounter (HOSPITAL_COMMUNITY)
Admission: RE | Admit: 2014-11-28 | Discharge: 2014-11-28 | Disposition: A | Discharge status: Home / Self Care | Payer: Medicare PPO | Source: Ambulatory Visit | Attending: Cardiology | Admitting: Cardiology

## 2014-11-28 DIAGNOSIS — Z955 Presence of coronary angioplasty implant and graft: Secondary | ICD-10-CM | POA: Diagnosis not present

## 2014-11-28 DIAGNOSIS — I213 ST elevation (STEMI) myocardial infarction of unspecified site: Secondary | ICD-10-CM | POA: Diagnosis not present

## 2014-11-30 ENCOUNTER — Encounter (HOSPITAL_COMMUNITY)
Admission: RE | Admit: 2014-11-30 | Discharge: 2014-11-30 | Disposition: A | Discharge status: Home / Self Care | Payer: Medicare PPO | Source: Ambulatory Visit | Attending: Cardiology | Admitting: Cardiology

## 2014-11-30 DIAGNOSIS — I213 ST elevation (STEMI) myocardial infarction of unspecified site: Secondary | ICD-10-CM | POA: Diagnosis not present

## 2014-11-30 DIAGNOSIS — Z955 Presence of coronary angioplasty implant and graft: Secondary | ICD-10-CM | POA: Diagnosis not present

## 2014-11-30 NOTE — Progress Notes (Signed)
Initial blood 92/60 heart rate 71. Patient asymptomatic given water. Recheck blood pressure 94/60. Dr Zenia Resides office called and notified. I spoke with Dr Terrence Dupont. Dr Terrence Dupont instructed Carl [name] over the phone to decrease his coreg to 3.125 mg twice a day. Patient states understanding. Vital signs remained stable during exercise today. Will continue to monitor the patient throughout  the program.

## 2014-12-03 ENCOUNTER — Encounter (HOSPITAL_COMMUNITY)
Admission: RE | Admit: 2014-12-03 | Discharge: 2014-12-03 | Disposition: A | Discharge status: Home / Self Care | Payer: Medicare PPO | Source: Ambulatory Visit | Attending: Cardiology | Admitting: Cardiology

## 2014-12-03 DIAGNOSIS — Z955 Presence of coronary angioplasty implant and graft: Secondary | ICD-10-CM | POA: Diagnosis not present

## 2014-12-03 DIAGNOSIS — I213 ST elevation (STEMI) myocardial infarction of unspecified site: Secondary | ICD-10-CM | POA: Diagnosis not present

## 2014-12-03 NOTE — Progress Notes (Signed)
QUALITY OF LIFE SCORE REVIEW Patient score low in area of health and functioning with a score of 20.30.  Scores less than 21 are considered low.  Patient quality of life slightly altered by physical constraints which limits ability to perform as prior to recent cardiac illness.  Carl [name] reports feeling better since he has been participating in cardiac rehab.  Offered emotional support and reassurance.  Will continue to monitor and intervene as necessary.  Carl [name] denies being depressed. Will forward quality of life questionnaire to Dr Zenia Resides office for review.

## 2014-12-05 ENCOUNTER — Encounter (HOSPITAL_COMMUNITY)
Admission: RE | Admit: 2014-12-05 | Discharge: 2014-12-05 | Disposition: A | Discharge status: Home / Self Care | Payer: Medicare PPO | Source: Ambulatory Visit | Attending: Cardiology | Admitting: Cardiology

## 2014-12-05 DIAGNOSIS — I213 ST elevation (STEMI) myocardial infarction of unspecified site: Secondary | ICD-10-CM | POA: Diagnosis not present

## 2014-12-05 DIAGNOSIS — Z955 Presence of coronary angioplasty implant and graft: Secondary | ICD-10-CM | POA: Diagnosis not present

## 2014-12-07 ENCOUNTER — Encounter (HOSPITAL_COMMUNITY)
Admission: RE | Admit: 2014-12-07 | Discharge: 2014-12-07 | Disposition: A | Discharge status: Home / Self Care | Payer: Medicare PPO | Source: Ambulatory Visit | Attending: Cardiology | Admitting: Cardiology

## 2014-12-07 DIAGNOSIS — I213 ST elevation (STEMI) myocardial infarction of unspecified site: Secondary | ICD-10-CM | POA: Diagnosis not present

## 2014-12-07 DIAGNOSIS — Z955 Presence of coronary angioplasty implant and graft: Secondary | ICD-10-CM | POA: Diagnosis not present

## 2014-12-07 NOTE — Progress Notes (Signed)
Carl [name] 74 y.o. male Nutrition Note Spoke with pt.  Nutrition Survey reviewed with pt. Pt is working toward following the Therapeutic Lifestyle Changes diet. Pt is not eating the skin on poultry, eating fewer eggs, limiting cheese, avoiding fried food and many desserts. Pt has purchased Smart Balance to replace his butter, but has not used the Smart Balance "yet." Pt willing to change salad dressing "after I use up the 5, 16 ounce bottles of regular dressing I have." Pt expressed understanding of the information reviewed. Pt aware of nutrition education classes offered and plans on attending nutrition classes. Lab Results  Component Value Date   HGBA1C 5.3 10/15/2014   Wt Readings from Last 3 Encounters:  11/08/14 260 lb 12.9 oz (118.3 kg)  10/15/14 264 lb 8.8 oz (120 kg)  03/13/14 261 lb 3.2 oz (118.48 kg)   Nutrition Diagnosis ? Food-and nutrition-related knowledge deficit related to lack of exposure to information as related to diagnosis of: ? CVD ? Obesity related to excessive energy intake as evidenced by a BMI of 34.9  Nutrition Intervention ? Benefits of adopting Therapeutic Lifestyle Changes discussed when Medficts reviewed. ? Pt to attend the Portion Distortion class ? Pt to attend the  ? Nutrition I class                        ? Nutrition II class ? Pt given handouts for: ? Nutrition I class ? Nutrition II class ? Continue client-centered nutrition education by RD, as part of interdisciplinary care.  Goal(s) ? Pt to identify and limit food sources of saturated fat, trans fat, and cholesterol ? Pt to identify food quantities necessary to achieve: ? wt loss to a goal wt of 236-254 lb (107.4-115.6 kg) at graduation from cardiac rehab.   Monitor and Evaluate progress toward nutrition goal with team.  Derek Mound, M.Ed, RD, LDN, CDE 12/07/2014 11:13 AM

## 2014-12-10 ENCOUNTER — Encounter (HOSPITAL_COMMUNITY)
Admission: RE | Admit: 2014-12-10 | Discharge: 2014-12-10 | Disposition: A | Discharge status: Home / Self Care | Payer: Medicare PPO | Source: Ambulatory Visit | Attending: Cardiology | Admitting: Cardiology

## 2014-12-10 DIAGNOSIS — Z955 Presence of coronary angioplasty implant and graft: Secondary | ICD-10-CM | POA: Diagnosis not present

## 2014-12-10 DIAGNOSIS — I213 ST elevation (STEMI) myocardial infarction of unspecified site: Secondary | ICD-10-CM | POA: Diagnosis not present

## 2014-12-12 ENCOUNTER — Encounter (HOSPITAL_COMMUNITY)
Admission: RE | Admit: 2014-12-12 | Discharge: 2014-12-12 | Disposition: A | Discharge status: Home / Self Care | Payer: Medicare PPO | Source: Ambulatory Visit | Attending: Cardiology | Admitting: Cardiology

## 2014-12-12 DIAGNOSIS — I213 ST elevation (STEMI) myocardial infarction of unspecified site: Secondary | ICD-10-CM | POA: Insufficient documentation

## 2014-12-12 DIAGNOSIS — Z955 Presence of coronary angioplasty implant and graft: Secondary | ICD-10-CM | POA: Diagnosis not present

## 2014-12-14 ENCOUNTER — Encounter (HOSPITAL_COMMUNITY)
Admission: RE | Admit: 2014-12-14 | Discharge: 2014-12-14 | Disposition: A | Discharge status: Home / Self Care | Payer: Medicare PPO | Source: Ambulatory Visit | Attending: Cardiology | Admitting: Cardiology

## 2014-12-14 DIAGNOSIS — Z955 Presence of coronary angioplasty implant and graft: Secondary | ICD-10-CM | POA: Diagnosis not present

## 2014-12-14 DIAGNOSIS — I213 ST elevation (STEMI) myocardial infarction of unspecified site: Secondary | ICD-10-CM | POA: Diagnosis not present

## 2014-12-17 ENCOUNTER — Encounter (HOSPITAL_COMMUNITY)
Admission: RE | Admit: 2014-12-17 | Discharge: 2014-12-17 | Disposition: A | Discharge status: Home / Self Care | Payer: Medicare PPO | Source: Ambulatory Visit | Attending: Cardiology | Admitting: Cardiology

## 2014-12-17 DIAGNOSIS — Z955 Presence of coronary angioplasty implant and graft: Secondary | ICD-10-CM | POA: Diagnosis not present

## 2014-12-17 DIAGNOSIS — I213 ST elevation (STEMI) myocardial infarction of unspecified site: Secondary | ICD-10-CM | POA: Diagnosis not present

## 2014-12-19 ENCOUNTER — Encounter (HOSPITAL_COMMUNITY)
Admission: RE | Admit: 2014-12-19 | Discharge: 2014-12-19 | Disposition: A | Discharge status: Home / Self Care | Payer: Medicare PPO | Source: Ambulatory Visit | Attending: Cardiology | Admitting: Cardiology

## 2014-12-19 DIAGNOSIS — Z955 Presence of coronary angioplasty implant and graft: Secondary | ICD-10-CM | POA: Diagnosis not present

## 2014-12-19 DIAGNOSIS — I213 ST elevation (STEMI) myocardial infarction of unspecified site: Secondary | ICD-10-CM | POA: Diagnosis not present

## 2014-12-21 ENCOUNTER — Encounter (HOSPITAL_COMMUNITY)
Admission: RE | Admit: 2014-12-21 | Discharge: 2014-12-21 | Disposition: A | Discharge status: Home / Self Care | Payer: Medicare PPO | Source: Ambulatory Visit | Attending: Cardiology | Admitting: Cardiology

## 2014-12-21 DIAGNOSIS — Z955 Presence of coronary angioplasty implant and graft: Secondary | ICD-10-CM | POA: Diagnosis not present

## 2014-12-21 DIAGNOSIS — I213 ST elevation (STEMI) myocardial infarction of unspecified site: Secondary | ICD-10-CM | POA: Diagnosis not present

## 2014-12-24 ENCOUNTER — Encounter (HOSPITAL_COMMUNITY)
Admission: RE | Admit: 2014-12-24 | Discharge: 2014-12-24 | Disposition: A | Discharge status: Home / Self Care | Payer: Medicare PPO | Source: Ambulatory Visit | Attending: Cardiology | Admitting: Cardiology

## 2014-12-24 DIAGNOSIS — I213 ST elevation (STEMI) myocardial infarction of unspecified site: Secondary | ICD-10-CM | POA: Diagnosis not present

## 2014-12-24 DIAGNOSIS — Z955 Presence of coronary angioplasty implant and graft: Secondary | ICD-10-CM | POA: Diagnosis not present

## 2014-12-26 ENCOUNTER — Encounter (HOSPITAL_COMMUNITY)
Admission: RE | Admit: 2014-12-26 | Discharge: 2014-12-26 | Disposition: A | Discharge status: Home / Self Care | Payer: Medicare PPO | Source: Ambulatory Visit | Attending: Cardiology | Admitting: Cardiology

## 2014-12-26 DIAGNOSIS — I213 ST elevation (STEMI) myocardial infarction of unspecified site: Secondary | ICD-10-CM | POA: Diagnosis not present

## 2014-12-26 DIAGNOSIS — Z955 Presence of coronary angioplasty implant and graft: Secondary | ICD-10-CM | POA: Diagnosis not present

## 2014-12-28 ENCOUNTER — Encounter (HOSPITAL_COMMUNITY)
Admission: RE | Admit: 2014-12-28 | Discharge: 2014-12-28 | Disposition: A | Discharge status: Home / Self Care | Payer: Medicare PPO | Source: Ambulatory Visit | Attending: Cardiology | Admitting: Cardiology

## 2014-12-28 DIAGNOSIS — I213 ST elevation (STEMI) myocardial infarction of unspecified site: Secondary | ICD-10-CM | POA: Diagnosis not present

## 2014-12-28 DIAGNOSIS — Z955 Presence of coronary angioplasty implant and graft: Secondary | ICD-10-CM | POA: Diagnosis not present

## 2014-12-31 ENCOUNTER — Encounter (HOSPITAL_COMMUNITY)
Admission: RE | Admit: 2014-12-31 | Discharge: 2014-12-31 | Disposition: A | Discharge status: Home / Self Care | Payer: Medicare PPO | Source: Ambulatory Visit | Attending: Cardiology | Admitting: Cardiology

## 2014-12-31 DIAGNOSIS — I213 ST elevation (STEMI) myocardial infarction of unspecified site: Secondary | ICD-10-CM | POA: Diagnosis not present

## 2014-12-31 DIAGNOSIS — Z955 Presence of coronary angioplasty implant and graft: Secondary | ICD-10-CM | POA: Diagnosis not present

## 2015-01-02 ENCOUNTER — Encounter (HOSPITAL_COMMUNITY)
Admission: RE | Admit: 2015-01-02 | Discharge: 2015-01-02 | Disposition: A | Discharge status: Home / Self Care | Payer: Medicare PPO | Source: Ambulatory Visit | Attending: Cardiology | Admitting: Cardiology

## 2015-01-02 DIAGNOSIS — Z955 Presence of coronary angioplasty implant and graft: Secondary | ICD-10-CM | POA: Diagnosis not present

## 2015-01-02 DIAGNOSIS — I213 ST elevation (STEMI) myocardial infarction of unspecified site: Secondary | ICD-10-CM | POA: Diagnosis not present

## 2015-01-07 ENCOUNTER — Encounter (HOSPITAL_COMMUNITY)
Admission: RE | Admit: 2015-01-07 | Discharge: 2015-01-07 | Disposition: A | Discharge status: Home / Self Care | Payer: Medicare PPO | Source: Ambulatory Visit | Attending: Cardiology | Admitting: Cardiology

## 2015-01-07 DIAGNOSIS — I213 ST elevation (STEMI) myocardial infarction of unspecified site: Secondary | ICD-10-CM | POA: Diagnosis not present

## 2015-01-07 DIAGNOSIS — Z955 Presence of coronary angioplasty implant and graft: Secondary | ICD-10-CM | POA: Diagnosis not present

## 2015-01-09 ENCOUNTER — Encounter (HOSPITAL_COMMUNITY)
Admission: RE | Admit: 2015-01-09 | Discharge: 2015-01-09 | Disposition: A | Discharge status: Home / Self Care | Payer: Medicare PPO | Source: Ambulatory Visit | Attending: Cardiology | Admitting: Cardiology

## 2015-01-09 DIAGNOSIS — Z955 Presence of coronary angioplasty implant and graft: Secondary | ICD-10-CM | POA: Diagnosis not present

## 2015-01-09 DIAGNOSIS — I213 ST elevation (STEMI) myocardial infarction of unspecified site: Secondary | ICD-10-CM | POA: Diagnosis not present

## 2015-01-11 ENCOUNTER — Encounter (HOSPITAL_COMMUNITY)
Admission: RE | Admit: 2015-01-11 | Discharge: 2015-01-11 | Disposition: A | Discharge status: Home / Self Care | Payer: Medicare PPO | Source: Ambulatory Visit | Attending: Cardiology | Admitting: Cardiology

## 2015-01-11 DIAGNOSIS — Z955 Presence of coronary angioplasty implant and graft: Secondary | ICD-10-CM | POA: Diagnosis not present

## 2015-01-11 DIAGNOSIS — I213 ST elevation (STEMI) myocardial infarction of unspecified site: Secondary | ICD-10-CM | POA: Insufficient documentation

## 2015-01-14 ENCOUNTER — Encounter (HOSPITAL_COMMUNITY)
Admission: RE | Admit: 2015-01-14 | Discharge: 2015-01-14 | Disposition: A | Discharge status: Home / Self Care | Payer: Medicare PPO | Source: Ambulatory Visit | Attending: Cardiology | Admitting: Cardiology

## 2015-01-14 DIAGNOSIS — I213 ST elevation (STEMI) myocardial infarction of unspecified site: Secondary | ICD-10-CM | POA: Diagnosis not present

## 2015-01-14 DIAGNOSIS — Z955 Presence of coronary angioplasty implant and graft: Secondary | ICD-10-CM | POA: Diagnosis not present

## 2015-01-16 ENCOUNTER — Encounter (HOSPITAL_COMMUNITY)
Admission: RE | Admit: 2015-01-16 | Discharge: 2015-01-16 | Disposition: A | Discharge status: Home / Self Care | Payer: Medicare PPO | Source: Ambulatory Visit | Attending: Cardiology | Admitting: Cardiology

## 2015-01-16 DIAGNOSIS — Z955 Presence of coronary angioplasty implant and graft: Secondary | ICD-10-CM | POA: Diagnosis not present

## 2015-01-16 DIAGNOSIS — I213 ST elevation (STEMI) myocardial infarction of unspecified site: Secondary | ICD-10-CM | POA: Diagnosis not present

## 2015-01-18 ENCOUNTER — Encounter (HOSPITAL_COMMUNITY)
Admission: RE | Admit: 2015-01-18 | Discharge: 2015-01-18 | Disposition: A | Discharge status: Home / Self Care | Payer: Medicare PPO | Source: Ambulatory Visit | Attending: Cardiology | Admitting: Cardiology

## 2015-01-18 DIAGNOSIS — Z955 Presence of coronary angioplasty implant and graft: Secondary | ICD-10-CM | POA: Diagnosis not present

## 2015-01-18 DIAGNOSIS — I213 ST elevation (STEMI) myocardial infarction of unspecified site: Secondary | ICD-10-CM | POA: Diagnosis not present

## 2015-01-21 ENCOUNTER — Encounter (HOSPITAL_COMMUNITY)
Admission: RE | Admit: 2015-01-21 | Discharge: 2015-01-21 | Disposition: A | Discharge status: Home / Self Care | Payer: Medicare PPO | Source: Ambulatory Visit | Attending: Cardiology | Admitting: Cardiology

## 2015-01-21 DIAGNOSIS — Z955 Presence of coronary angioplasty implant and graft: Secondary | ICD-10-CM | POA: Diagnosis not present

## 2015-01-21 DIAGNOSIS — I213 ST elevation (STEMI) myocardial infarction of unspecified site: Secondary | ICD-10-CM | POA: Diagnosis not present

## 2015-01-23 ENCOUNTER — Encounter (HOSPITAL_COMMUNITY)
Admission: RE | Admit: 2015-01-23 | Discharge: 2015-01-23 | Disposition: A | Discharge status: Home / Self Care | Payer: Medicare PPO | Source: Ambulatory Visit | Attending: Cardiology | Admitting: Cardiology

## 2015-01-23 DIAGNOSIS — I2510 Atherosclerotic heart disease of native coronary artery without angina pectoris: Secondary | ICD-10-CM | POA: Diagnosis not present

## 2015-01-23 DIAGNOSIS — M1990 Unspecified osteoarthritis, unspecified site: Secondary | ICD-10-CM | POA: Diagnosis not present

## 2015-01-23 DIAGNOSIS — R7309 Other abnormal glucose: Secondary | ICD-10-CM | POA: Diagnosis not present

## 2015-01-23 DIAGNOSIS — I10 Essential (primary) hypertension: Secondary | ICD-10-CM | POA: Diagnosis not present

## 2015-01-23 DIAGNOSIS — E669 Obesity, unspecified: Secondary | ICD-10-CM | POA: Diagnosis not present

## 2015-01-23 DIAGNOSIS — I252 Old myocardial infarction: Secondary | ICD-10-CM | POA: Diagnosis not present

## 2015-01-23 DIAGNOSIS — E785 Hyperlipidemia, unspecified: Secondary | ICD-10-CM | POA: Diagnosis not present

## 2015-01-23 DIAGNOSIS — Z955 Presence of coronary angioplasty implant and graft: Secondary | ICD-10-CM | POA: Diagnosis not present

## 2015-01-23 DIAGNOSIS — I213 ST elevation (STEMI) myocardial infarction of unspecified site: Secondary | ICD-10-CM | POA: Diagnosis not present

## 2015-01-24 DIAGNOSIS — I10 Essential (primary) hypertension: Secondary | ICD-10-CM | POA: Diagnosis not present

## 2015-01-24 DIAGNOSIS — M1990 Unspecified osteoarthritis, unspecified site: Secondary | ICD-10-CM | POA: Diagnosis not present

## 2015-01-24 DIAGNOSIS — R7309 Other abnormal glucose: Secondary | ICD-10-CM | POA: Diagnosis not present

## 2015-01-24 DIAGNOSIS — E669 Obesity, unspecified: Secondary | ICD-10-CM | POA: Diagnosis not present

## 2015-01-24 DIAGNOSIS — I2510 Atherosclerotic heart disease of native coronary artery without angina pectoris: Secondary | ICD-10-CM | POA: Diagnosis not present

## 2015-01-24 DIAGNOSIS — I252 Old myocardial infarction: Secondary | ICD-10-CM | POA: Diagnosis not present

## 2015-01-24 DIAGNOSIS — E785 Hyperlipidemia, unspecified: Secondary | ICD-10-CM | POA: Diagnosis not present

## 2015-01-25 ENCOUNTER — Encounter (HOSPITAL_COMMUNITY)
Admission: RE | Admit: 2015-01-25 | Discharge: 2015-01-25 | Disposition: A | Discharge status: Home / Self Care | Payer: Medicare PPO | Source: Ambulatory Visit | Attending: Cardiology | Admitting: Cardiology

## 2015-01-25 DIAGNOSIS — Z955 Presence of coronary angioplasty implant and graft: Secondary | ICD-10-CM | POA: Diagnosis not present

## 2015-01-25 DIAGNOSIS — I213 ST elevation (STEMI) myocardial infarction of unspecified site: Secondary | ICD-10-CM | POA: Diagnosis not present

## 2015-01-28 ENCOUNTER — Encounter (HOSPITAL_COMMUNITY)
Admission: RE | Admit: 2015-01-28 | Discharge: 2015-01-28 | Disposition: A | Discharge status: Home / Self Care | Payer: Medicare PPO | Source: Ambulatory Visit | Attending: Cardiology | Admitting: Cardiology

## 2015-01-28 DIAGNOSIS — I213 ST elevation (STEMI) myocardial infarction of unspecified site: Secondary | ICD-10-CM | POA: Diagnosis not present

## 2015-01-28 DIAGNOSIS — Z955 Presence of coronary angioplasty implant and graft: Secondary | ICD-10-CM | POA: Diagnosis not present

## 2015-01-30 ENCOUNTER — Encounter (HOSPITAL_COMMUNITY)
Admission: RE | Admit: 2015-01-30 | Discharge: 2015-01-30 | Disposition: A | Discharge status: Home / Self Care | Payer: Medicare PPO | Source: Ambulatory Visit | Attending: Cardiology | Admitting: Cardiology

## 2015-01-30 DIAGNOSIS — I213 ST elevation (STEMI) myocardial infarction of unspecified site: Secondary | ICD-10-CM | POA: Diagnosis not present

## 2015-01-30 DIAGNOSIS — Z955 Presence of coronary angioplasty implant and graft: Secondary | ICD-10-CM | POA: Diagnosis not present

## 2015-02-01 ENCOUNTER — Encounter (HOSPITAL_COMMUNITY)
Admission: RE | Admit: 2015-02-01 | Discharge: 2015-02-01 | Disposition: A | Discharge status: Home / Self Care | Payer: Medicare PPO | Source: Ambulatory Visit | Attending: Cardiology | Admitting: Cardiology

## 2015-02-01 DIAGNOSIS — I213 ST elevation (STEMI) myocardial infarction of unspecified site: Secondary | ICD-10-CM | POA: Diagnosis not present

## 2015-02-01 DIAGNOSIS — Z955 Presence of coronary angioplasty implant and graft: Secondary | ICD-10-CM | POA: Diagnosis not present

## 2015-02-06 ENCOUNTER — Encounter (HOSPITAL_COMMUNITY)
Admission: RE | Admit: 2015-02-06 | Discharge: 2015-02-06 | Disposition: A | Discharge status: Home / Self Care | Payer: Medicare PPO | Source: Ambulatory Visit | Attending: Cardiology | Admitting: Cardiology

## 2015-02-06 DIAGNOSIS — Z955 Presence of coronary angioplasty implant and graft: Secondary | ICD-10-CM | POA: Diagnosis not present

## 2015-02-06 DIAGNOSIS — I213 ST elevation (STEMI) myocardial infarction of unspecified site: Secondary | ICD-10-CM | POA: Diagnosis not present

## 2015-02-06 NOTE — Progress Notes (Signed)
Pt graduated from cardiac rehab program today with completion of 36 exercise sessions in Phase II. Pt maintained good attendance and progressed nicely during his participation in rehab as evidenced by increased MET level.   Medication list reconciled. Repeat  PHQ score-0  .  Pt has made significant lifestyle changes and should be commended for his success. Pt feels he has achieved his goals during cardiac rehab.   Pt plans to continue exercise at the [facility].Marland Kitchen

## 2015-02-08 ENCOUNTER — Encounter (HOSPITAL_COMMUNITY): Payer: Medicare PPO

## 2015-02-13 ENCOUNTER — Encounter (HOSPITAL_COMMUNITY): Payer: Medicare PPO

## 2015-02-15 ENCOUNTER — Encounter (HOSPITAL_COMMUNITY): Payer: Medicare PPO

## 2015-03-25 DIAGNOSIS — E785 Hyperlipidemia, unspecified: Secondary | ICD-10-CM | POA: Diagnosis not present

## 2015-03-25 DIAGNOSIS — Z6831 Body mass index (BMI) 31.0-31.9, adult: Secondary | ICD-10-CM | POA: Diagnosis not present

## 2015-03-25 DIAGNOSIS — E669 Obesity, unspecified: Secondary | ICD-10-CM | POA: Diagnosis not present

## 2015-03-25 DIAGNOSIS — H9193 Unspecified hearing loss, bilateral: Secondary | ICD-10-CM | POA: Diagnosis not present

## 2015-03-25 DIAGNOSIS — Z7901 Long term (current) use of anticoagulants: Secondary | ICD-10-CM | POA: Diagnosis not present

## 2015-03-25 DIAGNOSIS — I10 Essential (primary) hypertension: Secondary | ICD-10-CM | POA: Diagnosis not present

## 2015-03-25 DIAGNOSIS — I252 Old myocardial infarction: Secondary | ICD-10-CM | POA: Diagnosis not present

## 2015-04-24 DIAGNOSIS — I2510 Atherosclerotic heart disease of native coronary artery without angina pectoris: Secondary | ICD-10-CM | POA: Diagnosis not present

## 2015-04-24 DIAGNOSIS — E669 Obesity, unspecified: Secondary | ICD-10-CM | POA: Diagnosis not present

## 2015-04-24 DIAGNOSIS — I252 Old myocardial infarction: Secondary | ICD-10-CM | POA: Diagnosis not present

## 2015-04-24 DIAGNOSIS — M1990 Unspecified osteoarthritis, unspecified site: Secondary | ICD-10-CM | POA: Diagnosis not present

## 2015-04-24 DIAGNOSIS — E785 Hyperlipidemia, unspecified: Secondary | ICD-10-CM | POA: Diagnosis not present

## 2015-04-24 DIAGNOSIS — I10 Essential (primary) hypertension: Secondary | ICD-10-CM | POA: Diagnosis not present

## 2015-04-24 DIAGNOSIS — R7309 Other abnormal glucose: Secondary | ICD-10-CM | POA: Diagnosis not present

## 2015-08-01 DIAGNOSIS — I2510 Atherosclerotic heart disease of native coronary artery without angina pectoris: Secondary | ICD-10-CM | POA: Diagnosis not present

## 2015-08-01 DIAGNOSIS — E669 Obesity, unspecified: Secondary | ICD-10-CM | POA: Diagnosis not present

## 2015-08-01 DIAGNOSIS — I252 Old myocardial infarction: Secondary | ICD-10-CM | POA: Diagnosis not present

## 2015-08-01 DIAGNOSIS — M1990 Unspecified osteoarthritis, unspecified site: Secondary | ICD-10-CM | POA: Diagnosis not present

## 2015-08-01 DIAGNOSIS — E785 Hyperlipidemia, unspecified: Secondary | ICD-10-CM | POA: Diagnosis not present

## 2015-08-01 DIAGNOSIS — I10 Essential (primary) hypertension: Secondary | ICD-10-CM | POA: Diagnosis not present

## 2015-08-01 DIAGNOSIS — R7309 Other abnormal glucose: Secondary | ICD-10-CM | POA: Diagnosis not present

## 2015-08-05 DIAGNOSIS — R7309 Other abnormal glucose: Secondary | ICD-10-CM | POA: Diagnosis not present

## 2015-08-05 DIAGNOSIS — I10 Essential (primary) hypertension: Secondary | ICD-10-CM | POA: Diagnosis not present

## 2015-08-05 DIAGNOSIS — I2510 Atherosclerotic heart disease of native coronary artery without angina pectoris: Secondary | ICD-10-CM | POA: Diagnosis not present

## 2015-08-05 DIAGNOSIS — E785 Hyperlipidemia, unspecified: Secondary | ICD-10-CM | POA: Diagnosis not present

## 2015-09-10 DIAGNOSIS — H35033 Hypertensive retinopathy, bilateral: Secondary | ICD-10-CM | POA: Diagnosis not present

## 2015-09-10 DIAGNOSIS — R634 Abnormal weight loss: Secondary | ICD-10-CM | POA: Diagnosis not present

## 2015-09-10 DIAGNOSIS — H25013 Cortical age-related cataract, bilateral: Secondary | ICD-10-CM | POA: Diagnosis not present

## 2015-09-10 DIAGNOSIS — H40013 Open angle with borderline findings, low risk, bilateral: Secondary | ICD-10-CM | POA: Diagnosis not present

## 2015-09-10 DIAGNOSIS — Z0000 Encounter for general adult medical examination without abnormal findings: Secondary | ICD-10-CM | POA: Diagnosis not present

## 2015-09-10 DIAGNOSIS — H2513 Age-related nuclear cataract, bilateral: Secondary | ICD-10-CM | POA: Diagnosis not present

## 2015-09-10 DIAGNOSIS — I10 Essential (primary) hypertension: Secondary | ICD-10-CM | POA: Diagnosis not present

## 2015-10-31 DIAGNOSIS — R7309 Other abnormal glucose: Secondary | ICD-10-CM | POA: Diagnosis not present

## 2015-10-31 DIAGNOSIS — I252 Old myocardial infarction: Secondary | ICD-10-CM | POA: Diagnosis not present

## 2015-10-31 DIAGNOSIS — E669 Obesity, unspecified: Secondary | ICD-10-CM | POA: Diagnosis not present

## 2015-10-31 DIAGNOSIS — E785 Hyperlipidemia, unspecified: Secondary | ICD-10-CM | POA: Diagnosis not present

## 2015-10-31 DIAGNOSIS — M1990 Unspecified osteoarthritis, unspecified site: Secondary | ICD-10-CM | POA: Diagnosis not present

## 2015-10-31 DIAGNOSIS — I2510 Atherosclerotic heart disease of native coronary artery without angina pectoris: Secondary | ICD-10-CM | POA: Diagnosis not present

## 2015-10-31 DIAGNOSIS — I10 Essential (primary) hypertension: Secondary | ICD-10-CM | POA: Diagnosis not present

## 2016-01-30 DIAGNOSIS — E669 Obesity, unspecified: Secondary | ICD-10-CM | POA: Diagnosis not present

## 2016-01-30 DIAGNOSIS — I252 Old myocardial infarction: Secondary | ICD-10-CM | POA: Diagnosis not present

## 2016-01-30 DIAGNOSIS — I10 Essential (primary) hypertension: Secondary | ICD-10-CM | POA: Diagnosis not present

## 2016-01-30 DIAGNOSIS — R7309 Other abnormal glucose: Secondary | ICD-10-CM | POA: Diagnosis not present

## 2016-01-30 DIAGNOSIS — E785 Hyperlipidemia, unspecified: Secondary | ICD-10-CM | POA: Diagnosis not present

## 2016-01-30 DIAGNOSIS — M1990 Unspecified osteoarthritis, unspecified site: Secondary | ICD-10-CM | POA: Diagnosis not present

## 2016-01-30 DIAGNOSIS — I2510 Atherosclerotic heart disease of native coronary artery without angina pectoris: Secondary | ICD-10-CM | POA: Diagnosis not present

## 2016-04-30 DIAGNOSIS — R7309 Other abnormal glucose: Secondary | ICD-10-CM | POA: Diagnosis not present

## 2016-04-30 DIAGNOSIS — I2510 Atherosclerotic heart disease of native coronary artery without angina pectoris: Secondary | ICD-10-CM | POA: Diagnosis not present

## 2016-04-30 DIAGNOSIS — E785 Hyperlipidemia, unspecified: Secondary | ICD-10-CM | POA: Diagnosis not present

## 2016-04-30 DIAGNOSIS — M1990 Unspecified osteoarthritis, unspecified site: Secondary | ICD-10-CM | POA: Diagnosis not present

## 2016-04-30 DIAGNOSIS — I252 Old myocardial infarction: Secondary | ICD-10-CM | POA: Diagnosis not present

## 2016-04-30 DIAGNOSIS — I10 Essential (primary) hypertension: Secondary | ICD-10-CM | POA: Diagnosis not present

## 2016-05-06 DIAGNOSIS — I10 Essential (primary) hypertension: Secondary | ICD-10-CM | POA: Diagnosis not present

## 2016-05-06 DIAGNOSIS — R7309 Other abnormal glucose: Secondary | ICD-10-CM | POA: Diagnosis not present

## 2016-05-06 DIAGNOSIS — E785 Hyperlipidemia, unspecified: Secondary | ICD-10-CM | POA: Diagnosis not present

## 2016-05-06 DIAGNOSIS — I2510 Atherosclerotic heart disease of native coronary artery without angina pectoris: Secondary | ICD-10-CM | POA: Diagnosis not present

## 2016-07-30 DIAGNOSIS — E669 Obesity, unspecified: Secondary | ICD-10-CM | POA: Diagnosis not present

## 2016-07-30 DIAGNOSIS — I252 Old myocardial infarction: Secondary | ICD-10-CM | POA: Diagnosis not present

## 2016-07-30 DIAGNOSIS — M1990 Unspecified osteoarthritis, unspecified site: Secondary | ICD-10-CM | POA: Diagnosis not present

## 2016-07-30 DIAGNOSIS — I10 Essential (primary) hypertension: Secondary | ICD-10-CM | POA: Diagnosis not present

## 2016-07-30 DIAGNOSIS — E785 Hyperlipidemia, unspecified: Secondary | ICD-10-CM | POA: Diagnosis not present

## 2016-07-30 DIAGNOSIS — R7309 Other abnormal glucose: Secondary | ICD-10-CM | POA: Diagnosis not present

## 2016-07-30 DIAGNOSIS — I2510 Atherosclerotic heart disease of native coronary artery without angina pectoris: Secondary | ICD-10-CM | POA: Diagnosis not present

## 2016-08-17 DIAGNOSIS — E785 Hyperlipidemia, unspecified: Secondary | ICD-10-CM | POA: Diagnosis not present

## 2016-08-17 DIAGNOSIS — I10 Essential (primary) hypertension: Secondary | ICD-10-CM | POA: Diagnosis not present

## 2016-08-17 DIAGNOSIS — Z6829 Body mass index (BMI) 29.0-29.9, adult: Secondary | ICD-10-CM | POA: Diagnosis not present

## 2016-10-08 DIAGNOSIS — H35033 Hypertensive retinopathy, bilateral: Secondary | ICD-10-CM | POA: Diagnosis not present

## 2016-10-08 DIAGNOSIS — H40013 Open angle with borderline findings, low risk, bilateral: Secondary | ICD-10-CM | POA: Diagnosis not present

## 2016-10-08 DIAGNOSIS — H25013 Cortical age-related cataract, bilateral: Secondary | ICD-10-CM | POA: Diagnosis not present

## 2016-10-08 DIAGNOSIS — H2513 Age-related nuclear cataract, bilateral: Secondary | ICD-10-CM | POA: Diagnosis not present

## 2016-10-13 DIAGNOSIS — Z0000 Encounter for general adult medical examination without abnormal findings: Secondary | ICD-10-CM | POA: Diagnosis not present

## 2016-11-11 ENCOUNTER — Other Ambulatory Visit: Payer: Self-pay | Admitting: Cardiology

## 2016-11-11 DIAGNOSIS — R079 Chest pain, unspecified: Secondary | ICD-10-CM

## 2016-11-11 DIAGNOSIS — I252 Old myocardial infarction: Secondary | ICD-10-CM | POA: Diagnosis not present

## 2016-11-11 DIAGNOSIS — M1990 Unspecified osteoarthritis, unspecified site: Secondary | ICD-10-CM | POA: Diagnosis not present

## 2016-11-11 DIAGNOSIS — E785 Hyperlipidemia, unspecified: Secondary | ICD-10-CM | POA: Diagnosis not present

## 2016-11-11 DIAGNOSIS — I25110 Atherosclerotic heart disease of native coronary artery with unstable angina pectoris: Secondary | ICD-10-CM | POA: Diagnosis not present

## 2016-11-11 DIAGNOSIS — I10 Essential (primary) hypertension: Secondary | ICD-10-CM | POA: Diagnosis not present

## 2016-11-11 DIAGNOSIS — R7309 Other abnormal glucose: Secondary | ICD-10-CM | POA: Diagnosis not present

## 2016-11-11 DIAGNOSIS — E663 Overweight: Secondary | ICD-10-CM | POA: Diagnosis not present

## 2016-11-30 ENCOUNTER — Ambulatory Visit (HOSPITAL_COMMUNITY)
Admission: RE | Admit: 2016-11-30 | Discharge: 2016-11-30 | Disposition: A | Discharge status: Home / Self Care | Payer: Medicare PPO | Source: Ambulatory Visit | Attending: Cardiology | Admitting: Cardiology

## 2016-11-30 DIAGNOSIS — E785 Hyperlipidemia, unspecified: Secondary | ICD-10-CM | POA: Diagnosis not present

## 2016-11-30 DIAGNOSIS — I10 Essential (primary) hypertension: Secondary | ICD-10-CM | POA: Diagnosis not present

## 2016-11-30 DIAGNOSIS — R079 Chest pain, unspecified: Secondary | ICD-10-CM | POA: Insufficient documentation

## 2016-11-30 DIAGNOSIS — I252 Old myocardial infarction: Secondary | ICD-10-CM | POA: Diagnosis not present

## 2016-11-30 DIAGNOSIS — I517 Cardiomegaly: Secondary | ICD-10-CM | POA: Insufficient documentation

## 2016-11-30 DIAGNOSIS — I219 Acute myocardial infarction, unspecified: Secondary | ICD-10-CM | POA: Insufficient documentation

## 2016-11-30 DIAGNOSIS — R7309 Other abnormal glucose: Secondary | ICD-10-CM | POA: Diagnosis not present

## 2016-11-30 DIAGNOSIS — I25118 Atherosclerotic heart disease of native coronary artery with other forms of angina pectoris: Secondary | ICD-10-CM | POA: Diagnosis not present

## 2016-11-30 MED ORDER — TECHNETIUM TC 99M TETROFOSMIN IV KIT
10.0000 | PACK | Freq: Once | INTRAVENOUS | Status: AC | PRN
  Administered 2016-11-30: 10 via INTRAVENOUS

## 2016-11-30 MED ORDER — REGADENOSON 0.4 MG/5ML IV SOLN
0.4000 mg | Freq: Once | INTRAVENOUS | Status: AC
  Administered 2016-11-30: 0.4 mg via INTRAVENOUS

## 2016-11-30 MED ORDER — TECHNETIUM TC 99M TETROFOSMIN IV KIT: 30.0000 | PACK | Freq: Once | INTRAVENOUS | Status: DC | PRN

## 2016-11-30 MED ORDER — REGADENOSON 0.4 MG/5ML IV SOLN
INTRAVENOUS | Status: AC
  Filled 2016-11-30: qty 5

## 2017-02-10 DIAGNOSIS — I252 Old myocardial infarction: Secondary | ICD-10-CM | POA: Diagnosis not present

## 2017-02-10 DIAGNOSIS — R7309 Other abnormal glucose: Secondary | ICD-10-CM | POA: Diagnosis not present

## 2017-02-10 DIAGNOSIS — M1990 Unspecified osteoarthritis, unspecified site: Secondary | ICD-10-CM | POA: Diagnosis not present

## 2017-02-10 DIAGNOSIS — I10 Essential (primary) hypertension: Secondary | ICD-10-CM | POA: Diagnosis not present

## 2017-02-10 DIAGNOSIS — I2510 Atherosclerotic heart disease of native coronary artery without angina pectoris: Secondary | ICD-10-CM | POA: Diagnosis not present

## 2017-02-10 DIAGNOSIS — E785 Hyperlipidemia, unspecified: Secondary | ICD-10-CM | POA: Diagnosis not present

## 2017-05-12 DIAGNOSIS — I2510 Atherosclerotic heart disease of native coronary artery without angina pectoris: Secondary | ICD-10-CM | POA: Diagnosis not present

## 2017-05-12 DIAGNOSIS — R7309 Other abnormal glucose: Secondary | ICD-10-CM | POA: Diagnosis not present

## 2017-05-12 DIAGNOSIS — I10 Essential (primary) hypertension: Secondary | ICD-10-CM | POA: Diagnosis not present

## 2017-05-12 DIAGNOSIS — I252 Old myocardial infarction: Secondary | ICD-10-CM | POA: Diagnosis not present

## 2017-05-12 DIAGNOSIS — M1990 Unspecified osteoarthritis, unspecified site: Secondary | ICD-10-CM | POA: Diagnosis not present

## 2017-05-12 DIAGNOSIS — E785 Hyperlipidemia, unspecified: Secondary | ICD-10-CM | POA: Diagnosis not present

## 2017-05-13 DIAGNOSIS — E785 Hyperlipidemia, unspecified: Secondary | ICD-10-CM | POA: Diagnosis not present

## 2017-05-13 DIAGNOSIS — I2510 Atherosclerotic heart disease of native coronary artery without angina pectoris: Secondary | ICD-10-CM | POA: Diagnosis not present

## 2017-05-13 DIAGNOSIS — I10 Essential (primary) hypertension: Secondary | ICD-10-CM | POA: Diagnosis not present

## 2017-05-13 DIAGNOSIS — R7303 Prediabetes: Secondary | ICD-10-CM | POA: Diagnosis not present

## 2017-08-11 DIAGNOSIS — R0609 Other forms of dyspnea: Secondary | ICD-10-CM | POA: Diagnosis not present

## 2017-08-11 DIAGNOSIS — I255 Ischemic cardiomyopathy: Secondary | ICD-10-CM | POA: Diagnosis not present

## 2017-08-11 DIAGNOSIS — E785 Hyperlipidemia, unspecified: Secondary | ICD-10-CM | POA: Diagnosis not present

## 2017-08-11 DIAGNOSIS — R7309 Other abnormal glucose: Secondary | ICD-10-CM | POA: Diagnosis not present

## 2017-08-11 DIAGNOSIS — I10 Essential (primary) hypertension: Secondary | ICD-10-CM | POA: Diagnosis not present

## 2017-08-11 DIAGNOSIS — I252 Old myocardial infarction: Secondary | ICD-10-CM | POA: Diagnosis not present

## 2017-08-11 DIAGNOSIS — M1990 Unspecified osteoarthritis, unspecified site: Secondary | ICD-10-CM | POA: Diagnosis not present

## 2017-08-11 DIAGNOSIS — I2510 Atherosclerotic heart disease of native coronary artery without angina pectoris: Secondary | ICD-10-CM | POA: Diagnosis not present

## 2017-08-17 DIAGNOSIS — I10 Essential (primary) hypertension: Secondary | ICD-10-CM | POA: Diagnosis not present

## 2017-08-17 DIAGNOSIS — R7303 Prediabetes: Secondary | ICD-10-CM | POA: Diagnosis not present

## 2017-08-17 DIAGNOSIS — I2510 Atherosclerotic heart disease of native coronary artery without angina pectoris: Secondary | ICD-10-CM | POA: Diagnosis not present

## 2017-08-17 DIAGNOSIS — E785 Hyperlipidemia, unspecified: Secondary | ICD-10-CM | POA: Diagnosis not present

## 2017-09-29 DIAGNOSIS — I255 Ischemic cardiomyopathy: Secondary | ICD-10-CM | POA: Diagnosis not present

## 2017-09-29 DIAGNOSIS — R0609 Other forms of dyspnea: Secondary | ICD-10-CM | POA: Diagnosis not present

## 2017-09-29 DIAGNOSIS — I2510 Atherosclerotic heart disease of native coronary artery without angina pectoris: Secondary | ICD-10-CM | POA: Diagnosis not present

## 2017-09-29 DIAGNOSIS — I10 Essential (primary) hypertension: Secondary | ICD-10-CM | POA: Diagnosis not present

## 2017-09-29 DIAGNOSIS — E785 Hyperlipidemia, unspecified: Secondary | ICD-10-CM | POA: Diagnosis not present

## 2017-09-29 DIAGNOSIS — R7309 Other abnormal glucose: Secondary | ICD-10-CM | POA: Diagnosis not present

## 2017-09-29 DIAGNOSIS — I252 Old myocardial infarction: Secondary | ICD-10-CM | POA: Diagnosis not present

## 2017-10-14 DIAGNOSIS — Z86010 Personal history of colonic polyps: Secondary | ICD-10-CM | POA: Diagnosis not present

## 2017-10-14 DIAGNOSIS — Z800 Family history of malignant neoplasm of digestive organs: Secondary | ICD-10-CM | POA: Diagnosis not present

## 2017-11-01 DIAGNOSIS — K5730 Diverticulosis of large intestine without perforation or abscess without bleeding: Secondary | ICD-10-CM | POA: Diagnosis not present

## 2017-11-01 DIAGNOSIS — K640 First degree hemorrhoids: Secondary | ICD-10-CM | POA: Diagnosis not present

## 2017-11-01 DIAGNOSIS — Z86010 Personal history of colonic polyps: Secondary | ICD-10-CM | POA: Diagnosis not present

## 2017-11-10 DIAGNOSIS — M1990 Unspecified osteoarthritis, unspecified site: Secondary | ICD-10-CM | POA: Diagnosis not present

## 2017-11-10 DIAGNOSIS — I2510 Atherosclerotic heart disease of native coronary artery without angina pectoris: Secondary | ICD-10-CM | POA: Diagnosis not present

## 2017-11-10 DIAGNOSIS — Z125 Encounter for screening for malignant neoplasm of prostate: Secondary | ICD-10-CM | POA: Diagnosis not present

## 2017-11-10 DIAGNOSIS — R490 Dysphonia: Secondary | ICD-10-CM | POA: Diagnosis not present

## 2017-11-10 DIAGNOSIS — Z0000 Encounter for general adult medical examination without abnormal findings: Secondary | ICD-10-CM | POA: Diagnosis not present

## 2017-11-10 DIAGNOSIS — N5089 Other specified disorders of the male genital organs: Secondary | ICD-10-CM | POA: Diagnosis not present

## 2017-11-10 DIAGNOSIS — E785 Hyperlipidemia, unspecified: Secondary | ICD-10-CM | POA: Diagnosis not present

## 2017-11-10 DIAGNOSIS — I252 Old myocardial infarction: Secondary | ICD-10-CM | POA: Diagnosis not present

## 2017-11-10 DIAGNOSIS — I255 Ischemic cardiomyopathy: Secondary | ICD-10-CM | POA: Diagnosis not present

## 2017-11-10 DIAGNOSIS — R7303 Prediabetes: Secondary | ICD-10-CM | POA: Diagnosis not present

## 2017-11-10 DIAGNOSIS — I10 Essential (primary) hypertension: Secondary | ICD-10-CM | POA: Diagnosis not present

## 2017-11-10 DIAGNOSIS — Z23 Encounter for immunization: Secondary | ICD-10-CM | POA: Diagnosis not present

## 2017-11-10 DIAGNOSIS — Z1389 Encounter for screening for other disorder: Secondary | ICD-10-CM | POA: Diagnosis not present

## 2017-11-10 DIAGNOSIS — Z1331 Encounter for screening for depression: Secondary | ICD-10-CM | POA: Diagnosis not present

## 2017-11-22 DIAGNOSIS — J383 Other diseases of vocal cords: Secondary | ICD-10-CM | POA: Diagnosis not present

## 2017-11-22 DIAGNOSIS — J343 Hypertrophy of nasal turbinates: Secondary | ICD-10-CM | POA: Diagnosis not present

## 2017-11-22 DIAGNOSIS — Z974 Presence of external hearing-aid: Secondary | ICD-10-CM | POA: Diagnosis not present

## 2017-11-22 DIAGNOSIS — Z7289 Other problems related to lifestyle: Secondary | ICD-10-CM | POA: Diagnosis not present

## 2017-11-24 ENCOUNTER — Other Ambulatory Visit: Payer: Self-pay | Admitting: Otolaryngology

## 2017-11-25 ENCOUNTER — Encounter (HOSPITAL_COMMUNITY): Payer: Self-pay | Admitting: *Deleted

## 2017-11-25 ENCOUNTER — Other Ambulatory Visit: Payer: Self-pay

## 2017-11-25 NOTE — Progress Notes (Addendum)
Cell phone number listed for patient is this daughter's Darlene Bartelt.   Ms Helzer said that patient cant talk to me, not even to give me permission to talk with her, "he will not be around today and you know he is having throat surgery. I took health information from Ms Cookston and gave her instructions. Ms Georgian Co is not aware of patient expericing chest and does not have shortness of breath at rest. I requested records from Dr Terrence Dupont and gave the chart to  Anesthesia PA's to review.

## 2017-11-25 NOTE — Progress Notes (Signed)
Anesthesia Chart Review: SAME DAY WORK-UP  Case:  [id number] Date/Time:  11/26/17 1125   Procedure:  MICROLARYNGOSCOPY WITH CO2 LASER AND EXCISION OF VOCAL CORD LESION WITH JET VENTILATION (N/A )   Anesthesia type:  General   Pre-op diagnosis:  vocal cord lesion   Location:  [facility] OR ROOM 09 / [facility]   Surgeon:  Melida Quitter, MD      DISCUSSION: Patient is a 77 year old male scheduled for the above procedure.   History includes CAD (anterolateral STEMI 10/15/14 s/p DES D2 and pLAD), ischemic cardiomyopathy, HTN, HLD, pre-diabetes, obesity.   He saw his cardiologist Dr. Terrence Dupont earlier this month and CAD felt stable. Echo this year showed stable EF ~ 45-59%. Stress test in 11/2016 was high risk due to large infarct and EF 44%, but NO ISCHEMIA. He is on ASA 81 mg daily, b-blocker, ARB, and statin therapy, but is now off [place].   He will need updated labs and EKG on the day of surgery, as he is a same day work-up. If no acute changes and same day testing results are acceptable then I would anticipate that he can proceed as planned, but anesthesiologist to evaluate on the day of surgery.    PROVIDERS: [name], MD is PCP Charolette Forward, MD is cardiologist ([facility]). Last visit 11/10/17. CAD felt to be controlled at that time. He was aware of ongoing ENT evaluation for hoarseness.    LABS: He is for labs on the day of surgery.   Transnasal fiberoptic laryngoscopy 11/22/17 [facility] Care Everywhere): Findings included normal nasal passages, no mass or abnormality in the nasopharynx, and no mass or ulceration in the pharynx or larynx. Pyriform sinuses are open. Secretions are minimal. There are two prominent white lesions on the left vocal fold and a bump on each vocal fold across from one another. The vocal folds adduct and abduct symmetrically. Glottal closure is impacted by the lesions. Muscle tension patterns are not present. Laryngeal edema is  minimal.   EKG: Last EKG received from Dr. Terrence Dupont was from 11/11/16, so he will need an updated EKG on the day of surgery.    CV: Echo 09/29/17 ([facility]): Summary: Normal left ventricular size.  Left ventricular systolic function is mildly depressed with abnormal ejection fraction.  Estimated EF 45 to 49%.  The left ventricle shows abnormal contractility with hypokinesis involving the anteroseptal wall.  Left ventricular hypertrophy is seen with normal diastolic function.  Trace pulmonic regurgitation.  Trace tricuspid regurgitation.  No intracardiac shunting is found.  No intracardiac masses or thrombi are seen.  No pericardial effusion.  Nuclear stress test 11/30/16: IMPRESSION: 1. Large severe infarction in the apical, mid, and basilar segments of the anterior septal wall and septal wall. No evidence of reversible ischemia. 2. LEFT ventricular dilatation which is greater on stress than rest. 3. Left ventricular ejection fraction 44% 4. Non invasive risk stratification*: High *2012 Appropriate Use Criteria for Coronary Revascularization Focused Update: J Am Coll Cardiol. 2012;59(9):857-881. Http://content.airportbarriers.com.aspx?articleid=1201161  Cardiac cath 10/15/14: Findings  LV showed anterolateral wall severe hypokinesis EF approximately 45%  Left main was patent  LAD was 100% occluded after giving off first diagonal with TIMI 0 flow  Ramus was small which was patent  Left circumflex was large which was patent OM1 and OM 2 with a very small which are patent OM1 3 was moderate size which was patent RCA was small which was patent patient has oh dominant coronary system  Ost 2nd Diag lesion, 90% stenosed. There is a 0% residual stenosis post intervention.  A drug-eluting stent was placed.  Prox LAD lesion, 100% stenosed. There is a 0% residual stenosis post intervention.  A drug-eluting stent was placed.   Past Medical History:  Diagnosis Date  .  Arthritis   . Coronary artery disease   . HOH (hard of hearing)   . Hyperlipidemia   . Hypertension   . Ischemic cardiomyopathy   . Myocardial infarction ([place]) 2016  . Quadriceps tendon rupture    LEFT    Past Surgical History:  Procedure Laterality Date  . CARDIAC CATHETERIZATION N/A 10/15/2014   Procedure: Left Heart Cath and Coronary Angiography;  Surgeon: Charolette Forward, MD;  Location: [place] [facility];  Service: Cardiovascular;  Laterality: N/A;  . COLONOSCOPY    . QUADRICEPS TENDON REPAIR Left 02/20/2014   Procedure: LEFT REPAIR QUADRICEP TENDON;  Surgeon: Mauri Pole, MD;  Location: [facility];  Service: Orthopedics;  Laterality: Left;  . THROAT SURGERY  2010    MEDICATIONS: No current facility-administered medications for this encounter.    Marland Kitchen aspirin EC 81 MG tablet  . atorvastatin (LIPITOR) 40 MG tablet  . carvedilol (COREG) 6.25 MG tablet  . cholecalciferol (VITAMIN D) 1000 UNITS tablet  . losartan (COZAAR) 25 MG tablet  . Multiple Vitamin (MULTIVITAMIN WITH MINERALS) TABS tablet  . nitroGLYCERIN (NITROSTAT) 0.4 MG SL tablet  . Polyethyl Glycol-Propyl Glycol (SYSTANE OP)  . white petrolatum (VASELINE) GEL  . ramipril (ALTACE) 1.25 MG capsule  . ticagrelor (BRILINTA) 90 MG TABS tablet  He is no longer on Brilinta. Message left for [facility] at Dr. Redmond Baseman' office regarding patient being on ASA 81 mg. Defer perioperative instructions to surgeon.   George Hugh [facility]/Anesthesiology Phone [phone] 11/25/2017 11:56 AM

## 2017-11-26 ENCOUNTER — Ambulatory Visit (HOSPITAL_COMMUNITY)
Admission: RE | Admit: 2017-11-26 | Discharge: 2017-11-26 | Disposition: A | Discharge status: Home / Self Care | Payer: Medicare HMO | Source: Ambulatory Visit | Attending: Otolaryngology | Admitting: Otolaryngology

## 2017-11-26 ENCOUNTER — Encounter (HOSPITAL_COMMUNITY)
Admission: RE | Disposition: A | Discharge status: Home / Self Care | Payer: Self-pay | Source: Ambulatory Visit | Attending: Otolaryngology

## 2017-11-26 ENCOUNTER — Ambulatory Visit (HOSPITAL_COMMUNITY): Payer: Medicare HMO | Admitting: Vascular Surgery

## 2017-11-26 ENCOUNTER — Encounter (HOSPITAL_COMMUNITY): Payer: Self-pay | Admitting: Surgery

## 2017-11-26 DIAGNOSIS — I252 Old myocardial infarction: Secondary | ICD-10-CM | POA: Diagnosis not present

## 2017-11-26 DIAGNOSIS — E785 Hyperlipidemia, unspecified: Secondary | ICD-10-CM | POA: Insufficient documentation

## 2017-11-26 DIAGNOSIS — J381 Polyp of vocal cord and larynx: Secondary | ICD-10-CM | POA: Insufficient documentation

## 2017-11-26 DIAGNOSIS — I10 Essential (primary) hypertension: Secondary | ICD-10-CM | POA: Insufficient documentation

## 2017-11-26 DIAGNOSIS — Z79899 Other long term (current) drug therapy: Secondary | ICD-10-CM | POA: Diagnosis not present

## 2017-11-26 DIAGNOSIS — Z7982 Long term (current) use of aspirin: Secondary | ICD-10-CM | POA: Insufficient documentation

## 2017-11-26 DIAGNOSIS — I2510 Atherosclerotic heart disease of native coronary artery without angina pectoris: Secondary | ICD-10-CM | POA: Diagnosis not present

## 2017-11-26 DIAGNOSIS — M1990 Unspecified osteoarthritis, unspecified site: Secondary | ICD-10-CM | POA: Diagnosis not present

## 2017-11-26 DIAGNOSIS — R490 Dysphonia: Secondary | ICD-10-CM | POA: Diagnosis not present

## 2017-11-26 DIAGNOSIS — J383 Other diseases of vocal cords: Secondary | ICD-10-CM | POA: Diagnosis not present

## 2017-11-26 HISTORY — DX: Acute myocardial infarction, unspecified: I21.9

## 2017-11-26 HISTORY — DX: Unspecified hearing loss, unspecified ear: H91.90

## 2017-11-26 HISTORY — DX: Atherosclerotic heart disease of native coronary artery without angina pectoris: I25.10

## 2017-11-26 HISTORY — PX: MICROLARYNGOSCOPY WITH CO2 LASER AND EXCISION OF VOCAL CORD LESION: SHX5970

## 2017-11-26 HISTORY — DX: Ischemic cardiomyopathy: I25.5

## 2017-11-26 LAB — BASIC METABOLIC PANEL
Anion gap: 4 — ABNORMAL LOW (ref 5–15)
BUN: 17 mg/dL (ref 8–23)
CO2: 27 mmol/L (ref 22–32)
Calcium: 8.7 mg/dL — ABNORMAL LOW (ref 8.9–10.3)
Chloride: 107 mmol/L (ref 98–111)
Creatinine, Ser: 1.3 mg/dL — ABNORMAL HIGH (ref 0.61–1.24)
GFR calc Af Amer: 59 mL/min — ABNORMAL LOW (ref >60)
GFR calc non Af Amer: 51 mL/min — ABNORMAL LOW (ref >60)
Glucose, Bld: 104 mg/dL — ABNORMAL HIGH (ref 70–99)
Potassium: 3.6 mmol/L (ref 3.5–5.1)
Sodium: 138 mmol/L (ref 135–145)

## 2017-11-26 LAB — HEMOGLOBIN: Hemoglobin: 13.6 g/dL (ref 13.0–17.0)

## 2017-11-26 SURGERY — MICROLARYNGOSCOPY WITH CO2 LASER AND EXCISION OF VOCAL CORD LESION
Anesthesia: General | Site: Throat

## 2017-11-26 MED ORDER — SODIUM CHLORIDE 0.9 % IV SOLN
INTRAVENOUS | Status: DC | PRN
  Administered 2017-11-26: 25 ug/min via INTRAVENOUS

## 2017-11-26 MED ORDER — 0.9 % SODIUM CHLORIDE (POUR BTL) OPTIME
TOPICAL | Status: DC | PRN
  Administered 2017-11-26: 1000 mL

## 2017-11-26 MED ORDER — PROPOFOL 10 MG/ML IV BOLUS
INTRAVENOUS | Status: DC | PRN
  Administered 2017-11-26: 125 mg via INTRAVENOUS

## 2017-11-26 MED ORDER — HYDROMORPHONE HCL 1 MG/ML IJ SOLN: 0.2500 mg | INTRAMUSCULAR | Status: DC | PRN

## 2017-11-26 MED ORDER — SUGAMMADEX SODIUM 200 MG/2ML IV SOLN
INTRAVENOUS | Status: DC | PRN
  Administered 2017-11-26: 200 mg via INTRAVENOUS

## 2017-11-26 MED ORDER — PROPOFOL 10 MG/ML IV BOLUS
INTRAVENOUS | Status: AC
  Filled 2017-11-26: qty 20

## 2017-11-26 MED ORDER — EPINEPHRINE HCL (NASAL) 0.1 % NA SOLN
NASAL | Status: AC
  Filled 2017-11-26: qty 30

## 2017-11-26 MED ORDER — LIDOCAINE 2% (20 MG/ML) 5 ML SYRINGE
INTRAMUSCULAR | Status: DC | PRN
  Administered 2017-11-26: 60 mg via INTRAVENOUS

## 2017-11-26 MED ORDER — DEXAMETHASONE SODIUM PHOSPHATE 10 MG/ML IJ SOLN
INTRAMUSCULAR | Status: DC | PRN
  Administered 2017-11-26: 10 mg via INTRAVENOUS

## 2017-11-26 MED ORDER — PROMETHAZINE HCL 25 MG/ML IJ SOLN: 6.2500 mg | INTRAMUSCULAR | Status: DC | PRN

## 2017-11-26 MED ORDER — OXYCODONE HCL 5 MG PO TABS: 5.0000 mg | ORAL_TABLET | Freq: Once | ORAL | Status: DC | PRN

## 2017-11-26 MED ORDER — LACTATED RINGERS IV SOLN
INTRAVENOUS | Status: DC
  Administered 2017-11-26: 12:00:00 via INTRAVENOUS

## 2017-11-26 MED ORDER — ONDANSETRON HCL 4 MG/2ML IJ SOLN
INTRAMUSCULAR | Status: DC | PRN
  Administered 2017-11-26: 4 mg via INTRAVENOUS

## 2017-11-26 MED ORDER — ROCURONIUM BROMIDE 10 MG/ML (PF) SYRINGE
PREFILLED_SYRINGE | INTRAVENOUS | Status: DC | PRN
  Administered 2017-11-26: 50 mg via INTRAVENOUS

## 2017-11-26 MED ORDER — OXYCODONE HCL 5 MG/5ML PO SOLN: 5.0000 mg | Freq: Once | ORAL | Status: DC | PRN

## 2017-11-26 MED ORDER — FENTANYL CITRATE (PF) 250 MCG/5ML IJ SOLN
INTRAMUSCULAR | Status: DC | PRN
  Administered 2017-11-26 (×2): 50 ug via INTRAVENOUS

## 2017-11-26 MED ORDER — PROPOFOL 500 MG/50ML IV EMUL
INTRAVENOUS | Status: DC | PRN
  Administered 2017-11-26: 100 ug/kg/min via INTRAVENOUS

## 2017-11-26 MED ORDER — FENTANYL CITRATE (PF) 250 MCG/5ML IJ SOLN
INTRAMUSCULAR | Status: AC
  Filled 2017-11-26: qty 5

## 2017-11-26 MED ORDER — EPINEPHRINE PF 1 MG/ML IJ SOLN
INTRAMUSCULAR | Status: DC | PRN
  Administered 2017-11-26: 30 mL

## 2017-11-26 SURGICAL SUPPLY — 31 items
BALLN PULM 12 13.5 15X75 (BALLOONS)
BALLN PULMONARY 10-12 (MISCELLANEOUS) IMPLANT
BALLOON PULM 12 13.5 15X75 (BALLOONS) IMPLANT
BANDAGE EYE OVAL (MISCELLANEOUS) ×4 IMPLANT
BLADE SURG 15 STRL LF DISP TIS (BLADE) IMPLANT
BLADE SURG 15 STRL SS (BLADE)
CANISTER SUCT 3000ML PPV (MISCELLANEOUS) ×2 IMPLANT
CONT SPEC 4OZ CLIKSEAL STRL BL (MISCELLANEOUS) IMPLANT
COVER BACK TABLE 60X90IN (DRAPES) ×2 IMPLANT
COVER MAYO STAND STRL (DRAPES) ×2 IMPLANT
COVER WAND RF STERILE (DRAPES) IMPLANT
CRADLE DONUT ADULT HEAD (MISCELLANEOUS) ×2 IMPLANT
DRAPE HALF SHEET 40X57 (DRAPES) ×2 IMPLANT
DRSG TELFA 3X8 NADH (GAUZE/BANDAGES/DRESSINGS) ×2 IMPLANT
GAUZE SPONGE 4X4 12PLY STRL (GAUZE/BANDAGES/DRESSINGS) ×2 IMPLANT
GLOVE BIO SURGEON STRL SZ7.5 (GLOVE) ×2 IMPLANT
GOWN STRL REUS W/ TWL LRG LVL3 (GOWN DISPOSABLE) IMPLANT
GOWN STRL REUS W/TWL LRG LVL3 (GOWN DISPOSABLE)
KIT BASIN OR (CUSTOM PROCEDURE TRAY) ×2 IMPLANT
KIT TURNOVER KIT B (KITS) ×2 IMPLANT
NEEDLE HYPO 25GX1X1/2 BEV (NEEDLE) IMPLANT
NS IRRIG 1000ML POUR BTL (IV SOLUTION) ×2 IMPLANT
PAD ARMBOARD 7.5X6 YLW CONV (MISCELLANEOUS) ×4 IMPLANT
PATTIES SURGICAL .5 X1 (DISPOSABLE) ×2 IMPLANT
PATTIES SURGICAL .5 X3 (DISPOSABLE) ×2 IMPLANT
SOLUTION ANTI FOG 6CC (MISCELLANEOUS) ×2 IMPLANT
SURGILUBE 2OZ TUBE FLIPTOP (MISCELLANEOUS) IMPLANT
SUT SILK 2 0 PERMA HAND 18 BK (SUTURE) IMPLANT
TOWEL NATURAL 6PK STERILE (DISPOSABLE) ×2 IMPLANT
TUBE CONNECTING 12X1/4 (SUCTIONS) ×2 IMPLANT
WATER STERILE IRR 1000ML POUR (IV SOLUTION) ×2 IMPLANT

## 2017-11-26 NOTE — Anesthesia Procedure Notes (Signed)
Procedure Name: General with mask airway Date/Time: 11/26/2017 12:00 PM Performed by: Verdie Drown, CRNA Pre-anesthesia Checklist: Patient identified, Emergency Drugs available, Suction available, Patient being monitored and Timeout performed Patient Re-evaluated:Patient Re-evaluated prior to induction Oxygen Delivery Method: Circle system utilized Preoxygenation: Pre-oxygenation with 100% oxygen Ventilation: Oral airway inserted - appropriate to patient size Comments: Induction followed by mask ventilation by CRNA. Airway handed to Dr. Redmond Baseman for surgery- continuing to mask vent with oral airway and set up for jet ventilation.

## 2017-11-26 NOTE — Transfer of Care (Signed)
Immediate Anesthesia Transfer of Care Note  Patient: Carl [name]  Procedure(s) Performed: MICROLARYNGOSCOPY WITH CO2 LASER AND EXCISION OF VOCAL CORD LESION WITH JET VENTILATION (N/A Throat)  Patient Location: PACU  Anesthesia Type:General  Level of Consciousness: drowsy and patient cooperative  Airway & Oxygen Therapy: Patient Spontanous Breathing and Patient connected to face mask oxygen  Post-op Assessment: Report given to RN, Post -op Vital signs reviewed and stable and Patient moving all extremities  Post vital signs: Reviewed and stable  Last Vitals:  Vitals Value Taken Time  BP 110/68 11/26/2017 12:44 PM  Temp 36.4 C 11/26/2017 12:44 PM  Pulse 77 11/26/2017 12:48 PM  Resp 13 11/26/2017 12:48 PM  SpO2 98 % 11/26/2017 12:48 PM  Vitals shown include unvalidated device data.  Last Pain:  Vitals:   11/26/17 1244  TempSrc:   PainSc: Asleep      Patients Stated Pain Goal: 2 (42/59/56 3875)  Complications: No apparent anesthesia complications

## 2017-11-26 NOTE — Anesthesia Preprocedure Evaluation (Addendum)
Anesthesia Evaluation  Patient identified by MRN, date of birth, ID band Patient awake    Reviewed: Allergy & Precautions, NPO status , Patient's Chart, lab work & pertinent test results  Airway Mallampati: II  TM Distance: >3 FB Neck ROM: Full   Comment: Hoarseness chronically.  Had vocal cord procedure in 4562 with Dr. Erik Obey. Dental no notable dental hx.    Pulmonary neg pulmonary ROS,    Pulmonary exam normal breath sounds clear to auscultation       Cardiovascular hypertension, Pt. on medications + CAD  Normal cardiovascular exam Rhythm:Regular Rate:Normal     Neuro/Psych negative neurological ROS  negative psych ROS   GI/Hepatic negative GI ROS, Neg liver ROS,   Endo/Other  negative endocrine ROS  Renal/GU negative Renal ROS  negative genitourinary   Musculoskeletal  (+) Arthritis ,   Abdominal (+) - obese,   Peds negative pediatric ROS (+)  Hematology negative hematology ROS (+)   Anesthesia Other Findings   Reproductive/Obstetrics negative OB ROS                             Anesthesia Physical  Anesthesia Plan  ASA: III  Anesthesia Plan: General   Post-op Pain Management:    Induction: Intravenous  PONV Risk Score and Plan: 2 and Ondansetron and Midazolam  Airway Management Planned: Oral ETT and Mask  Additional Equipment:   Intra-op Plan:   Post-operative Plan: Extubation in OR  Informed Consent: I have reviewed the patients History and Physical, chart, labs and discussed the procedure including the risks, benefits and alternatives for the proposed anesthesia with the patient or authorized representative who has indicated his/her understanding and acceptance.   Dental advisory given  Plan Discussed with: CRNA  Anesthesia Plan Comments:        Anesthesia Quick Evaluation

## 2017-11-26 NOTE — Op Note (Signed)
NAME: Carl [name], Carl [name] MEDICAL RECORD EY:[id number] ACCOUNT [id number] DATE OF BIRTH:12-09-1940 FACILITY: [facility] LOCATION: [facility] PHYSICIAN:Neftaly Inzunza Guido Sander, MD  OPERATIVE REPORT  DATE OF PROCEDURE:  11/26/2017  PREOPERATIVE DIAGNOSES: 1.  Dysphonia. 2.  Vocal fold lesions.  POSTOPERATIVE DIAGNOSES: 1.  Dysphonia. 2.  Vocal fold lesions.  PROCEDURE:  Suspended microdirect laryngoscopy with CO2 laser excision of vocal cord lesions.  SURGEON:  Melida Quitter, MD  ANESTHESIA:  General jet Venturi ventilation.  COMPLICATIONS:  None.  INDICATIONS:  The patient is a 77 year old male with a long history of dysphonia who previously underwent a laryngeal procedure years ago but did not notice a great deal of improvement in his voice at the time.  At a recent office visit, he was noted to  have 2 white lesions on the left vocal fold and bumps on each vocal fold.  He presents to the operating room for surgical management.  FINDINGS:  There were 2 prominent white patches on the left vocal fold, 1 at the mid cord and 1 at the anterior cord.  There was a round submucosal bump or cyst on each vocal fold at the anterior cord across from each other.  All 4 lesions were sent for  pathology.  DESCRIPTION OF PROCEDURE:  The patient was identified in the holding room, and informed consent having been obtained including discussion of risks, benefits and alternatives, the patient was brought to the operative suite and put on the operative table  in supine position.  Anesthesia was induced, and the patient was maintained via mask ventilation.  The eyes were taped closed and the bed was turned 90 degrees from anesthesia.  The patient was given intravenous steroids during the case.  A damp gauze  was placed over the upper gum, and a Stortz laryngoscope was inserted and placed in the glottic position with the assistance of cup forceps to remove the epiglottis.  The laryngoscope was placed in suspension on  the Mayo stand, and jet Venturi  ventilation was initiated.  A 0 degree telescope was used to make a preoperative photograph and examine the larynx.  Damp eye pads were taped over the eyes and a damp towel was placed over the face.  Under the operating microscope, CO2 laser was then  used to make an incision just lateral to the mid cord lesion on the left while it was being retracted medially.  This incision extended deep to the lesion until the lesion was removed.  This was passed to nursing for pathology.  The same was then done  with the left anterior lesion.  The left anterior cyst was likewise grasped with cup forceps and retracted medially with a lateral incision made laterally with our lateral incision made with a laser and extended deep and around the cyst until it was  removed.  This was passed to nursing for pathology.  Finally, the same thing was done to the right-sided vocal cord cyst.  After this was completed, epinephrine pledget was rubbed against the larynx to remove char and help with bleeding.  The 0-degree  telescope was then used to make a postoperative photograph.  The vocal cords were sprayed with 2% lidocaine, and the laryngoscope was then taken out of suspension and removed from the patient's mouth as the airway was suctioned.  He was turned back to  anesthesia for wakeup and was moved to the recovery room in stable condition.  LN/NUANCE  D:11/26/2017 T:11/26/2017 JOB:[id number]

## 2017-11-26 NOTE — Anesthesia Postprocedure Evaluation (Signed)
Anesthesia Post Note  Patient: Carl [name]  Procedure(s) Performed: MICROLARYNGOSCOPY WITH CO2 LASER AND EXCISION OF VOCAL CORD LESION WITH JET VENTILATION (N/A Throat)     Patient location during evaluation: PACU Anesthesia Type: General Level of consciousness: awake and alert Pain management: pain level controlled Vital Signs Assessment: post-procedure vital signs reviewed and stable Respiratory status: spontaneous breathing, nonlabored ventilation and respiratory function stable Cardiovascular status: blood pressure returned to baseline and stable Postop Assessment: no apparent nausea or vomiting Anesthetic complications: no    Last Vitals:  Vitals:   11/26/17 1344 11/26/17 1400  BP: 128/85 133/81  Pulse: 65 75  Resp: 15 12  Temp:  (!) 36.2 C  SpO2: 97% 99%    Last Pain:  Vitals:   11/26/17 1259  TempSrc:   PainSc: 0-No pain                 Lynda Rainwater

## 2017-11-26 NOTE — Brief Op Note (Signed)
11/26/2017  12:30 PM  PATIENT:  Carl [name]  77 y.o. male  PRE-OPERATIVE DIAGNOSIS:  vocal cord lesion  POST-OPERATIVE DIAGNOSIS:  vocal cord lesion  PROCEDURE:  Procedure(s): MICROLARYNGOSCOPY WITH CO2 LASER AND EXCISION OF VOCAL CORD LESION WITH JET VENTILATION (N/A)  SURGEON:  Surgeon(s) and Role:    Melida Quitter, MD - Primary  PHYSICIAN ASSISTANT:   ASSISTANTS: none   ANESTHESIA:   general  EBL:  Minimal  BLOOD ADMINISTERED:none  DRAINS: none   LOCAL MEDICATIONS USED:  NONE  SPECIMEN:  Source of Specimen:  Left anterior and mid cord lesions, left and right cord cysts  DISPOSITION OF SPECIMEN:  PATHOLOGY  COUNTS:  YES  TOURNIQUET:  * No tourniquets in log *  DICTATION: .Other Dictation: Dictation Number [id number]  PLAN OF CARE: Discharge to home after PACU  PATIENT DISPOSITION:  PACU - hemodynamically stable.   [name] start of Pharmacological VTE agent (>24hrs) due to surgical blood loss or risk of bleeding: no

## 2017-11-26 NOTE — H&P (Signed)
Carl [name] is an 77 y.o. male.   Chief Complaint: Dysphonia, vocal fold lesion HPI: 77 year old male with long history of dysphonia found to have multiple vocal fold lesions.  He previously underwent a laryngeal surgery but does not recall his voice improving.  Past Medical History:  Diagnosis Date  . Arthritis   . Coronary artery disease   . HOH (hard of hearing)   . Hyperlipidemia   . Hypertension   . Ischemic cardiomyopathy   . Myocardial infarction ([place]) 2016  . Quadriceps tendon rupture    LEFT    Past Surgical History:  Procedure Laterality Date  . CARDIAC CATHETERIZATION N/A 10/15/2014   Procedure: Left Heart Cath and Coronary Angiography;  Surgeon: Charolette Forward, MD;  Location: [place] [facility];  Service: Cardiovascular;  Laterality: N/A;  . COLONOSCOPY    . QUADRICEPS TENDON REPAIR Left 02/20/2014   Procedure: LEFT REPAIR QUADRICEP TENDON;  Surgeon: Mauri Pole, MD;  Location: [facility];  Service: Orthopedics;  Laterality: Left;  . THROAT SURGERY  2010    History reviewed. No pertinent family history. Social History:  reports that he has never smoked. He has never used smokeless tobacco. He reports that he drinks alcohol. He reports that he does not use drugs.  Allergies: No Known Allergies  Medications Prior to Admission  Medication Sig Dispense Refill  . aspirin EC 81 MG tablet Take 81 mg by mouth daily.    Marland Kitchen atorvastatin (LIPITOR) 40 MG tablet Take 1 tablet (40 mg total) by mouth daily at 6 PM. 60 tablet 3  . carvedilol (COREG) 6.25 MG tablet Take 6.25 mg by mouth 2 (two) times daily with a meal.    . cholecalciferol (VITAMIN D) 1000 UNITS tablet Take 1,000 Units by mouth daily.    Marland Kitchen losartan (COZAAR) 25 MG tablet Take 25 mg by mouth every evening.    . Multiple Vitamin (MULTIVITAMIN WITH MINERALS) TABS tablet Take 1 tablet by mouth daily.    Vladimir Faster Glycol-Propyl Glycol (SYSTANE OP) Place 1 drop into both eyes at bedtime.     . white petrolatum  (VASELINE) GEL Apply 1 application topically as needed for dry skin (itching).    . nitroGLYCERIN (NITROSTAT) 0.4 MG SL tablet Place 1 tablet (0.4 mg total) under the tongue every 5 (five) minutes x 3 doses as needed for chest pain. 25 tablet 12  . ramipril (ALTACE) 1.25 MG capsule Take 1 capsule (1.25 mg total) by mouth daily. (Patient not taking: Reported on 11/25/2017) 30 capsule 3  . ticagrelor (BRILINTA) 90 MG TABS tablet Take 1 tablet (90 mg total) by mouth 2 (two) times daily. (Patient not taking: Reported on 11/25/2017) 60 tablet 3    Results for orders placed or performed during the hospital encounter of 11/26/17 (from the past 48 hour(s))  Basic metabolic panel     Status: Abnormal   Collection Time: 11/26/17  9:10 AM  Result Value Ref Range   Sodium 138 135 - 145 mmol/L   Potassium 3.6 3.5 - 5.1 mmol/L   Chloride 107 98 - 111 mmol/L   CO2 27 22 - 32 mmol/L   Glucose, Bld 104 (H) 70 - 99 mg/dL   BUN 17 8 - 23 mg/dL   Creatinine, Ser 1.30 (H) 0.61 - 1.24 mg/dL   Calcium 8.7 (L) 8.9 - 10.3 mg/dL   GFR calc non Af Amer 51 (L) >60 mL/min   GFR calc Af Amer 59 (L) >60 mL/min  Comment: (NOTE) The eGFR has been calculated using the CKD EPI equation. This calculation has not been validated in all clinical situations. eGFR's persistently <60 mL/min signify possible Chronic Kidney Disease.    Anion gap 4 (L) 5 - 15    Comment: Performed at [facility] [address], [place], [place] [address]  Hemoglobin     Status: None   Collection Time: 11/26/17  9:10 AM  Result Value Ref Range   Hemoglobin 13.6 13.0 - 17.0 g/dL    Comment: Performed at [facility] [address], [place], [place] [address]   No results found.  Review of Systems  All other systems reviewed and are negative.   Blood pressure (!) 144/85, pulse 70, temperature 97.6 F (36.4 C), temperature source Oral, resp. rate 20, height _0  (1.854 m), weight 102.1 kg, SpO2 98 %. Physical Exam   Constitutional: He is oriented to person, place, and time. He appears well-developed and well-nourished. No distress.  HENT:  Head: Normocephalic and atraumatic.  Right Ear: External ear normal.  Left Ear: External ear normal.  Nose: Nose normal.  Mouth/Throat: Oropharynx is clear and moist.  Severely hoarse, harsh tone.  Eyes: Pupils are equal, round, and reactive to light. Conjunctivae and EOM are normal.  Neck: Normal range of motion. Neck supple.  Cardiovascular: Normal rate.  Respiratory: Effort normal.  Musculoskeletal: Normal range of motion.  Neurological: He is alert and oriented to person, place, and time. No cranial nerve deficit.  Skin: Skin is warm and dry.  Psychiatric: He has a normal mood and affect. His behavior is normal. Judgment and thought content normal.     Assessment/Plan Dysphonia, vocal fold lesions  To OR for SMDL with CO2 laser excision.  Melida Quitter, MD 11/26/2017, 11:29 AM

## 2017-11-27 ENCOUNTER — Encounter (HOSPITAL_COMMUNITY): Payer: Self-pay | Admitting: Otolaryngology

## 2017-12-02 ENCOUNTER — Other Ambulatory Visit: Payer: Self-pay | Admitting: Urology

## 2017-12-02 ENCOUNTER — Other Ambulatory Visit: Payer: Self-pay | Admitting: Family Medicine

## 2017-12-02 DIAGNOSIS — D4012 Neoplasm of uncertain behavior of left testis: Secondary | ICD-10-CM | POA: Diagnosis not present

## 2017-12-02 DIAGNOSIS — C8330 Diffuse large B-cell lymphoma, unspecified site: Secondary | ICD-10-CM | POA: Diagnosis not present

## 2017-12-02 DIAGNOSIS — N400 Enlarged prostate without lower urinary tract symptoms: Secondary | ICD-10-CM | POA: Diagnosis not present

## 2017-12-06 ENCOUNTER — Other Ambulatory Visit: Payer: Self-pay | Admitting: Urology

## 2017-12-06 DIAGNOSIS — D4012 Neoplasm of uncertain behavior of left testis: Secondary | ICD-10-CM

## 2017-12-08 ENCOUNTER — Other Ambulatory Visit: Payer: Medicare HMO

## 2017-12-08 ENCOUNTER — Ambulatory Visit
Admission: RE | Admit: 2017-12-08 | Discharge: 2017-12-08 | Disposition: A | Discharge status: Home / Self Care | Payer: Medicare HMO | Source: Ambulatory Visit | Attending: Urology | Admitting: Urology

## 2017-12-08 DIAGNOSIS — D4012 Neoplasm of uncertain behavior of left testis: Secondary | ICD-10-CM

## 2017-12-08 DIAGNOSIS — I861 Scrotal varices: Secondary | ICD-10-CM | POA: Diagnosis not present

## 2017-12-10 DIAGNOSIS — R490 Dysphonia: Secondary | ICD-10-CM | POA: Diagnosis not present

## 2017-12-10 DIAGNOSIS — J383 Other diseases of vocal cords: Secondary | ICD-10-CM | POA: Diagnosis not present

## 2017-12-17 DIAGNOSIS — R197 Diarrhea, unspecified: Secondary | ICD-10-CM | POA: Diagnosis not present

## 2017-12-28 DIAGNOSIS — D4012 Neoplasm of uncertain behavior of left testis: Secondary | ICD-10-CM | POA: Diagnosis not present

## 2018-01-05 ENCOUNTER — Other Ambulatory Visit: Payer: Self-pay | Admitting: Ophthalmology

## 2018-01-05 DIAGNOSIS — D4012 Neoplasm of uncertain behavior of left testis: Secondary | ICD-10-CM | POA: Diagnosis not present

## 2018-01-05 DIAGNOSIS — C8339 Diffuse large B-cell lymphoma, extranodal and solid organ sites: Secondary | ICD-10-CM | POA: Diagnosis not present

## 2018-01-05 DIAGNOSIS — N5089 Other specified disorders of the male genital organs: Secondary | ICD-10-CM | POA: Diagnosis not present

## 2018-01-18 DIAGNOSIS — C8330 Diffuse large B-cell lymphoma, unspecified site: Secondary | ICD-10-CM | POA: Diagnosis not present

## 2018-01-20 DIAGNOSIS — J383 Other diseases of vocal cords: Secondary | ICD-10-CM | POA: Diagnosis not present

## 2018-01-20 DIAGNOSIS — R490 Dysphonia: Secondary | ICD-10-CM | POA: Diagnosis not present

## 2018-01-24 ENCOUNTER — Telehealth: Payer: Self-pay | Admitting: Hematology

## 2018-01-24 NOTE — Progress Notes (Signed)
HEMATOLOGY/ONCOLOGY CONSULTATION NOTE  Date of Service: 01/25/2018  Patient Care Team: [name], MD as PCP - General (Family Medicine)  CHIEF COMPLAINTS/PURPOSE OF CONSULTATION:  Diffuse Large B-Cell Lymphoma   HISTORY OF PRESENTING ILLNESS:   Carl [name] is a wonderful 77 y.o. male who has been referred to [place] by Dr. [name] for evaluation and management of Diffuse Large B-Cell Lymphoma. The pt reports that he is doing well overall.  The pt notes that he first began feeling differently about 4-5 months ago, when he noticed left testicular swelling. He notes that the his testicle was "a little bit larger than a golf ball," and that this was not painful. The pt notes that he has not had any abdominal distension or abdominal pain. The pt denies any fevers, chills, night sweats, or unexpected weight loss. He also denies any leg swelling. The pt denies any trauma sustained by the testicles. He denies noticing any other related symptoms.   The pt reports that he believed he had food poisoning prior to this, as he had lots of diarrhea. He notes that he has observed a small knot on the back of his right wrist, which has not changed over the last year.  The pt has two stents in his heart which were placed in 2016. The pt notes that his blood pressure has been stable and well-controlled. He notes that his last ECHO was 6 months ago, and he has not had any CP or SOB since that time. He takes 81mg  aspirin daily. He also takes Atorvastatin. The pt denies feeling limited in his walking by SOB or CP. He notes that he could walk 2 miles before ceasing to walk, noting that feeling tired in his muscles would limit him.   The pt has recently pursued surgery on his throat with ENT Dr. Melida Quitter for altered voice and hoarseness related to his vocal cords.   The pt notes that he currently lives by himself, and is not married. He has a daughter who is 67 years old. He notes that  his daughter "tries to help out." He notes that he does not have a power of attorney, but that this would be his daughter.   Of note prior to the patient's visit today, pt has had a Left Testis Surgical biopsy completed on 01/05/18 with results revealing Diffuse Large B-Cell Lymphoma.   The pt also had an US Scrotum on 12/08/17 which revealed Abnormal appearance of the left testicle which is larger than the right and diffusely heterogeneous in echotexture. Cannot exclude infiltrating process/mass. Appearance is concerning for possible neoplasm.  Most recent lab results (11/26/17) of BMP revealed all values WNL except for Glucose at 104, Creatinine at 1.30, Calcium at 8.7, GFR at 59 11/26/17 Hemoglobin value at 13.6.   On review of systems, pt reports stable energy levels, left testicular swelling, and denies fevers, chills, night sweats, unexpected weight loss, CP, SOB, back pains, flank pains, abdominal pains, leg swelling, testicular pain, and any other symptoms.   On PMHx the pt reports CAD, two heart stents placed in 2016. On Social Hx the pt denies smoking.  On Family Hx the pt reports mother with lymphoma of the breast. Two sisters with breast cancer, one at age 28.  MEDICAL HISTORY:  Past Medical History:  Diagnosis Date  . Arthritis   . Coronary artery disease   . HOH (hard of hearing)   . Hyperlipidemia   . Hypertension   .  Ischemic cardiomyopathy   . Myocardial infarction ([place]) 2016  . Quadriceps tendon rupture    LEFT    SURGICAL HISTORY: Past Surgical History:  Procedure Laterality Date  . CARDIAC CATHETERIZATION N/A 10/15/2014   Procedure: Left Heart Cath and Coronary Angiography;  Surgeon: Charolette Forward, MD;  Location: [facility];  Service: Cardiovascular;  Laterality: N/A;  . COLONOSCOPY    . MICROLARYNGOSCOPY WITH CO2 LASER AND EXCISION OF VOCAL CORD LESION N/A 11/26/2017   Procedure: MICROLARYNGOSCOPY WITH CO2 LASER AND EXCISION OF VOCAL CORD LESION WITH JET  VENTILATION;  Surgeon: Melida Quitter, MD;  Location: [facility];  Service: ENT;  Laterality: N/A;  . QUADRICEPS TENDON REPAIR Left 02/20/2014   Procedure: LEFT REPAIR QUADRICEP TENDON;  Surgeon: Mauri Pole, MD;  Location: [facility];  Service: Orthopedics;  Laterality: Left;  . THROAT SURGERY  2010    SOCIAL HISTORY: Social History   Socioeconomic History  . Marital status: Divorced    Spouse name: Not on file  . Number of children: Not on file  . Years of education: Not on file  . Highest education level: Not on file  Occupational History  . Not on file  Social Needs  . Financial resource strain: Not on file  . Food insecurity:    Worry: Not on file    Inability: Not on file  . Transportation needs:    Medical: Not on file    Non-medical: Not on file  Tobacco Use  . Smoking status: Never Smoker  . Smokeless tobacco: Never Used  Substance and Sexual Activity  . Alcohol use: Yes    Comment: OCCASIONAL  . Drug use: No  . Sexual activity: Not on file  Lifestyle  . Physical activity:    Days per week: Not on file    Minutes per session: Not on file  . Stress: Not on file  Relationships  . Social connections:    Talks on phone: Not on file    Gets together: Not on file    Attends religious service: Not on file    Active member of club or organization: Not on file    Attends meetings of clubs or organizations: Not on file    Relationship status: Not on file  . Intimate partner violence:    Fear of current or ex partner: Not on file    Emotionally abused: Not on file    Physically abused: Not on file    Forced sexual activity: Not on file  Other Topics Concern  . Not on file  Social History Narrative  . Not on file    FAMILY HISTORY: No family history on file.  ALLERGIES:  has No Known Allergies.  MEDICATIONS:  Current Outpatient Medications  Medication Sig Dispense Refill  . aspirin EC 81 MG tablet Take 81 mg by mouth daily.    Marland Kitchen atorvastatin (LIPITOR) 40 MG  tablet Take 1 tablet (40 mg total) by mouth daily at 6 PM. 60 tablet 3  . carvedilol (COREG) 6.25 MG tablet Take 6.25 mg by mouth 2 (two) times daily with a meal.    . cholecalciferol (VITAMIN D) 1000 UNITS tablet Take 1,000 Units by mouth daily.    Marland Kitchen losartan (COZAAR) 25 MG tablet Take 25 mg by mouth every evening.    . Multiple Vitamin (MULTIVITAMIN WITH MINERALS) TABS tablet Take 1 tablet by mouth daily.    . nitroGLYCERIN (NITROSTAT) 0.4 MG SL tablet Place 1 tablet (0.4 mg total) under the  tongue every 5 (five) minutes x 3 doses as needed for chest pain. 25 tablet 12  . Polyethyl Glycol-Propyl Glycol (SYSTANE OP) Place 1 drop into both eyes at bedtime.     . white petrolatum (VASELINE) GEL Apply 1 application topically as needed for dry skin (itching).     No current facility-administered medications for this visit.     REVIEW OF SYSTEMS:    10 Point review of Systems was done is negative except as noted above.  PHYSICAL EXAMINATION: ECOG PERFORMANCE STATUS: 2 - Symptomatic, <50% confined to bed  . Vitals:   01/25/18 1307  BP: 135/75  Pulse: 64  Resp: 18  Temp: 97.8 F (36.6 C)  SpO2: 99%   Filed Weights   01/25/18 1307  Weight: 234 lb (106.1 kg)   .Body mass index is 30.87 kg/m.  GENERAL:alert, in no acute distress and comfortable SKIN: no acute rashes, no significant lesions EYES: conjunctiva are pink and non-injected, sclera anicteric OROPHARYNX: MMM, no exudates, no oropharyngeal erythema or ulceration NECK: supple, no JVD LYMPH:  no palpable lymphadenopathy in the cervical, axillary or inguinal regions LUNGS: clear to auscultation b/l with normal respiratory effort HEART: regular rate & rhythm ABDOMEN:  normoactive bowel sounds , non tender, not distended. No palpable hepatosplenomegaly.  Extremity: no pedal edema PSYCH: alert & oriented x 3 with fluent speech NEURO: no focal motor/sensory deficits  LABORATORY DATA:  I have reviewed the data as  listed  . CBC Latest Ref Rng & Units 11/26/2017 10/19/2014 10/18/2014  WBC 4.0 - 10.5 K/uL - 7.9 9.4  Hemoglobin 13.0 - 17.0 g/dL 13.6 13.8 13.2  Hematocrit 39.0 - 52.0 % - 40.8 40.5  Platelets 150 - 400 K/uL - 182 175    . CMP Latest Ref Rng & Units 11/26/2017 10/19/2014 10/18/2014  Glucose 70 - 99 mg/dL 104(H) 105(H) 114(H)  BUN 8 - 23 mg/dL 17 14 12   Creatinine 0.61 - 1.24 mg/dL 1.30(H) 1.06 1.25(H)  Sodium 135 - 145 mmol/L 138 139 137  Potassium 3.5 - 5.1 mmol/L 3.6 3.7 4.0  Chloride 98 - 111 mmol/L 107 107 104  CO2 22 - 32 mmol/L 27 24 25   Calcium 8.9 - 10.3 mg/dL 8.7(L) 8.4(L) 8.3(L)  Total Protein 6.5 - 8.1 g/dL - - -  Total Bilirubin 0.3 - 1.2 mg/dL - - -  Alkaline Phos 38 - 126 U/L - - -  AST 15 - 41 U/L - - -  ALT 17 - 63 U/L - - -   01/05/18 Pathology:    RADIOGRAPHIC STUDIES: I have personally reviewed the radiological images as listed and agreed with the findings in the report. No results found.  ASSESSMENT & PLAN:   77 y.o. 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 Cannot exclude infiltrating process/mass. Appearance is concerning for possible neoplasm. -Discussed the 01/05/18 Left testes biopsy which revealed Primary Testicular Diffuse Large B-Cell Lymphoma  -Reviewed the 11/30/16 NM Myocar Multi w/spect w/wall motion which revealed a LV EF of 44% -Will repeat ECHO -Recommended that the pt obtain a power of attorney, and recommended he bring his daughter to his next visit as he would like her to be part of his medical care -Will order PET/CT for staging and treatment planning  -Will order blood  tests today  -Will see the pt back in 2 weeks, for treatment planning discussion   2.  . Past  Medical History:  Diagnosis Date  . Arthritis   . Coronary artery disease   . HOH (hard of hearing)   . Hyperlipidemia   . Hypertension   . Ischemic cardiomyopathy   . Myocardial infarction ([place]) 2016  . Quadriceps tendon rupture    LEFT  -continue f/u with PCP -given systolic dysfunction will rpt ECHO   Labs today ECHO in 3-4 days PET/CT In 1 week RTC with Dr Irene Limbo in about 2 weeks   All of the patients questions were answered with apparent satisfaction. The patient knows to call the clinic with any problems, questions or concerns.  The total time spent in the appt was 60 minutes and more than 50% was on counseling and direct patient cares.    Sullivan Lone MD MS AAHIVMS [facility] Hematology/Oncology Physician [facility]  (Office):       [phone] (Work cell):  [phone] (Fax):           [phone]  01/25/2018 1:56 PM  I, Carl [name], am acting as a scribe for Dr. Sullivan Lone.   .I have reviewed the above documentation for accuracy and completeness, and I agree with the above. Brunetta Genera MD

## 2018-01-24 NOTE — Telephone Encounter (Signed)
New referral received from Dr. Gloriann Loan for diffuse large b-cell lymphoma. Pt has been cld and scheduled to see Dr. Irene Limbo on 12/17 at 1pm. Pt aware to arrive 30 minutes early.

## 2018-01-25 ENCOUNTER — Telehealth: Payer: Self-pay | Admitting: Hematology

## 2018-01-25 ENCOUNTER — Inpatient Hospital Stay: Payer: Medicare HMO | Attending: Hematology | Admitting: Hematology

## 2018-01-25 ENCOUNTER — Inpatient Hospital Stay: Payer: Medicare HMO

## 2018-01-25 VITALS — BP 135/75 | HR 64 | Temp 97.8°F | Resp 18 | Ht 73.0 in | Wt 234.0 lb

## 2018-01-25 DIAGNOSIS — I10 Essential (primary) hypertension: Secondary | ICD-10-CM | POA: Diagnosis not present

## 2018-01-25 DIAGNOSIS — C8339 Diffuse large B-cell lymphoma, extranodal and solid organ sites: Secondary | ICD-10-CM

## 2018-01-25 DIAGNOSIS — Z7982 Long term (current) use of aspirin: Secondary | ICD-10-CM | POA: Diagnosis not present

## 2018-01-25 DIAGNOSIS — C83398 Diffuse large b-cell lymphoma of other extranodal and solid organ sites: Secondary | ICD-10-CM

## 2018-01-25 LAB — CBC WITH DIFFERENTIAL/PLATELET
Abs Immature Granulocytes: 0.03 10*3/uL (ref 0.00–0.07)
Basophils Absolute: 0 10*3/uL (ref 0.0–0.1)
Basophils Relative: 1 %
Eosinophils Absolute: 0.2 10*3/uL (ref 0.0–0.5)
Eosinophils Relative: 3 %
HCT: 45 % (ref 39.0–52.0)
Hemoglobin: 14.8 g/dL (ref 13.0–17.0)
Immature Granulocytes: 0 %
Lymphocytes Relative: 30 %
Lymphs Abs: 2 10*3/uL (ref 0.7–4.0)
MCH: 31.7 pg (ref 26.0–34.0)
MCHC: 32.9 g/dL (ref 30.0–36.0)
MCV: 96.4 fL (ref 80.0–100.0)
Monocytes Absolute: 0.9 10*3/uL (ref 0.1–1.0)
Monocytes Relative: 13 %
Neutro Abs: 3.5 10*3/uL (ref 1.7–7.7)
Neutrophils Relative %: 53 %
Platelets: 210 10*3/uL (ref 150–400)
RBC: 4.67 MIL/uL (ref 4.22–5.81)
RDW: 11.9 % (ref 11.5–15.5)
WBC: 6.7 10*3/uL (ref 4.0–10.5)
nRBC: 0 % (ref 0.0–0.2)

## 2018-01-25 LAB — CMP (CANCER CENTER ONLY)
ALT: 21 U/L (ref 0–44)
AST: 18 U/L (ref 15–41)
Albumin: 3.7 g/dL (ref 3.5–5.0)
Alkaline Phosphatase: 75 U/L (ref 38–126)
Anion gap: 7 (ref 5–15)
BUN: 12 mg/dL (ref 8–23)
CO2: 26 mmol/L (ref 22–32)
Calcium: 9.3 mg/dL (ref 8.9–10.3)
Chloride: 108 mmol/L (ref 98–111)
Creatinine: 0.97 mg/dL (ref 0.61–1.24)
GFR, Est AFR Am: >60 mL/min (ref >60)
GFR, Estimated: >60 mL/min (ref >60)
Glucose, Bld: 96 mg/dL (ref 70–99)
Potassium: 4.3 mmol/L (ref 3.5–5.1)
Sodium: 141 mmol/L (ref 135–145)
Total Bilirubin: 1.5 mg/dL — ABNORMAL HIGH (ref 0.3–1.2)
Total Protein: 7.7 g/dL (ref 6.5–8.1)

## 2018-01-25 LAB — LACTATE DEHYDROGENASE: LDH: 134 U/L (ref 98–192)

## 2018-01-25 NOTE — Telephone Encounter (Signed)
Printed calendar and avs, °

## 2018-01-26 LAB — HEPATITIS B CORE ANTIBODY, TOTAL: Hep B Core Total Ab: NEGATIVE

## 2018-01-26 LAB — HIV ANTIBODY (ROUTINE TESTING W REFLEX): HIV Screen 4th Generation wRfx: NONREACTIVE

## 2018-01-26 LAB — HEPATITIS C ANTIBODY: HCV Ab: <0.1 s/co ratio (ref 0.0–0.9)

## 2018-01-26 LAB — HEPATITIS B SURFACE ANTIGEN: Hepatitis B Surface Ag: NEGATIVE

## 2018-01-28 ENCOUNTER — Telehealth: Payer: Self-pay | Admitting: *Deleted

## 2018-01-28 NOTE — Telephone Encounter (Signed)
Sister Ara Kussmaul called to ask (ROI on file) what current plan of care was as she will be part of her brother's care. Advised her that patient has a PET and Echo ordered. Once approved and scheduled and completed, patient has appt with MD to discuss results and plan. Sister verbalized understanding.

## 2018-01-31 ENCOUNTER — Other Ambulatory Visit: Payer: Self-pay | Admitting: Hematology

## 2018-01-31 ENCOUNTER — Ambulatory Visit (HOSPITAL_COMMUNITY)
Admission: RE | Admit: 2018-01-31 | Discharge: 2018-01-31 | Disposition: A | Discharge status: Home / Self Care | Payer: Medicare HMO | Source: Ambulatory Visit | Attending: Hematology | Admitting: Hematology

## 2018-01-31 DIAGNOSIS — C8339 Diffuse large B-cell lymphoma, extranodal and solid organ sites: Secondary | ICD-10-CM | POA: Diagnosis not present

## 2018-01-31 DIAGNOSIS — E785 Hyperlipidemia, unspecified: Secondary | ICD-10-CM | POA: Insufficient documentation

## 2018-01-31 DIAGNOSIS — C83398 Diffuse large b-cell lymphoma of other extranodal and solid organ sites: Secondary | ICD-10-CM

## 2018-01-31 DIAGNOSIS — I10 Essential (primary) hypertension: Secondary | ICD-10-CM | POA: Insufficient documentation

## 2018-01-31 DIAGNOSIS — I252 Old myocardial infarction: Secondary | ICD-10-CM | POA: Insufficient documentation

## 2018-01-31 DIAGNOSIS — Z5111 Encounter for antineoplastic chemotherapy: Secondary | ICD-10-CM | POA: Diagnosis not present

## 2018-01-31 DIAGNOSIS — I2510 Atherosclerotic heart disease of native coronary artery without angina pectoris: Secondary | ICD-10-CM | POA: Insufficient documentation

## 2018-01-31 NOTE — Progress Notes (Signed)
  Echocardiogram 2D Echocardiogram Limited has been performed.  Darlina Sicilian M 01/31/2018, 11:26 AM

## 2018-02-07 ENCOUNTER — Ambulatory Visit (HOSPITAL_COMMUNITY)
Admission: RE | Admit: 2018-02-07 | Discharge: 2018-02-07 | Disposition: A | Discharge status: Home / Self Care | Payer: Medicare HMO | Source: Ambulatory Visit | Attending: Hematology | Admitting: Hematology

## 2018-02-07 DIAGNOSIS — C8339 Diffuse large B-cell lymphoma, extranodal and solid organ sites: Secondary | ICD-10-CM | POA: Diagnosis not present

## 2018-02-07 DIAGNOSIS — C6290 Malignant neoplasm of unspecified testis, unspecified whether descended or undescended: Secondary | ICD-10-CM | POA: Diagnosis not present

## 2018-02-07 DIAGNOSIS — C8336 Diffuse large B-cell lymphoma, intrapelvic lymph nodes: Secondary | ICD-10-CM | POA: Diagnosis not present

## 2018-02-07 LAB — GLUCOSE, CAPILLARY: Glucose-Capillary: 95 mg/dL (ref 70–99)

## 2018-02-07 MED ORDER — FLUDEOXYGLUCOSE F - 18 (FDG) INJECTION
11.8000 | Freq: Once | INTRAVENOUS | Status: AC | PRN
  Administered 2018-02-07: 11.8 via INTRAVENOUS

## 2018-02-10 DIAGNOSIS — I252 Old myocardial infarction: Secondary | ICD-10-CM | POA: Diagnosis not present

## 2018-02-10 DIAGNOSIS — I255 Ischemic cardiomyopathy: Secondary | ICD-10-CM | POA: Diagnosis not present

## 2018-02-10 DIAGNOSIS — R7303 Prediabetes: Secondary | ICD-10-CM | POA: Diagnosis not present

## 2018-02-10 DIAGNOSIS — I2510 Atherosclerotic heart disease of native coronary artery without angina pectoris: Secondary | ICD-10-CM | POA: Diagnosis not present

## 2018-02-10 DIAGNOSIS — E785 Hyperlipidemia, unspecified: Secondary | ICD-10-CM | POA: Diagnosis not present

## 2018-02-10 DIAGNOSIS — M1990 Unspecified osteoarthritis, unspecified site: Secondary | ICD-10-CM | POA: Diagnosis not present

## 2018-02-10 DIAGNOSIS — I10 Essential (primary) hypertension: Secondary | ICD-10-CM | POA: Diagnosis not present

## 2018-02-14 NOTE — Progress Notes (Signed)
HEMATOLOGY/ONCOLOGY CONSULTATION NOTE  Date of Service: 02/15/2018  Patient Care Team: [name], MD as PCP - General (Family Medicine)  CHIEF COMPLAINTS/PURPOSE OF CONSULTATION:  Primary testicular diffuse Large B-Cell Lymphoma   HISTORY OF PRESENTING ILLNESS:   Carl [name] is a wonderful 78 y.o. male who has been referred to [place] by Dr. [name] for evaluation and management of Diffuse Large B-Cell Lymphoma. The pt reports that he is doing well overall.  The pt notes that he first began feeling differently about 4-5 months ago, when he noticed left testicular swelling. He notes that the his testicle was "a little bit larger than a golf ball," and that this was not painful. The pt notes that he has not had any abdominal distension or abdominal pain. The pt denies any fevers, chills, night sweats, or unexpected weight loss. He also denies any leg swelling. The pt denies any trauma sustained by the testicles. He denies noticing any other related symptoms.   The pt reports that he believed he had food poisoning prior to this, as he had lots of diarrhea. He notes that he has observed a small knot on the back of his right wrist, which has not changed over the last year.  The pt has two stents in his heart which were placed in 2016. The pt notes that his blood pressure has been stable and well-controlled. He notes that his last ECHO was 6 months ago, and he has not had any CP or SOB since that time. He takes 81mg  aspirin daily. He also takes Atorvastatin. The pt denies feeling limited in his walking by SOB or CP. He notes that he could walk 2 miles before ceasing to walk, noting that feeling tired in his muscles would limit him.   The pt has recently pursued surgery on his throat with ENT Dr. Melida Quitter for altered voice and hoarseness related to his vocal cords.   The pt notes that he currently lives by himself, and is not married. He has a daughter who is 56 years  old. He notes that his daughter "tries to help out." He notes that he does not have a power of attorney, but that this would be his daughter.   Of note prior to the patient's visit today, pt has had a Left Testis Surgical biopsy completed on 01/05/18 with results revealing Diffuse Large B-Cell Lymphoma.   The pt also had an US Scrotum on 12/08/17 which revealed Abnormal appearance of the left testicle which is larger than the right and diffusely heterogeneous in echotexture. Cannot exclude infiltrating process/mass. Appearance is concerning for possible neoplasm.  Most recent lab results (11/26/17) of BMP revealed all values WNL except for Glucose at 104, Creatinine at 1.30, Calcium at 8.7, GFR at 59 11/26/17 Hemoglobin value at 13.6.   On review of systems, pt reports stable energy levels, left testicular swelling, and denies fevers, chills, night sweats, unexpected weight loss, CP, SOB, back pains, flank pains, abdominal pains, leg swelling, testicular pain, and any other symptoms.   On PMHx the pt reports CAD, two heart stents placed in 2016. On Social Hx the pt denies smoking.  On Family Hx the pt reports mother with lymphoma of the breast. Two sisters with breast cancer, one at age 33.  Interval History:   Carl [name] returns today for management and evaluation of his Primary testicular Diffuse Large B-Cell Lymphoma. The patient's last visit with [place] was on 01/25/18. He is accompanied  today by two of his sisters. The pt reports that he is doing well overall.   The pt reports that he has not developed any new concerns in the brief interim. He has continued working out at [employer] regularly. He denies noticing any new lumps or bumps and denies any abdominal pains. The pt denies leg swelling.   The pt as LAD and diagonal stents. The pt notes that he functions as well as he ever has at home, and can walk 3 miles before needing to rest.   Of note since the patient's last visit, pt has  had a PET/CT completed on 02/07/18 with results revealing No findings for metabolically active lymphoma involving the neck, chest, abdomen/pelvis or osseous structures.  Lab results (01/25/18) of CBC w/diff and CMP is as follows: all values are WNL except for Total Bilirubin at 1.5. 01/25/18 LDH at 134  On review of systems, pt reports good energy levels, keeping active, and denies abdominal pains, leg swelling, and any other symptoms.   MEDICAL HISTORY:  Past Medical History:  Diagnosis Date  . Arthritis   . Coronary artery disease   . HOH (hard of hearing)   . Hyperlipidemia   . Hypertension   . Ischemic cardiomyopathy   . Myocardial infarction ([place]) 2016  . Quadriceps tendon rupture    LEFT    SURGICAL HISTORY: Past Surgical History:  Procedure Laterality Date  . CARDIAC CATHETERIZATION N/A 10/15/2014   Procedure: Left Heart Cath and Coronary Angiography;  Surgeon: Charolette Forward, MD;  Location: [facility];  Service: Cardiovascular;  Laterality: N/A;  . COLONOSCOPY    . MICROLARYNGOSCOPY WITH CO2 LASER AND EXCISION OF VOCAL CORD LESION N/A 11/26/2017   Procedure: MICROLARYNGOSCOPY WITH CO2 LASER AND EXCISION OF VOCAL CORD LESION WITH JET VENTILATION;  Surgeon: Melida Quitter, MD;  Location: [facility];  Service: ENT;  Laterality: N/A;  . QUADRICEPS TENDON REPAIR Left 02/20/2014   Procedure: LEFT REPAIR QUADRICEP TENDON;  Surgeon: Mauri Pole, MD;  Location: [facility];  Service: Orthopedics;  Laterality: Left;  . THROAT SURGERY  2010    SOCIAL HISTORY: Social History   Socioeconomic History  . Marital status: Divorced    Spouse name: Not on file  . Number of children: Not on file  . Years of education: Not on file  . Highest education level: Not on file  Occupational History  . Not on file  Social Needs  . Financial resource strain: Not on file  . Food insecurity:    Worry: Not on file    Inability: Not on file  . Transportation needs:    Medical: Not on file     Non-medical: Not on file  Tobacco Use  . Smoking status: Never Smoker  . Smokeless tobacco: Never Used  Substance and Sexual Activity  . Alcohol use: Yes    Comment: OCCASIONAL  . Drug use: No  . Sexual activity: Not on file  Lifestyle  . Physical activity:    Days per week: Not on file    Minutes per session: Not on file  . Stress: Not on file  Relationships  . Social connections:    Talks on phone: Not on file    Gets together: Not on file    Attends religious service: Not on file    Active member of club or organization: Not on file    Attends meetings of clubs or organizations: Not on file    Relationship status: Not on file  .  Intimate partner violence:    Fear of current or ex partner: Not on file    Emotionally abused: Not on file    Physically abused: Not on file    Forced sexual activity: Not on file  Other Topics Concern  . Not on file  Social History Narrative  . Not on file    FAMILY HISTORY: No family history on file.  ALLERGIES:  has No Known Allergies.  MEDICATIONS:  Current Outpatient Medications  Medication Sig Dispense Refill  . aspirin EC 81 MG tablet Take 81 mg by mouth daily.    Marland Kitchen atorvastatin (LIPITOR) 40 MG tablet Take 1 tablet (40 mg total) by mouth daily at 6 PM. 60 tablet 3  . carvedilol (COREG) 6.25 MG tablet Take 6.25 mg by mouth 2 (two) times daily with a meal.    . cholecalciferol (VITAMIN D) 1000 UNITS tablet Take 1,000 Units by mouth daily.    Marland Kitchen losartan (COZAAR) 25 MG tablet Take 25 mg by mouth every evening.    . Multiple Vitamin (MULTIVITAMIN WITH MINERALS) TABS tablet Take 1 tablet by mouth daily.    . nitroGLYCERIN (NITROSTAT) 0.4 MG SL tablet Place 1 tablet (0.4 mg total) under the tongue every 5 (five) minutes x 3 doses as needed for chest pain. 25 tablet 12  . Polyethyl Glycol-Propyl Glycol (SYSTANE OP) Place 1 drop into both eyes at bedtime.     . white petrolatum (VASELINE) GEL Apply 1 application topically as needed for dry  skin (itching).     No current facility-administered medications for this visit.     REVIEW OF SYSTEMS:    A 10+ POINT REVIEW OF SYSTEMS WAS OBTAINED including neurology, dermatology, psychiatry, cardiac, respiratory, lymph, extremities, GI, GU, Musculoskeletal, constitutional, breasts, reproductive, HEENT.  All pertinent positives are noted in the HPI.  All others are negative.   PHYSICAL EXAMINATION: ECOG PERFORMANCE STATUS: 2 - Symptomatic, <50% confined to bed  Vitals:   02/15/18 1528  BP: 129/80  Pulse: 63  Resp: 18  Temp: 98.2 F (36.8 C)  SpO2: 99%   Filed Weights   02/15/18 1528  Weight: 233 lb 4.8 oz (105.8 kg)   .Body mass index is 30.78 kg/m.  GENERAL:alert, in no acute distress and comfortable SKIN: no acute rashes, no significant lesions EYES: conjunctiva are pink and non-injected, sclera anicteric OROPHARYNX: MMM, no exudates, no oropharyngeal erythema or ulceration NECK: supple, no JVD LYMPH:  no palpable lymphadenopathy in the cervical, axillary or inguinal regions LUNGS: clear to auscultation b/l with normal respiratory effort HEART: regular rate & rhythm ABDOMEN:  normoactive bowel sounds , non tender, not distended. No palpable hepatosplenomegaly.  Extremity: no pedal edema PSYCH: alert & oriented x 3 with fluent speech NEURO: no focal motor/sensory deficits   LABORATORY DATA:  I have reviewed the data as listed  . CBC Latest Ref Rng & Units 01/25/2018 11/26/2017 10/19/2014  WBC 4.0 - 10.5 K/uL 6.7 - 7.9  Hemoglobin 13.0 - 17.0 g/dL 14.8 13.6 13.8  Hematocrit 39.0 - 52.0 % 45.0 - 40.8  Platelets 150 - 400 K/uL 210 - 182    . CMP Latest Ref Rng & Units 01/25/2018 11/26/2017 10/19/2014  Glucose 70 - 99 mg/dL 96 104(H) 105(H)  BUN 8 - 23 mg/dL 12 17 14   Creatinine 0.61 - 1.24 mg/dL 0.97 1.30(H) 1.06  Sodium 135 - 145 mmol/L 141 138 139  Potassium 3.5 - 5.1 mmol/L 4.3 3.6 3.7  Chloride 98 - 111 mmol/L 108 107 107  CO2 22 - 32 mmol/L 26 27 24     Calcium 8.9 - 10.3 mg/dL 9.3 8.7(L) 8.4(L)  Total Protein 6.5 - 8.1 g/dL 7.7 - -  Total Bilirubin 0.3 - 1.2 mg/dL 1.5(H) - -  Alkaline Phos 38 - 126 U/L 75 - -  AST 15 - 41 U/L 18 - -  ALT 0 - 44 U/L 21 - -   01/05/18 Pathology:    RADIOGRAPHIC STUDIES: I have personally reviewed the radiological images as listed and agreed with the findings in the report. Nm Pet Image Initial (pi) Skull Base To Thigh  Result Date: 02/07/2018 CLINICAL DATA:  Initial treatment strategy for diffuse large B-cell lymphoma of the testicle. EXAM: NUCLEAR MEDICINE PET SKULL BASE TO THIGH TECHNIQUE: 11.8 mCi F-18 FDG was injected intravenously. Full-ring PET imaging was performed from the skull base to thigh after the radiotracer. CT data was obtained and used for attenuation correction and anatomic localization. Fasting blood glucose: 95 mg/dl COMPARISON:  Testicular ultrasound 12/08/2017 FINDINGS: Mediastinal blood pool activity: SUV max 2.28 NECK: No hypermetabolic lymph nodes in the neck. Incidental CT findings: none CHEST: No hypermetabolic mediastinal or hilar nodes. No supraclavicular or axillary adenopathy. No suspicious pulmonary nodules on the CT scan. Incidental CT findings: Coronary artery calcifications and LAD stent. ABDOMEN/PELVIS: No abnormal hypermetabolic activity within the liver, pancreas, adrenal glands, or spleen. No hypermetabolic lymph nodes in the abdomen or pelvis. Incidental CT findings: Surgical changes from recent left orchiectomy. No inguinal adenopathy. TURP changes. SKELETON: No focal hypermetabolic activity to suggest skeletal metastasis. Incidental CT findings: none IMPRESSION: No findings for metabolically active lymphoma involving the neck, chest, abdomen/pelvis or osseous structures. Electronically Signed   By: Marijo Sanes M.D.   On: 02/07/2018 17:40    ASSESSMENT & PLAN:   78 y.o. male with  1. Primary Testicular Diffuse Large B-Cell Lymphoma, Stage 1E 11/30/16 NM Myocar  Multi w/spect w/wall motion which revealed a LV EF of 44%  12/08/17 US Scrotum revealed Abnormal appearance of the left testicle which is larger than the right and diffusely heterogeneous in echotexture. Cannot exclude infiltrating process/mass. Appearance is concerning for possible neoplasm.  01/05/18 Left testes biopsy revealed Primary Testicular Diffuse Large B-Cell Lymphoma   PLAN -Discussed pt labwork from 01/25/18; blood counts all normal, LDH normal at 134, chemistries are stable  -01/25/18 Hep B, Hep C and HIV negative  -Discussed the 02/07/18 PET/CT which revealed No findings for metabolically active lymphoma involving the neck, chest, abdomen/pelvis or osseous structures.  -Discussed the 01/31/18 ECHO which revealed a LV EF of 51%. Left ventricle: The cavity size was normal. Wall motion was normal; there were no regional wall motion abnormalities. Atrial septum: No defect or patent foramen ovale was identified. -Discussed that the staging indicated by the PET/CT is Stage 1E -Discussed the recommendation for treatment with dose adjusted R-CHOP, for 4-6 cycles followed by RT to both testicles. -Will discuss role for IT MTX with my tumor board, as primary testicular DLBCL does have higher risk for brain recurrence, though pt has a borderline functional status/age. -Will discuss treatment plan further with my colleagues and will then see pt back for final treatment decision in 1-2 weeks  -Previously recommended that the pt obtain a power of attorney -Will see the pt back in 2 weeks   2.  . Past Medical History:  Diagnosis Date  . Arthritis   . Coronary artery disease   . HOH (hard of hearing)   . Hyperlipidemia   .  Hypertension   . Ischemic cardiomyopathy   . Myocardial infarction ([place]) 2016  . Quadriceps tendon rupture    LEFT  -continue f/u with PCP -given systolic dysfunction will rpt ECHO   RTC with Dr Irene Limbo in 2 weeks with labs   All of the patients questions were  answered with apparent satisfaction. The patient knows to call the clinic with any problems, questions or concerns.  The total time spent in the appt was 45 minutes and more than 50% was on counseling and direct patient cares.    Sullivan Lone MD MS AAHIVMS [facility] Hematology/Oncology Physician [facility]  (Office):       [phone] (Work cell):  [phone] (Fax):           [phone]  02/15/2018 5:00 PM  I, Baldwin Jamaica, am acting as a scribe for Dr. Sullivan Lone.   .I have reviewed the above documentation for accuracy and completeness, and I agree with the above. Brunetta Genera MD

## 2018-02-15 ENCOUNTER — Inpatient Hospital Stay: Payer: Medicare HMO | Attending: Hematology | Admitting: Hematology

## 2018-02-15 VITALS — BP 129/80 | HR 63 | Temp 98.2°F | Resp 18 | Ht 73.0 in | Wt 233.3 lb

## 2018-02-15 DIAGNOSIS — Z7982 Long term (current) use of aspirin: Secondary | ICD-10-CM | POA: Insufficient documentation

## 2018-02-15 DIAGNOSIS — I252 Old myocardial infarction: Secondary | ICD-10-CM | POA: Insufficient documentation

## 2018-02-15 DIAGNOSIS — I10 Essential (primary) hypertension: Secondary | ICD-10-CM | POA: Insufficient documentation

## 2018-02-15 DIAGNOSIS — C8339 Diffuse large B-cell lymphoma, extranodal and solid organ sites: Secondary | ICD-10-CM | POA: Diagnosis not present

## 2018-02-15 DIAGNOSIS — Z79899 Other long term (current) drug therapy: Secondary | ICD-10-CM | POA: Diagnosis not present

## 2018-02-15 NOTE — Patient Instructions (Signed)
Thank you for choosing [facility] to provide your oncology and hematology care.  To afford each patient quality time with our providers, please arrive 30 minutes before your scheduled appointment time.  If you arrive late for your appointment, you may be asked to reschedule.  We strive to give you quality time with our providers, and arriving late affects you and other patients whose appointments are after yours.    If you are a no show for multiple scheduled visits, you may be dismissed from the clinic at the providers discretion.     Again, thank you for choosing [facility], our hope is that these requests will decrease the amount of time that you wait before being seen by our physicians.  ______________________________________________________________________   Should you have questions after your visit to the [facility], please contact our office at [phone] between the hours of 8:30 and 4:30 p.m.    Voicemails left after 4:30p.m will not be returned until the following business day.     For prescription refill requests, please have your pharmacy contact us directly.  Please also try to allow 48 hours for prescription requests.     Please contact the scheduling department for questions regarding scheduling.  For scheduling of procedures such as PET scans, CT scans, MRI, Ultrasound, etc please contact central scheduling at [phone].     Resources For Cancer Patients and Caregivers:    Oncolink.org:  A wonderful resource for patients and healthcare providers for information regarding your disease, ways to tract your treatment, what to expect, etc.      American Cancer Society:  800-227-2345  Can help patients locate various types of support and financial assistance   Cancer Care: 1-800-813-HOPE (4673) Provides financial assistance, online support groups, medication/co-pay assistance.     [place] DSS:  336-641-3447 Where to apply  for food stamps, Medicaid, and utility assistance   Medicare Rights Center: 800-333-4114 Helps people with Medicare understand their rights and benefits, navigate the Medicare system, and secure the quality healthcare they deserve   SCAT: 336-333-6589 [employer] [employer]'s shared-ride transportation service for eligible riders who have a disability that prevents them from riding the fixed route bus.     For additional information on assistance programs please contact our social worker:   Carl [name]:  [phone]  

## 2018-02-16 ENCOUNTER — Telehealth: Payer: Self-pay

## 2018-02-16 NOTE — Telephone Encounter (Signed)
Per 1/7 los called and left a detailed msg concerning a newly scheduled appointment. Mailed a letter with a calender enclosed

## 2018-03-01 NOTE — Progress Notes (Signed)
HEMATOLOGY/ONCOLOGY CONSULTATION NOTE  Date of Service: 03/02/2018  Patient Care Team: [name], MD as PCP - General (Family Medicine)  CHIEF COMPLAINTS/PURPOSE OF CONSULTATION:  Primary testicular diffuse Large B-Cell Lymphoma   HISTORY OF PRESENTING ILLNESS:   Carl [name] is a wonderful 78 y.o. male who has been referred to [place] by Dr. [name] for evaluation and management of Diffuse Large B-Cell Lymphoma. The pt reports that he is doing well overall.  The pt notes that he first began feeling differently about 4-5 months ago, when he noticed left testicular swelling. He notes that the his testicle was "a little bit larger than a golf ball," and that this was not painful. The pt notes that he has not had any abdominal distension or abdominal pain. The pt denies any fevers, chills, night sweats, or unexpected weight loss. He also denies any leg swelling. The pt denies any trauma sustained by the testicles. He denies noticing any other related symptoms.   The pt reports that he believed he had food poisoning prior to this, as he had lots of diarrhea. He notes that he has observed a small knot on the back of his right wrist, which has not changed over the last year.  The pt has two stents in his heart which were placed in 2016. The pt notes that his blood pressure has been stable and well-controlled. He notes that his last ECHO was 6 months ago, and he has not had any CP or SOB since that time. He takes 81mg  aspirin daily. He also takes Atorvastatin. The pt denies feeling limited in his walking by SOB or CP. He notes that he could walk 2 miles before ceasing to walk, noting that feeling tired in his muscles would limit him.   The pt has recently pursued surgery on his throat with ENT Dr. Melida Quitter for altered voice and hoarseness related to his vocal cords.   The pt notes that he currently lives by himself, and is not married. He has a daughter who is 64 years  old. He notes that his daughter "tries to help out." He notes that he does not have a power of attorney, but that this would be his daughter.   Of note prior to the patient's visit today, pt has had a Left Testis Surgical biopsy completed on 01/05/18 with results revealing Diffuse Large B-Cell Lymphoma.   The pt also had an US Scrotum on 12/08/17 which revealed Abnormal appearance of the left testicle which is larger than the right and diffusely heterogeneous in echotexture. Cannot exclude infiltrating process/mass. Appearance is concerning for possible neoplasm.  Most recent lab results (11/26/17) of BMP revealed all values WNL except for Glucose at 104, Creatinine at 1.30, Calcium at 8.7, GFR at 59 11/26/17 Hemoglobin value at 13.6.   On review of systems, pt reports stable energy levels, left testicular swelling, and denies fevers, chills, night sweats, unexpected weight loss, CP, SOB, back pains, flank pains, abdominal pains, leg swelling, testicular pain, and any other symptoms.   On PMHx the pt reports CAD, two heart stents placed in 2016. On Social Hx the pt denies smoking.  On Family Hx the pt reports mother with lymphoma of the breast. Two sisters with breast cancer, one at age 73.  Interval History:   Carl [name] returns today for management and evaluation of his Primary testicular Diffuse Large B-Cell Lymphoma. The patient's last visit with [place] was on 02/15/18. The pt reports  that he is doing well overall.   The pt reports that he has not developed any new concerns in the interim. He denies any chest pain or SOB when he ambulates and denies any remaining limitations from his previous heart attack.   The pt notes that his testicular incisions have completely healed and denies any skin openings.   The pt lives alone and functions independently and regularly attends a pilates class at his gym. He notes that he cooks sometimes, goes to [employer], or warms up frozen dinners.    Lab results today (03/02/18) of CBC w/diff and CMP is as follows: all values are WNL except for Total Bilirubin at 1.7. 03/02/18 LDH is . Lab Results  Component Value Date   LDH 140 03/02/2018   On review of systems, pt reports stable energy levels, staying active, and denies concerns for infections, skin openings, abdominal pains, testicular pain or swelling, leg swelling, and any other symptoms.   MEDICAL HISTORY:  Past Medical History:  Diagnosis Date  . Arthritis   . Coronary artery disease   . HOH (hard of hearing)   . Hyperlipidemia   . Hypertension   . Ischemic cardiomyopathy   . Myocardial infarction ([place]) 2016  . Quadriceps tendon rupture    LEFT    SURGICAL HISTORY: Past Surgical History:  Procedure Laterality Date  . CARDIAC CATHETERIZATION N/A 10/15/2014   Procedure: Left Heart Cath and Coronary Angiography;  Surgeon: Charolette Forward, MD;  Location: [facility];  Service: Cardiovascular;  Laterality: N/A;  . COLONOSCOPY    . MICROLARYNGOSCOPY WITH CO2 LASER AND EXCISION OF VOCAL CORD LESION N/A 11/26/2017   Procedure: MICROLARYNGOSCOPY WITH CO2 LASER AND EXCISION OF VOCAL CORD LESION WITH JET VENTILATION;  Surgeon: Melida Quitter, MD;  Location: [facility];  Service: ENT;  Laterality: N/A;  . QUADRICEPS TENDON REPAIR Left 02/20/2014   Procedure: LEFT REPAIR QUADRICEP TENDON;  Surgeon: Mauri Pole, MD;  Location: [facility];  Service: Orthopedics;  Laterality: Left;  . THROAT SURGERY  2010    SOCIAL HISTORY: Social History   Socioeconomic History  . Marital status: Divorced    Spouse name: Not on file  . Number of children: Not on file  . Years of education: Not on file  . Highest education level: Not on file  Occupational History  . Not on file  Social Needs  . Financial resource strain: Not on file  . Food insecurity:    Worry: Not on file    Inability: Not on file  . Transportation needs:    Medical: Not on file    Non-medical: Not on file  Tobacco Use   . Smoking status: Never Smoker  . Smokeless tobacco: Never Used  Substance and Sexual Activity  . Alcohol use: Yes    Comment: OCCASIONAL  . Drug use: No  . Sexual activity: Not on file  Lifestyle  . Physical activity:    Days per week: Not on file    Minutes per session: Not on file  . Stress: Not on file  Relationships  . Social connections:    Talks on phone: Not on file    Gets together: Not on file    Attends religious service: Not on file    Active member of club or organization: Not on file    Attends meetings of clubs or organizations: Not on file    Relationship status: Not on file  . Intimate partner violence:    Fear of  current or ex partner: Not on file    Emotionally abused: Not on file    Physically abused: Not on file    Forced sexual activity: Not on file  Other Topics Concern  . Not on file  Social History Narrative  . Not on file    FAMILY HISTORY: No family history on file.  ALLERGIES:  has No Known Allergies.  MEDICATIONS:  Current Outpatient Medications  Medication Sig Dispense Refill  . aspirin EC 81 MG tablet Take 81 mg by mouth daily.    Marland Kitchen atorvastatin (LIPITOR) 40 MG tablet Take 1 tablet (40 mg total) by mouth daily at 6 PM. 60 tablet 3  . carvedilol (COREG) 6.25 MG tablet Take 6.25 mg by mouth 2 (two) times daily with a meal.    . cholecalciferol (VITAMIN D) 1000 UNITS tablet Take 1,000 Units by mouth daily.    Marland Kitchen losartan (COZAAR) 25 MG tablet Take 25 mg by mouth every evening.    . Multiple Vitamin (MULTIVITAMIN WITH MINERALS) TABS tablet Take 1 tablet by mouth daily.    . nitroGLYCERIN (NITROSTAT) 0.4 MG SL tablet Place 1 tablet (0.4 mg total) under the tongue every 5 (five) minutes x 3 doses as needed for chest pain. 25 tablet 12  . Polyethyl Glycol-Propyl Glycol (SYSTANE OP) Place 1 drop into both eyes at bedtime.     . white petrolatum (VASELINE) GEL Apply 1 application topically as needed for dry skin (itching).     No current  facility-administered medications for this visit.     REVIEW OF SYSTEMS:    A 10+ POINT REVIEW OF SYSTEMS WAS OBTAINED including neurology, dermatology, psychiatry, cardiac, respiratory, lymph, extremities, GI, GU, Musculoskeletal, constitutional, breasts, reproductive, HEENT.  All pertinent positives are noted in the HPI.  All others are negative.   PHYSICAL EXAMINATION: ECOG PERFORMANCE STATUS: 2 - Symptomatic, <50% confined to bed  Vitals:   03/02/18 1408  BP: 115/69  Pulse: (!) 59  Resp: 18  Temp: 97.8 F (36.6 C)  SpO2: 99%   Filed Weights   03/02/18 1408  Weight: 235 lb 3.2 oz (106.7 kg)   .Body mass index is 31.03 kg/m.  GENERAL:alert, in no acute distress and comfortable SKIN: no acute rashes, no significant lesions EYES: conjunctiva are pink and non-injected, sclera anicteric OROPHARYNX: MMM, no exudates, no oropharyngeal erythema or ulceration NECK: supple, no JVD LYMPH:  no palpable lymphadenopathy in the cervical, axillary or inguinal regions LUNGS: clear to auscultation b/l with normal respiratory effort HEART: regular rate & rhythm ABDOMEN:  normoactive bowel sounds , non tender, not distended. No palpable hepatosplenomegaly.  Extremity: no pedal edema PSYCH: alert & oriented x 3 with fluent speech NEURO: no focal motor/sensory deficits   LABORATORY DATA:  I have reviewed the data as listed  . CBC Latest Ref Rng & Units 03/02/2018 01/25/2018 11/26/2017  WBC 4.0 - 10.5 K/uL 6.0 6.7 -  Hemoglobin 13.0 - 17.0 g/dL 14.2 14.8 13.6  Hematocrit 39.0 - 52.0 % 42.5 45.0 -  Platelets 150 - 400 K/uL 192 210 -    . CMP Latest Ref Rng & Units 03/02/2018 01/25/2018 11/26/2017  Glucose 70 - 99 mg/dL 98 96 104(H)  BUN 8 - 23 mg/dL 11 12 17   Creatinine 0.61 - 1.24 mg/dL 1.06 0.97 1.30(H)  Sodium 135 - 145 mmol/L 140 141 138  Potassium 3.5 - 5.1 mmol/L 4.5 4.3 3.6  Chloride 98 - 111 mmol/L 104 108 107  CO2 22 - 32 mmol/L 28  26 27  Calcium 8.9 - 10.3 mg/dL 9.0 9.3  8.7(L)  Total Protein 6.5 - 8.1 g/dL 7.2 7.7 -  Total Bilirubin 0.3 - 1.2 mg/dL 1.7(H) 1.5(H) -  Alkaline Phos 38 - 126 U/L 66 75 -  AST 15 - 41 U/L 22 18 -  ALT 0 - 44 U/L 21 21 -   01/05/18 Pathology:    RADIOGRAPHIC STUDIES: I have personally reviewed the radiological images as listed and agreed with the findings in the report. Nm Pet Image Initial (pi) Skull Base To Thigh  Result Date: 02/07/2018 CLINICAL DATA:  Initial treatment strategy for diffuse large B-cell lymphoma of the testicle. EXAM: NUCLEAR MEDICINE PET SKULL BASE TO THIGH TECHNIQUE: 11.8 mCi F-18 FDG was injected intravenously. Full-ring PET imaging was performed from the skull base to thigh after the radiotracer. CT data was obtained and used for attenuation correction and anatomic localization. Fasting blood glucose: 95 mg/dl COMPARISON:  Testicular ultrasound 12/08/2017 FINDINGS: Mediastinal blood pool activity: SUV max 2.28 NECK: No hypermetabolic lymph nodes in the neck. Incidental CT findings: none CHEST: No hypermetabolic mediastinal or hilar nodes. No supraclavicular or axillary adenopathy. No suspicious pulmonary nodules on the CT scan. Incidental CT findings: Coronary artery calcifications and LAD stent. ABDOMEN/PELVIS: No abnormal hypermetabolic activity within the liver, pancreas, adrenal glands, or spleen. No hypermetabolic lymph nodes in the abdomen or pelvis. Incidental CT findings: Surgical changes from recent left orchiectomy. No inguinal adenopathy. TURP changes. SKELETON: No focal hypermetabolic activity to suggest skeletal metastasis. Incidental CT findings: none IMPRESSION: No findings for metabolically active lymphoma involving the neck, chest, abdomen/pelvis or osseous structures. Electronically Signed   By: Marijo Sanes M.D.   On: 02/07/2018 17:40    ASSESSMENT & PLAN:   78 y.o. male with  1. Primary Testicular Diffuse Large B-Cell Lymphoma, Stage 1E 11/30/16 NM Myocar Multi w/spect w/wall motion  which revealed a LV EF of 44%  12/08/17 US Scrotum revealed Abnormal appearance of the left testicle which is larger than the right and diffusely heterogeneous in echotexture. Cannot exclude infiltrating process/mass. Appearance is concerning for possible neoplasm.  01/05/18 Left testes biopsy revealed Primary Testicular Diffuse Large B-Cell Lymphoma   01/25/18 Hep B, Hep C and HIV negative  01/31/18 ECHO revealed a LV EF of 51%. Left ventricle: The cavity size was normal. Wall motion was normal; there were no regional wall motion abnormalities. Atrial septum: No defect or patent foramen ovale was identified.  02/07/18 PET/CT revealed No findings for metabolically active lymphoma involving the neck, chest, abdomen/pelvis or osseous structures.  PLAN -Discussed pt labwork today, 03/02/18; CBC w/diff remains normal, chemistries are stable  -Discussed this patient's case with my tumor board and the recommendation for 6 cycles of R-mini-CHOP, RT to both testicles, and IT MTX , which the pt agrees with -Discussed that IT MTX is possibly indicated as there is a noted probability of brain recurrence, and would tentatively begin this during C2 or 3 after tolerance to R-mini-CHOP is displayed -Discussed infection prevention strategies with the pt including crowd avoidance and wearing a mask while in public -Will set the pt up for chemotherapy counseling  -Will refer the pt to IR for port placement -Will plan to start C1 R-mini-CHOP with G-CSF support in 7-10 days  -Previously recommended that the pt obtain a power of attorney -Will see the pt back in 2 weeks  2.  . Past Medical History:  Diagnosis Date  . Arthritis   . Coronary artery disease   .  HOH (hard of hearing)   . Hyperlipidemia   . Hypertension   . Ischemic cardiomyopathy   . Myocardial infarction ([place]) 2016  . Quadriceps tendon rupture    LEFT  -continue f/u with PCP -given systolic dysfunction will rpt ECHO   Chemo-counseling  for R-CHOP with neulasta in 3-5 days Port a cath placement in 3 days Schedule to Start R-CHOP chemotherapy with neulasta in 7 days RTC with Dr Irene Limbo in 14 days (7 days post chemotherapy for toxicity check with labs)   All of the patients questions were answered with apparent satisfaction. The patient knows to call the clinic with any problems, questions or concerns.  The total time spent in the appt was 30 minutes and more than 50% was on counseling and direct patient cares.    Sullivan Lone MD MS AAHIVMS [facility] [place] Hematology/Oncology Physician [facility]  (Office):       [phone] (Work cell):  [phone] (Fax):           [phone]  03/02/2018 2:59 PM  I, Baldwin Jamaica, am acting as a scribe for Dr. Sullivan Lone.   .I have reviewed the above documentation for accuracy and completeness, and I agree with the above. Brunetta Genera MD

## 2018-03-02 ENCOUNTER — Inpatient Hospital Stay: Payer: Medicare HMO

## 2018-03-02 ENCOUNTER — Telehealth: Payer: Self-pay | Admitting: Hematology

## 2018-03-02 ENCOUNTER — Inpatient Hospital Stay (HOSPITAL_BASED_OUTPATIENT_CLINIC_OR_DEPARTMENT_OTHER): Payer: Medicare HMO | Admitting: Hematology

## 2018-03-02 ENCOUNTER — Other Ambulatory Visit: Payer: Self-pay | Admitting: Hematology

## 2018-03-02 VITALS — BP 115/69 | HR 59 | Temp 97.8°F | Resp 18 | Ht 73.0 in | Wt 235.2 lb

## 2018-03-02 DIAGNOSIS — I252 Old myocardial infarction: Secondary | ICD-10-CM | POA: Diagnosis not present

## 2018-03-02 DIAGNOSIS — Z79899 Other long term (current) drug therapy: Secondary | ICD-10-CM | POA: Diagnosis not present

## 2018-03-02 DIAGNOSIS — C8339 Diffuse large B-cell lymphoma, extranodal and solid organ sites: Secondary | ICD-10-CM | POA: Diagnosis not present

## 2018-03-02 DIAGNOSIS — Z7982 Long term (current) use of aspirin: Secondary | ICD-10-CM

## 2018-03-02 DIAGNOSIS — I10 Essential (primary) hypertension: Secondary | ICD-10-CM

## 2018-03-02 LAB — CMP (CANCER CENTER ONLY)
ALT: 21 U/L (ref 0–44)
AST: 22 U/L (ref 15–41)
Albumin: 3.6 g/dL (ref 3.5–5.0)
Alkaline Phosphatase: 66 U/L (ref 38–126)
Anion gap: 8 (ref 5–15)
BUN: 11 mg/dL (ref 8–23)
CO2: 28 mmol/L (ref 22–32)
Calcium: 9 mg/dL (ref 8.9–10.3)
Chloride: 104 mmol/L (ref 98–111)
Creatinine: 1.06 mg/dL (ref 0.61–1.24)
GFR, Est AFR Am: >60 mL/min (ref >60)
GFR, Estimated: >60 mL/min (ref >60)
Glucose, Bld: 98 mg/dL (ref 70–99)
Potassium: 4.5 mmol/L (ref 3.5–5.1)
Sodium: 140 mmol/L (ref 135–145)
Total Bilirubin: 1.7 mg/dL — ABNORMAL HIGH (ref 0.3–1.2)
Total Protein: 7.2 g/dL (ref 6.5–8.1)

## 2018-03-02 LAB — CBC WITH DIFFERENTIAL/PLATELET
Abs Immature Granulocytes: 0.03 10*3/uL (ref 0.00–0.07)
Basophils Absolute: 0.1 10*3/uL (ref 0.0–0.1)
Basophils Relative: 1 %
Eosinophils Absolute: 0.2 10*3/uL (ref 0.0–0.5)
Eosinophils Relative: 3 %
HCT: 42.5 % (ref 39.0–52.0)
Hemoglobin: 14.2 g/dL (ref 13.0–17.0)
Immature Granulocytes: 1 %
Lymphocytes Relative: 33 %
Lymphs Abs: 2 10*3/uL (ref 0.7–4.0)
MCH: 31.8 pg (ref 26.0–34.0)
MCHC: 33.4 g/dL (ref 30.0–36.0)
MCV: 95.1 fL (ref 80.0–100.0)
Monocytes Absolute: 0.8 10*3/uL (ref 0.1–1.0)
Monocytes Relative: 13 %
Neutro Abs: 2.9 10*3/uL (ref 1.7–7.7)
Neutrophils Relative %: 49 %
Platelets: 192 10*3/uL (ref 150–400)
RBC: 4.47 MIL/uL (ref 4.22–5.81)
RDW: 12 % (ref 11.5–15.5)
WBC: 6 10*3/uL (ref 4.0–10.5)
nRBC: 0 % (ref 0.0–0.2)

## 2018-03-02 LAB — LACTATE DEHYDROGENASE: LDH: 140 U/L (ref 98–192)

## 2018-03-02 NOTE — Telephone Encounter (Signed)
Scheduled appt per 01/22 los.  Added patient first treatment to the book for approval.  Printed calendar and avs.

## 2018-03-02 NOTE — Patient Instructions (Signed)
Thank you for choosing [facility] to provide your oncology and hematology care.  To afford each patient quality time with our providers, please arrive 30 minutes before your scheduled appointment time.  If you arrive late for your appointment, you may be asked to reschedule.  We strive to give you quality time with our providers, and arriving late affects you and other patients whose appointments are after yours.    If you are a no show for multiple scheduled visits, you may be dismissed from the clinic at the providers discretion.     Again, thank you for choosing [facility], our hope is that these requests will decrease the amount of time that you wait before being seen by our physicians.  ______________________________________________________________________   Should you have questions after your visit to the [facility], please contact our office at [phone] between the hours of 8:30 and 4:30 p.m.    Voicemails left after 4:30p.m will not be returned until the following business day.     For prescription refill requests, please have your pharmacy contact us directly.  Please also try to allow 48 hours for prescription requests.     Please contact the scheduling department for questions regarding scheduling.  For scheduling of procedures such as PET scans, CT scans, MRI, Ultrasound, etc please contact central scheduling at [phone].     Resources For Cancer Patients and Caregivers:    Oncolink.org:  A wonderful resource for patients and healthcare providers for information regarding your disease, ways to tract your treatment, what to expect, etc.      American Cancer Society:  800-227-2345  Can help patients locate various types of support and financial assistance   Cancer Care: 1-800-813-HOPE (4673) Provides financial assistance, online support groups, medication/co-pay assistance.     [place] DSS:  336-641-3447 Where to apply  for food stamps, Medicaid, and utility assistance   Medicare Rights Center: 800-333-4114 Helps people with Medicare understand their rights and benefits, navigate the Medicare system, and secure the quality healthcare they deserve   SCAT: 336-333-6589 [place] [employer]'s shared-ride transportation service for eligible riders who have a disability that prevents them from riding the fixed route bus.     For additional information on assistance programs please contact our social worker:   Abigail Elmore:  [phone]  

## 2018-03-03 ENCOUNTER — Telehealth: Payer: Self-pay | Admitting: Hematology

## 2018-03-03 DIAGNOSIS — C8339 Diffuse large B-cell lymphoma, extranodal and solid organ sites: Secondary | ICD-10-CM | POA: Insufficient documentation

## 2018-03-03 MED ORDER — ONDANSETRON HCL 8 MG PO TABS: 8.0000 mg | ORAL_TABLET | Freq: Two times a day (BID) | ORAL | 1 refills | Status: DC | PRN

## 2018-03-03 MED ORDER — PREDNISONE 20 MG PO TABS: 60.0000 mg | ORAL_TABLET | Freq: Every day | ORAL | 5 refills | Status: DC

## 2018-03-03 MED ORDER — PROCHLORPERAZINE MALEATE 10 MG PO TABS: 10.0000 mg | ORAL_TABLET | Freq: Four times a day (QID) | ORAL | 6 refills | Status: DC | PRN

## 2018-03-03 NOTE — Telephone Encounter (Signed)
Called patient to let the patient know his treatment has been added for 01/29.  Patient and his daughter is aware of the appt.

## 2018-03-03 NOTE — Progress Notes (Signed)
START ON PATHWAY REGIMEN - Lymphoma and CLL     A cycle is every 21 days:     Prednisone      Rituximab      Cyclophosphamide      Doxorubicin      Vincristine   **Always confirm dose/schedule in your pharmacy ordering system**  Patient Characteristics: Diffuse Large B-Cell Lymphoma, First Line, Stage I and II, No Bulk Disease Type: Not Applicable Disease Type: Diffuse Large B-Cell Lymphoma Disease Type: Not Applicable Line of therapy: First Line [place] Stage: IE Disease Characteristics: No Bulk Intent of Therapy: Curative Intent, Discussed with Patient 

## 2018-03-04 ENCOUNTER — Inpatient Hospital Stay: Payer: Medicare HMO

## 2018-03-08 ENCOUNTER — Telehealth: Payer: Self-pay | Admitting: *Deleted

## 2018-03-08 ENCOUNTER — Other Ambulatory Visit: Payer: Self-pay | Admitting: *Deleted

## 2018-03-08 DIAGNOSIS — C8339 Diffuse large B-cell lymphoma, extranodal and solid organ sites: Secondary | ICD-10-CM

## 2018-03-08 NOTE — Telephone Encounter (Signed)
Contacted patient, spoke with daughter Carl [name] as patient not home. Gave her new schedule time for infusion on 2/4 8:30. For 2/6: Lab 8:15, followed by port flush and MD appt at 9 and injection at 10. Daughter verbalized understanding of changes and stated they will not come tomorrow or Friday.

## 2018-03-09 ENCOUNTER — Ambulatory Visit: Payer: Medicare HMO

## 2018-03-11 ENCOUNTER — Ambulatory Visit: Payer: Medicare HMO

## 2018-03-11 ENCOUNTER — Other Ambulatory Visit: Payer: Self-pay | Admitting: Radiology

## 2018-03-14 ENCOUNTER — Ambulatory Visit (HOSPITAL_COMMUNITY)
Admission: RE | Admit: 2018-03-14 | Discharge: 2018-03-14 | Disposition: A | Discharge status: Home / Self Care | Payer: Medicare HMO | Source: Ambulatory Visit | Attending: Hematology | Admitting: Hematology

## 2018-03-14 ENCOUNTER — Other Ambulatory Visit: Payer: Self-pay

## 2018-03-14 ENCOUNTER — Encounter (HOSPITAL_COMMUNITY): Payer: Self-pay

## 2018-03-14 DIAGNOSIS — I255 Ischemic cardiomyopathy: Secondary | ICD-10-CM | POA: Diagnosis not present

## 2018-03-14 DIAGNOSIS — E785 Hyperlipidemia, unspecified: Secondary | ICD-10-CM | POA: Diagnosis not present

## 2018-03-14 DIAGNOSIS — Z7982 Long term (current) use of aspirin: Secondary | ICD-10-CM | POA: Diagnosis not present

## 2018-03-14 DIAGNOSIS — I10 Essential (primary) hypertension: Secondary | ICD-10-CM | POA: Insufficient documentation

## 2018-03-14 DIAGNOSIS — M1990 Unspecified osteoarthritis, unspecified site: Secondary | ICD-10-CM | POA: Diagnosis not present

## 2018-03-14 DIAGNOSIS — C8339 Diffuse large B-cell lymphoma, extranodal and solid organ sites: Secondary | ICD-10-CM | POA: Insufficient documentation

## 2018-03-14 DIAGNOSIS — Z79899 Other long term (current) drug therapy: Secondary | ICD-10-CM | POA: Insufficient documentation

## 2018-03-14 DIAGNOSIS — I2510 Atherosclerotic heart disease of native coronary artery without angina pectoris: Secondary | ICD-10-CM | POA: Diagnosis not present

## 2018-03-14 DIAGNOSIS — I252 Old myocardial infarction: Secondary | ICD-10-CM | POA: Insufficient documentation

## 2018-03-14 DIAGNOSIS — M25511 Pain in right shoulder: Secondary | ICD-10-CM | POA: Insufficient documentation

## 2018-03-14 DIAGNOSIS — Z452 Encounter for adjustment and management of vascular access device: Secondary | ICD-10-CM | POA: Diagnosis not present

## 2018-03-14 DIAGNOSIS — C8330 Diffuse large B-cell lymphoma, unspecified site: Secondary | ICD-10-CM | POA: Diagnosis not present

## 2018-03-14 DIAGNOSIS — Z5111 Encounter for antineoplastic chemotherapy: Secondary | ICD-10-CM | POA: Diagnosis not present

## 2018-03-14 HISTORY — PX: IR IMAGING GUIDED PORT INSERTION: IMG5740

## 2018-03-14 LAB — COMPREHENSIVE METABOLIC PANEL
ALT: 28 U/L (ref 0–44)
AST: 28 U/L (ref 15–41)
Albumin: 4 g/dL (ref 3.5–5.0)
Alkaline Phosphatase: 51 U/L (ref 38–126)
Anion gap: 6 (ref 5–15)
BUN: 16 mg/dL (ref 8–23)
CO2: 26 mmol/L (ref 22–32)
Calcium: 9 mg/dL (ref 8.9–10.3)
Chloride: 107 mmol/L (ref 98–111)
Creatinine, Ser: 1.06 mg/dL (ref 0.61–1.24)
GFR calc Af Amer: >60 mL/min (ref >60)
GFR calc non Af Amer: >60 mL/min (ref >60)
Glucose, Bld: 100 mg/dL — ABNORMAL HIGH (ref 70–99)
Potassium: 4.1 mmol/L (ref 3.5–5.1)
Sodium: 139 mmol/L (ref 135–145)
Total Bilirubin: 2 mg/dL — ABNORMAL HIGH (ref 0.3–1.2)
Total Protein: 8 g/dL (ref 6.5–8.1)

## 2018-03-14 LAB — CBC WITH DIFFERENTIAL/PLATELET
Abs Immature Granulocytes: 0.03 10*3/uL (ref 0.00–0.07)
Basophils Absolute: 0 10*3/uL (ref 0.0–0.1)
Basophils Relative: 1 %
Eosinophils Absolute: 0.2 10*3/uL (ref 0.0–0.5)
Eosinophils Relative: 3 %
HCT: 42.8 % (ref 39.0–52.0)
Hemoglobin: 13.8 g/dL (ref 13.0–17.0)
Immature Granulocytes: 1 %
Lymphocytes Relative: 33 %
Lymphs Abs: 2 10*3/uL (ref 0.7–4.0)
MCH: 31.7 pg (ref 26.0–34.0)
MCHC: 32.2 g/dL (ref 30.0–36.0)
MCV: 98.4 fL (ref 80.0–100.0)
Monocytes Absolute: 0.8 10*3/uL (ref 0.1–1.0)
Monocytes Relative: 13 %
Neutro Abs: 3.1 10*3/uL (ref 1.7–7.7)
Neutrophils Relative %: 49 %
Platelets: 155 10*3/uL (ref 150–400)
RBC: 4.35 MIL/uL (ref 4.22–5.81)
RDW: 12.3 % (ref 11.5–15.5)
WBC: 6.2 10*3/uL (ref 4.0–10.5)
nRBC: 0 % (ref 0.0–0.2)

## 2018-03-14 LAB — PROTIME-INR
INR: 0.94
Prothrombin Time: 12.5 seconds (ref 11.4–15.2)

## 2018-03-14 MED ORDER — HEPARIN SOD (PORK) LOCK FLUSH 100 UNIT/ML IV SOLN
INTRAVENOUS | Status: AC | PRN
  Administered 2018-03-14: 500 [IU]

## 2018-03-14 MED ORDER — HEPARIN SOD (PORK) LOCK FLUSH 100 UNIT/ML IV SOLN
INTRAVENOUS | Status: AC
  Filled 2018-03-14: qty 5

## 2018-03-14 MED ORDER — FENTANYL CITRATE (PF) 100 MCG/2ML IJ SOLN
INTRAMUSCULAR | Status: AC
  Filled 2018-03-14: qty 2

## 2018-03-14 MED ORDER — LIDOCAINE HCL 1 % IJ SOLN
INTRAMUSCULAR | Status: AC
  Filled 2018-03-14: qty 20

## 2018-03-14 MED ORDER — CEFAZOLIN SODIUM-DEXTROSE 2-4 GM/100ML-% IV SOLN
2.0000 g | INTRAVENOUS | Status: AC
  Administered 2018-03-14: 2 g via INTRAVENOUS

## 2018-03-14 MED ORDER — MIDAZOLAM HCL 2 MG/2ML IJ SOLN
INTRAMUSCULAR | Status: AC
  Filled 2018-03-14: qty 2

## 2018-03-14 MED ORDER — FENTANYL CITRATE (PF) 100 MCG/2ML IJ SOLN
INTRAMUSCULAR | Status: AC | PRN
  Administered 2018-03-14: 50 ug via INTRAVENOUS

## 2018-03-14 MED ORDER — CEFAZOLIN SODIUM-DEXTROSE 2-4 GM/100ML-% IV SOLN
INTRAVENOUS | Status: AC
  Administered 2018-03-14: 2 g via INTRAVENOUS
  Filled 2018-03-14: qty 100

## 2018-03-14 MED ORDER — SODIUM CHLORIDE 0.9 % IV SOLN
INTRAVENOUS | Status: DC
  Administered 2018-03-14: 13:00:00 via INTRAVENOUS

## 2018-03-14 MED ORDER — MIDAZOLAM HCL 2 MG/2ML IJ SOLN
INTRAMUSCULAR | Status: AC | PRN
  Administered 2018-03-14: 1 mg via INTRAVENOUS
  Administered 2018-03-14 (×2): 0.5 mg via INTRAVENOUS

## 2018-03-14 MED ORDER — LIDOCAINE HCL (PF) 1 % IJ SOLN
INTRAMUSCULAR | Status: AC | PRN
  Administered 2018-03-14 (×2): 10 mL

## 2018-03-14 NOTE — Procedures (Signed)
Interventional Radiology Procedure Note  Procedure: Placement of a right IJ approach single lumen PowerPort.  Tip is positioned at the superior cavoatrial junction and catheter is ready for immediate use.  Complications: None Recommendations:  - Ok to shower tomorrow - Do not submerge for 7 days - Routine line care   Signed,  Rajvir Ernster S. Shamecca Whitebread, DO   

## 2018-03-14 NOTE — Discharge Instructions (Signed)
Please keep dressing on for 24 hours.   DO NOT USE Emla cream or lotion over site until all glue and steri-strips have come off on their own.   Implanted Port Insertion, Care After This sheet gives you information about how to care for yourself after your procedure. Your health care provider may also give you more specific instructions. If you have problems or questions, contact your health care provider. What can I expect after the procedure? After the procedure, it is common to have:  Discomfort at the port insertion site.  Bruising on the skin over the port. This should improve over 3-4 days. Follow these instructions at home: [facility] care  After your port is placed, you will get a manufacturer's information card. The card has information about your port. Keep this card with you at all times.  Take care of the port as told by your health care provider. Ask your health care provider if you or a family member can get training for taking care of the port at home. A home health care nurse may also take care of the port.  Make sure to remember what type of port you have. Incision care      Follow instructions from your health care provider about how to take care of your port insertion site. Make sure you: ? Wash your hands with soap and water before and after you change your bandage (dressing). If soap and water are not available, use hand sanitizer. ? Change your dressing as told by your health care provider. ? Leave stitches (sutures), skin glue, or adhesive strips in place. These skin closures may need to stay in place for 2 weeks or longer. If adhesive strip edges start to loosen and curl up, you may trim the loose edges. Do not remove adhesive strips completely unless your health care provider tells you to do that.  Check your port insertion site every day for signs of infection. Check for: ? Redness, swelling, or pain. ? Fluid or blood. ? Warmth. ? Pus or a bad  smell. Activity  Return to your normal activities as told by your health care provider. Ask your health care provider what activities are safe for you.  Do not lift anything that is heavier than 10 lb (4.5 kg), or the limit that you are told, until your health care provider says that it is safe. General instructions  Take over-the-counter and prescription medicines only as told by your health care provider.  Do not take baths, swim, or use a hot tub until your health care provider approves. Ask your health care provider if you may take showers. You may only be allowed to take sponge baths.  Do not drive for 24 hours if you were given a sedative during your procedure.  Wear a medical alert bracelet in case of an emergency. This will tell any health care providers that you have a port.  Keep all follow-up visits as told by your health care provider. This is important. Contact a health care provider if:  You cannot flush your port with saline as directed, or you cannot draw blood from the port.  You have a fever or chills.  You have redness, swelling, or pain around your port insertion site.  You have fluid or blood coming from your port insertion site.  Your port insertion site feels warm to the touch.  You have pus or a bad smell coming from the port insertion site. Get help right away if:  You have chest pain or shortness of breath.  You have bleeding from your port that you cannot control. Summary  Take care of the port as told by your health care provider. Keep the manufacturer's information card with you at all times.  Change your dressing as told by your health care provider.  Contact a health care provider if you have a fever or chills or if you have redness, swelling, or pain around your port insertion site.  Keep all follow-up visits as told by your health care provider. This information is not intended to replace advice given to you by your health care provider.  Make sure you discuss any questions you have with your health care provider. Document Released: 11/16/2012 Document Revised: 08/24/2017 Document Reviewed: 08/24/2017 Elsevier Interactive Patient Education  2019 [place].    Moderate Conscious Sedation, Adult, Care After These instructions provide you with information about caring for yourself after your procedure. Your health care provider may also give you more specific instructions. Your treatment has been planned according to current medical practices, but problems sometimes occur. Call your health care provider if you have any problems or questions after your procedure. What can I expect after the procedure? After your procedure, it is common:  To feel sleepy for several hours.  To feel clumsy and have poor balance for several hours.  To have poor judgment for several hours.  To vomit if you eat too soon. Follow these instructions at home: For at least 24 hours after the procedure:   Do not: ? Participate in activities where you could fall or become injured. ? Drive. ? Use heavy machinery. ? Drink alcohol. ? Take sleeping pills or medicines that cause drowsiness. ? Make important decisions or sign legal documents. ? Take care of children on your own.  Rest. Eating and drinking  Follow the diet recommended by your health care provider.  If you vomit: ? Drink water, juice, or soup when you can drink without vomiting. ? Make sure you have little or no nausea before eating solid foods. General instructions  Have a responsible adult stay with you until you are awake and alert.  Take over-the-counter and prescription medicines only as told by your health care provider.  If you smoke, do not smoke without supervision.  Keep all follow-up visits as told by your health care provider. This is important. Contact a health care provider if:  You keep feeling nauseous or you keep vomiting.  You feel light-headed.  You  develop a rash.  You have a fever. Get help right away if:  You have trouble breathing. This information is not intended to replace advice given to you by your health care provider. Make sure you discuss any questions you have with your health care provider. Document Released: 11/16/2012 Document Revised: 07/01/2015 Document Reviewed: 05/18/2015 Elsevier Interactive Patient Education  2019 [employer].

## 2018-03-14 NOTE — H&P (Signed)
Chief Complaint: Patient was seen in consultation today for port placement.   Referring Physician(s): Brunetta Genera  Supervising Physician: Corrie Mckusick  Patient Status: [facility] - Out-pt  History of Present Illness: Carl [name] is a 78 y.o. male with a past medical history significant for arthritis, CAD, MI s/p stenting (2016), HTN, HLD and primary testicular diffuse large B-cell lymphoma and  followed by Dr. Irene Limbo who presents today for port placement. He was last seen in Dr. Grier Mitts office on 03/02/18 to discuss routine lab work/imaging as well as tumor board recommendations regarding his disease - it was felt that he would benefit from chemotherapy as well as radiation to both of his testicles which he has agreed to proceed with.   Patient reports he has some right shoulder pain however this is chronic and feels about the same as usual. He also reports recent weight gain. He denies any other complaints. He states understanding of procedure and wishes to proceed.   Past Medical History:  Diagnosis Date  . Arthritis   . Coronary artery disease   . HOH (hard of hearing)   . Hyperlipidemia   . Hypertension   . Ischemic cardiomyopathy   . Myocardial infarction ([place]) 2016  . Quadriceps tendon rupture    LEFT    Past Surgical History:  Procedure Laterality Date  . CARDIAC CATHETERIZATION N/A 10/15/2014   Procedure: Left Heart Cath and Coronary Angiography;  Surgeon: Charolette Forward, MD;  Location: [place] [facility];  Service: Cardiovascular;  Laterality: N/A;  . COLONOSCOPY    . MICROLARYNGOSCOPY WITH CO2 LASER AND EXCISION OF VOCAL CORD LESION N/A 11/26/2017   Procedure: MICROLARYNGOSCOPY WITH CO2 LASER AND EXCISION OF VOCAL CORD LESION WITH JET VENTILATION;  Surgeon: Melida Quitter, MD;  Location: [place];  Service: ENT;  Laterality: N/A;  . QUADRICEPS TENDON REPAIR Left 02/20/2014   Procedure: LEFT REPAIR QUADRICEP TENDON;  Surgeon: Mauri Pole, MD;  Location: [facility];   Service: Orthopedics;  Laterality: Left;  . THROAT SURGERY  2010    Allergies: Patient has no known allergies.  Medications: Prior to Admission medications   Medication Sig Start Date End Date Taking? Authorizing Provider  aspirin EC 81 MG tablet Take 81 mg by mouth daily.   Yes [provider]  atorvastatin (LIPITOR) 40 MG tablet Take 1 tablet (40 mg total) by mouth daily at 6 PM. 10/19/14  Yes Charolette Forward, MD  carvedilol (COREG) 6.25 MG tablet Take 6.25 mg by mouth 2 (two) times daily with a meal.   Yes [provider]  cholecalciferol (VITAMIN D) 1000 UNITS tablet Take 1,000 Units by mouth daily.   Yes [provider]  losartan (COZAAR) 25 MG tablet Take 25 mg by mouth every evening.   Yes [provider]  Multiple Vitamin (MULTIVITAMIN WITH MINERALS) TABS tablet Take 1 tablet by mouth daily.   Yes [provider]  Polyethyl Glycol-Propyl Glycol (SYSTANE OP) Place 1 drop into both eyes at bedtime.    Yes [provider]  nitroGLYCERIN (NITROSTAT) 0.4 MG SL tablet Place 1 tablet (0.4 mg total) under the tongue every 5 (five) minutes x 3 doses as needed for chest pain. 10/19/14   Charolette Forward, MD  ondansetron (ZOFRAN) 8 MG tablet Take 1 tablet (8 mg total) by mouth 2 (two) times daily as needed for refractory nausea / vomiting. Start on day 3 after cyclophosphamide chemotherapy. 03/03/18   Brunetta Genera, MD  predniSONE (DELTASONE) 20 MG tablet Take  3 tablets (60 mg total) by mouth daily. Take on days 1-5 of chemotherapy. 03/03/18   Brunetta Genera, MD  prochlorperazine (COMPAZINE) 10 MG tablet Take 1 tablet (10 mg total) by mouth every 6 (six) hours as needed (Nausea or vomiting). 03/03/18   Brunetta Genera, MD  white petrolatum (VASELINE) GEL Apply 1 application topically as needed for dry skin (itching).    [provider]     History reviewed. No pertinent family history.  Social History   Socioeconomic  History  . Marital status: Divorced    Spouse name: Not on file  . Number of children: Not on file  . Years of education: Not on file  . Highest education level: Not on file  Occupational History  . Not on file  Social Needs  . Financial resource strain: Not on file  . Food insecurity:    Worry: Not on file    Inability: Not on file  . Transportation needs:    Medical: Not on file    Non-medical: Not on file  Tobacco Use  . Smoking status: Never Smoker  . Smokeless tobacco: Never Used  Substance and Sexual Activity  . Alcohol use: Yes    Comment: OCCASIONAL  . Drug use: No  . Sexual activity: Not on file  Lifestyle  . Physical activity:    Days per week: Not on file    Minutes per session: Not on file  . Stress: Not on file  Relationships  . Social connections:    Talks on phone: Not on file    Gets together: Not on file    Attends religious service: Not on file    Active member of club or organization: Not on file    Attends meetings of clubs or organizations: Not on file    Relationship status: Not on file  Other Topics Concern  . Not on file  Social History Narrative  . Not on file     Review of Systems: A 12 point ROS discussed and pertinent positives are indicated in the HPI above.  All other systems are negative.  Review of Systems  Constitutional: Negative for appetite change, chills and fever.  HENT: Positive for hearing loss.   Respiratory: Negative for cough and shortness of breath.   Cardiovascular: Negative for chest pain.  Gastrointestinal: Negative for abdominal pain, blood in stool, constipation, diarrhea, nausea and vomiting.  Genitourinary: Negative for dysuria and hematuria.  Neurological: Negative for dizziness, syncope and headaches.  Psychiatric/Behavioral: Negative for confusion.    Vital Signs: BP 119/75 (BP Location: Right Arm)   Pulse (!) 59   Temp 97.8 F (36.6 C) (Oral)   Resp 18   SpO2 99%   Physical Exam Vitals signs  reviewed.  Constitutional:      General: He is not in acute distress.    Comments: Daughter at bedside. Pleasant, hard of hearing - wears hearing aids.  HENT:     Head: Normocephalic.  Cardiovascular:     Rate and Rhythm: Normal rate and regular rhythm.  Pulmonary:     Effort: Pulmonary effort is normal.     Breath sounds: Normal breath sounds.  Abdominal:     General: There is no distension.     Palpations: Abdomen is soft.     Tenderness: There is no abdominal tenderness.  Skin:    General: Skin is warm and dry.  Neurological:     Mental Status: He is alert and oriented to person, place,  and time.     Comments: Speech appears somewhat slowed  Psychiatric:        Mood and Affect: Mood normal.        Behavior: Behavior normal.        Thought Content: Thought content normal.        Judgment: Judgment normal.      MD Evaluation Airway: WNL(partial upper denture) Heart: WNL Abdomen: WNL Chest/ Lungs: WNL ASA  Classification: 3 Mallampati/Airway Score: Two   Imaging: No results found.  Labs:  CBC: Recent Labs    11/26/17 0910 01/25/18 1435 03/02/18 1351  WBC  --  6.7 6.0  HGB 13.6 14.8 14.2  HCT  --  45.0 42.5  PLT  --  210 192    COAGS: No results for input(s): INR, APTT in the last 8760 hours.  BMP: Recent Labs    11/26/17 0910 01/25/18 1435 03/02/18 1351  NA 138 141 140  K 3.6 4.3 4.5  CL 107 108 104  CO2 27 26 28   GLUCOSE 104* 96 98  BUN 17 12 11   CALCIUM 8.7* 9.3 9.0  CREATININE 1.30* 0.97 1.06  GFRNONAA 51* >60 >60  GFRAA 59* >60 >60    LIVER FUNCTION TESTS: Recent Labs    01/25/18 1435 03/02/18 1351  BILITOT 1.5* 1.7*  AST 18 22  ALT 21 21  ALKPHOS 75 66  PROT 7.7 7.2  ALBUMIN 3.7 3.6    TUMOR MARKERS: No results for input(s): AFPTM, CEA, CA199, CHROMGRNA in the last 8760 hours.  Assessment and Plan:  Patient with history of primary testicular large b-cell lymphoma followed by Dr. Irene Limbo who requests port placement today  in order for patient to begin chemotherapy.  Patient reports he ate a yogurt at 7:15 am and had 6-8 oz of water at 10 am, he does not take blood thinning medications. Afebrile, WBC 6.2, hgb 13.8, plt 155, INR and creatinine pending at time of this note writing.  Risks and benefits of image guided port-a-catheter placement was discussed with the patient including, but not limited to bleeding, infection, pneumothorax, or fibrin sheath development and need for additional procedures.  All of the patient's questions were answered, patient is agreeable to proceed.  Consent signed and in chart.  Thank you for this interesting consult.  I greatly enjoyed meeting EMRICK HENSCH and look forward to participating in their care.  A copy of this report was sent to the requesting provider on this date.  Electronically Signed: Joaquim Nam, PA-C 03/14/2018, 1:21 PM   I spent a total of  30 Minutes   in face to face in clinical consultation, greater than 50% of which was counseling/coordinating care for port placement.

## 2018-03-15 ENCOUNTER — Encounter: Payer: Self-pay | Admitting: Hematology

## 2018-03-15 ENCOUNTER — Inpatient Hospital Stay: Payer: Medicare HMO | Attending: Hematology

## 2018-03-15 ENCOUNTER — Other Ambulatory Visit: Payer: Self-pay | Admitting: Hematology

## 2018-03-15 VITALS — BP 106/68 | HR 63 | Temp 97.6°F | Resp 16 | Wt 238.2 lb

## 2018-03-15 DIAGNOSIS — Z955 Presence of coronary angioplasty implant and graft: Secondary | ICD-10-CM | POA: Diagnosis not present

## 2018-03-15 DIAGNOSIS — I10 Essential (primary) hypertension: Secondary | ICD-10-CM | POA: Insufficient documentation

## 2018-03-15 DIAGNOSIS — Z5189 Encounter for other specified aftercare: Secondary | ICD-10-CM | POA: Insufficient documentation

## 2018-03-15 DIAGNOSIS — I2510 Atherosclerotic heart disease of native coronary artery without angina pectoris: Secondary | ICD-10-CM | POA: Insufficient documentation

## 2018-03-15 DIAGNOSIS — I252 Old myocardial infarction: Secondary | ICD-10-CM | POA: Insufficient documentation

## 2018-03-15 DIAGNOSIS — Z7982 Long term (current) use of aspirin: Secondary | ICD-10-CM | POA: Diagnosis not present

## 2018-03-15 DIAGNOSIS — R140 Abdominal distension (gaseous): Secondary | ICD-10-CM | POA: Diagnosis not present

## 2018-03-15 DIAGNOSIS — Z5112 Encounter for antineoplastic immunotherapy: Secondary | ICD-10-CM | POA: Insufficient documentation

## 2018-03-15 DIAGNOSIS — R197 Diarrhea, unspecified: Secondary | ICD-10-CM | POA: Diagnosis not present

## 2018-03-15 DIAGNOSIS — Z5111 Encounter for antineoplastic chemotherapy: Secondary | ICD-10-CM | POA: Insufficient documentation

## 2018-03-15 DIAGNOSIS — C8335 Diffuse large B-cell lymphoma, lymph nodes of inguinal region and lower limb: Secondary | ICD-10-CM | POA: Insufficient documentation

## 2018-03-15 DIAGNOSIS — Z79899 Other long term (current) drug therapy: Secondary | ICD-10-CM | POA: Insufficient documentation

## 2018-03-15 DIAGNOSIS — K5900 Constipation, unspecified: Secondary | ICD-10-CM | POA: Insufficient documentation

## 2018-03-15 DIAGNOSIS — R490 Dysphonia: Secondary | ICD-10-CM | POA: Insufficient documentation

## 2018-03-15 DIAGNOSIS — C8339 Diffuse large B-cell lymphoma, extranodal and solid organ sites: Secondary | ICD-10-CM

## 2018-03-15 MED ORDER — SODIUM CHLORIDE 0.9 % IV SOLN
400.0000 mg/m2 | Freq: Once | INTRAVENOUS | Status: AC
  Administered 2018-03-15: 940 mg via INTRAVENOUS
  Filled 2018-03-15: qty 47

## 2018-03-15 MED ORDER — SODIUM CHLORIDE 0.9 % IV SOLN
375.0000 mg/m2 | Freq: Once | INTRAVENOUS | Status: AC
  Administered 2018-03-15: 900 mg via INTRAVENOUS
  Filled 2018-03-15: qty 90

## 2018-03-15 MED ORDER — ACETAMINOPHEN 325 MG PO TABS
650.0000 mg | ORAL_TABLET | Freq: Once | ORAL | Status: AC
  Administered 2018-03-15: 650 mg via ORAL

## 2018-03-15 MED ORDER — DOXORUBICIN HCL CHEMO IV INJECTION 2 MG/ML
25.0000 mg/m2 | Freq: Once | INTRAVENOUS | Status: AC
  Administered 2018-03-15: 58 mg via INTRAVENOUS
  Filled 2018-03-15: qty 29

## 2018-03-15 MED ORDER — ACETAMINOPHEN 325 MG PO TABS
ORAL_TABLET | ORAL | Status: AC
  Filled 2018-03-15: qty 2

## 2018-03-15 MED ORDER — SODIUM CHLORIDE 0.9 % IV SOLN
Freq: Once | INTRAVENOUS | Status: AC
  Administered 2018-03-15: 10:00:00 via INTRAVENOUS
  Filled 2018-03-15: qty 5

## 2018-03-15 MED ORDER — PALONOSETRON HCL INJECTION 0.25 MG/5ML
INTRAVENOUS | Status: AC
  Filled 2018-03-15: qty 5

## 2018-03-15 MED ORDER — SODIUM CHLORIDE 0.9% FLUSH
10.0000 mL | INTRAVENOUS | Status: DC | PRN
  Administered 2018-03-15: 10 mL
  Filled 2018-03-15: qty 10

## 2018-03-15 MED ORDER — PALONOSETRON HCL INJECTION 0.25 MG/5ML
0.2500 mg | Freq: Once | INTRAVENOUS | Status: AC
  Administered 2018-03-15: 0.25 mg via INTRAVENOUS

## 2018-03-15 MED ORDER — DIPHENHYDRAMINE HCL 25 MG PO CAPS
ORAL_CAPSULE | ORAL | Status: AC
  Filled 2018-03-15: qty 2

## 2018-03-15 MED ORDER — VINCRISTINE SULFATE CHEMO INJECTION 1 MG/ML
1.0000 mg | Freq: Once | INTRAVENOUS | Status: AC
  Administered 2018-03-15: 1 mg via INTRAVENOUS
  Filled 2018-03-15: qty 1

## 2018-03-15 MED ORDER — SODIUM CHLORIDE 0.9 % IV SOLN
Freq: Once | INTRAVENOUS | Status: AC
  Administered 2018-03-15: 09:00:00 via INTRAVENOUS
  Filled 2018-03-15: qty 250

## 2018-03-15 MED ORDER — HEPARIN SOD (PORK) LOCK FLUSH 100 UNIT/ML IV SOLN
500.0000 [IU] | Freq: Once | INTRAVENOUS | Status: AC | PRN
  Administered 2018-03-15: 500 [IU]
  Filled 2018-03-15: qty 5

## 2018-03-15 MED ORDER — DIPHENHYDRAMINE HCL 25 MG PO CAPS
50.0000 mg | ORAL_CAPSULE | Freq: Once | ORAL | Status: AC
  Administered 2018-03-15: 50 mg via ORAL

## 2018-03-15 NOTE — Patient Instructions (Signed)
[facility] Discharge Instructions for Patients Receiving Chemotherapy  Today you received the following chemotherapy agents: Adriamycin (Doxorubicin), Vincristine (Oncovin), Cyclophosphamide (Cytoxan), Rituximab (Rituxan)  To help prevent nausea and vomiting after your treatment, we encourage you to take your nausea medication as directed. Received Aloxi during treatment today-->Take your Compazine prescription (not Zofran) for the next 3 days as needed.   If you develop nausea and vomiting that is not controlled by your nausea medication, call the clinic.   BELOW ARE SYMPTOMS THAT SHOULD BE REPORTED IMMEDIATELY:  *FEVER GREATER THAN 100.5 F  *CHILLS WITH OR WITHOUT FEVER  NAUSEA AND VOMITING THAT IS NOT CONTROLLED WITH YOUR NAUSEA MEDICATION  *UNUSUAL SHORTNESS OF BREATH  *UNUSUAL BRUISING OR BLEEDING  TENDERNESS IN MOUTH AND THROAT WITH OR WITHOUT PRESENCE OF ULCERS  *URINARY PROBLEMS  *BOWEL PROBLEMS  UNUSUAL RASH Items with * indicate a potential emergency and should be followed up as soon as possible.  Feel free to call the clinic should you have any questions or concerns. The clinic phone number is [phone].  Please show the CHEMO ALERT CARD at check-in to the Emergency Department and triage nurse.  Doxorubicin injection What is this medicine? DOXORUBICIN (dox oh ROO bi sin) is a chemotherapy drug. It is used to treat many kinds of cancer like leukemia, lymphoma, neuroblastoma, sarcoma, and Wilms' tumor. It is also used to treat bladder cancer, breast cancer, lung cancer, ovarian cancer, stomach cancer, and thyroid cancer. This medicine may be used for other purposes; ask your health care provider or pharmacist if you have questions. COMMON BRAND NAME(S): Adriamycin, Adriamycin PFS, Adriamycin RDF, Rubex What should I tell my health care provider before I take this medicine? They need to know if you have any of these conditions: -heart  disease -history of low blood counts caused by a medicine -liver disease -recent or ongoing radiation therapy -an unusual or allergic reaction to doxorubicin, other chemotherapy agents, other medicines, foods, dyes, or preservatives -pregnant or trying to get pregnant -breast-feeding How should I use this medicine? This drug is given as an infusion into a vein. It is administered in a hospital or clinic by a specially trained health care professional. If you have pain, swelling, burning or any unusual feeling around the site of your injection, tell your health care professional right away. Talk to your pediatrician regarding the use of this medicine in children. Special care may be needed. Overdosage: If you think you have taken too much of this medicine contact a poison control center or emergency room at once. NOTE: This medicine is only for you. Do not share this medicine with others. What if I miss a dose? It is important not to miss your dose. Call your doctor or health care professional if you are unable to keep an appointment. What may interact with this medicine? This medicine may interact with the following medications: -6-mercaptopurine -paclitaxel -phenytoin -St. John's Wort -trastuzumab -verapamil This list may not describe all possible interactions. Give your health care provider a list of all the medicines, herbs, non-prescription drugs, or dietary supplements you use. Also tell them if you smoke, drink alcohol, or use illegal drugs. Some items may interact with your medicine. What should I watch for while using this medicine? This drug may make you feel generally unwell. This is not uncommon, as chemotherapy can affect healthy cells as well as cancer cells. Report any side effects. Continue your course of treatment even though you feel ill unless your doctor   tells you to stop. There is a maximum amount of this medicine you should receive throughout your life. The amount  depends on the medical condition being treated and your overall health. Your doctor will watch how much of this medicine you receive in your lifetime. Tell your doctor if you have taken this medicine before. You may need blood work done while you are taking this medicine. Your urine may turn red for a few days after your dose. This is not blood. If your urine is dark or brown, call your doctor. In some cases, you may be given additional medicines to help with side effects. Follow all directions for their use. Call your doctor or health care professional for advice if you get a fever, chills or sore throat, or other symptoms of a cold or flu. Do not treat yourself. This drug decreases your body's ability to fight infections. Try to avoid being around people who are sick. This medicine may increase your risk to bruise or bleed. Call your doctor or health care professional if you notice any unusual bleeding. Talk to your doctor about your risk of cancer. You may be more at risk for certain types of cancers if you take this medicine. Do not become pregnant while taking this medicine or for 6 months after stopping it. Women should inform their doctor if they wish to become pregnant or think they might be pregnant. Men should not father a child while taking this medicine and for 6 months after stopping it. There is a potential for serious side effects to an unborn child. Talk to your health care professional or pharmacist for more information. Do not breast-feed an infant while taking this medicine. This medicine has caused ovarian failure in some women and reduced sperm counts in some men This medicine may interfere with the ability to have a child. Talk with your doctor or health care professional if you are concerned about your fertility. This medicine may cause a decrease in Co-Enzyme Q-10. You should make sure that you get enough Co-Enzyme Q-10 while you are taking this medicine. Discuss the foods you eat  and the vitamins you take with your health care professional. What side effects may I notice from receiving this medicine? Side effects that you should report to your doctor or health care professional as soon as possible: -allergic reactions like skin rash, itching or hives, swelling of the face, lips, or tongue -breathing problems -chest pain -fast or irregular heartbeat -low blood counts - this medicine may decrease the number of white blood cells, red blood cells and platelets. You may be at increased risk for infections and bleeding. -pain, redness, or irritation at site where injected -signs of infection - fever or chills, cough, sore throat, pain or difficulty passing urine -signs of decreased platelets or bleeding - bruising, pinpoint red spots on the skin, black, tarry stools, blood in the urine -swelling of the ankles, feet, hands -tiredness -weakness Side effects that usually do not require medical attention (report to your doctor or health care professional if they continue or are bothersome): -diarrhea -hair loss -mouth sores -nail discoloration or damage -nausea -red colored urine -vomiting This list may not describe all possible side effects. Call your doctor for medical advice about side effects. You may report side effects to FDA at 1-800-FDA-1088. Where should I keep my medicine? This drug is given in a hospital or clinic and will not be stored at home. NOTE: This sheet is a summary. It may not   cover all possible information. If you have questions about this medicine, talk to your doctor, pharmacist, or health care provider.  2019 Elsevier/Gold Standard (2016-09-09 11:01:26)  Vincristine injection What is this medicine? VINCRISTINE (vin KRIS teen) is a chemotherapy drug. It slows the growth of cancer cells. This medicine is used to treat many types of cancer like Hodgkin's disease, leukemia, non-Hodgkin's lymphoma, neuroblastoma (brain cancer), rhabdomyosarcoma, and  Wilms' tumor. This medicine may be used for other purposes; ask your health care provider or pharmacist if you have questions. COMMON BRAND NAME(S): Oncovin, Vincasar PFS What should I tell my health care provider before I take this medicine? They need to know if you have any of these conditions: -blood disorders -gout -infection (especially chickenpox, cold sores, or herpes) -kidney disease -liver disease -lung disease -nervous system disease like Charcot-Marie-Tooth (CMT) -recent or ongoing radiation therapy -an unusual or allergic reaction to vincristine, other chemotherapy agents, other medicines, foods, dyes, or preservatives -pregnant or trying to get pregnant -breast-feeding How should I use this medicine? This drug is given as an infusion into a vein. It is administered in a hospital or clinic by a specially trained health care professional. If you have pain, swelling, burning, or any unusual feeling around the site of your injection, tell your health care professional right away. Talk to your pediatrician regarding the use of this medicine in children. While this drug may be prescribed for selected conditions, precautions do apply. Overdosage: If you think you have taken too much of this medicine contact a poison control center or emergency room at once. NOTE: This medicine is only for you. Do not share this medicine with others. What if I miss a dose? It is important not to miss your dose. Call your doctor or health care professional if you are unable to keep an appointment. What may interact with this medicine? Do not take this medicine with any of the following medications: -itraconazole -mibefradil -voriconazole This medicine may also interact with the following medications: -cyclosporine -erythromycin -fluconazole -ketoconazole -medicines for HIV like delavirdine, efavirenz, nevirapine -medicines for seizures like ethotoin, fosphenotoin, phenytoin -medicines to  increase blood counts like filgrastim, pegfilgrastim, sargramostim -other chemotherapy drugs like cisplatin, L-asparaginase, methotrexate, mitomycin, paclitaxel -pegaspargase -vaccines -zalcitabine, ddC Talk to your doctor or health care professional before taking any of these medicines: -acetaminophen -aspirin -ibuprofen -ketoprofen -naproxen This list may not describe all possible interactions. Give your health care provider a list of all the medicines, herbs, non-prescription drugs, or dietary supplements you use. Also tell them if you smoke, drink alcohol, or use illegal drugs. Some items may interact with your medicine. What should I watch for while using this medicine? Your condition will be monitored carefully while you are receiving this medicine. You will need important blood work done while you are taking this medicine. This drug may make you feel generally unwell. This is not uncommon, as chemotherapy can affect healthy cells as well as cancer cells. Report any side effects. Continue your course of treatment even though you feel ill unless your doctor tells you to stop. In some cases, you may be given additional medicines to help with side effects. Follow all directions for their use. Call your doctor or health care professional for advice if you get a fever, chills or sore throat, or other symptoms of a cold or flu. Do not treat yourself. Avoid taking products that contain aspirin, acetaminophen, ibuprofen, naproxen, or ketoprofen unless instructed by your doctor. These medicines may hide a fever. Do   not become pregnant while taking this medicine. Women should inform their doctor if they wish to become pregnant or think they might be pregnant. There is a potential for serious side effects to an unborn child. Talk to your health care professional or pharmacist for more information. Do not breast-feed an infant while taking this medicine. Men may have a lower sperm count while taking  this medicine. Talk to your doctor if you plan to father a child. What side effects may I notice from receiving this medicine? Side effects that you should report to your doctor or health care professional as soon as possible: -allergic reactions like skin rash, itching or hives, swelling of the face, lips, or tongue -breathing problems -confusion or changes in emotions or moods -constipation -cough -mouth sores -muscle weakness -nausea and vomiting -pain, swelling, redness or irritation at the injection site -pain, tingling, numbness in the hands or feet -problems with balance, talking, walking -seizures -stomach pain -trouble passing urine or change in the amount of urine Side effects that usually do not require medical attention (report to your doctor or health care professional if they continue or are bothersome): -diarrhea -hair loss -jaw pain -loss of appetite This list may not describe all possible side effects. Call your doctor for medical advice about side effects. You may report side effects to FDA at 1-800-FDA-1088. Where should I keep my medicine? This drug is given in a hospital or clinic and will not be stored at home. NOTE: This sheet is a summary. It may not cover all possible information. If you have questions about this medicine, talk to your doctor, pharmacist, or health care provider.  2019 Elsevier/Gold Standard (2007-10-24 17:17:13)  Cyclophosphamide injection What is this medicine? CYCLOPHOSPHAMIDE (sye kloe FOSS fa mide) is a chemotherapy drug. It slows the growth of cancer cells. This medicine is used to treat many types of cancer like lymphoma, myeloma, leukemia, breast cancer, and ovarian cancer, to name a few. This medicine may be used for other purposes; ask your health care provider or pharmacist if you have questions. COMMON BRAND NAME(S): Cytoxan, Neosar What should I tell my health care provider before I take this medicine? They need to know if you  have any of these conditions: -blood disorders -history of other chemotherapy -infection -kidney disease -liver disease -recent or ongoing radiation therapy -tumors in the bone marrow -an unusual or allergic reaction to cyclophosphamide, other chemotherapy, other medicines, foods, dyes, or preservatives -pregnant or trying to get pregnant -breast-feeding How should I use this medicine? This drug is usually given as an injection into a vein or muscle or by infusion into a vein. It is administered in a hospital or clinic by a specially trained health care professional. Talk to your pediatrician regarding the use of this medicine in children. Special care may be needed. Overdosage: If you think you have taken too much of this medicine contact a poison control center or emergency room at once. NOTE: This medicine is only for you. Do not share this medicine with others. What if I miss a dose? It is important not to miss your dose. Call your doctor or health care professional if you are unable to keep an appointment. What may interact with this medicine? This medicine may interact with the following medications: -amiodarone -amphotericin B -azathioprine -certain antiviral medicines for HIV or AIDS such as protease inhibitors (e.g., indinavir, ritonavir) and zidovudine -certain blood pressure medications such as benazepril, captopril, enalapril, fosinopril, lisinopril, moexipril, monopril, perindopril, quinapril, ramipril,   trandolapril -certain cancer medications such as anthracyclines (e.g., daunorubicin, doxorubicin), busulfan, cytarabine, paclitaxel, pentostatin, tamoxifen, trastuzumab -certain diuretics such as chlorothiazide, chlorthalidone, hydrochlorothiazide, indapamide, metolazone -certain medicines that treat or prevent blood clots like warfarin -certain muscle relaxants such as succinylcholine -cyclosporine -etanercept -indomethacin -medicines to increase blood counts like  filgrastim, pegfilgrastim, sargramostim -medicines used as general anesthesia -metronidazole -natalizumab This list may not describe all possible interactions. Give your health care provider a list of all the medicines, herbs, non-prescription drugs, or dietary supplements you use. Also tell them if you smoke, drink alcohol, or use illegal drugs. Some items may interact with your medicine. What should I watch for while using this medicine? Visit your doctor for checks on your progress. This drug may make you feel generally unwell. This is not uncommon, as chemotherapy can affect healthy cells as well as cancer cells. Report any side effects. Continue your course of treatment even though you feel ill unless your doctor tells you to stop. Drink water or other fluids as directed. Urinate often, even at night. In some cases, you may be given additional medicines to help with side effects. Follow all directions for their use. Call your doctor or health care professional for advice if you get a fever, chills or sore throat, or other symptoms of a cold or flu. Do not treat yourself. This drug decreases your body's ability to fight infections. Try to avoid being around people who are sick. This medicine may increase your risk to bruise or bleed. Call your doctor or health care professional if you notice any unusual bleeding. Be careful brushing and flossing your teeth or using a toothpick because you may get an infection or bleed more easily. If you have any dental work done, tell your dentist you are receiving this medicine. You may get drowsy or dizzy. Do not drive, use machinery, or do anything that needs mental alertness until you know how this medicine affects you. Do not become pregnant while taking this medicine or for 1 year after stopping it. Women should inform their doctor if they wish to become pregnant or think they might be pregnant. Men should not father a child while taking this medicine and for  4 months after stopping it. There is a potential for serious side effects to an unborn child. Talk to your health care professional or pharmacist for more information. Do not breast-feed an infant while taking this medicine. This medicine may interfere with the ability to have a child. This medicine has caused ovarian failure in some women. This medicine has caused reduced sperm counts in some men. You should talk with your doctor or health care professional if you are concerned about your fertility. If you are going to have surgery, tell your doctor or health care professional that you have taken this medicine. What side effects may I notice from receiving this medicine? Side effects that you should report to your doctor or health care professional as soon as possible: -allergic reactions like skin rash, itching or hives, swelling of the face, lips, or tongue -low blood counts - this medicine may decrease the number of white blood cells, red blood cells and platelets. You may be at increased risk for infections and bleeding. -signs of infection - fever or chills, cough, sore throat, pain or difficulty passing urine -signs of decreased platelets or bleeding - bruising, pinpoint red spots on the skin, black, tarry stools, blood in the urine -signs of decreased red blood cells - unusually weak or tired,   fainting spells, lightheadedness -breathing problems -dark urine -dizziness -palpitations -swelling of the ankles, feet, hands -trouble passing urine or change in the amount of urine -weight gain -yellowing of the eyes or skin Side effects that usually do not require medical attention (report to your doctor or health care professional if they continue or are bothersome): -changes in nail or skin color -hair loss -missed menstrual periods -mouth sores -nausea, vomiting This list may not describe all possible side effects. Call your doctor for medical advice about side effects. You may report side  effects to FDA at 1-800-FDA-1088. Where should I keep my medicine? This drug is given in a hospital or clinic and will not be stored at home. NOTE: This sheet is a summary. It may not cover all possible information. If you have questions about this medicine, talk to your doctor, pharmacist, or health care provider.  2019 Elsevier/Gold Standard (2011-12-11 16:22:58)  Rituximab injection What is this medicine? RITUXIMAB (ri TUX i mab) is a monoclonal antibody. It is used to treat certain types of cancer like non-Hodgkin lymphoma and chronic lymphocytic leukemia. It is also used to treat rheumatoid arthritis, granulomatosis with polyangiitis (or Wegener's granulomatosis), microscopic polyangiitis, and pemphigus vulgaris. This medicine may be used for other purposes; ask your health care provider or pharmacist if you have questions. COMMON BRAND NAME(S): Rituxan What should I tell my health care provider before I take this medicine? They need to know if you have any of these conditions: -heart disease -infection (especially a virus infection such as hepatitis B, chickenpox, cold sores, or herpes) -immune system problems -irregular heartbeat -kidney disease -low blood counts, like low white cell, platelet, or red cell counts -lung or breathing disease, like asthma -recently received or scheduled to receive a vaccine -an unusual or allergic reaction to rituximab, other medicines, foods, dyes, or preservatives -pregnant or trying to get pregnant -breast-feeding How should I use this medicine? This medicine is for infusion into a vein. It is administered in a hospital or clinic by a specially trained health care professional. A special MedGuide will be given to you by the pharmacist with each prescription and refill. Be sure to read this information carefully each time. Talk to your pediatrician regarding the use of this medicine in children. This medicine is not approved for use in  children. Overdosage: If you think you have taken too much of this medicine contact a poison control center or emergency room at once. NOTE: This medicine is only for you. Do not share this medicine with others. What if I miss a dose? It is important not to miss a dose. Call your doctor or health care professional if you are unable to keep an appointment. What may interact with this medicine? -cisplatin -live virus vaccines This list may not describe all possible interactions. Give your health care provider a list of all the medicines, herbs, non-prescription drugs, or dietary supplements you use. Also tell them if you smoke, drink alcohol, or use illegal drugs. Some items may interact with your medicine. What should I watch for while using this medicine? Your condition will be monitored carefully while you are receiving this medicine. You may need blood work done while you are taking this medicine. This medicine can cause serious allergic reactions. To reduce your risk you may need to take medicine before treatment with this medicine. Take your medicine as directed. In some patients, this medicine may cause a serious brain infection that may cause death. If you have any problems   seeing, thinking, speaking, walking, or standing, tell your healthcare professional right away. If you cannot reach your healthcare professional, urgently seek other source of medical care. Call your doctor or health care professional for advice if you get a fever, chills or sore throat, or other symptoms of a cold or flu. Do not treat yourself. This drug decreases your body's ability to fight infections. Try to avoid being around people who are sick. Do not become pregnant while taking this medicine or for 12 months after stopping it. Women should inform their doctor if they wish to become pregnant or think they might be pregnant. There is a potential for serious side effects to an unborn child. Talk to your health care  professional or pharmacist for more information. Do not breast-feed an infant while taking this medicine or for 6 months after stopping it. What side effects may I notice from receiving this medicine? Side effects that you should report to your doctor or health care professional as soon as possible: -allergic reactions like skin rash, itching or hives; swelling of the face, lips, or tongue -breathing problems -chest pain -changes in vision -diarrhea -headache with fever, neck stiffness, sensitivity to light, nausea, or confusion -fast, irregular heartbeat -loss of memory -low blood counts - this medicine may decrease the number of white blood cells, red blood cells and platelets. You may be at increased risk for infections and bleeding. -mouth sores -problems with balance, talking, or walking -redness, blistering, peeling or loosening of the skin, including inside the mouth -signs of infection - fever or chills, cough, sore throat, pain or difficulty passing urine -signs and symptoms of kidney injury like trouble passing urine or change in the amount of urine -signs and symptoms of liver injury like dark yellow or brown urine; general ill feeling or flu-like symptoms; light-colored stools; loss of appetite; nausea; right upper belly pain; unusually weak or tired; yellowing of the eyes or skin -signs and symptoms of low blood pressure like dizziness; feeling faint or lightheaded, falls; unusually weak or tired -stomach pain -swelling of the ankles, feet, hands -unusual bleeding or bruising -vomiting Side effects that usually do not require medical attention (report to your doctor or health care professional if they continue or are bothersome): -headache -joint pain -muscle cramps or muscle pain -nausea -tiredness This list may not describe all possible side effects. Call your doctor for medical advice about side effects. You may report side effects to FDA at 1-800-FDA-1088. Where should  I keep my medicine? This drug is given in a hospital or clinic and will not be stored at home. NOTE: This sheet is a summary. It may not cover all possible information. If you have questions about this medicine, talk to your doctor, pharmacist, or health care provider.  2019 Elsevier/Gold Standard (2017-01-08 13:04:32)  

## 2018-03-15 NOTE — Progress Notes (Signed)
Per Dr. Irene Limbo: Ok to treat today with Bili of 2.0

## 2018-03-15 NOTE — Progress Notes (Signed)
Met w/ pt to introduce myself as his Financial Resource Specialist.  Pt has 3 insurances so copay assistance is not needed at this time.  I offered the [facility] grant, went over what it covers and gave him the income requirement.  Pt's income is above the requirement so he doesn't qualify for the grant at this time.  He has my card for any questions or concerns he may have in the future.  °

## 2018-03-15 NOTE — Progress Notes (Signed)
Spoke w/ Dr. Irene Limbo today - T bili slightly increased to 2.0 today. He is aware. No further chemo dose changes today (including vincristine and doxorubicin) - doses were already reduced for R-mini-CHOP.  Demetrius Charity, PharmD, [facility] Pharmac[facility]acy Phone: [phone] 03/15/2018

## 2018-03-16 ENCOUNTER — Telehealth: Payer: Self-pay | Admitting: Hematology

## 2018-03-16 ENCOUNTER — Telehealth: Payer: Self-pay | Admitting: *Deleted

## 2018-03-16 NOTE — Telephone Encounter (Signed)
-----   Message from Zola Button, RN sent at 03/15/2018  3:51 PM EST ----- Regarding: Dr. Irene Limbo; First time F/U Call Pt received first time RCHOP. Tolerated well. He did not know about his lab/MD/injection appt this Thursday 03/17/18--if you don't mind reminding him again. Thanks! --[employer] [facility]

## 2018-03-16 NOTE — Telephone Encounter (Signed)
FYI Unable to complete chemotherapy follow up with receipt of voicemail x 2.  Evans Lance received first R-CHOP.  Requested return call for any change in symptoms or signs of side effects to [phone] to reach after hours nurse call team.  Scheduled injection tomorrow at 8:00 am.

## 2018-03-16 NOTE — Telephone Encounter (Signed)
Per desk nurse lab/port/fu scheduled for 2/6 should be moved to the following week due to 2/6 too early for post toxicity check. Moved 2/6 lab/port/f/u to 2/11 and moved time of 2/6 injection to 8 am. Spoke with dtr she is aware.

## 2018-03-17 ENCOUNTER — Inpatient Hospital Stay: Payer: Medicare HMO | Admitting: Hematology

## 2018-03-17 ENCOUNTER — Inpatient Hospital Stay: Payer: Medicare HMO

## 2018-03-17 ENCOUNTER — Other Ambulatory Visit: Payer: Medicare HMO

## 2018-03-17 VITALS — BP 123/67 | HR 69 | Temp 97.7°F | Resp 16

## 2018-03-17 DIAGNOSIS — C8339 Diffuse large B-cell lymphoma, extranodal and solid organ sites: Secondary | ICD-10-CM

## 2018-03-17 DIAGNOSIS — I10 Essential (primary) hypertension: Secondary | ICD-10-CM | POA: Diagnosis not present

## 2018-03-17 DIAGNOSIS — K5900 Constipation, unspecified: Secondary | ICD-10-CM | POA: Diagnosis not present

## 2018-03-17 DIAGNOSIS — Z5111 Encounter for antineoplastic chemotherapy: Secondary | ICD-10-CM | POA: Diagnosis not present

## 2018-03-17 DIAGNOSIS — R197 Diarrhea, unspecified: Secondary | ICD-10-CM | POA: Diagnosis not present

## 2018-03-17 DIAGNOSIS — Z5189 Encounter for other specified aftercare: Secondary | ICD-10-CM | POA: Diagnosis not present

## 2018-03-17 DIAGNOSIS — Z5112 Encounter for antineoplastic immunotherapy: Secondary | ICD-10-CM | POA: Diagnosis not present

## 2018-03-17 DIAGNOSIS — C8335 Diffuse large B-cell lymphoma, lymph nodes of inguinal region and lower limb: Secondary | ICD-10-CM | POA: Diagnosis not present

## 2018-03-17 DIAGNOSIS — R140 Abdominal distension (gaseous): Secondary | ICD-10-CM | POA: Diagnosis not present

## 2018-03-17 DIAGNOSIS — R490 Dysphonia: Secondary | ICD-10-CM | POA: Diagnosis not present

## 2018-03-17 MED ORDER — PEGFILGRASTIM-CBQV 6 MG/0.6ML ~~LOC~~ SOSY
6.0000 mg | PREFILLED_SYRINGE | Freq: Once | SUBCUTANEOUS | Status: AC
  Administered 2018-03-17: 6 mg via SUBCUTANEOUS

## 2018-03-21 NOTE — Progress Notes (Signed)
HEMATOLOGY/ONCOLOGY CLINIC NOTE  Date of Service: 03/22/2018  Patient Care Team: [name], MD as PCP - General (Family Medicine)  CHIEF COMPLAINTS/PURPOSE OF CONSULTATION:  Primary testicular diffuse Large B-Cell Lymphoma   HISTORY OF PRESENTING ILLNESS:   Carl [name] is a wonderful 78 y.o. male who has been referred to [place] by Dr. [name] for evaluation and management of Diffuse Large B-Cell Lymphoma. The pt reports that he is doing well overall.  The pt notes that he first began feeling differently about 4-5 months ago, when he noticed left testicular swelling. He notes that the his testicle was "a little bit larger than a golf ball," and that this was not painful. The pt notes that he has not had any abdominal distension or abdominal pain. The pt denies any fevers, chills, night sweats, or unexpected weight loss. He also denies any leg swelling. The pt denies any trauma sustained by the testicles. He denies noticing any other related symptoms.   The pt reports that he believed he had food poisoning prior to this, as he had lots of diarrhea. He notes that he has observed a small knot on the back of his right wrist, which has not changed over the last year.  The pt has two stents in his heart which were placed in 2016. The pt notes that his blood pressure has been stable and well-controlled. He notes that his last ECHO was 6 months ago, and he has not had any CP or SOB since that time. He takes '81mg'$  aspirin daily. He also takes Atorvastatin. The pt denies feeling limited in his walking by SOB or CP. He notes that he could walk 2 miles before ceasing to walk, noting that feeling tired in his muscles would limit him.   The pt has recently pursued surgery on his throat with ENT Dr. Melida Quitter for altered voice and hoarseness related to his vocal cords.   The pt notes that he currently lives by himself, and is not married. He has a daughter who is 14 years old. He  notes that his daughter "tries to help out." He notes that he does not have a power of attorney, but that this would be his daughter.   Of note prior to the patient's visit today, pt has had a Left Testis Surgical biopsy completed on 01/05/18 with results revealing Diffuse Large B-Cell Lymphoma.   The pt also had an US Scrotum on 12/08/17 which revealed Abnormal appearance of the left testicle which is larger than the right and diffusely heterogeneous in echotexture. Cannot exclude infiltrating process/mass. Appearance is concerning for possible neoplasm.  Most recent lab results (11/26/17) of BMP revealed all values WNL except for Glucose at 104, Creatinine at 1.30, Calcium at 8.7, GFR at 59 11/26/17 Hemoglobin value at 13.6.   On review of systems, pt reports stable energy levels, left testicular swelling, and denies fevers, chills, night sweats, unexpected weight loss, CP, SOB, back pains, flank pains, abdominal pains, leg swelling, testicular pain, and any other symptoms.   On PMHx the pt reports CAD, two heart stents placed in 2016. On Social Hx the pt denies smoking.  On Family Hx the pt reports mother with lymphoma of the breast. Two sisters with breast cancer, one at age 90.  Interval History:   Carl [name] returns today for management and evaluation of his Primary testicular Diffuse Large B-Cell Lymphoma. The patient's last visit with [place] was on 03/02/18. The pt reports  that he is doing well overall.   The pt reports that he felt a little bloated after his first infusion, and notes his stools are a "little harder." He notes that he has been eating 3-4 prunes each day. He also notes that he has felt better after eating and notes that he has a strong appetite. The pt notes that he has continued functioning well at home and is sleeping well. He denies any bone pains and tolerated his Neulasta shot well.   Lab results today (03/22/18) of CBC w/diff and CMP is as follows: all values  are WNL except for WBC at 20.1k, RBC at 3.95, HGB at 12.4, HCT at 38.5, PLT at 127k, ANC at 16.2k, Monocytes abs at 1.3k, Glucose at 128, Calcium at 8.6, Total Protein at 6.1, Albumin at 3.1, AST at 12, Alk Phos at 162.  On review of systems, pt reports hard stools, constipation, strong appetite, sleeping well, good energy levels, and denies bone pains, fatigue, abdominal pains, leg swelling, and any other symptoms.    MEDICAL HISTORY:  Past Medical History:  Diagnosis Date  . Arthritis   . Coronary artery disease   . HOH (hard of hearing)   . Hyperlipidemia   . Hypertension   . Ischemic cardiomyopathy   . Myocardial infarction ([place]) 2016  . Quadriceps tendon rupture    LEFT    SURGICAL HISTORY: Past Surgical History:  Procedure Laterality Date  . CARDIAC CATHETERIZATION N/A 10/15/2014   Procedure: Left Heart Cath and Coronary Angiography;  Surgeon: Charolette Forward, MD;  Location: [facility];  Service: Cardiovascular;  Laterality: N/A;  . COLONOSCOPY    . IR IMAGING GUIDED PORT INSERTION  03/14/2018  . MICROLARYNGOSCOPY WITH CO2 LASER AND EXCISION OF VOCAL CORD LESION N/A 11/26/2017   Procedure: MICROLARYNGOSCOPY WITH CO2 LASER AND EXCISION OF VOCAL CORD LESION WITH JET VENTILATION;  Surgeon: Melida Quitter, MD;  Location: [facility];  Service: ENT;  Laterality: N/A;  . QUADRICEPS TENDON REPAIR Left 02/20/2014   Procedure: LEFT REPAIR QUADRICEP TENDON;  Surgeon: Mauri Pole, MD;  Location: [facility];  Service: Orthopedics;  Laterality: Left;  . THROAT SURGERY  2010    SOCIAL HISTORY: Social History   Socioeconomic History  . Marital status: Divorced    Spouse name: Not on file  . Number of children: Not on file  . Years of education: Not on file  . Highest education level: Not on file  Occupational History  . Not on file  Social Needs  . Financial resource strain: Not on file  . Food insecurity:    Worry: Not on file    Inability: Not on file  . Transportation needs:     Medical: Not on file    Non-medical: Not on file  Tobacco Use  . Smoking status: Never Smoker  . Smokeless tobacco: Never Used  Substance and Sexual Activity  . Alcohol use: Yes    Comment: OCCASIONAL  . Drug use: No  . Sexual activity: Not on file  Lifestyle  . Physical activity:    Days per week: Not on file    Minutes per session: Not on file  . Stress: Not on file  Relationships  . Social connections:    Talks on phone: Not on file    Gets together: Not on file    Attends religious service: Not on file    Active member of club or organization: Not on file    Attends meetings of clubs  or organizations: Not on file    Relationship status: Not on file  . Intimate partner violence:    Fear of current or ex partner: Not on file    Emotionally abused: Not on file    Physically abused: Not on file    Forced sexual activity: Not on file  Other Topics Concern  . Not on file  Social History Narrative  . Not on file    FAMILY HISTORY: No family history on file.  ALLERGIES:  has No Known Allergies.  MEDICATIONS:  Current Outpatient Medications  Medication Sig Dispense Refill  . aspirin EC 81 MG tablet Take 81 mg by mouth daily.    Marland Kitchen atorvastatin (LIPITOR) 40 MG tablet Take 1 tablet (40 mg total) by mouth daily at 6 PM. 60 tablet 3  . carvedilol (COREG) 6.25 MG tablet Take 6.25 mg by mouth 2 (two) times daily with a meal.    . cholecalciferol (VITAMIN D) 1000 UNITS tablet Take 1,000 Units by mouth daily.    Marland Kitchen losartan (COZAAR) 25 MG tablet Take 25 mg by mouth every evening.    . Multiple Vitamin (MULTIVITAMIN WITH MINERALS) TABS tablet Take 1 tablet by mouth daily.    . nitroGLYCERIN (NITROSTAT) 0.4 MG SL tablet Place 1 tablet (0.4 mg total) under the tongue every 5 (five) minutes x 3 doses as needed for chest pain. 25 tablet 12  . ondansetron (ZOFRAN) 8 MG tablet Take 1 tablet (8 mg total) by mouth 2 (two) times daily as needed for refractory nausea / vomiting. Start on day 3  after cyclophosphamide chemotherapy. 30 tablet 1  . Polyethyl Glycol-Propyl Glycol (SYSTANE OP) Place 1 drop into both eyes at bedtime.     . predniSONE (DELTASONE) 20 MG tablet Take 3 tablets (60 mg total) by mouth daily. Take on days 1-5 of chemotherapy. 15 tablet 5  . prochlorperazine (COMPAZINE) 10 MG tablet Take 1 tablet (10 mg total) by mouth every 6 (six) hours as needed (Nausea or vomiting). 30 tablet 6  . white petrolatum (VASELINE) GEL Apply 1 application topically as needed for dry skin (itching).     No current facility-administered medications for this visit.     REVIEW OF SYSTEMS:    A 10+ POINT REVIEW OF SYSTEMS WAS OBTAINED including neurology, dermatology, psychiatry, cardiac, respiratory, lymph, extremities, GI, GU, Musculoskeletal, constitutional, breasts, reproductive, HEENT.  All pertinent positives are noted in the HPI.  All others are negative.   PHYSICAL EXAMINATION: ECOG PERFORMANCE STATUS: 2 - Symptomatic, <50% confined to bed  Vitals:   03/22/18 0953  BP: 114/61  Pulse: 71  Resp: 18  Temp: 98.2 F (36.8 C)  SpO2: 99%   Filed Weights   03/22/18 0953  Weight: 241 lb 8 oz (109.5 kg)   .Body mass index is 31.86 kg/m.  GENERAL:alert, in no acute distress and comfortable SKIN: no acute rashes, no significant lesions EYES: conjunctiva are pink and non-injected, sclera anicteric OROPHARYNX: MMM, no exudates, no oropharyngeal erythema or ulceration NECK: supple, no JVD LYMPH:  no palpable lymphadenopathy in the cervical, axillary or inguinal regions LUNGS: clear to auscultation b/l with normal respiratory effort HEART: regular rate & rhythm ABDOMEN:  normoactive bowel sounds , non tender, not distended. No palpable hepatosplenomegaly.  Extremity: no pedal edema PSYCH: alert & oriented x 3 with fluent speech NEURO: no focal motor/sensory deficits   LABORATORY DATA:  I have reviewed the data as listed  . CBC Latest Ref Rng & Units 03/22/2018 03/14/2018  03/02/2018  WBC 4.0 - 10.5 K/uL 20.1(H) 6.2 6.0  Hemoglobin 13.0 - 17.0 g/dL 12.4(L) 13.8 14.2  Hematocrit 39.0 - 52.0 % 38.5(L) 42.8 42.5  Platelets 150 - 400 K/uL 127(L) 155 192    . CMP Latest Ref Rng & Units 03/22/2018 03/14/2018 03/02/2018  Glucose 70 - 99 mg/dL 128(H) 100(H) 98  BUN 8 - 23 mg/dL '11 16 11  '$ Creatinine 0.61 - 1.24 mg/dL 1.05 1.06 1.06  Sodium 135 - 145 mmol/L 140 139 140  Potassium 3.5 - 5.1 mmol/L 4.0 4.1 4.5  Chloride 98 - 111 mmol/L 103 107 104  CO2 22 - 32 mmol/L '31 26 28  '$ Calcium 8.9 - 10.3 mg/dL 8.6(L) 9.0 9.0  Total Protein 6.5 - 8.1 g/dL 6.1(L) 8.0 7.2  Total Bilirubin 0.3 - 1.2 mg/dL 0.9 2.0(H) 1.7(H)  Alkaline Phos 38 - 126 U/L 162(H) 51 66  AST 15 - 41 U/L 12(L) 28 22  ALT 0 - 44 U/L '17 28 21   '$ 01/05/18 Pathology:    RADIOGRAPHIC STUDIES: I have personally reviewed the radiological images as listed and agreed with the findings in the report. Ir Imaging Guided Port Insertion  Result Date: 03/14/2018 INDICATION: 78 year old male with a history of diffuse large B-cell lymphoma EXAM: IMPLANTED PORT A CATH PLACEMENT WITH ULTRASOUND AND FLUOROSCOPIC GUIDANCE MEDICATIONS: 2.0 g Ancef; The antibiotic was administered within an appropriate time interval prior to skin puncture. ANESTHESIA/SEDATION: Moderate (conscious) sedation was employed during this procedure. A total of Versed 2.0 mg and Fentanyl 100 mcg was administered intravenously. Moderate Sedation Time: 20 minutes. The patient's level of consciousness and vital signs were monitored continuously by radiology nursing throughout the procedure under my direct supervision. FLUOROSCOPY TIME:  0 minutes, 6 seconds (1 mGy) COMPLICATIONS: None PROCEDURE: The procedure, risks, benefits, and alternatives were explained to the patient. Questions regarding the procedure were encouraged and answered. The patient understands and consents to the procedure. Ultrasound survey was performed with images stored and sent to PACs.  The right neck and chest was prepped with chlorhexidine, and draped in the usual sterile fashion using maximum barrier technique (cap and mask, sterile gown, sterile gloves, large sterile sheet, hand hygiene and cutaneous antiseptic). Antibiotic prophylaxis was provided with 2.0g Ancef administered IV one hour prior to skin incision. Local anesthesia was attained by infiltration with 1% lidocaine without epinephrine. Ultrasound demonstrated patency of the right internal jugular vein, and this was documented with an image. Under real-time ultrasound guidance, this vein was accessed with a 21 gauge micropuncture needle and image documentation was performed. A small dermatotomy was made at the access site with an 11 scalpel. A 0.018" wire was advanced into the SVC and used to estimate the length of the internal catheter. The access needle exchanged for a 76F micropuncture vascular sheath. The 0.018" wire was then removed and a 0.035" wire advanced into the IVC. An appropriate location for the subcutaneous reservoir was selected below the clavicle and an incision was made through the skin and underlying soft tissues. The subcutaneous tissues were then dissected using a combination of blunt and sharp surgical technique and a pocket was formed. A single lumen power injectable portacatheter was then tunneled through the subcutaneous tissues from the pocket to the dermatotomy and the port reservoir placed within the subcutaneous pocket. The venous access site was then serially dilated and a peel away vascular sheath placed over the wire. The wire was removed and the port catheter advanced into position under fluoroscopic guidance. The catheter  tip is positioned in the cavoatrial junction. This was documented with a spot image. The portacatheter was then tested and found to flush and aspirate well. The port was flushed with saline followed by 100 units/mL heparinized saline. The pocket was then closed in two layers using  first subdermal inverted interrupted absorbable sutures followed by a running subcuticular suture. The epidermis was then sealed with Dermabond. The dermatotomy at the venous access site was also seal with Dermabond. Patient tolerated the procedure well and remained hemodynamically stable throughout. No complications encountered and no significant blood loss encountered IMPRESSION: Status post right IJ port catheter placement. Catheter ready for use. Signed, Dulcy Fanny. Dellia Nims, RPVI [facility] Electronically Signed   By: Corrie Mckusick D.O.   On: 03/14/2018 15:50    ASSESSMENT & PLAN:   78 y.o. male with  1. Primary Testicular Diffuse Large B-Cell Lymphoma, Stage 1E 11/30/16 NM Myocar Multi w/spect w/wall motion which revealed a LV EF of 44%  12/08/17 US Scrotum revealed Abnormal appearance of the left testicle which is larger than the right and diffusely heterogeneous in echotexture. Cannot exclude infiltrating process/mass. Appearance is concerning for possible neoplasm.  01/05/18 Left testes biopsy revealed Primary Testicular Diffuse Large B-Cell Lymphoma   01/25/18 Hep B, Hep C and HIV negative  01/31/18 ECHO revealed a LV EF of 51%. Left ventricle: The cavity size was normal. Wall motion was normal; there were no regional wall motion abnormalities. Atrial septum: No defect or patent foramen ovale was identified.  02/07/18 PET/CT revealed No findings for metabolically active lymphoma involving the neck, chest, abdomen/pelvis or osseous structures.  PLAN -Discussed pt labwork today, 03/22/18; ANC at 16.2k in setting of G-CSF support, HGB borderline low at 12.4, PLT slightly low at 127k -Will order Miralax and Senna S -The pt has no prohibitive toxicities from C1 R-mini-CHOP with G-CSF support at this time. -Discussed this patient's case with my tumor board and the recommendation for 6 cycles of R-mini-CHOP, RT to both testicles,  and IT MTX , which the pt agrees with -Discussed that IT MTX is possibly indicated as there is a noted probability of brain recurrence - however patient is hesitant given his age and potential for adverse effects -Discussed infection prevention strategies with the pt including crowd avoidance and wearing a mask while in public -Recommended that the pt continue to eat well, drink at least 48-64 oz of water each day, and walk 20-30 minutes each day.  -Previously recommended that the pt obtain a power of attorney -Will see the pt back in 2 weeks  2.  . Past Medical History:  Diagnosis Date  . Arthritis   . Coronary artery disease   . HOH (hard of hearing)   . Hyperlipidemia   . Hypertension   . Ischemic cardiomyopathy   . Myocardial infarction ([place]) 2016  . Quadriceps tendon rupture    LEFT  -continue f/u with PCP -given systolic dysfunction will rpt ECHO   -Please schedule C2 of R-mini CHOP(as per orders on 2/25) with labs and MD visit -Neulasta on D3 of each cycle -please Schedule C3 of R-Mini CHOP with labs and MD visit per orders   All of the patients questions were answered with apparent satisfaction. The patient knows to call the clinic with any problems, questions or concerns.  The total time spent in the appt was 25 minutes and more than 50% was on counseling and direct patient cares.    Sullivan Lone  MD MS AAHIVMS [facility] Hematology/Oncology Physician [place]  (Office):       [phone] (Work cell):  [phone] (Fax):           [phone]  03/22/2018 10:22 AM  I, Baldwin [place], am acting as a scribe for Dr. Sullivan Lone.   .I have reviewed the above documentation for accuracy and completeness, and I agree with the above. Brunetta Genera MD

## 2018-03-22 ENCOUNTER — Inpatient Hospital Stay: Payer: Medicare HMO

## 2018-03-22 ENCOUNTER — Telehealth: Payer: Self-pay | Admitting: Hematology

## 2018-03-22 ENCOUNTER — Inpatient Hospital Stay (HOSPITAL_BASED_OUTPATIENT_CLINIC_OR_DEPARTMENT_OTHER): Payer: Medicare HMO | Admitting: Hematology

## 2018-03-22 VITALS — BP 114/61 | HR 71 | Temp 98.2°F | Resp 18 | Ht 73.0 in | Wt 241.5 lb

## 2018-03-22 DIAGNOSIS — I2510 Atherosclerotic heart disease of native coronary artery without angina pectoris: Secondary | ICD-10-CM | POA: Diagnosis not present

## 2018-03-22 DIAGNOSIS — Z955 Presence of coronary angioplasty implant and graft: Secondary | ICD-10-CM

## 2018-03-22 DIAGNOSIS — C8339 Diffuse large B-cell lymphoma, extranodal and solid organ sites: Secondary | ICD-10-CM

## 2018-03-22 DIAGNOSIS — R140 Abdominal distension (gaseous): Secondary | ICD-10-CM

## 2018-03-22 DIAGNOSIS — K5900 Constipation, unspecified: Secondary | ICD-10-CM | POA: Diagnosis not present

## 2018-03-22 DIAGNOSIS — I252 Old myocardial infarction: Secondary | ICD-10-CM | POA: Diagnosis not present

## 2018-03-22 DIAGNOSIS — Z7982 Long term (current) use of aspirin: Secondary | ICD-10-CM

## 2018-03-22 DIAGNOSIS — I10 Essential (primary) hypertension: Secondary | ICD-10-CM

## 2018-03-22 DIAGNOSIS — Z5112 Encounter for antineoplastic immunotherapy: Secondary | ICD-10-CM | POA: Diagnosis not present

## 2018-03-22 DIAGNOSIS — C8335 Diffuse large B-cell lymphoma, lymph nodes of inguinal region and lower limb: Secondary | ICD-10-CM | POA: Diagnosis not present

## 2018-03-22 DIAGNOSIS — R197 Diarrhea, unspecified: Secondary | ICD-10-CM

## 2018-03-22 DIAGNOSIS — R490 Dysphonia: Secondary | ICD-10-CM | POA: Diagnosis not present

## 2018-03-22 DIAGNOSIS — Z79899 Other long term (current) drug therapy: Secondary | ICD-10-CM | POA: Diagnosis not present

## 2018-03-22 DIAGNOSIS — C83398 Diffuse large b-cell lymphoma of other extranodal and solid organ sites: Secondary | ICD-10-CM

## 2018-03-22 DIAGNOSIS — Z5189 Encounter for other specified aftercare: Secondary | ICD-10-CM | POA: Diagnosis not present

## 2018-03-22 DIAGNOSIS — Z95828 Presence of other vascular implants and grafts: Secondary | ICD-10-CM | POA: Insufficient documentation

## 2018-03-22 DIAGNOSIS — Z5111 Encounter for antineoplastic chemotherapy: Secondary | ICD-10-CM | POA: Diagnosis not present

## 2018-03-22 LAB — CMP (CANCER CENTER ONLY)
ALT: 17 U/L (ref 0–44)
AST: 12 U/L — ABNORMAL LOW (ref 15–41)
Albumin: 3.1 g/dL — ABNORMAL LOW (ref 3.5–5.0)
Alkaline Phosphatase: 162 U/L — ABNORMAL HIGH (ref 38–126)
Anion gap: 6 (ref 5–15)
BUN: 11 mg/dL (ref 8–23)
CO2: 31 mmol/L (ref 22–32)
Calcium: 8.6 mg/dL — ABNORMAL LOW (ref 8.9–10.3)
Chloride: 103 mmol/L (ref 98–111)
Creatinine: 1.05 mg/dL (ref 0.61–1.24)
GFR, Est AFR Am: >60 mL/min (ref >60)
GFR, Estimated: >60 mL/min (ref >60)
Glucose, Bld: 128 mg/dL — ABNORMAL HIGH (ref 70–99)
Potassium: 4 mmol/L (ref 3.5–5.1)
Sodium: 140 mmol/L (ref 135–145)
Total Bilirubin: 0.9 mg/dL (ref 0.3–1.2)
Total Protein: 6.1 g/dL — ABNORMAL LOW (ref 6.5–8.1)

## 2018-03-22 LAB — CBC WITH DIFFERENTIAL/PLATELET
Abs Immature Granulocytes: 0.78 10*3/uL — ABNORMAL HIGH (ref 0.00–0.07)
Basophils Absolute: 0.1 10*3/uL (ref 0.0–0.1)
Basophils Relative: 1 %
Eosinophils Absolute: 0.3 10*3/uL (ref 0.0–0.5)
Eosinophils Relative: 1 %
HCT: 38.5 % — ABNORMAL LOW (ref 39.0–52.0)
Hemoglobin: 12.4 g/dL — ABNORMAL LOW (ref 13.0–17.0)
Immature Granulocytes: 4 %
Lymphocytes Relative: 7 %
Lymphs Abs: 1.4 10*3/uL (ref 0.7–4.0)
MCH: 31.4 pg (ref 26.0–34.0)
MCHC: 32.2 g/dL (ref 30.0–36.0)
MCV: 97.5 fL (ref 80.0–100.0)
Monocytes Absolute: 1.3 10*3/uL — ABNORMAL HIGH (ref 0.1–1.0)
Monocytes Relative: 7 %
Neutro Abs: 16.2 10*3/uL — ABNORMAL HIGH (ref 1.7–7.7)
Neutrophils Relative %: 80 %
Platelets: 127 10*3/uL — ABNORMAL LOW (ref 150–400)
RBC: 3.95 MIL/uL — ABNORMAL LOW (ref 4.22–5.81)
RDW: 12.4 % (ref 11.5–15.5)
WBC: 20.1 10*3/uL — ABNORMAL HIGH (ref 4.0–10.5)
nRBC: 0 % (ref 0.0–0.2)

## 2018-03-22 MED ORDER — SENNOSIDES-DOCUSATE SODIUM 8.6-50 MG PO TABS: 2.0000 | ORAL_TABLET | Freq: Every evening | ORAL | 1 refills | Status: DC | PRN

## 2018-03-22 MED ORDER — SODIUM CHLORIDE 0.9% FLUSH
10.0000 mL | INTRAVENOUS | Status: DC | PRN
  Administered 2018-03-22: 10 mL
  Filled 2018-03-22: qty 10

## 2018-03-22 MED ORDER — HEPARIN SOD (PORK) LOCK FLUSH 100 UNIT/ML IV SOLN
500.0000 [IU] | Freq: Once | INTRAVENOUS | Status: AC | PRN
  Administered 2018-03-22: 500 [IU]
  Filled 2018-03-22: qty 5

## 2018-03-22 MED ORDER — POLYETHYLENE GLYCOL 3350 17 G PO PACK: 17.0000 g | PACK | Freq: Every day | ORAL | 1 refills | Status: DC

## 2018-03-22 NOTE — Telephone Encounter (Signed)
Scheduled appt per 2/11 los. ° °Printed calendar and avs. °

## 2018-04-04 NOTE — Progress Notes (Signed)
HEMATOLOGY/ONCOLOGY CLINIC NOTE  Date of Service: 04/05/2018  Patient Care Team: [name], MD as PCP - General (Family Medicine)  CHIEF COMPLAINTS/PURPOSE OF CONSULTATION:  Primary testicular diffuse Large B-Cell Lymphoma   HISTORY OF PRESENTING ILLNESS:   Carl [name] is a wonderful 78 y.o. male who has been referred to Carl [name] by Dr. [name] for evaluation and management of Diffuse Large B-Cell Lymphoma. The pt reports that he is doing well overall.  The pt notes that he first began feeling differently about 4-5 months ago, when he noticed left testicular swelling. He notes that the his testicle was "a little bit larger than a golf ball," and that this was not painful. The pt notes that he has not had any abdominal distension or abdominal pain. The pt denies any fevers, chills, night sweats, or unexpected weight loss. He also denies any leg swelling. The pt denies any trauma sustained by the testicles. He denies noticing any other related symptoms.   The pt reports that he believed he had food poisoning prior to this, as he had lots of diarrhea. He notes that he has observed a small knot on the back of his right wrist, which has not changed over the last year.  The pt has two stents in his heart which were placed in 2016. The pt notes that his blood pressure has been stable and well-controlled. He notes that his last ECHO was 6 months ago, and he has not had any CP or SOB since that time. He takes 81mg  aspirin daily. He also takes Atorvastatin. The pt denies feeling limited in his walking by SOB or CP. He notes that he could walk 2 miles before ceasing to walk, noting that feeling tired in his muscles would limit him.   The pt has recently pursued surgery on his throat with ENT Dr. Melida Quitter for altered voice and hoarseness related to his vocal cords.   The pt notes that he currently lives by himself, and is not married. He has a daughter who is 77 years old. He  notes that his daughter "tries to help out." He notes that he does not have a power of attorney, but that this would be his daughter.   Of note prior to the patient's visit today, pt has had a Left Testis Surgical biopsy completed on 01/05/18 with results revealing Diffuse Large B-Cell Lymphoma.   The pt also had an Carl [name] Scrotum on 12/08/17 which revealed Abnormal appearance of the left testicle which is larger than the right and diffusely heterogeneous in echotexture. Cannot exclude infiltrating process/mass. Appearance is concerning for possible neoplasm.  Most recent lab results (11/26/17) of BMP revealed all values WNL except for Glucose at 104, Creatinine at 1.30, Calcium at 8.7, GFR at 59 11/26/17 Hemoglobin value at 13.6.   On review of systems, pt reports stable energy levels, left testicular swelling, and denies fevers, chills, night sweats, unexpected weight loss, CP, SOB, back pains, flank pains, abdominal pains, leg swelling, testicular pain, and any other symptoms.   On PMHx the pt reports CAD, two heart stents placed in 2016. On Social Hx the pt denies smoking.  On Family Hx the pt reports mother with lymphoma of the breast. Two sisters with breast cancer, one at age 76.  Interval History:   Carl [name] returns today for management and evaluation of his Primary testicular Diffuse Large B-Cell Lymphoma. The patient's last visit with Carl [name] was on 03/22/18. The pt reports  that he is doing well overall.   The pt reports that he has had some increased belching after eating his meals, denies acid reflux. He feels that his belching, and previous abdominal fullness, has improved after moving his bowels better. He notes that his bowel movements are "perfect now." He endorses good energy levels and denies concerns for infections, or fevers.   The pt denies back problems, concern for bleeding, and history of seizures.  The pt notes that he continues eating well and endorses stable  weight.  Lab results today (04/05/18) of CBC w/diff and CMP is as follows: all values are WNL except for Abs immature granulocytes at 0.10k, Glucose at 104, Calcium at 8.8.  On review of systems, pt reports improving belching, moving his bowels well, eating well, stable energy levels, stable weight, and denies fevers, chills, concerns of infections, abdominal fullness, concerns for bleeding, abdominal pains, leg swelling, and any other symptoms.    MEDICAL HISTORY:  Past Medical History:  Diagnosis Date  . Arthritis   . Coronary artery disease   . HOH (hard of hearing)   . Hyperlipidemia   . Hypertension   . Ischemic cardiomyopathy   . Myocardial infarction ([place]) 2016  . Quadriceps tendon rupture    LEFT    SURGICAL HISTORY: Past Surgical History:  Procedure Laterality Date  . CARDIAC CATHETERIZATION N/A 10/15/2014   Procedure: Left Heart Cath and Coronary Angiography;  Surgeon: Charolette Forward, MD;  Location: [facility];  Service: Cardiovascular;  Laterality: N/A;  . COLONOSCOPY    . IR IMAGING GUIDED PORT INSERTION  03/14/2018  . MICROLARYNGOSCOPY WITH CO2 LASER AND EXCISION OF VOCAL CORD LESION N/A 11/26/2017   Procedure: MICROLARYNGOSCOPY WITH CO2 LASER AND EXCISION OF VOCAL CORD LESION WITH JET VENTILATION;  Surgeon: Melida Quitter, MD;  Location: [facility];  Service: ENT;  Laterality: N/A;  . QUADRICEPS TENDON REPAIR Left 02/20/2014   Procedure: LEFT REPAIR QUADRICEP TENDON;  Surgeon: Mauri Pole, MD;  Location: [facility];  Service: Orthopedics;  Laterality: Left;  . THROAT SURGERY  2010    SOCIAL HISTORY: Social History   Socioeconomic History  . Marital status: Divorced    Spouse name: Not on file  . Number of children: Not on file  . Years of education: Not on file  . Highest education level: Not on file  Occupational History  . Not on file  Social Needs  . Financial resource strain: Not on file  . Food insecurity:    Worry: Not on file    Inability: Not on file    . Transportation needs:    Medical: Not on file    Non-medical: Not on file  Tobacco Use  . Smoking status: Never Smoker  . Smokeless tobacco: Never Used  Substance and Sexual Activity  . Alcohol use: Yes    Comment: OCCASIONAL  . Drug use: No  . Sexual activity: Not on file  Lifestyle  . Physical activity:    Days per week: Not on file    Minutes per session: Not on file  . Stress: Not on file  Relationships  . Social connections:    Talks on phone: Not on file    Gets together: Not on file    Attends religious service: Not on file    Active member of club or organization: Not on file    Attends meetings of clubs or organizations: Not on file    Relationship status: Not on file  . Intimate  partner violence:    Fear of current or ex partner: Not on file    Emotionally abused: Not on file    Physically abused: Not on file    Forced sexual activity: Not on file  Other Topics Concern  . Not on file  Social History Narrative  . Not on file    FAMILY HISTORY: No family history on file.  ALLERGIES:  has No Known Allergies.  MEDICATIONS:  Current Outpatient Medications  Medication Sig Dispense Refill  . aspirin EC 81 MG tablet Take 81 mg by mouth daily.    Marland Kitchen atorvastatin (LIPITOR) 40 MG tablet Take 1 tablet (40 mg total) by mouth daily at 6 PM. 60 tablet 3  . carvedilol (COREG) 6.25 MG tablet Take 6.25 mg by mouth 2 (two) times daily with a meal.    . cholecalciferol (VITAMIN D) 1000 UNITS tablet Take 1,000 Units by mouth daily.    Marland Kitchen losartan (COZAAR) 25 MG tablet Take 25 mg by mouth every evening.    . Multiple Vitamin (MULTIVITAMIN WITH MINERALS) TABS tablet Take 1 tablet by mouth daily.    . nitroGLYCERIN (NITROSTAT) 0.4 MG SL tablet Place 1 tablet (0.4 mg total) under the tongue every 5 (five) minutes x 3 doses as needed for chest pain. 25 tablet 12  . ondansetron (ZOFRAN) 8 MG tablet Take 1 tablet (8 mg total) by mouth 2 (two) times daily as needed for refractory  nausea / vomiting. Start on day 3 after cyclophosphamide chemotherapy. 30 tablet 1  . Polyethyl Glycol-Propyl Glycol (SYSTANE OP) Place 1 drop into both eyes at bedtime.     . polyethylene glycol (MIRALAX) packet Take 17 g by mouth daily. 30 each 1  . predniSONE (DELTASONE) 20 MG tablet Take 3 tablets (60 mg total) by mouth daily. Take on days 1-5 of chemotherapy. 15 tablet 5  . prochlorperazine (COMPAZINE) 10 MG tablet Take 1 tablet (10 mg total) by mouth every 6 (six) hours as needed (Nausea or vomiting). 30 tablet 6  . senna-docusate (SENNA S) 8.6-50 MG tablet Take 2 tablets by mouth at bedtime as needed for mild constipation or moderate constipation. 60 tablet 1  . white petrolatum (VASELINE) GEL Apply 1 application topically as needed for dry skin (itching).     No current facility-administered medications for this visit.     REVIEW OF SYSTEMS:    A 10+ POINT REVIEW OF SYSTEMS WAS OBTAINED including neurology, dermatology, psychiatry, cardiac, respiratory, lymph, extremities, GI, GU, Musculoskeletal, constitutional, breasts, reproductive, HEENT.  All pertinent positives are noted in the HPI.  All others are negative.   PHYSICAL EXAMINATION: ECOG PERFORMANCE STATUS: 2 - Symptomatic, <50% confined to bed  Vitals:   04/05/18 1154  BP: 128/76  Pulse: 60  Resp: 18  Temp: 97.9 F (36.6 C)  SpO2: 100%   Filed Weights   04/05/18 1154  Weight: 235 lb 11.2 oz (106.9 kg)   .Body mass index is 31.1 kg/m.  GENERAL:alert, in no acute distress and comfortable SKIN: no acute rashes, no significant lesions EYES: conjunctiva are pink and non-injected, sclera anicteric OROPHARYNX: MMM, no exudates, no oropharyngeal erythema or ulceration NECK: supple, no JVD LYMPH:  no palpable lymphadenopathy in the cervical, axillary or inguinal regions LUNGS: clear to auscultation b/l with normal respiratory effort HEART: regular rate & rhythm ABDOMEN:  normoactive bowel sounds , non tender, not  distended. No palpable hepatosplenomegaly.  Extremity: no pedal edema PSYCH: alert & oriented x 3 with fluent speech NEURO: no  focal motor/sensory deficits   LABORATORY DATA:  I have reviewed the data as listed  . CBC Latest Ref Rng & Units 04/05/2018 03/22/2018 03/14/2018  WBC 4.0 - 10.5 K/uL 5.9 20.1(H) 6.2  Hemoglobin 13.0 - 17.0 g/dL 13.7 12.4(L) 13.8  Hematocrit 39.0 - 52.0 % 41.7 38.5(L) 42.8  Platelets 150 - 400 K/uL 200 127(L) 155    . CMP Latest Ref Rng & Units 04/05/2018 03/22/2018 03/14/2018  Glucose 70 - 99 mg/dL 104(H) 128(H) 100(H)  BUN 8 - 23 mg/dL 12 11 16   Creatinine 0.61 - 1.24 mg/dL 0.95 1.05 1.06  Sodium 135 - 145 mmol/L 139 140 139  Potassium 3.5 - 5.1 mmol/L 4.1 4.0 4.1  Chloride 98 - 111 mmol/L 105 103 107  CO2 22 - 32 mmol/L 27 31 26   Calcium 8.9 - 10.3 mg/dL 8.8(L) 8.6(L) 9.0  Total Protein 6.5 - 8.1 g/dL 7.1 6.1(L) 8.0  Total Bilirubin 0.3 - 1.2 mg/dL 0.5 0.9 2.0(H)  Alkaline Phos 38 - 126 U/L 90 162(H) 51  AST 15 - 41 U/L 29 12(L) 28  ALT 0 - 44 U/L 29 17 28    01/05/18 Pathology:    RADIOGRAPHIC STUDIES: I have personally reviewed the radiological images as listed and agreed with the findings in the report. Ir Imaging Guided Port Insertion  Result Date: 03/14/2018 INDICATION: 78 year old male with a history of diffuse large B-cell lymphoma EXAM: IMPLANTED PORT A CATH PLACEMENT WITH ULTRASOUND AND FLUOROSCOPIC GUIDANCE MEDICATIONS: 2.0 g Ancef; The antibiotic was administered within an appropriate time interval prior to skin puncture. ANESTHESIA/SEDATION: Moderate (conscious) sedation was employed during this procedure. A total of Versed 2.0 mg and Fentanyl 100 mcg was administered intravenously. Moderate Sedation Time: 20 minutes. The patient's level of consciousness and vital signs were monitored continuously by radiology nursing throughout the procedure under my direct supervision. FLUOROSCOPY TIME:  0 minutes, 6 seconds (1 mGy) COMPLICATIONS: None  PROCEDURE: The procedure, risks, benefits, and alternatives were explained to the patient. Questions regarding the procedure were encouraged and answered. The patient understands and consents to the procedure. Ultrasound survey was performed with images stored and sent to PACs. The right neck and chest was prepped with chlorhexidine, and draped in the usual sterile fashion using maximum barrier technique (cap and mask, sterile gown, sterile gloves, large sterile sheet, hand hygiene and cutaneous antiseptic). Antibiotic prophylaxis was provided with 2.0g Ancef administered IV one hour prior to skin incision. Local anesthesia was attained by infiltration with 1% lidocaine without epinephrine. Ultrasound demonstrated patency of the right internal jugular vein, and this was documented with an image. Under real-time ultrasound guidance, this vein was accessed with a 21 gauge micropuncture needle and image documentation was performed. A small dermatotomy was made at the access site with an 11 scalpel. A 0.018" wire was advanced into the SVC and used to estimate the length of the internal catheter. The access needle exchanged for a 49F micropuncture vascular sheath. The 0.018" wire was then removed and a 0.035" wire advanced into the IVC. An appropriate location for the subcutaneous reservoir was selected below the clavicle and an incision was made through the skin and underlying soft tissues. The subcutaneous tissues were then dissected using a combination of blunt and sharp surgical technique and a pocket was formed. A single lumen power injectable portacatheter was then tunneled through the subcutaneous tissues from the pocket to the dermatotomy and the port reservoir placed within the subcutaneous pocket. The venous access site was then serially dilated and  a peel away vascular sheath placed over the wire. The wire was removed and the port catheter advanced into position under fluoroscopic guidance. The catheter tip is  positioned in the cavoatrial junction. This was documented with a spot image. The portacatheter was then tested and found to flush and aspirate well. The port was flushed with saline followed by 100 units/mL heparinized saline. The pocket was then closed in two layers using first subdermal inverted interrupted absorbable sutures followed by a running subcuticular suture. The epidermis was then sealed with Dermabond. The dermatotomy at the venous access site was also seal with Dermabond. Patient tolerated the procedure well and remained hemodynamically stable throughout. No complications encountered and no significant blood loss encountered IMPRESSION: Status post right IJ port catheter placement. Catheter ready for use. Signed, Dulcy Fanny. Dellia Nims, RPVI [facility] Radiology Electronically Signed   By: Corrie Mckusick D.O.   On: 03/14/2018 15:50    ASSESSMENT & PLAN:   78 y.o. male with  1. Primary Testicular Diffuse Large B-Cell Lymphoma, Stage 1E 11/30/16 NM Myocar Multi w/spect w/wall motion which revealed a LV EF of 44%  12/08/17 Carl [name] Scrotum revealed Abnormal appearance of the left testicle which is larger than the right and diffusely heterogeneous in echotexture. Cannot exclude infiltrating process/mass. Appearance is concerning for possible neoplasm.  01/05/18 Left testes biopsy revealed Primary Testicular Diffuse Large B-Cell Lymphoma   01/25/18 Hep B, Hep C and HIV negative  01/31/18 ECHO revealed a LV EF of 51%. Left ventricle: The cavity size was normal. Wall motion was normal; there were no regional wall motion abnormalities. Atrial septum: No defect or patent foramen ovale was identified.  02/07/18 PET/CT revealed No findings for metabolically active lymphoma involving the neck, chest, abdomen/pelvis or osseous structures.  PLAN -Discussed pt labwork today, 04/05/18; blood counts are normal, chemistries are stable -The pt has no  prohibitive toxicities from continuing C2 R-mini-CHOP with G-CSF support, at this time. -Continue Senna S two tablets at night time, and Miralax -Discussed again the risks and benefits of IT MTX and the goals for this treatment. The pt endorses understanding the risks associated, and does prefer to pursue this, which we will tentatively begin in C3 -Will refer the pt to our outpatient PT -Have previously discussed this patient's case with my tumor board and the recommendation for 6 cycles of R-mini-CHOP, RT to both testicles, and IT MTX , which the pt agrees with -Continue with infection prevention strategies with the pt including crowd avoidance and wearing a mask while in public -Recommended that the pt continue to eat well, drink at least 48-64 oz of water each day, and walk 20-30 minutes each day.  -Previously recommended that the pt obtain a power of attorney -Will see the pt back in 2 weeks for toxicity check  2.  . Past Medical History:  Diagnosis Date  . Arthritis   . Coronary artery disease   . HOH (hard of hearing)   . Hyperlipidemia   . Hypertension   . Ischemic cardiomyopathy   . Myocardial infarction ([place]) 2016  . Quadriceps tendon rupture    LEFT  -continue f/u with PCP -given systolic dysfunction will rpt ECHO   RTC with Dr Irene Limbo with labs in 2 weeks Referral to cancer rehab for PT in 5-7 days  Plz schedule Intrathecal methotrexate 1-2 days prior to C3 of R-CHOP with interventional radiology Plz schedule C3 of R-CHOP with labs and MD visit as per orders [facility]  flush with each lab   All of the patients questions were answered with apparent satisfaction. The patient knows to call the clinic with any problems, questions or concerns.  The total time spent in the appt was 25 minutes and more than 50% was on counseling and direct patient cares.    Sullivan Lone MD MS AAHIVMS [facility] Hematology/Oncology Physician [facility]  (Office):        [phone] (Work cell):  [phone] (Fax):           [phone]  04/05/2018 12:18 PM  I, Baldwin Jamaica, am acting as a scribe for Dr. Sullivan Lone.   .I have reviewed the above documentation for accuracy and completeness, and I agree with the above. Brunetta Genera MD

## 2018-04-05 ENCOUNTER — Inpatient Hospital Stay: Payer: Medicare HMO

## 2018-04-05 ENCOUNTER — Inpatient Hospital Stay (HOSPITAL_BASED_OUTPATIENT_CLINIC_OR_DEPARTMENT_OTHER): Payer: Medicare HMO | Admitting: Hematology

## 2018-04-05 ENCOUNTER — Telehealth: Payer: Self-pay | Admitting: Hematology

## 2018-04-05 VITALS — BP 102/67 | HR 55 | Temp 97.8°F | Resp 18

## 2018-04-05 VITALS — BP 128/76 | HR 60 | Temp 97.9°F | Resp 18 | Ht 73.0 in | Wt 235.7 lb

## 2018-04-05 DIAGNOSIS — C8339 Diffuse large B-cell lymphoma, extranodal and solid organ sites: Secondary | ICD-10-CM

## 2018-04-05 DIAGNOSIS — C83398 Diffuse large b-cell lymphoma of other extranodal and solid organ sites: Secondary | ICD-10-CM

## 2018-04-05 DIAGNOSIS — R490 Dysphonia: Secondary | ICD-10-CM | POA: Diagnosis not present

## 2018-04-05 DIAGNOSIS — K5900 Constipation, unspecified: Secondary | ICD-10-CM | POA: Diagnosis not present

## 2018-04-05 DIAGNOSIS — I10 Essential (primary) hypertension: Secondary | ICD-10-CM | POA: Diagnosis not present

## 2018-04-05 DIAGNOSIS — Z5111 Encounter for antineoplastic chemotherapy: Secondary | ICD-10-CM | POA: Diagnosis not present

## 2018-04-05 DIAGNOSIS — R140 Abdominal distension (gaseous): Secondary | ICD-10-CM

## 2018-04-05 DIAGNOSIS — Z5189 Encounter for other specified aftercare: Secondary | ICD-10-CM | POA: Diagnosis not present

## 2018-04-05 DIAGNOSIS — R197 Diarrhea, unspecified: Secondary | ICD-10-CM | POA: Diagnosis not present

## 2018-04-05 DIAGNOSIS — C8335 Diffuse large B-cell lymphoma, lymph nodes of inguinal region and lower limb: Secondary | ICD-10-CM | POA: Diagnosis not present

## 2018-04-05 DIAGNOSIS — Z5112 Encounter for antineoplastic immunotherapy: Secondary | ICD-10-CM | POA: Diagnosis not present

## 2018-04-05 DIAGNOSIS — Z95828 Presence of other vascular implants and grafts: Secondary | ICD-10-CM

## 2018-04-05 LAB — CMP (CANCER CENTER ONLY)
ALT: 29 U/L (ref 0–44)
AST: 29 U/L (ref 15–41)
Albumin: 3.6 g/dL (ref 3.5–5.0)
Alkaline Phosphatase: 90 U/L (ref 38–126)
Anion gap: 7 (ref 5–15)
BUN: 12 mg/dL (ref 8–23)
CO2: 27 mmol/L (ref 22–32)
Calcium: 8.8 mg/dL — ABNORMAL LOW (ref 8.9–10.3)
Chloride: 105 mmol/L (ref 98–111)
Creatinine: 0.95 mg/dL (ref 0.61–1.24)
GFR, Est AFR Am: >60 mL/min (ref >60)
GFR, Estimated: >60 mL/min (ref >60)
Glucose, Bld: 104 mg/dL — ABNORMAL HIGH (ref 70–99)
Potassium: 4.1 mmol/L (ref 3.5–5.1)
Sodium: 139 mmol/L (ref 135–145)
Total Bilirubin: 0.5 mg/dL (ref 0.3–1.2)
Total Protein: 7.1 g/dL (ref 6.5–8.1)

## 2018-04-05 LAB — CBC WITH DIFFERENTIAL (CANCER CENTER ONLY)
Abs Immature Granulocytes: 0.1 10*3/uL — ABNORMAL HIGH (ref 0.00–0.07)
Basophils Absolute: 0.1 10*3/uL (ref 0.0–0.1)
Basophils Relative: 1 %
Eosinophils Absolute: 0.1 10*3/uL (ref 0.0–0.5)
Eosinophils Relative: 1 %
HCT: 41.7 % (ref 39.0–52.0)
Hemoglobin: 13.7 g/dL (ref 13.0–17.0)
Immature Granulocytes: 2 %
Lymphocytes Relative: 26 %
Lymphs Abs: 1.5 10*3/uL (ref 0.7–4.0)
MCH: 31.5 pg (ref 26.0–34.0)
MCHC: 32.9 g/dL (ref 30.0–36.0)
MCV: 95.9 fL (ref 80.0–100.0)
Monocytes Absolute: 0.9 10*3/uL (ref 0.1–1.0)
Monocytes Relative: 16 %
Neutro Abs: 3.2 10*3/uL (ref 1.7–7.7)
Neutrophils Relative %: 54 %
Platelet Count: 200 10*3/uL (ref 150–400)
RBC: 4.35 MIL/uL (ref 4.22–5.81)
RDW: 12.9 % (ref 11.5–15.5)
WBC Count: 5.9 10*3/uL (ref 4.0–10.5)
nRBC: 0 % (ref 0.0–0.2)

## 2018-04-05 MED ORDER — SODIUM CHLORIDE 0.9% FLUSH
10.0000 mL | INTRAVENOUS | Status: DC | PRN
  Administered 2018-04-05: 10 mL
  Filled 2018-04-05: qty 10

## 2018-04-05 MED ORDER — VINCRISTINE SULFATE CHEMO INJECTION 1 MG/ML
1.0000 mg | Freq: Once | INTRAVENOUS | Status: AC
  Administered 2018-04-05: 1 mg via INTRAVENOUS
  Filled 2018-04-05: qty 1

## 2018-04-05 MED ORDER — SODIUM CHLORIDE 0.9 % IV SOLN
375.0000 mg/m2 | Freq: Once | INTRAVENOUS | Status: AC
  Administered 2018-04-05: 900 mg via INTRAVENOUS
  Filled 2018-04-05: qty 40

## 2018-04-05 MED ORDER — HEPARIN SOD (PORK) LOCK FLUSH 100 UNIT/ML IV SOLN
500.0000 [IU] | Freq: Once | INTRAVENOUS | Status: AC | PRN
  Administered 2018-04-05: 500 [IU]
  Filled 2018-04-05: qty 5

## 2018-04-05 MED ORDER — SODIUM CHLORIDE 0.9 % IV SOLN
400.0000 mg/m2 | Freq: Once | INTRAVENOUS | Status: AC
  Administered 2018-04-05: 940 mg via INTRAVENOUS
  Filled 2018-04-05: qty 47

## 2018-04-05 MED ORDER — SODIUM CHLORIDE 0.9 % IV SOLN
Freq: Once | INTRAVENOUS | Status: AC
  Administered 2018-04-05: 13:00:00 via INTRAVENOUS
  Filled 2018-04-05: qty 250

## 2018-04-05 MED ORDER — PALONOSETRON HCL INJECTION 0.25 MG/5ML
0.2500 mg | Freq: Once | INTRAVENOUS | Status: AC
  Administered 2018-04-05: 0.25 mg via INTRAVENOUS

## 2018-04-05 MED ORDER — DIPHENHYDRAMINE HCL 25 MG PO CAPS
ORAL_CAPSULE | ORAL | Status: AC
  Filled 2018-04-05: qty 2

## 2018-04-05 MED ORDER — PALONOSETRON HCL INJECTION 0.25 MG/5ML
INTRAVENOUS | Status: AC
  Filled 2018-04-05: qty 5

## 2018-04-05 MED ORDER — ACETAMINOPHEN 325 MG PO TABS
ORAL_TABLET | ORAL | Status: AC
  Filled 2018-04-05: qty 2

## 2018-04-05 MED ORDER — DIPHENHYDRAMINE HCL 25 MG PO CAPS
50.0000 mg | ORAL_CAPSULE | Freq: Once | ORAL | Status: AC
  Administered 2018-04-05: 50 mg via ORAL

## 2018-04-05 MED ORDER — SODIUM CHLORIDE 0.9 % IV SOLN: 375.0000 mg/m2 | Freq: Once | INTRAVENOUS | Status: DC

## 2018-04-05 MED ORDER — SODIUM CHLORIDE 0.9 % IV SOLN
Freq: Once | INTRAVENOUS | Status: AC
  Administered 2018-04-05: 13:00:00 via INTRAVENOUS
  Filled 2018-04-05: qty 5

## 2018-04-05 MED ORDER — DOXORUBICIN HCL CHEMO IV INJECTION 2 MG/ML
25.0000 mg/m2 | Freq: Once | INTRAVENOUS | Status: AC
  Administered 2018-04-05: 58 mg via INTRAVENOUS
  Filled 2018-04-05: qty 29

## 2018-04-05 MED ORDER — ACETAMINOPHEN 325 MG PO TABS
650.0000 mg | ORAL_TABLET | Freq: Once | ORAL | Status: AC
  Administered 2018-04-05: 650 mg via ORAL

## 2018-04-05 NOTE — Patient Instructions (Signed)
Cancer Center Discharge Instructions for Patients Receiving Chemotherapy  Today you received the following chemotherapy agents: Adriamycin (Doxorubicin), Vincristine (Oncovin), Cyclophosphamide (Cytoxan), Rituximab (Rituxan)  To help prevent nausea and vomiting after your treatment, we encourage you to take your nausea medication as directed. Received Aloxi during treatment today-->Take your Compazine prescription (not Zofran) for the next 3 days as needed.   If you develop nausea and vomiting that is not controlled by your nausea medication, call the clinic.   BELOW ARE SYMPTOMS THAT SHOULD BE REPORTED IMMEDIATELY:  *FEVER GREATER THAN 100.5 F  *CHILLS WITH OR WITHOUT FEVER  NAUSEA AND VOMITING THAT IS NOT CONTROLLED WITH YOUR NAUSEA MEDICATION  *UNUSUAL SHORTNESS OF BREATH  *UNUSUAL BRUISING OR BLEEDING  TENDERNESS IN MOUTH AND THROAT WITH OR WITHOUT PRESENCE OF ULCERS  *URINARY PROBLEMS  *BOWEL PROBLEMS  UNUSUAL RASH Items with * indicate a potential emergency and should be followed up as soon as possible.  Feel free to call the clinic should you have any questions or concerns. The clinic phone number is [phone].  Please show the CHEMO ALERT CARD at check-in to the Emergency Department and triage nurse.  Doxorubicin injection What is this medicine? DOXORUBICIN (dox oh ROO bi sin) is a chemotherapy drug. It is used to treat many kinds of cancer like leukemia, lymphoma, neuroblastoma, sarcoma, and Wilms' tumor. It is also used to treat bladder cancer, breast cancer, lung cancer, ovarian cancer, stomach cancer, and thyroid cancer. This medicine may be used for other purposes; ask your health care provider or pharmacist if you have questions. COMMON BRAND NAME(S): Adriamycin, Adriamycin PFS, Adriamycin RDF, Rubex What should I tell my health care provider before I take this medicine? They need to know if you have any of these conditions: -heart  disease -history of low blood counts caused by a medicine -liver disease -recent or ongoing radiation therapy -an unusual or allergic reaction to doxorubicin, other chemotherapy agents, other medicines, foods, dyes, or preservatives -pregnant or trying to get pregnant -breast-feeding How should I use this medicine? This drug is given as an infusion into a vein. It is administered in a hospital or clinic by a specially trained health care professional. If you have pain, swelling, burning or any unusual feeling around the site of your injection, tell your health care professional right away. Talk to your pediatrician regarding the use of this medicine in children. Special care may be needed. Overdosage: If you think you have taken too much of this medicine contact a poison control center or emergency room at once. NOTE: This medicine is only for you. Do not share this medicine with others. What if I miss a dose? It is important not to miss your dose. Call your doctor or health care professional if you are unable to keep an appointment. What may interact with this medicine? This medicine may interact with the following medications: -6-mercaptopurine -paclitaxel -phenytoin -St. John's Wort -trastuzumab -verapamil This list may not describe all possible interactions. Give your health care provider a list of all the medicines, herbs, non-prescription drugs, or dietary supplements you use. Also tell them if you smoke, drink alcohol, or use illegal drugs. Some items may interact with your medicine. What should I watch for while using this medicine? This drug may make you feel generally unwell. This is not uncommon, as chemotherapy can affect healthy cells as well as cancer cells. Report any side effects. Continue your course of treatment even though you feel ill unless your doctor   tells you to stop. There is a maximum amount of this medicine you should receive throughout your life. The amount  depends on the medical condition being treated and your overall health. Your doctor will watch how much of this medicine you receive in your lifetime. Tell your doctor if you have taken this medicine before. You may need blood work done while you are taking this medicine. Your urine may turn red for a few days after your dose. This is not blood. If your urine is dark or brown, call your doctor. In some cases, you may be given additional medicines to help with side effects. Follow all directions for their use. Call your doctor or health care professional for advice if you get a fever, chills or sore throat, or other symptoms of a cold or flu. Do not treat yourself. This drug decreases your body's ability to fight infections. Try to avoid being around people who are sick. This medicine may increase your risk to bruise or bleed. Call your doctor or health care professional if you notice any unusual bleeding. Talk to your doctor about your risk of cancer. You may be more at risk for certain types of cancers if you take this medicine. Do not become pregnant while taking this medicine or for 6 months after stopping it. Women should inform their doctor if they wish to become pregnant or think they might be pregnant. Men should not father a child while taking this medicine and for 6 months after stopping it. There is a potential for serious side effects to an unborn child. Talk to your health care professional or pharmacist for more information. Do not breast-feed an infant while taking this medicine. This medicine has caused ovarian failure in some women and reduced sperm counts in some men This medicine may interfere with the ability to have a child. Talk with your doctor or health care professional if you are concerned about your fertility. This medicine may cause a decrease in Co-Enzyme Q-10. You should make sure that you get enough Co-Enzyme Q-10 while you are taking this medicine. Discuss the foods you eat  and the vitamins you take with your health care professional. What side effects may I notice from receiving this medicine? Side effects that you should report to your doctor or health care professional as soon as possible: -allergic reactions like skin rash, itching or hives, swelling of the face, lips, or tongue -breathing problems -chest pain -fast or irregular heartbeat -low blood counts - this medicine may decrease the number of white blood cells, red blood cells and platelets. You may be at increased risk for infections and bleeding. -pain, redness, or irritation at site where injected -signs of infection - fever or chills, cough, sore throat, pain or difficulty passing urine -signs of decreased platelets or bleeding - bruising, pinpoint red spots on the skin, black, tarry stools, blood in the urine -swelling of the ankles, feet, hands -tiredness -weakness Side effects that usually do not require medical attention (report to your doctor or health care professional if they continue or are bothersome): -diarrhea -hair loss -mouth sores -nail discoloration or damage -nausea -red colored urine -vomiting This list may not describe all possible side effects. Call your doctor for medical advice about side effects. You may report side effects to FDA at 1-800-FDA-1088. Where should I keep my medicine? This drug is given in a hospital or clinic and will not be stored at home. NOTE: This sheet is a summary. It may not   cover all possible information. If you have questions about this medicine, talk to your doctor, pharmacist, or health care provider.  2019 Elsevier/Gold Standard (2016-09-09 11:01:26)  Vincristine injection What is this medicine? VINCRISTINE (vin KRIS teen) is a chemotherapy drug. It slows the growth of cancer cells. This medicine is used to treat many types of cancer like Hodgkin's disease, leukemia, non-Hodgkin's lymphoma, neuroblastoma (brain cancer), rhabdomyosarcoma, and  Wilms' tumor. This medicine may be used for other purposes; ask your health care provider or pharmacist if you have questions. COMMON BRAND NAME(S): Oncovin, Vincasar PFS What should I tell my health care provider before I take this medicine? They need to know if you have any of these conditions: -blood disorders -gout -infection (especially chickenpox, cold sores, or herpes) -kidney disease -liver disease -lung disease -nervous system disease like Charcot-Marie-Tooth (CMT) -recent or ongoing radiation therapy -an unusual or allergic reaction to vincristine, other chemotherapy agents, other medicines, foods, dyes, or preservatives -pregnant or trying to get pregnant -breast-feeding How should I use this medicine? This drug is given as an infusion into a vein. It is administered in a hospital or clinic by a specially trained health care professional. If you have pain, swelling, burning, or any unusual feeling around the site of your injection, tell your health care professional right away. Talk to your pediatrician regarding the use of this medicine in children. While this drug may be prescribed for selected conditions, precautions do apply. Overdosage: If you think you have taken too much of this medicine contact a poison control center or emergency room at once. NOTE: This medicine is only for you. Do not share this medicine with others. What if I miss a dose? It is important not to miss your dose. Call your doctor or health care professional if you are unable to keep an appointment. What may interact with this medicine? Do not take this medicine with any of the following medications: -itraconazole -mibefradil -voriconazole This medicine may also interact with the following medications: -cyclosporine -erythromycin -fluconazole -ketoconazole -medicines for HIV like delavirdine, efavirenz, nevirapine -medicines for seizures like ethotoin, fosphenotoin, phenytoin -medicines to  increase blood counts like filgrastim, pegfilgrastim, sargramostim -other chemotherapy drugs like cisplatin, L-asparaginase, methotrexate, mitomycin, paclitaxel -pegaspargase -vaccines -zalcitabine, ddC Talk to your doctor or health care professional before taking any of these medicines: -acetaminophen -aspirin -ibuprofen -ketoprofen -naproxen This list may not describe all possible interactions. Give your health care provider a list of all the medicines, herbs, non-prescription drugs, or dietary supplements you use. Also tell them if you smoke, drink alcohol, or use illegal drugs. Some items may interact with your medicine. What should I watch for while using this medicine? Your condition will be monitored carefully while you are receiving this medicine. You will need important blood work done while you are taking this medicine. This drug may make you feel generally unwell. This is not uncommon, as chemotherapy can affect healthy cells as well as cancer cells. Report any side effects. Continue your course of treatment even though you feel ill unless your doctor tells you to stop. In some cases, you may be given additional medicines to help with side effects. Follow all directions for their use. Call your doctor or health care professional for advice if you get a fever, chills or sore throat, or other symptoms of a cold or flu. Do not treat yourself. Avoid taking products that contain aspirin, acetaminophen, ibuprofen, naproxen, or ketoprofen unless instructed by your doctor. These medicines may hide a fever. Do   not become pregnant while taking this medicine. Women should inform their doctor if they wish to become pregnant or think they might be pregnant. There is a potential for serious side effects to an unborn child. Talk to your health care professional or pharmacist for more information. Do not breast-feed an infant while taking this medicine. Men may have a lower sperm count while taking  this medicine. Talk to your doctor if you plan to father a child. What side effects may I notice from receiving this medicine? Side effects that you should report to your doctor or health care professional as soon as possible: -allergic reactions like skin rash, itching or hives, swelling of the face, lips, or tongue -breathing problems -confusion or changes in emotions or moods -constipation -cough -mouth sores -muscle weakness -nausea and vomiting -pain, swelling, redness or irritation at the injection site -pain, tingling, numbness in the hands or feet -problems with balance, talking, walking -seizures -stomach pain -trouble passing urine or change in the amount of urine Side effects that usually do not require medical attention (report to your doctor or health care professional if they continue or are bothersome): -diarrhea -hair loss -jaw pain -loss of appetite This list may not describe all possible side effects. Call your doctor for medical advice about side effects. You may report side effects to FDA at 1-800-FDA-1088. Where should I keep my medicine? This drug is given in a hospital or clinic and will not be stored at home. NOTE: This sheet is a summary. It may not cover all possible information. If you have questions about this medicine, talk to your doctor, pharmacist, or health care provider.  2019 Elsevier/Gold Standard (2007-10-24 17:17:13)  Cyclophosphamide injection What is this medicine? CYCLOPHOSPHAMIDE (sye kloe FOSS fa mide) is a chemotherapy drug. It slows the growth of cancer cells. This medicine is used to treat many types of cancer like lymphoma, myeloma, leukemia, breast cancer, and ovarian cancer, to name a few. This medicine may be used for other purposes; ask your health care provider or pharmacist if you have questions. COMMON BRAND NAME(S): Cytoxan, Neosar What should I tell my health care provider before I take this medicine? They need to know if you  have any of these conditions: -blood disorders -history of other chemotherapy -infection -kidney disease -liver disease -recent or ongoing radiation therapy -tumors in the bone marrow -an unusual or allergic reaction to cyclophosphamide, other chemotherapy, other medicines, foods, dyes, or preservatives -pregnant or trying to get pregnant -breast-feeding How should I use this medicine? This drug is usually given as an injection into a vein or muscle or by infusion into a vein. It is administered in a hospital or clinic by a specially trained health care professional. Talk to your pediatrician regarding the use of this medicine in children. Special care may be needed. Overdosage: If you think you have taken too much of this medicine contact a poison control center or emergency room at once. NOTE: This medicine is only for you. Do not share this medicine with others. What if I miss a dose? It is important not to miss your dose. Call your doctor or health care professional if you are unable to keep an appointment. What may interact with this medicine? This medicine may interact with the following medications: -amiodarone -amphotericin B -azathioprine -certain antiviral medicines for HIV or AIDS such as protease inhibitors (e.g., indinavir, ritonavir) and zidovudine -certain blood pressure medications such as benazepril, captopril, enalapril, fosinopril, lisinopril, moexipril, monopril, perindopril, quinapril, ramipril,   trandolapril -certain cancer medications such as anthracyclines (e.g., daunorubicin, doxorubicin), busulfan, cytarabine, paclitaxel, pentostatin, tamoxifen, trastuzumab -certain diuretics such as chlorothiazide, chlorthalidone, hydrochlorothiazide, indapamide, metolazone -certain medicines that treat or prevent blood clots like warfarin -certain muscle relaxants such as succinylcholine -cyclosporine -etanercept -indomethacin -medicines to increase blood counts like  filgrastim, pegfilgrastim, sargramostim -medicines used as general anesthesia -metronidazole -natalizumab This list may not describe all possible interactions. Give your health care provider a list of all the medicines, herbs, non-prescription drugs, or dietary supplements you use. Also tell them if you smoke, drink alcohol, or use illegal drugs. Some items may interact with your medicine. What should I watch for while using this medicine? Visit your doctor for checks on your progress. This drug may make you feel generally unwell. This is not uncommon, as chemotherapy can affect healthy cells as well as cancer cells. Report any side effects. Continue your course of treatment even though you feel ill unless your doctor tells you to stop. Drink water or other fluids as directed. Urinate often, even at night. In some cases, you may be given additional medicines to help with side effects. Follow all directions for their use. Call your doctor or health care professional for advice if you get a fever, chills or sore throat, or other symptoms of a cold or flu. Do not treat yourself. This drug decreases your body's ability to fight infections. Try to avoid being around people who are sick. This medicine may increase your risk to bruise or bleed. Call your doctor or health care professional if you notice any unusual bleeding. Be careful brushing and flossing your teeth or using a toothpick because you may get an infection or bleed more easily. If you have any dental work done, tell your dentist you are receiving this medicine. You may get drowsy or dizzy. Do not drive, use machinery, or do anything that needs mental alertness until you know how this medicine affects you. Do not become pregnant while taking this medicine or for 1 year after stopping it. Women should inform their doctor if they wish to become pregnant or think they might be pregnant. Men should not father a child while taking this medicine and for  4 months after stopping it. There is a potential for serious side effects to an unborn child. Talk to your health care professional or pharmacist for more information. Do not breast-feed an infant while taking this medicine. This medicine may interfere with the ability to have a child. This medicine has caused ovarian failure in some women. This medicine has caused reduced sperm counts in some men. You should talk with your doctor or health care professional if you are concerned about your fertility. If you are going to have surgery, tell your doctor or health care professional that you have taken this medicine. What side effects may I notice from receiving this medicine? Side effects that you should report to your doctor or health care professional as soon as possible: -allergic reactions like skin rash, itching or hives, swelling of the face, lips, or tongue -low blood counts - this medicine may decrease the number of white blood cells, red blood cells and platelets. You may be at increased risk for infections and bleeding. -signs of infection - fever or chills, cough, sore throat, pain or difficulty passing urine -signs of decreased platelets or bleeding - bruising, pinpoint red spots on the skin, black, tarry stools, blood in the urine -signs of decreased red blood cells - unusually weak or tired,   fainting spells, lightheadedness -breathing problems -dark urine -dizziness -palpitations -swelling of the ankles, feet, hands -trouble passing urine or change in the amount of urine -weight gain -yellowing of the eyes or skin Side effects that usually do not require medical attention (report to your doctor or health care professional if they continue or are bothersome): -changes in nail or skin color -hair loss -missed menstrual periods -mouth sores -nausea, vomiting This list may not describe all possible side effects. Call your doctor for medical advice about side effects. You may report side  effects to FDA at 1-800-FDA-1088. Where should I keep my medicine? This drug is given in a hospital or clinic and will not be stored at home. NOTE: This sheet is a summary. It may not cover all possible information. If you have questions about this medicine, talk to your doctor, pharmacist, or health care provider.  2019 Elsevier/Gold Standard (2011-12-11 16:22:58)  Rituximab injection What is this medicine? RITUXIMAB (ri TUX i mab) is a monoclonal antibody. It is used to treat certain types of cancer like non-Hodgkin lymphoma and chronic lymphocytic leukemia. It is also used to treat rheumatoid arthritis, granulomatosis with polyangiitis (or Wegener's granulomatosis), microscopic polyangiitis, and pemphigus vulgaris. This medicine may be used for other purposes; ask your health care provider or pharmacist if you have questions. COMMON BRAND NAME(S): Rituxan What should I tell my health care provider before I take this medicine? They need to know if you have any of these conditions: -heart disease -infection (especially a virus infection such as hepatitis B, chickenpox, cold sores, or herpes) -immune system problems -irregular heartbeat -kidney disease -low blood counts, like low white cell, platelet, or red cell counts -lung or breathing disease, like asthma -recently received or scheduled to receive a vaccine -an unusual or allergic reaction to rituximab, other medicines, foods, dyes, or preservatives -pregnant or trying to get pregnant -breast-feeding How should I use this medicine? This medicine is for infusion into a vein. It is administered in a hospital or clinic by a specially trained health care professional. A special MedGuide will be given to you by the pharmacist with each prescription and refill. Be sure to read this information carefully each time. Talk to your pediatrician regarding the use of this medicine in children. This medicine is not approved for use in  children. Overdosage: If you think you have taken too much of this medicine contact a poison control center or emergency room at once. NOTE: This medicine is only for you. Do not share this medicine with others. What if I miss a dose? It is important not to miss a dose. Call your doctor or health care professional if you are unable to keep an appointment. What may interact with this medicine? -cisplatin -live virus vaccines This list may not describe all possible interactions. Give your health care provider a list of all the medicines, herbs, non-prescription drugs, or dietary supplements you use. Also tell them if you smoke, drink alcohol, or use illegal drugs. Some items may interact with your medicine. What should I watch for while using this medicine? Your condition will be monitored carefully while you are receiving this medicine. You may need blood work done while you are taking this medicine. This medicine can cause serious allergic reactions. To reduce your risk you may need to take medicine before treatment with this medicine. Take your medicine as directed. In some patients, this medicine may cause a serious brain infection that may cause death. If you have any problems   seeing, thinking, speaking, walking, or standing, tell your healthcare professional right away. If you cannot reach your healthcare professional, urgently seek other source of medical care. Call your doctor or health care professional for advice if you get a fever, chills or sore throat, or other symptoms of a cold or flu. Do not treat yourself. This drug decreases your body's ability to fight infections. Try to avoid being around people who are sick. Do not become pregnant while taking this medicine or for 12 months after stopping it. Women should inform their doctor if they wish to become pregnant or think they might be pregnant. There is a potential for serious side effects to an unborn child. Talk to your health care  professional or pharmacist for more information. Do not breast-feed an infant while taking this medicine or for 6 months after stopping it. What side effects may I notice from receiving this medicine? Side effects that you should report to your doctor or health care professional as soon as possible: -allergic reactions like skin rash, itching or hives; swelling of the face, lips, or tongue -breathing problems -chest pain -changes in vision -diarrhea -headache with fever, neck stiffness, sensitivity to light, nausea, or confusion -fast, irregular heartbeat -loss of memory -low blood counts - this medicine may decrease the number of white blood cells, red blood cells and platelets. You may be at increased risk for infections and bleeding. -mouth sores -problems with balance, talking, or walking -redness, blistering, peeling or loosening of the skin, including inside the mouth -signs of infection - fever or chills, cough, sore throat, pain or difficulty passing urine -signs and symptoms of kidney injury like trouble passing urine or change in the amount of urine -signs and symptoms of liver injury like dark yellow or brown urine; general ill feeling or flu-like symptoms; light-colored stools; loss of appetite; nausea; right upper belly pain; unusually weak or tired; yellowing of the eyes or skin -signs and symptoms of low blood pressure like dizziness; feeling faint or lightheaded, falls; unusually weak or tired -stomach pain -swelling of the ankles, feet, hands -unusual bleeding or bruising -vomiting Side effects that usually do not require medical attention (report to your doctor or health care professional if they continue or are bothersome): -headache -joint pain -muscle cramps or muscle pain -nausea -tiredness This list may not describe all possible side effects. Call your doctor for medical advice about side effects. You may report side effects to FDA at 1-800-FDA-1088. Where should  I keep my medicine? This drug is given in a hospital or clinic and will not be stored at home. NOTE: This sheet is a summary. It may not cover all possible information. If you have questions about this medicine, talk to your doctor, pharmacist, or health care provider.  2019 Elsevier/Gold Standard (2017-01-08 13:04:32)  

## 2018-04-05 NOTE — Telephone Encounter (Signed)
Scheduled appt per 02/25 los. ° °Printed calendar and avs. °

## 2018-04-07 ENCOUNTER — Inpatient Hospital Stay: Payer: Medicare HMO

## 2018-04-07 DIAGNOSIS — C8335 Diffuse large B-cell lymphoma, lymph nodes of inguinal region and lower limb: Secondary | ICD-10-CM | POA: Diagnosis not present

## 2018-04-07 DIAGNOSIS — Z5189 Encounter for other specified aftercare: Secondary | ICD-10-CM | POA: Diagnosis not present

## 2018-04-07 DIAGNOSIS — Z5111 Encounter for antineoplastic chemotherapy: Secondary | ICD-10-CM | POA: Diagnosis not present

## 2018-04-07 DIAGNOSIS — R490 Dysphonia: Secondary | ICD-10-CM | POA: Diagnosis not present

## 2018-04-07 DIAGNOSIS — I10 Essential (primary) hypertension: Secondary | ICD-10-CM | POA: Diagnosis not present

## 2018-04-07 DIAGNOSIS — R197 Diarrhea, unspecified: Secondary | ICD-10-CM | POA: Diagnosis not present

## 2018-04-07 DIAGNOSIS — K5900 Constipation, unspecified: Secondary | ICD-10-CM | POA: Diagnosis not present

## 2018-04-07 DIAGNOSIS — R140 Abdominal distension (gaseous): Secondary | ICD-10-CM | POA: Diagnosis not present

## 2018-04-07 DIAGNOSIS — C8339 Diffuse large B-cell lymphoma, extranodal and solid organ sites: Secondary | ICD-10-CM

## 2018-04-07 DIAGNOSIS — Z5112 Encounter for antineoplastic immunotherapy: Secondary | ICD-10-CM | POA: Diagnosis not present

## 2018-04-07 MED ORDER — PEGFILGRASTIM-CBQV 6 MG/0.6ML ~~LOC~~ SOSY
6.0000 mg | PREFILLED_SYRINGE | Freq: Once | SUBCUTANEOUS | Status: AC
  Administered 2018-04-07: 6 mg via SUBCUTANEOUS

## 2018-04-07 MED ORDER — PEGFILGRASTIM-CBQV 6 MG/0.6ML ~~LOC~~ SOSY
PREFILLED_SYRINGE | SUBCUTANEOUS | Status: AC
  Filled 2018-04-07: qty 0.6

## 2018-04-07 NOTE — Patient Instructions (Signed)
Pegfilgrastim injection  What is this medicine?  PEGFILGRASTIM (PEG fil gra stim) is a long-acting granulocyte colony-stimulating factor that stimulates the growth of neutrophils, a type of white blood cell important in the body's fight against infection. It is used to reduce the incidence of fever and infection in patients with certain types of cancer who are receiving chemotherapy that affects the bone marrow, and to increase survival after being exposed to high doses of radiation.  This medicine may be used for other purposes; ask your health care provider or pharmacist if you have questions.  COMMON BRAND NAME(S): Fulphila, Neulasta, UDENYCA  What should I tell my health care provider before I take this medicine?  They need to know if you have any of these conditions:  -kidney disease  -latex allergy  -ongoing radiation therapy  -sickle cell disease  -skin reactions to acrylic adhesives (On-Body Injector only)  -an unusual or allergic reaction to pegfilgrastim, filgrastim, other medicines, foods, dyes, or preservatives  -pregnant or trying to get pregnant  -breast-feeding  How should I use this medicine?  This medicine is for injection under the skin. If you get this medicine at home, you will be taught how to prepare and give the pre-filled syringe or how to use the On-body Injector. Refer to the patient Instructions for Use for detailed instructions. Use exactly as directed. Tell your healthcare provider immediately if you suspect that the On-body Injector may not have performed as intended or if you suspect the use of the On-body Injector resulted in a missed or partial dose.  It is important that you put your used needles and syringes in a special sharps container. Do not put them in a trash can. If you do not have a sharps container, call your pharmacist or healthcare provider to get one.  Talk to your pediatrician regarding the use of this medicine in children. While this drug may be prescribed for  selected conditions, precautions do apply.  Overdosage: If you think you have taken too much of this medicine contact a poison control center or emergency room at once.  NOTE: This medicine is only for you. Do not share this medicine with others.  What if I miss a dose?  It is important not to miss your dose. Call your doctor or health care professional if you miss your dose. If you miss a dose due to an On-body Injector failure or leakage, a new dose should be administered as soon as possible using a single prefilled syringe for manual use.  What may interact with this medicine?  Interactions have not been studied.  Give your health care provider a list of all the medicines, herbs, non-prescription drugs, or dietary supplements you use. Also tell them if you smoke, drink alcohol, or use illegal drugs. Some items may interact with your medicine.  This list may not describe all possible interactions. Give your health care provider a list of all the medicines, herbs, non-prescription drugs, or dietary supplements you use. Also tell them if you smoke, drink alcohol, or use illegal drugs. Some items may interact with your medicine.  What should I watch for while using this medicine?  You may need blood work done while you are taking this medicine.  If you are going to need a MRI, CT scan, or other procedure, tell your doctor that you are using this medicine (On-Body Injector only).  What side effects may I notice from receiving this medicine?  Side effects that you should report to   your doctor or health care professional as soon as possible:  -allergic reactions like skin rash, itching or hives, swelling of the face, lips, or tongue  -back pain  -dizziness  -fever  -pain, redness, or irritation at site where injected  -pinpoint red spots on the skin  -red or dark-brown urine  -shortness of breath or breathing problems  -stomach or side pain, or pain at the shoulder  -swelling  -tiredness  -trouble passing urine or  change in the amount of urine  Side effects that usually do not require medical attention (report to your doctor or health care professional if they continue or are bothersome):  -bone pain  -muscle pain  This list may not describe all possible side effects. Call your doctor for medical advice about side effects. You may report side effects to FDA at 1-800-FDA-1088.  Where should I keep my medicine?  Keep out of the reach of children.  If you are using this medicine at home, you will be instructed on how to store it. Throw away any unused medicine after the expiration date on the label.  NOTE: This sheet is a summary. It may not cover all possible information. If you have questions about this medicine, talk to your doctor, pharmacist, or health care provider.   2019 Elsevier/Gold Standard (2017-05-03 16:57:08)

## 2018-04-18 NOTE — Progress Notes (Signed)
HEMATOLOGY/ONCOLOGY CLINIC NOTE  Date of Service: 04/19/2018  Patient Care Team: [name], MD as PCP - General (Family Medicine)  CHIEF COMPLAINTS/PURPOSE OF CONSULTATION:  Primary testicular diffuse Large B-Cell Lymphoma   HISTORY OF PRESENTING ILLNESS:   Carl [name] is a wonderful 78 y.o. male who has been referred to [place] by Dr. [name] for evaluation and management of Diffuse Large B-Cell Lymphoma. The pt reports that he is doing well overall.  The pt notes that he first began feeling differently about 4-5 months ago, when he noticed left testicular swelling. He notes that the his testicle was "a little bit larger than a golf ball," and that this was not painful. The pt notes that he has not had any abdominal distension or abdominal pain. The pt denies any fevers, chills, night sweats, or unexpected weight loss. He also denies any leg swelling. The pt denies any trauma sustained by the testicles. He denies noticing any other related symptoms.   The pt reports that he believed he had food poisoning prior to this, as he had lots of diarrhea. He notes that he has observed a small knot on the back of his right wrist, which has not changed over the last year.  The pt has two stents in his heart which were placed in 2016. The pt notes that his blood pressure has been stable and well-controlled. He notes that his last ECHO was 6 months ago, and he has not had any CP or SOB since that time. He takes 46m aspirin daily. He also takes Atorvastatin. The pt denies feeling limited in his walking by SOB or CP. He notes that he could walk 2 miles before ceasing to walk, noting that feeling tired in his muscles would limit him.   The pt has recently pursued surgery on his throat with ENT Dr. DMelida Quitterfor altered voice and hoarseness related to his vocal cords.   The pt notes that he currently lives by himself, and is not married. He has a daughter who is 440years old. He  notes that his daughter "tries to help out." He notes that he does not have a power of attorney, but that this would be his daughter.   Of note prior to the patient's visit today, pt has had a Left Testis Surgical biopsy completed on 01/05/18 with results revealing Diffuse Large B-Cell Lymphoma.   The pt also had an [place] on 12/08/17 which revealed Abnormal appearance of the left testicle which is larger than the right and diffusely heterogeneous in echotexture. Cannot exclude infiltrating process/mass. Appearance is concerning for possible neoplasm.  Most recent lab results (11/26/17) of BMP revealed all values WNL except for Glucose at 104, Creatinine at 1.30, Calcium at 8.7, GFR at 59 11/26/17 Hemoglobin value at 13.6.   On review of systems, pt reports stable energy levels, left testicular swelling, and denies fevers, chills, night sweats, unexpected weight loss, CP, SOB, back pains, flank pains, abdominal pains, leg swelling, testicular pain, and any other symptoms.   On PMHx the pt reports CAD, two heart stents placed in 2016. On Social Hx the pt denies smoking.  On Family Hx the pt reports mother with lymphoma of the breast. Two sisters with breast cancer, one at age 80  Interval History:   Carl TROMBLYreturns today for management and evaluation of his Primary testicular Diffuse Large B-Cell Lymphoma after C2 R-mini-CHOP. The patient's last visit with [place] on 04/05/18.  The pt reports that he is doing well overall.   The pt reports that he will be beginning PT this week on 04/22/18. He notes that he has continued to eat very well, noting possibly "too well." He has been walking a little more in the past week, and has found this to be beneficial to his energy levels. He notes that he continues functioning well. The pt notes that he has not had any nausea, but realized he was taking Zofran BID as opposed to as needed BID. The pt denies any concerns for infections, and has been  wearing a mask while in public spaces.  Lab results today (04/19/18) of CBC w/diff and CMP is as follows: all values are WNL except for RBC at 4.02, HGB at 12.7, PLT at 123k, Abs immature granulocytes at 0.23k, Glucose at 128, Calcium at 8.4, Albumin at 3.3, Alk Phos at 134.  On review of systems, pt reports good energy levels, staying active, eating well, and denies nausea, concerns for infections, abdominal pains, leg swelling, and any other symptoms.   MEDICAL HISTORY:  Past Medical History:  Diagnosis Date  . Arthritis   . Coronary artery disease   . HOH (hard of hearing)   . Hyperlipidemia   . Hypertension   . Ischemic cardiomyopathy   . Myocardial infarction ([place]) 2016  . Quadriceps tendon rupture    LEFT    SURGICAL HISTORY: Past Surgical History:  Procedure Laterality Date  . CARDIAC CATHETERIZATION N/A 10/15/2014   Procedure: Left Heart Cath and Coronary Angiography;  Surgeon: Charolette Forward, MD;  Location: [facility];  Service: Cardiovascular;  Laterality: N/A;  . COLONOSCOPY    . IR IMAGING GUIDED PORT INSERTION  03/14/2018  . MICROLARYNGOSCOPY WITH CO2 LASER AND EXCISION OF VOCAL CORD LESION N/A 11/26/2017   Procedure: MICROLARYNGOSCOPY WITH CO2 LASER AND EXCISION OF VOCAL CORD LESION WITH JET VENTILATION;  Surgeon: Melida Quitter, MD;  Location: [facility];  Service: ENT;  Laterality: N/A;  . QUADRICEPS TENDON REPAIR Left 02/20/2014   Procedure: LEFT REPAIR QUADRICEP TENDON;  Surgeon: Mauri Pole, MD;  Location: [facility];  Service: Orthopedics;  Laterality: Left;  . THROAT SURGERY  2010    SOCIAL HISTORY: Social History   Socioeconomic History  . Marital status: Divorced    Spouse name: Not on file  . Number of children: Not on file  . Years of education: Not on file  . Highest education level: Not on file  Occupational History  . Not on file  Social Needs  . Financial resource strain: Not on file  . Food insecurity:    Worry: Not on file    Inability: Not on  file  . Transportation needs:    Medical: Not on file    Non-medical: Not on file  Tobacco Use  . Smoking status: Never Smoker  . Smokeless tobacco: Never Used  Substance and Sexual Activity  . Alcohol use: Yes    Comment: OCCASIONAL  . Drug use: No  . Sexual activity: Not on file  Lifestyle  . Physical activity:    Days per week: Not on file    Minutes per session: Not on file  . Stress: Not on file  Relationships  . Social connections:    Talks on phone: Not on file    Gets together: Not on file    Attends religious service: Not on file    Active member of club or organization: Not on file  Attends meetings of clubs or organizations: Not on file    Relationship status: Not on file  . Intimate partner violence:    Fear of current or ex partner: Not on file    Emotionally abused: Not on file    Physically abused: Not on file    Forced sexual activity: Not on file  Other Topics Concern  . Not on file  Social History Narrative  . Not on file    FAMILY HISTORY: No family history on file.  ALLERGIES:  has No Known Allergies.  MEDICATIONS:  Current Outpatient Medications  Medication Sig Dispense Refill  . aspirin EC 81 MG tablet Take 81 mg by mouth daily.    Marland Kitchen atorvastatin (LIPITOR) 40 MG tablet Take 1 tablet (40 mg total) by mouth daily at 6 PM. 60 tablet 3  . carvedilol (COREG) 6.25 MG tablet Take 6.25 mg by mouth 2 (two) times daily with a meal.    . cholecalciferol (VITAMIN D) 1000 UNITS tablet Take 1,000 Units by mouth daily.    Marland Kitchen losartan (COZAAR) 25 MG tablet Take 25 mg by mouth every evening.    . Multiple Vitamin (MULTIVITAMIN WITH MINERALS) TABS tablet Take 1 tablet by mouth daily.    . nitroGLYCERIN (NITROSTAT) 0.4 MG SL tablet Place 1 tablet (0.4 mg total) under the tongue every 5 (five) minutes x 3 doses as needed for chest pain. 25 tablet 12  . ondansetron (ZOFRAN) 8 MG tablet Take 1 tablet (8 mg total) by mouth 2 (two) times daily as needed for  refractory nausea / vomiting. Start on day 3 after cyclophosphamide chemotherapy. 30 tablet 1  . Polyethyl Glycol-Propyl Glycol (SYSTANE OP) Place 1 drop into both eyes at bedtime.     . polyethylene glycol (MIRALAX) packet Take 17 g by mouth daily. 30 each 1  . predniSONE (DELTASONE) 20 MG tablet Take 3 tablets (60 mg total) by mouth daily. Take on days 1-5 of chemotherapy. 15 tablet 5  . prochlorperazine (COMPAZINE) 10 MG tablet Take 1 tablet (10 mg total) by mouth every 6 (six) hours as needed (Nausea or vomiting). 30 tablet 6  . senna-docusate (SENNA S) 8.6-50 MG tablet Take 2 tablets by mouth at bedtime as needed for mild constipation or moderate constipation. 60 tablet 1  . white petrolatum (VASELINE) GEL Apply 1 application topically as needed for dry skin (itching).     No current facility-administered medications for this visit.     REVIEW OF SYSTEMS:    A 10+ POINT REVIEW OF SYSTEMS WAS OBTAINED including neurology, dermatology, psychiatry, cardiac, respiratory, lymph, extremities, GI, GU, Musculoskeletal, constitutional, breasts, reproductive, HEENT.  All pertinent positives are noted in the HPI.  All others are negative.    PHYSICAL EXAMINATION: ECOG PERFORMANCE STATUS: 2 - Symptomatic, <50% confined to bed  Vitals:   04/19/18 0903  BP: 103/69  Pulse: 70  Resp: 18  Temp: 98.2 F (36.8 C)  SpO2: 99%   Filed Weights   04/19/18 0903  Weight: 239 lb 1.6 oz (108.5 kg)   .Body mass index is 31.55 kg/m.  GENERAL:alert, in no acute distress and comfortable SKIN: no acute rashes, no significant lesions EYES: conjunctiva are pink and non-injected, sclera anicteric OROPHARYNX: MMM, no exudates, no oropharyngeal erythema or ulceration NECK: supple, no JVD LYMPH:  no palpable lymphadenopathy in the cervical, axillary or inguinal regions LUNGS: clear to auscultation b/l with normal respiratory effort HEART: regular rate & rhythm ABDOMEN:  normoactive bowel sounds , non tender,  not distended. No palpable hepatosplenomegaly.  Extremity: no pedal edema PSYCH: alert & oriented x 3 with fluent speech NEURO: no focal motor/sensory deficits   LABORATORY DATA:  I have reviewed the data as listed  . CBC Latest Ref Rng & Units 04/19/2018 04/05/2018 03/22/2018  WBC 4.0 - 10.5 K/uL 10.0 5.9 20.1(H)  Hemoglobin 13.0 - 17.0 g/dL 12.7(L) 13.7 12.4(L)  Hematocrit 39.0 - 52.0 % 39.7 41.7 38.5(L)  Platelets 150 - 400 K/uL 123(L) 200 127(L)    . CMP Latest Ref Rng & Units 04/19/2018 04/05/2018 03/22/2018  Glucose 70 - 99 mg/dL 128(H) 104(H) 128(H)  BUN 8 - 23 mg/dL _0 Creatinine 0.61 - 1.24 mg/dL 1.18 0.95 1.05  Sodium 135 - 145 mmol/L 140 139 140  Potassium 3.5 - 5.1 mmol/L 4.1 4.1 4.0  Chloride 98 - 111 mmol/L 106 105 103  CO2 22 - 32 mmol/L _1 Calcium 8.9 - 10.3 mg/dL 8.4(L) 8.8(L) 8.6(L)  Total Protein 6.5 - 8.1 g/dL 6.5 7.1 6.1(L)  Total Bilirubin 0.3 - 1.2 mg/dL 0.6 0.5 0.9  Alkaline Phos 38 - 126 U/L 134(H) 90 162(H)  AST 15 - 41 U/L 16 29 12(L)  ALT 0 - 44 U/L _2 01/05/18 Pathology:    RADIOGRAPHIC STUDIES: I have personally reviewed the radiological images as listed and agreed with the findings in the report. No results found.  ASSESSMENT & PLAN:   78 y.o. male with  1. Primary Testicular Diffuse Large B-Cell Lymphoma, Stage 1E 11/30/16 NM Myocar Multi w/spect w/wall motion which revealed a LV EF of 44%  12/08/17 US Scrotum revealed Abnormal appearance of the left testicle which is larger than the right and diffusely heterogeneous in echotexture. Cannot exclude infiltrating process/mass. Appearance is concerning for possible neoplasm.  01/05/18 Left testes biopsy revealed Primary Testicular Diffuse Large B-Cell Lymphoma   01/25/18 Hep B, Hep C and HIV negative  01/31/18 ECHO revealed a LV EF of 51%. Left ventricle: The cavity size was normal. Wall motion was normal; there were no regional wall motion abnormalities. Atrial septum:  No defect or patent foramen ovale was identified.  02/07/18 PET/CT revealed No findings for metabolically active lymphoma involving the neck, chest, abdomen/pelvis or osseous structures.  PLAN -Discussed pt labwork today, 04/19/18; blood counts and chemistries are stable -The pt has no prohibitive toxicities from C2 R-mini-CHOP with G-CSF support, and will proceed with C3 on 04/26/18   -The pt has no prohibitive toxicities from beginning IT MTX on 04/25/18 at this time. Will complete a maximum of 4 IT MTX injections, and will adjust this plan according to tolerance as necessary  -Continue Senna S two tablets at night time, and Miralax -Discussed again the risks and benefits of IT MTX and the goals for this treatment. The pt endorses understanding the risks associated, and does prefer to pursue this -Begin outpatient PT -Have previously discussed this patient's case with my tumor board and the recommendation for 6 cycles of R-mini-CHOP, RT to both testicles, and IT MTX , which the pt agrees with -Continue with infection prevention strategies with the pt including crowd avoidance and wearing a mask while in public -Recommended that the pt continue to eat well, drink at least 48-64 oz of water each day, and walk 20-30 minutes each day. -Previously recommended that the pt obtain a power of attorney -Will see the pt back on 04/26/18 for toxicity check from IT MTX  2.  . Past Medical  History:  Diagnosis Date  . Arthritis   . Coronary artery disease   . HOH (hard of hearing)   . Hyperlipidemia   . Hypertension   . Ischemic cardiomyopathy   . Myocardial infarction ([place]) 2016  . Quadriceps tendon rupture    LEFT  -continue f/u with PCP -given systolic dysfunction will rpt ECHO   F/u as scheduled for Intrathecal Methotrexate with IR on 3/16 F/u as scheduled for C3 of R-CHOP 3/17.  Plz add MD visit for 3/17- will see in infusion.   All of the patients questions were answered with apparent  satisfaction. The patient knows to call the clinic with any problems, questions or concerns.  The total time spent in the appt was 20 minutes and more than 50% was on counseling and direct patient cares.    Sullivan Lone MD MS AAHIVMS [facility] Hematology/Oncology Physician [facility]  (Office):       [phone] (Work cell):  [phone] (Fax):           [phone]  04/19/2018 9:58 AM  I, Baldwin Jamaica, am acting as a scribe for Dr. Sullivan Lone.   .I have reviewed the above documentation for accuracy and completeness, and I agree with the above. Brunetta Genera MD

## 2018-04-19 ENCOUNTER — Inpatient Hospital Stay (HOSPITAL_BASED_OUTPATIENT_CLINIC_OR_DEPARTMENT_OTHER): Payer: Medicare HMO | Admitting: Hematology

## 2018-04-19 ENCOUNTER — Inpatient Hospital Stay: Payer: Medicare HMO

## 2018-04-19 ENCOUNTER — Inpatient Hospital Stay: Payer: Medicare HMO | Attending: Hematology

## 2018-04-19 ENCOUNTER — Other Ambulatory Visit: Payer: Self-pay

## 2018-04-19 ENCOUNTER — Ambulatory Visit: Payer: Medicare HMO | Admitting: Hematology

## 2018-04-19 ENCOUNTER — Other Ambulatory Visit: Payer: Medicare HMO

## 2018-04-19 ENCOUNTER — Encounter: Payer: Self-pay | Admitting: Hematology

## 2018-04-19 ENCOUNTER — Telehealth: Payer: Self-pay | Admitting: Hematology

## 2018-04-19 VITALS — BP 103/69 | HR 70 | Temp 98.2°F | Resp 18 | Ht 73.0 in | Wt 239.1 lb

## 2018-04-19 DIAGNOSIS — Z7982 Long term (current) use of aspirin: Secondary | ICD-10-CM | POA: Diagnosis not present

## 2018-04-19 DIAGNOSIS — C8335 Diffuse large B-cell lymphoma, lymph nodes of inguinal region and lower limb: Secondary | ICD-10-CM | POA: Diagnosis not present

## 2018-04-19 DIAGNOSIS — C8339 Diffuse large B-cell lymphoma, extranodal and solid organ sites: Secondary | ICD-10-CM

## 2018-04-19 DIAGNOSIS — Z79899 Other long term (current) drug therapy: Secondary | ICD-10-CM | POA: Diagnosis not present

## 2018-04-19 DIAGNOSIS — K5900 Constipation, unspecified: Secondary | ICD-10-CM

## 2018-04-19 LAB — CBC WITH DIFFERENTIAL/PLATELET
Abs Immature Granulocytes: 0.23 10*3/uL — ABNORMAL HIGH (ref 0.00–0.07)
Basophils Absolute: 0.1 10*3/uL (ref 0.0–0.1)
Basophils Relative: 1 %
Eosinophils Absolute: 0.3 10*3/uL (ref 0.0–0.5)
Eosinophils Relative: 3 %
HCT: 39.7 % (ref 39.0–52.0)
Hemoglobin: 12.7 g/dL — ABNORMAL LOW (ref 13.0–17.0)
Immature Granulocytes: 2 %
Lymphocytes Relative: 13 %
Lymphs Abs: 1.3 10*3/uL (ref 0.7–4.0)
MCH: 31.6 pg (ref 26.0–34.0)
MCHC: 32 g/dL (ref 30.0–36.0)
MCV: 98.8 fL (ref 80.0–100.0)
Monocytes Absolute: 0.8 10*3/uL (ref 0.1–1.0)
Monocytes Relative: 8 %
Neutro Abs: 7.3 10*3/uL (ref 1.7–7.7)
Neutrophils Relative %: 73 %
Platelets: 123 10*3/uL — ABNORMAL LOW (ref 150–400)
RBC: 4.02 MIL/uL — ABNORMAL LOW (ref 4.22–5.81)
RDW: 13.4 % (ref 11.5–15.5)
WBC: 10 10*3/uL (ref 4.0–10.5)
nRBC: 0 % (ref 0.0–0.2)

## 2018-04-19 LAB — PROTIME-INR
INR: 1 (ref 0.8–1.2)
Prothrombin Time: 13 seconds (ref 11.4–15.2)

## 2018-04-19 LAB — CMP (CANCER CENTER ONLY)
ALT: 19 U/L (ref 0–44)
AST: 16 U/L (ref 15–41)
Albumin: 3.3 g/dL — ABNORMAL LOW (ref 3.5–5.0)
Alkaline Phosphatase: 134 U/L — ABNORMAL HIGH (ref 38–126)
Anion gap: 10 (ref 5–15)
BUN: 10 mg/dL (ref 8–23)
CO2: 24 mmol/L (ref 22–32)
Calcium: 8.4 mg/dL — ABNORMAL LOW (ref 8.9–10.3)
Chloride: 106 mmol/L (ref 98–111)
Creatinine: 1.18 mg/dL (ref 0.61–1.24)
GFR, Est AFR Am: >60 mL/min (ref >60)
GFR, Estimated: 58 mL/min — ABNORMAL LOW (ref >60)
Glucose, Bld: 128 mg/dL — ABNORMAL HIGH (ref 70–99)
Potassium: 4.1 mmol/L (ref 3.5–5.1)
Sodium: 140 mmol/L (ref 135–145)
Total Bilirubin: 0.6 mg/dL (ref 0.3–1.2)
Total Protein: 6.5 g/dL (ref 6.5–8.1)

## 2018-04-19 LAB — APTT: aPTT: 32 seconds (ref 24–36)

## 2018-04-19 MED ORDER — ONDANSETRON HCL 8 MG PO TABS: 8.0000 mg | ORAL_TABLET | Freq: Two times a day (BID) | ORAL | 1 refills | Status: DC | PRN

## 2018-04-19 NOTE — Telephone Encounter (Signed)
Gave avs and calendar ° °

## 2018-04-19 NOTE — Patient Instructions (Signed)
Thank you for choosing [facility] to provide your oncology and hematology care.  To afford each patient quality time with our providers, please arrive 30 minutes before your scheduled appointment time.  If you arrive late for your appointment, you may be asked to reschedule.  We strive to give you quality time with our providers, and arriving late affects you and other patients whose appointments are after yours.    If you are a no show for multiple scheduled visits, you may be dismissed from the clinic at the providers discretion.     Again, thank you for choosing [facility], our hope is that these requests will decrease the amount of time that you wait before being seen by our physicians.  ______________________________________________________________________   Should you have questions after your visit to the [facility], please contact our office at [phone] between the hours of 8:30 and 4:30 p.m.    Voicemails left after 4:30p.m will not be returned until the following business day.     For prescription refill requests, please have your pharmacy contact us directly.  Please also try to allow 48 hours for prescription requests.     Please contact the scheduling department for questions regarding scheduling.  For scheduling of procedures such as PET scans, CT scans, MRI, Ultrasound, etc please contact central scheduling at [phone].     Resources For Cancer Patients and Caregivers:    Oncolink.org:  A wonderful resource for patients and healthcare providers for information regarding your disease, ways to tract your treatment, what to expect, etc.      American Cancer Society:  800-227-2345  Can help patients locate various types of support and financial assistance   Cancer Care: 1-800-813-HOPE (4673) Provides financial assistance, online support groups, medication/co-pay assistance.     [place] DSS:  336-641-3447 Where to apply  for food stamps, Medicaid, and utility assistance   Medicare Rights Center: 800-333-4114 Helps people with Medicare understand their rights and benefits, navigate the Medicare system, and secure the quality healthcare they deserve   SCAT: 336-333-6589 [employer] [employer]'s shared-ride transportation service for eligible riders who have a disability that prevents them from riding the fixed route bus.     For additional information on assistance programs please contact our social worker:   Abigail Elmore:  [phone]  

## 2018-04-22 ENCOUNTER — Other Ambulatory Visit: Payer: Self-pay

## 2018-04-22 ENCOUNTER — Ambulatory Visit: Payer: Medicare HMO | Attending: Hematology | Admitting: Physical Therapy

## 2018-04-22 DIAGNOSIS — M6281 Muscle weakness (generalized): Secondary | ICD-10-CM | POA: Diagnosis not present

## 2018-04-22 DIAGNOSIS — R262 Difficulty in walking, not elsewhere classified: Secondary | ICD-10-CM | POA: Diagnosis not present

## 2018-04-22 NOTE — Therapy (Signed)
[facility] [address] [place], [place], [address] Phone: [phone]   Fax:  [phone]  Physical Therapy Evaluation  Patient Details  Name: Carl [name] MRN: [id number] Date of Birth: 09/25/1940 Referring Provider (PT): Dr. Kale    Encounter Date: 04/22/2018  PT End of Session - 04/22/18 1200    Visit Number  1    Number of Visits  24    Date for PT Re-Evaluation  07/23/18    PT Start Time  0845    PT Stop Time  0930    PT Time Calculation (min)  45 min    Activity Tolerance  Patient tolerated treatment well    Behavior During Therapy  WFL for tasks assessed/performed       Past Medical History:  Diagnosis Date  . Arthritis   . Coronary artery disease   . HOH (hard of hearing)   . Hyperlipidemia   . Hypertension   . Ischemic cardiomyopathy   . Myocardial infarction (HCC) 2016  . Quadriceps tendon rupture    LEFT    Past Surgical History:  Procedure Laterality Date  . CARDIAC CATHETERIZATION N/A 10/15/2014   Procedure: Left Heart Cath and Coronary Angiography;  Surgeon: Mohan Harwani, MD;  Location: [facility];  Service: Cardiovascular;  Laterality: N/A;  . COLONOSCOPY    . IR IMAGING GUIDED PORT INSERTION  03/14/2018  . MICROLARYNGOSCOPY WITH CO2 LASER AND EXCISION OF VOCAL CORD LESION N/A 11/26/2017   Procedure: MICROLARYNGOSCOPY WITH CO2 LASER AND EXCISION OF VOCAL CORD LESION WITH JET VENTILATION;  Surgeon: Bates, Dwight, MD;  Location: [facility];  Service: ENT;  Laterality: N/A;  . QUADRICEPS TENDON REPAIR Left 02/20/2014   Procedure: LEFT REPAIR QUADRICEP TENDON;  Surgeon: Matthew D Olin, MD;  Location: [facility];  Service: Orthopedics;  Laterality: Left;  . THROAT SURGERY  2010    There were no vitals filed for this visit.   Subjective Assessment - 04/22/18 0855    Subjective  Pt says he has been walking some and feels better. He will be starting radiation on March 16 and then has another  chemo on Tuesday.  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 Remote history of quadriceps tendon rupture with surgical repair that no longer bothers him     Patient Stated Goals  I want to get well, get that cancer cured and wants to keep moving!    Currently in Pain?  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 Kale     Onset Date/Surgical Date  01/06/18    Hand Dominance  Right      Precautions   Precautions  --   wearing a mask when he is near people      Restrictions   Weight Bearing Restrictions  No      Balance Screen   Has the patient fallen in the past 6 months  No    Has the patient had a decrease in activity level because of a fear of falling?   No    Is the patient reluctant to leave their home   because of a fear of falling?   No      Home Environment   Living Environment  Private residence    Living Arrangements  Alone    Available Help at Discharge  Family   daughter  lives in [place]    Type of Home  House    Home Access  Stairs to enter    Entrance Stairs-Number of Steps  --   1   Home Layout  One level      Prior Function   Level of Independence  Independent    Leisure  pt had been going to exercise classes at [employer] and [employer]but stopped due to chemotherapy       Cognition   Overall Cognitive Status  Within Functional Limits for tasks assessed      Observation/Other Assessments   Observations  pt comes in wearing a mask that he was told to wear whenever he is around people       Sensation   Light Touch  Appears Intact      Coordination   Gross Motor Movements are Fluid and Coordinated  Yes      Functional Tests   Functional tests  Sit to Stand      Sit to  Stand   Comments  11 repetitions in 30, getting out of breath at the end , no pain in the legs       Posture/Postural Control   Posture/Postural Control  No significant limitations      ROM / Strength   AROM / PROM / Strength  AROM;Strength      AROM   Overall AROM   Within functional limits for tasks performed      Strength   Strength Assessment Site  Hand    Right/Left hand  Right;Left    Right Hand Grip (lbs)  95/90/85    Left Hand Grip (lbs)  100/90/80      Ambulation/Gait   Ambulation/Gait  Yes    Assistive device  None    Gait Pattern  Within Functional Limits   long stride length   Gait velocity  5.63 sec or .88 m/sec which is community ambulator       6 Minute Walk- Baseline   6 Minute Walk- Baseline  yes    HR (bpm)  81    02 Sat (%RA)  97 %      6 Minute walk- Post Test   6 Minute Walk Post Test  yes    HR (bpm)  94    02 Sat (%RA)  97 %      6 minute walk test results    Aerobic Endurance Distance Walked  1234    Endurance additional comments  pt felt that he could have walked more       Standardized Balance Assessment   Standardized Balance Assessment  Timed Up and Go Test      Timed Up and Go Test   Normal TUG (seconds)  10.4   no device    TUG Comments  norm for 80 yo is 11.0 +/- 2.2       High Level Balance   High Level Balance Comments  pt able to stand in [place], semi- tandem, tandem, and one legged stance for 10 sec with not evidence of balance loss                 Objective measurements completed on examination: See above findings.                  PT Short Term Goals - 04/22/18 1545      PT SHORT TERM GOAL #1   Title  Pt will report he has a home program for exericse that he can do on a daily basis to maintain his functional level     Time  6    Period  Weeks    Status  New      PT SHORT TERM GOAL #2   Title  Pt will report he is feels that he has enough generalized strength to complete his daily acitivities and  maintain functional independence. during chemotherapy     Time  6    Period  Weeks    Status  New        PT Long Term Goals - 04/22/18 1544      PT LONG TERM GOAL #1   Title  Pt will not have a decrase in TUG score or gait velocity at completion of chemo    Time  12    Period  Weeks    Status  New      PT LONG TERM GOAL #2   Title  Pt will report he will transition to community exercise programs     Time  12    Period  Weeks    Status  New             Plan - 04/22/18 1548    Clinical Impression Statement  Mr. Pekar is and 78 yo male who is undergoing chemotherapy for testicular cancer.  While his functional test scores are WNL for his age, he is not able to participate in his usual exercise routine while undergoing treatment.  He will benefit from PT for exercise for strength, balance, and aerobic endurance  to mainain his funtional independence. Recommend her come to PT 2x a week on weeks 2 and 3 of chemo cycle for exercise monitoring and progression       Personal Factors and Comorbidities  Age;Past/Current Experience;Other   lives alone   Examination-Activity Limitations  Locomotion Level   reports fatigue   Examination-Participation Restrictions  Other   ongoing chemo every 3 weeks    Stability/Clinical Decision Making  Evolving/Moderate complexity    Clinical Decision Making  Moderate    PT Treatment/Interventions  ADLs/Self Care Home Management;Stair training;Gait training;Functional mobility training;Therapeutic exercise;Balance training;Therapeutic activities;Neuromuscular re-education;Patient/family education    PT Next Visit Plan  begin stretching program, LE and UE strengthening, aerobic conditioning, balance training teach HEP    Consulted and Agree with Plan of Care  Patient       Patient will benefit from skilled therapeutic intervention in order to improve the following deficits and impairments:  Decreased activity tolerance, Impaired perceived  functional ability, Decreased knowledge of precautions, Decreased endurance, Difficulty walking  Visit Diagnosis: Difficulty in walking, not elsewhere classified - Plan: PT plan of care cert/re-cert  Muscle weakness (generalized) - Plan: PT plan of care cert/re-cert     Problem List Patient Active Problem List   Diagnosis Date Noted  . Port-A-Cath in place 03/22/2018  . 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 Acute anterolateral wall MI (HCC) 10/15/2014  . Left quad muscle rupture 02/20/2014    K. , PT   ,  Krall 04/22/2018, 3:58 PM  [facility] [address] [place], [place], [address] Phone: [phone]   Fax:  [phone]  Name: Carl [name] MRN: [id number] Date of Birth: 12/02/1940  

## 2018-04-25 ENCOUNTER — Ambulatory Visit (HOSPITAL_COMMUNITY)
Admission: RE | Admit: 2018-04-25 | Discharge: 2018-04-25 | Disposition: A | Discharge status: Home / Self Care | Payer: Medicare HMO | Source: Ambulatory Visit | Attending: Hematology | Admitting: Hematology

## 2018-04-25 ENCOUNTER — Encounter (HOSPITAL_COMMUNITY): Payer: Self-pay

## 2018-04-25 ENCOUNTER — Telehealth: Payer: Self-pay | Admitting: *Deleted

## 2018-04-25 ENCOUNTER — Emergency Department (HOSPITAL_COMMUNITY): Payer: Medicare HMO

## 2018-04-25 ENCOUNTER — Other Ambulatory Visit: Payer: Self-pay

## 2018-04-25 ENCOUNTER — Encounter (HOSPITAL_COMMUNITY): Payer: Self-pay | Admitting: Family Medicine

## 2018-04-25 ENCOUNTER — Inpatient Hospital Stay (HOSPITAL_COMMUNITY)
Admission: EM | Admit: 2018-04-25 | Discharge: 2018-04-27 | DRG: 369 | Disposition: A | Discharge status: Home / Self Care | Payer: Medicare HMO | Attending: Internal Medicine | Admitting: Internal Medicine

## 2018-04-25 VITALS — BP 147/94 | HR 84 | Temp 98.7°F | Resp 16

## 2018-04-25 DIAGNOSIS — R110 Nausea: Secondary | ICD-10-CM | POA: Diagnosis not present

## 2018-04-25 DIAGNOSIS — R531 Weakness: Secondary | ICD-10-CM | POA: Diagnosis present

## 2018-04-25 DIAGNOSIS — C83398 Diffuse large b-cell lymphoma of other extranodal and solid organ sites: Secondary | ICD-10-CM

## 2018-04-25 DIAGNOSIS — C8339 Diffuse large B-cell lymphoma, extranodal and solid organ sites: Secondary | ICD-10-CM | POA: Diagnosis present

## 2018-04-25 DIAGNOSIS — I2510 Atherosclerotic heart disease of native coronary artery without angina pectoris: Secondary | ICD-10-CM | POA: Diagnosis present

## 2018-04-25 DIAGNOSIS — K2951 Unspecified chronic gastritis with bleeding: Secondary | ICD-10-CM | POA: Diagnosis not present

## 2018-04-25 DIAGNOSIS — E785 Hyperlipidemia, unspecified: Secondary | ICD-10-CM | POA: Diagnosis present

## 2018-04-25 DIAGNOSIS — K922 Gastrointestinal hemorrhage, unspecified: Secondary | ICD-10-CM | POA: Diagnosis not present

## 2018-04-25 DIAGNOSIS — K3189 Other diseases of stomach and duodenum: Secondary | ICD-10-CM | POA: Diagnosis not present

## 2018-04-25 DIAGNOSIS — I10 Essential (primary) hypertension: Secondary | ICD-10-CM | POA: Diagnosis present

## 2018-04-25 DIAGNOSIS — D62 Acute posthemorrhagic anemia: Secondary | ICD-10-CM | POA: Diagnosis present

## 2018-04-25 DIAGNOSIS — Z951 Presence of aortocoronary bypass graft: Secondary | ICD-10-CM

## 2018-04-25 DIAGNOSIS — Z955 Presence of coronary angioplasty implant and graft: Secondary | ICD-10-CM | POA: Diagnosis not present

## 2018-04-25 DIAGNOSIS — Z7982 Long term (current) use of aspirin: Secondary | ICD-10-CM | POA: Diagnosis not present

## 2018-04-25 DIAGNOSIS — K920 Hematemesis: Secondary | ICD-10-CM | POA: Diagnosis present

## 2018-04-25 DIAGNOSIS — K2210 Ulcer of esophagus without bleeding: Secondary | ICD-10-CM | POA: Diagnosis present

## 2018-04-25 DIAGNOSIS — K226 Gastro-esophageal laceration-hemorrhage syndrome: Secondary | ICD-10-CM | POA: Diagnosis present

## 2018-04-25 DIAGNOSIS — C6290 Malignant neoplasm of unspecified testis, unspecified whether descended or undescended: Secondary | ICD-10-CM | POA: Diagnosis not present

## 2018-04-25 DIAGNOSIS — R000 Tachycardia, unspecified: Secondary | ICD-10-CM | POA: Diagnosis not present

## 2018-04-25 DIAGNOSIS — R1110 Vomiting, unspecified: Secondary | ICD-10-CM | POA: Diagnosis not present

## 2018-04-25 DIAGNOSIS — R112 Nausea with vomiting, unspecified: Secondary | ICD-10-CM | POA: Diagnosis not present

## 2018-04-25 DIAGNOSIS — R42 Dizziness and giddiness: Secondary | ICD-10-CM | POA: Diagnosis not present

## 2018-04-25 DIAGNOSIS — Z79899 Other long term (current) drug therapy: Secondary | ICD-10-CM

## 2018-04-25 DIAGNOSIS — R1111 Vomiting without nausea: Secondary | ICD-10-CM | POA: Diagnosis not present

## 2018-04-25 LAB — CBC WITH DIFFERENTIAL/PLATELET
Abs Immature Granulocytes: 0.06 10*3/uL (ref 0.00–0.07)
Basophils Absolute: 0 10*3/uL (ref 0.0–0.1)
Basophils Relative: 0 %
Eosinophils Absolute: 0 10*3/uL (ref 0.0–0.5)
Eosinophils Relative: 0 %
HCT: 41.4 % (ref 39.0–52.0)
Hemoglobin: 13.4 g/dL (ref 13.0–17.0)
Immature Granulocytes: 1 %
Lymphocytes Relative: 4 %
Lymphs Abs: 0.5 10*3/uL — ABNORMAL LOW (ref 0.7–4.0)
MCH: 32.5 pg (ref 26.0–34.0)
MCHC: 32.4 g/dL (ref 30.0–36.0)
MCV: 100.5 fL — ABNORMAL HIGH (ref 80.0–100.0)
Monocytes Absolute: 0.1 10*3/uL (ref 0.1–1.0)
Monocytes Relative: 1 %
Neutro Abs: 10.4 10*3/uL — ABNORMAL HIGH (ref 1.7–7.7)
Neutrophils Relative %: 94 %
Platelets: 185 10*3/uL (ref 150–400)
RBC: 4.12 MIL/uL — ABNORMAL LOW (ref 4.22–5.81)
RDW: 13.3 % (ref 11.5–15.5)
WBC: 11 10*3/uL — ABNORMAL HIGH (ref 4.0–10.5)
nRBC: 0 % (ref 0.0–0.2)

## 2018-04-25 LAB — COMPREHENSIVE METABOLIC PANEL
ALT: 24 U/L (ref 0–44)
AST: 24 U/L (ref 15–41)
Albumin: 3.7 g/dL (ref 3.5–5.0)
Alkaline Phosphatase: 94 U/L (ref 38–126)
Anion gap: 7 (ref 5–15)
BUN: 15 mg/dL (ref 8–23)
CO2: 25 mmol/L (ref 22–32)
Calcium: 9 mg/dL (ref 8.9–10.3)
Chloride: 109 mmol/L (ref 98–111)
Creatinine, Ser: 0.95 mg/dL (ref 0.61–1.24)
GFR calc Af Amer: >60 mL/min (ref >60)
GFR calc non Af Amer: >60 mL/min (ref >60)
Glucose, Bld: 160 mg/dL — ABNORMAL HIGH (ref 70–99)
Potassium: 4.2 mmol/L (ref 3.5–5.1)
Sodium: 141 mmol/L (ref 135–145)
Total Bilirubin: 1 mg/dL (ref 0.3–1.2)
Total Protein: 7.3 g/dL (ref 6.5–8.1)

## 2018-04-25 MED ORDER — SODIUM CHLORIDE 0.9 % IV SOLN
Freq: Once | INTRAVENOUS | Status: AC
  Administered 2018-04-25: 10:00:00 via INTRAVENOUS
  Filled 2018-04-25: qty 4

## 2018-04-25 MED ORDER — SODIUM CHLORIDE 0.9 % IV BOLUS
500.0000 mL | Freq: Once | INTRAVENOUS | Status: AC
  Administered 2018-04-25: 500 mL via INTRAVENOUS

## 2018-04-25 MED ORDER — SODIUM CHLORIDE (PF) 0.9 % IJ SOLN
Freq: Once | INTRAMUSCULAR | Status: AC
  Administered 2018-04-25: 11:00:00 via INTRATHECAL
  Filled 2018-04-25: qty 0.48

## 2018-04-25 MED ORDER — LIDOCAINE HCL 1 % IJ SOLN
INTRAMUSCULAR | Status: AC
  Filled 2018-04-25: qty 20

## 2018-04-25 MED ORDER — SODIUM CHLORIDE 0.9 % IV SOLN
Freq: Once | INTRAVENOUS | Status: AC
  Administered 2018-04-25: 10:00:00 via INTRAVENOUS

## 2018-04-25 MED ORDER — ONDANSETRON HCL 4 MG/2ML IJ SOLN
4.0000 mg | Freq: Once | INTRAMUSCULAR | Status: AC
  Administered 2018-04-25: 4 mg via INTRAVENOUS
  Filled 2018-04-25: qty 2

## 2018-04-25 NOTE — Discharge Instructions (Signed)
Lumbar Puncture, Care After  This sheet gives you information about how to care for yourself after your procedure. Your health care provider may also give you more specific instructions. If you have problems or questions, contact your health care provider.  What can I expect after the procedure?  After the procedure, it is common to have:   Mild discomfort or pain at the puncture site.   A mild headache that is relieved with pain medicines.  Follow these instructions at home:  Activity     Lie down flat or rest for as long as directed by your health care provider.   Return to your normal activities as told by your health care provider. Ask your health care provider what activities are safe for you.   Avoid lifting anything heavier than 10 lb (4.5 kg) for at least 12 hours after the procedure.   Do not drive for 24 hours if you were given a medicine to help you relax (sedative) during your procedure.   Do not drive or use heavy machinery while taking prescription pain medicine.  Puncture site care   Remove or change your bandage (dressing) as told by your health care provider.   Check your puncture area every day for signs of infection. Check for:  ? More pain.  ? Redness or swelling.  ? Fluid or blood leaking from the puncture site.  ? Warmth.  ? Pus or a bad smell.  General instructions   Take over-the-counter and prescription medicines only as told by your health care provider.   Drink enough fluids to keep your urine clear or pale yellow. Your health care provider may recommend drinking caffeine to prevent a headache.   Keep all follow-up visits as told by your health care provider. This is important.  Contact a health care provider if:   You have fever or chills.   You have nausea or vomiting.   You have a headache that lasts for more than 2 days or does not get better with medicine.  Get help right away if:   You develop any of the following in your  legs:  ? Weakness.  ? Numbness.  ? Tingling.   You are unable to control when you urinate or have a bowel movement (incontinence).   You have signs of infection around your puncture site, such as:  ? More pain.  ? Redness or swelling.  ? Fluid or blood leakage.  ? Warmth.  ? Pus or a bad smell.   You are dizzy or you feel like you might faint.   You have a severe headache, especially when you sit or stand.  Summary   A lumbar puncture is a procedure in which a small needle is inserted into the lower back to remove fluid that surrounds the brain and spinal cord.   After this procedure, it is common to have a headache and pain around the needle insertion area.   Lying flat, staying hydrated, and drinking caffeine can help prevent headaches.   Monitor your needle insertion site for signs of infection, including warmth, fluid, or more pain.   Get help right away if you develop leg weakness, leg numbness, incontinence, or severe headaches.  This information is not intended to replace advice given to you by your health care provider. Make sure you discuss any questions you have with your health care provider.  Document Released: 01/31/2013 Document Revised: 03/11/2016 Document Reviewed: 03/11/2016  Elsevier Interactive Patient Education  2019 Elsevier Inc[employer]

## 2018-04-25 NOTE — ED Notes (Signed)
Bed: WA04 Expected date:  Expected time:  Means of arrival:  Comments: GI Bleed

## 2018-04-25 NOTE — Telephone Encounter (Signed)
Verbal order received and read back from Dr. Irene Limbo for Peripheral IV start.  Order given to [employer], [facility] at this time.    Rollene Fare, Radiology requesting provider call [facility] with PIV order for [employer].  Angus Palms RN, [facility] Day called radiology for orders we're not able to give.  Call Medical Day, [phone] with provider orders.  Pre-intrathecal chemotherapy orders include zofran IV and a steroid IV.

## 2018-04-25 NOTE — ED Notes (Signed)
Patient states he started experiencing dizziness with the nausea/vomiting.

## 2018-04-25 NOTE — ED Provider Notes (Signed)
[facility] DEPT Provider Note   CSN: [id number] Arrival date & time: 04/25/18  2134    History   Chief Complaint Chief Complaint  Patient presents with  . Hematemesis    HPI Carl [name] is a 77 y.o. male possible history of MI, CAD, hyperlipidemia, hypertension, lymphoma, currently receiving chemotherapy who presents for evaluation of hematemesis.  Patient reports that he had a chemo session today.  While at chemo, he started feeling nausea dizziness.  Patient states he was given a soda to try and help with symptoms.  He states that he had one episode of vomiting at about 4 PM and then at 10 PM, he had an episode of vomiting that he states was bright red like blood.  He did not notice any coffee-ground emesis.  He has not had any other episodes of vomiting since then.  He is currently on aspirin but denies any other blood thinners.  Patient states that he is not currently having any vision change, chest pain, difficulty breathing, abdominal pain, numbness/weakness of his arms or legs.     The history is provided by the patient.    Past Medical History:  Diagnosis Date  . Arthritis   . Coronary artery disease   . HOH (hard of hearing)   . Hyperlipidemia   . Hypertension   . Ischemic cardiomyopathy   . Myocardial infarction ([facility]) 2016  . Quadriceps tendon rupture    LEFT    Patient Active Problem List   Diagnosis Date Noted  . Acute GI bleeding 04/26/2018  . Port-A-Cath in place 03/22/2018  . 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 Acute anterolateral wall MI ([facility]) 10/15/2014  . Left quad muscle rupture 02/20/2014    Past Surgical History:  Procedure Laterality Date  . CARDIAC CATHETERIZATION N/A 10/15/2014   Procedure: Left Heart Cath and Coronary Angiography;  Surgeon: Charolette Forward, MD;  Location: [facility];  Service: Cardiovascular;  Laterality: N/A;  . COLONOSCOPY    . IR  IMAGING GUIDED PORT INSERTION  03/14/2018  . MICROLARYNGOSCOPY WITH CO2 LASER AND EXCISION OF VOCAL CORD LESION N/A 11/26/2017   Procedure: MICROLARYNGOSCOPY WITH CO2 LASER AND EXCISION OF VOCAL CORD LESION WITH JET VENTILATION;  Surgeon: Melida Quitter, MD;  Location: [facility];  Service: ENT;  Laterality: N/A;  . QUADRICEPS TENDON REPAIR Left 02/20/2014   Procedure: LEFT REPAIR QUADRICEP TENDON;  Surgeon: Mauri Pole, MD;  Location: [facility];  Service: Orthopedics;  Laterality: Left;  . THROAT SURGERY  2010        Home Medications    Prior to Admission medications   Medication Sig Start Date End Date Taking? Authorizing Provider  aspirin EC 81 MG tablet Take 81 mg by mouth daily.   Yes [provider]  atorvastatin (LIPITOR) 40 MG tablet Take 1 tablet (40 mg total) by mouth daily at 6 PM. 10/19/14  Yes Charolette Forward, MD  carvedilol (COREG) 6.25 MG tablet Take 6.25 mg by mouth 2 (two) times daily with a meal.   Yes [provider]  cholecalciferol (VITAMIN D) 1000 UNITS tablet Take 1,000 Units by mouth daily.   Yes [provider]  losartan (COZAAR) 25 MG tablet Take 25 mg by mouth every evening.   Yes [provider]  Multiple Vitamin (MULTIVITAMIN WITH MINERALS) TABS tablet Take 1 tablet by mouth daily.   Yes [provider]  nitroGLYCERIN (NITROSTAT) 0.4 MG SL tablet Place  1 tablet (0.4 mg total) under the tongue every 5 (five) minutes x 3 doses as needed for chest pain. 10/19/14  Yes Charolette Forward, MD  ondansetron (ZOFRAN) 8 MG tablet Take 1 tablet (8 mg total) by mouth 2 (two) times daily as needed for refractory nausea / vomiting. Start on day 3 after cyclophosphamide chemotherapy. 04/19/18  Yes Brunetta Genera, MD  Polyethyl Glycol-Propyl Glycol (SYSTANE OP) Place 1 drop into both eyes at bedtime.    Yes [provider]  polyethylene glycol (MIRALAX) packet Take 17 g by mouth daily. 03/22/18  Yes Brunetta Genera, MD  predniSONE  (DELTASONE) 20 MG tablet Take 3 tablets (60 mg total) by mouth daily. Take on days 1-5 of chemotherapy. 03/03/18  Yes Brunetta Genera, MD  prochlorperazine (COMPAZINE) 10 MG tablet Take 1 tablet (10 mg total) by mouth every 6 (six) hours as needed (Nausea or vomiting). 03/03/18  Yes Brunetta Genera, MD  senna-docusate (SENNA S) 8.6-50 MG tablet Take 2 tablets by mouth at bedtime as needed for mild constipation or moderate constipation. 03/22/18  Yes Brunetta Genera, MD  white petrolatum (VASELINE) GEL Apply 1 application topically as needed for dry skin (itching).   Yes [provider]    Family History History reviewed. No pertinent family history.  Social History Social History   Tobacco Use  . Smoking status: Never Smoker  . Smokeless tobacco: Never Used  Substance Use Topics  . Alcohol use: Not Currently  . Drug use: No     Allergies   Patient has no known allergies.   Review of Systems Review of Systems  Constitutional: Negative for fever.  Respiratory: Negative for cough and shortness of breath.   Cardiovascular: Negative for chest pain.  Gastrointestinal: Positive for nausea and vomiting. Negative for abdominal pain, blood in stool and diarrhea.  Genitourinary: Negative for dysuria and hematuria.  Neurological: Positive for dizziness and light-headedness. Negative for headaches.  All other systems reviewed and are negative.    Physical Exam Updated Vital Signs BP (!) 139/101 (BP Location: Right Arm)   Pulse 82   Temp 98 F (36.7 C) (Oral)   Resp 16   Ht 6\' 1"  (1.854 m)   Wt 108 kg   SpO2 96%   BMI 31.41 kg/m   Physical Exam Vitals signs and nursing note reviewed.  Constitutional:      Appearance: Normal appearance. He is well-developed.  HENT:     Head: Normocephalic and atraumatic.  Eyes:     General: Lids are normal.     Conjunctiva/sclera: Conjunctivae normal.     Pupils: Pupils are equal, round, and reactive to light.  Neck:      Musculoskeletal: Full passive range of motion without pain.  Cardiovascular:     Rate and Rhythm: Normal rate and regular rhythm.     Pulses: Normal pulses.          Radial pulses are 2+ on the right side and 2+ on the left side.       Dorsalis pedis pulses are 2+ on the right side and 2+ on the left side.     Heart sounds: Normal heart sounds. No murmur. No friction rub. No gallop.   Pulmonary:     Effort: Pulmonary effort is normal.     Breath sounds: Normal breath sounds.     Comments: Lungs clear to auscultation bilaterally.  Symmetric chest rise.  No wheezing, rales, rhonchi. Abdominal:     Palpations: Abdomen  is soft. Abdomen is not rigid.     Tenderness: There is no abdominal tenderness. There is no guarding.     Comments: Abdomen is soft, non-distended, non-tender. No rigidity, No guarding. No peritoneal signs.  Musculoskeletal: Normal range of motion.  Skin:    General: Skin is warm and dry.     Capillary Refill: Capillary refill takes less than 2 seconds.  Neurological:     Mental Status: He is alert and oriented to person, place, and time.  Psychiatric:        Speech: Speech normal.      ED Treatments / Results  Labs (all labs ordered are listed, but only abnormal results are displayed) Labs Reviewed  COMPREHENSIVE METABOLIC PANEL - Abnormal; Notable for the following components:      Result Value   Glucose, Bld 160 (*)    All other components within normal limits  CBC WITH DIFFERENTIAL/PLATELET - Abnormal; Notable for the following components:   WBC 11.0 (*)    RBC 4.12 (*)    MCV 100.5 (*)    Neutro Abs 10.4 (*)    Lymphs Abs 0.5 (*)    All other components within normal limits    EKG EKG Interpretation  Date/Time:  Tuesday April 26 2018 01:47:27 EDT Ventricular Rate:  86 PR Interval:    QRS Duration: 88 QT Interval:  371 QTC Calculation: 444 R Axis:   7 Text Interpretation:  Sinus rhythm Prominent P waves, nondiagnostic Anterior infarct, old No  significant change was found Confirmed by Ezequiel Essex [phone]) on 04/26/2018 2:02:07 AM   Radiology Dg Chest 2 View  Result Date: 04/25/2018 CLINICAL DATA:  Vomiting EXAM: CHEST - 2 VIEW COMPARISON:  10/15/2014 FINDINGS: Right Port-A-Cath in place with the tip in the SVC. Heart is upper limits normal in size. No confluent airspace opacities or effusions. No acute bony abnormality. IMPRESSION: No active cardiopulmonary disease. Electronically Signed   By: Rolm Baptise M.D.   On: 04/25/2018 23:59   Dg Fl Guided Theraputic Lumbar Puncture  Result Date: 04/25/2018 CLINICAL DATA:  Testicular B-cell lymphoma. Prophylactic intrathecal chemotherapy. EXAM: FLUOROSCOPICALLY GUIDED LUMBAR PUNCTURE FOR INTRATHECAL CHEMOTHERAPY FLUOROSCOPY TIME:  1 minutes 6 seconds PROCEDURE: Informed consent was obtained from the patient prior to the procedure, including potential complications of headache, allergy, and pain. With the patient prone, the lower back was prepped with Betadine. 1% Lidocaine was used for local anesthesia. Lumbar puncture was performed at the L5-S1 level using a gauge needle with return of L5-S1CSF. 15 mg of methotrexate was injected into the subarachnoid space. The patient tolerated the procedure well without apparent complication. IMPRESSION: Successful intrathecal administration of chemotherapy. First treatment. Electronically Signed   By: Suzy Bouchard M.D.   On: 04/25/2018 12:00    Procedures Procedures (including critical care time)  Medications Ordered in ED Medications  sodium chloride 0.9 % bolus 500 mL (0 mLs Intravenous Stopped 04/26/18 0055)  ondansetron (ZOFRAN) injection 4 mg (4 mg Intravenous Given 04/25/18 2341)     Initial Impression / Assessment and Plan / ED Course  I have reviewed the triage vital signs and the nursing notes.  Pertinent labs & imaging results that were available during my care of the patient were reviewed by me and considered in my medical decision  making (see chart for details).        78 year old male past mostly of lymphoma who presents for evaluation of hematemesis.  Reports at home, he had an episode where he vomited bright  red blood.  No coffee-ground emesis.  No blood in stool.  Also reports some lightheadedness sensation.  No chest pain, difficulty breathing.  We will plan to check labs.  CMP shows slight hyperglycemia.  Otherwise unremarkable.  CBC shows leukocytosis of 11.0.  Hemoglobin is stable at 13.4.  Otherwise unremarkable.  Chest x-ray negative for any acute abnormalities.  Reevaluation.  Patient states he still feels very nauseous and very dizzy.  He is not able to drink anything because he feels like he will throw it right back up.  Given that he is symptomatic and concerns for age, complicated medical history, will plan for admission for observation and serial hemoglobins to ensure no downtrending hemoglobin.  Discussed patient with Dr. Hal Hope (hospitalist). Will accept patient for admission.   Portions of this note were generated with [employer]. Dictation errors may occur despite best attempts at proofreading.  Final Clinical Impressions(s) / ED Diagnoses   Final diagnoses:  Hematemesis, presence of nausea not specified  Nausea  Lightheaded    ED Discharge Orders    None       Desma Mcgregor 04/26/18 0237    Ezequiel Essex, MD 04/26/18 [phone]

## 2018-04-25 NOTE — ED Triage Notes (Signed)
Patient is from home and transported via [facility]. Patient has a history of primary testicular lymphoma and received chemotherapy today. During chemotherapy, patient started experiencing nausea. Symptom progressed to vomiting, bright red blood. EMS obtained two IV's and administered Zofran 4mg  IV.

## 2018-04-26 ENCOUNTER — Encounter (HOSPITAL_COMMUNITY): Payer: Self-pay | Admitting: Internal Medicine

## 2018-04-26 ENCOUNTER — Other Ambulatory Visit: Payer: Medicare HMO

## 2018-04-26 ENCOUNTER — Ambulatory Visit: Payer: Medicare HMO | Admitting: Hematology

## 2018-04-26 ENCOUNTER — Ambulatory Visit: Payer: Medicare HMO

## 2018-04-26 DIAGNOSIS — K2210 Ulcer of esophagus without bleeding: Secondary | ICD-10-CM | POA: Diagnosis present

## 2018-04-26 DIAGNOSIS — K922 Gastrointestinal hemorrhage, unspecified: Secondary | ICD-10-CM | POA: Diagnosis not present

## 2018-04-26 DIAGNOSIS — K920 Hematemesis: Secondary | ICD-10-CM | POA: Diagnosis present

## 2018-04-26 DIAGNOSIS — Z951 Presence of aortocoronary bypass graft: Secondary | ICD-10-CM | POA: Diagnosis not present

## 2018-04-26 DIAGNOSIS — Z79899 Other long term (current) drug therapy: Secondary | ICD-10-CM | POA: Diagnosis not present

## 2018-04-26 DIAGNOSIS — K226 Gastro-esophageal laceration-hemorrhage syndrome: Secondary | ICD-10-CM | POA: Diagnosis present

## 2018-04-26 DIAGNOSIS — I10 Essential (primary) hypertension: Secondary | ICD-10-CM | POA: Diagnosis present

## 2018-04-26 DIAGNOSIS — C8339 Diffuse large B-cell lymphoma, extranodal and solid organ sites: Secondary | ICD-10-CM | POA: Diagnosis present

## 2018-04-26 DIAGNOSIS — Z7982 Long term (current) use of aspirin: Secondary | ICD-10-CM | POA: Diagnosis not present

## 2018-04-26 DIAGNOSIS — Z955 Presence of coronary angioplasty implant and graft: Secondary | ICD-10-CM | POA: Diagnosis not present

## 2018-04-26 DIAGNOSIS — R110 Nausea: Secondary | ICD-10-CM

## 2018-04-26 DIAGNOSIS — I2510 Atherosclerotic heart disease of native coronary artery without angina pectoris: Secondary | ICD-10-CM | POA: Diagnosis present

## 2018-04-26 DIAGNOSIS — E785 Hyperlipidemia, unspecified: Secondary | ICD-10-CM | POA: Diagnosis present

## 2018-04-26 DIAGNOSIS — R531 Weakness: Secondary | ICD-10-CM | POA: Diagnosis present

## 2018-04-26 DIAGNOSIS — D62 Acute posthemorrhagic anemia: Secondary | ICD-10-CM | POA: Diagnosis present

## 2018-04-26 LAB — CBC
HCT: 36.8 % — ABNORMAL LOW (ref 39.0–52.0)
HCT: 33.5 % — ABNORMAL LOW (ref 39.0–52.0)
Hemoglobin: 11.5 g/dL — ABNORMAL LOW (ref 13.0–17.0)
Hemoglobin: 10.7 g/dL — ABNORMAL LOW (ref 13.0–17.0)
MCH: 32.2 pg (ref 26.0–34.0)
MCH: 32.7 pg (ref 26.0–34.0)
MCHC: 31.3 g/dL (ref 30.0–36.0)
MCHC: 31.9 g/dL (ref 30.0–36.0)
MCV: 103.1 fL — ABNORMAL HIGH (ref 80.0–100.0)
MCV: 102.4 fL — ABNORMAL HIGH (ref 80.0–100.0)
Platelets: 184 10*3/uL (ref 150–400)
Platelets: 180 10*3/uL (ref 150–400)
RBC: 3.57 MIL/uL — ABNORMAL LOW (ref 4.22–5.81)
RBC: 3.27 MIL/uL — ABNORMAL LOW (ref 4.22–5.81)
RDW: 13.3 % (ref 11.5–15.5)
RDW: 13.5 % (ref 11.5–15.5)
WBC: 18.3 10*3/uL — ABNORMAL HIGH (ref 4.0–10.5)
WBC: 26.8 10*3/uL — ABNORMAL HIGH (ref 4.0–10.5)
nRBC: 0 % (ref 0.0–0.2)
nRBC: 0 % (ref 0.0–0.2)

## 2018-04-26 LAB — BASIC METABOLIC PANEL
Anion gap: 7 (ref 5–15)
BUN: 20 mg/dL (ref 8–23)
CO2: 23 mmol/L (ref 22–32)
Calcium: 8.5 mg/dL — ABNORMAL LOW (ref 8.9–10.3)
Chloride: 112 mmol/L — ABNORMAL HIGH (ref 98–111)
Creatinine, Ser: 0.87 mg/dL (ref 0.61–1.24)
GFR calc Af Amer: >60 mL/min (ref >60)
GFR calc non Af Amer: >60 mL/min (ref >60)
Glucose, Bld: 147 mg/dL — ABNORMAL HIGH (ref 70–99)
Potassium: 4.4 mmol/L (ref 3.5–5.1)
Sodium: 142 mmol/L (ref 135–145)

## 2018-04-26 LAB — HEPATIC FUNCTION PANEL
ALT: 19 U/L (ref 0–44)
AST: 18 U/L (ref 15–41)
Albumin: 3 g/dL — ABNORMAL LOW (ref 3.5–5.0)
Alkaline Phosphatase: 74 U/L (ref 38–126)
Bilirubin, Direct: 0.2 mg/dL (ref 0.0–0.2)
Indirect Bilirubin: 0.7 mg/dL (ref 0.3–0.9)
Total Bilirubin: 0.9 mg/dL (ref 0.3–1.2)
Total Protein: 5.7 g/dL — ABNORMAL LOW (ref 6.5–8.1)

## 2018-04-26 LAB — GLUCOSE, CAPILLARY
Glucose-Capillary: 139 mg/dL — ABNORMAL HIGH (ref 70–99)
Glucose-Capillary: 83 mg/dL (ref 70–99)

## 2018-04-26 LAB — LACTIC ACID, PLASMA: Lactic Acid, Venous: 1.6 mmol/L (ref 0.5–1.9)

## 2018-04-26 MED ORDER — PANTOPRAZOLE SODIUM 40 MG IV SOLR: 40.0000 mg | Freq: Two times a day (BID) | INTRAVENOUS | Status: DC

## 2018-04-26 MED ORDER — ATORVASTATIN CALCIUM 40 MG PO TABS
40.0000 mg | ORAL_TABLET | Freq: Every day | ORAL | Status: DC
  Administered 2018-04-26: 40 mg via ORAL
  Filled 2018-04-26: qty 1

## 2018-04-26 MED ORDER — ONDANSETRON HCL 4 MG/2ML IJ SOLN: 4.0000 mg | Freq: Four times a day (QID) | INTRAMUSCULAR | Status: DC | PRN

## 2018-04-26 MED ORDER — ONDANSETRON HCL 4 MG PO TABS: 4.0000 mg | ORAL_TABLET | Freq: Four times a day (QID) | ORAL | Status: DC | PRN

## 2018-04-26 MED ORDER — SODIUM CHLORIDE 0.9 % IV SOLN
8.0000 mg/h | INTRAVENOUS | Status: DC
  Administered 2018-04-26 – 2018-04-27 (×3): 8 mg/h via INTRAVENOUS
  Filled 2018-04-26 (×4): qty 80

## 2018-04-26 MED ORDER — ACETAMINOPHEN 650 MG RE SUPP: 650.0000 mg | Freq: Four times a day (QID) | RECTAL | Status: DC | PRN

## 2018-04-26 MED ORDER — SODIUM CHLORIDE 0.9 % IV SOLN
80.0000 mg | Freq: Once | INTRAVENOUS | Status: AC
  Administered 2018-04-26: 80 mg via INTRAVENOUS
  Filled 2018-04-26: qty 80

## 2018-04-26 MED ORDER — SODIUM CHLORIDE 0.9 % IV SOLN: INTRAVENOUS | Status: DC

## 2018-04-26 MED ORDER — CARVEDILOL 6.25 MG PO TABS
6.2500 mg | ORAL_TABLET | Freq: Two times a day (BID) | ORAL | Status: DC
  Administered 2018-04-26 – 2018-04-27 (×3): 6.25 mg via ORAL
  Filled 2018-04-26 (×3): qty 1

## 2018-04-26 MED ORDER — ACETAMINOPHEN 325 MG PO TABS: 650.0000 mg | ORAL_TABLET | Freq: Four times a day (QID) | ORAL | Status: DC | PRN

## 2018-04-26 MED ORDER — LACTATED RINGERS IV SOLN
INTRAVENOUS | Status: AC
  Administered 2018-04-26 – 2018-04-27 (×3): via INTRAVENOUS

## 2018-04-26 NOTE — H&P (Signed)
History and Physical    Carl [name] NAT:[id number] DOB: 05-30-1940 DOA: 04/25/2018  PCP: [name], MD  Patient coming from: Home.  Chief Complaint: Throwing up blood.  HPI: Carl [name] is a 78 y.o. male with history of CAD status post stenting in 2016 on aspirin and Plavix, testicular lymphoma received intrathecal methotrexate yesterday following which patient started having vomiting and after reaching home he started having vomiting again eventually started having hematemesis.  Felt weak but did not lose consciousness denies any chest pain or shortness of breath or abdominal pain did not notice any black stools or blood in the stools.  Does not take any NSAIDs did take some prednisone for the chemotherapy.  ED Course: In the ER patient was hemodynamically stable hemoglobin 13.4 platelets 185 creatinine 1.95.  Given the comorbidities patient admitted for further observation.  Review of Systems: As per HPI, rest all negative.   Past Medical History:  Diagnosis Date  . Arthritis   . Coronary artery disease   . HOH (hard of hearing)   . Hyperlipidemia   . Hypertension   . Ischemic cardiomyopathy   . Myocardial infarction ([place]) 2016  . Quadriceps tendon rupture    LEFT    Past Surgical History:  Procedure Laterality Date  . CARDIAC CATHETERIZATION N/A 10/15/2014   Procedure: Left Heart Cath and Coronary Angiography;  Surgeon: Charolette Forward, MD;  Location: [place] [facility];  Service: Cardiovascular;  Laterality: N/A;  . COLONOSCOPY    . IR IMAGING GUIDED PORT INSERTION  03/14/2018  . MICROLARYNGOSCOPY WITH CO2 LASER AND EXCISION OF VOCAL CORD LESION N/A 11/26/2017   Procedure: MICROLARYNGOSCOPY WITH CO2 LASER AND EXCISION OF VOCAL CORD LESION WITH JET VENTILATION;  Surgeon: Melida Quitter, MD;  Location: [place];  Service: ENT;  Laterality: N/A;  . QUADRICEPS TENDON REPAIR Left 02/20/2014   Procedure: LEFT REPAIR QUADRICEP TENDON;  Surgeon: Mauri Pole, MD;   Location: [facility];  Service: Orthopedics;  Laterality: Left;  . THROAT SURGERY  2010     reports that he has never smoked. He has never used smokeless tobacco. He reports previous alcohol use. He reports that he does not use drugs.  No Known Allergies  History reviewed. No pertinent family history.  Prior to Admission medications   Medication Sig Start Date End Date Taking? Authorizing Provider  aspirin EC 81 MG tablet Take 81 mg by mouth daily.   Yes [provider]  atorvastatin (LIPITOR) 40 MG tablet Take 1 tablet (40 mg total) by mouth daily at 6 PM. 10/19/14  Yes Charolette Forward, MD  carvedilol (COREG) 6.25 MG tablet Take 6.25 mg by mouth 2 (two) times daily with a meal.   Yes [provider]  cholecalciferol (VITAMIN D) 1000 UNITS tablet Take 1,000 Units by mouth daily.   Yes [provider]  losartan (COZAAR) 25 MG tablet Take 25 mg by mouth every evening.   Yes [provider]  Multiple Vitamin (MULTIVITAMIN WITH MINERALS) TABS tablet Take 1 tablet by mouth daily.   Yes [provider]  nitroGLYCERIN (NITROSTAT) 0.4 MG SL tablet Place 1 tablet (0.4 mg total) under the tongue every 5 (five) minutes x 3 doses as needed for chest pain. 10/19/14  Yes Charolette Forward, MD  ondansetron (ZOFRAN) 8 MG tablet Take 1 tablet (8 mg total) by mouth 2 (two) times daily as needed for refractory nausea / vomiting. Start on day 3 after cyclophosphamide chemotherapy. 04/19/18  Yes Sullivan Lone  Vivien Rossetti, MD  Polyethyl Glycol-Propyl Glycol (SYSTANE OP) Place 1 drop into both eyes at bedtime.    Yes [provider]  polyethylene glycol (MIRALAX) packet Take 17 g by mouth daily. 03/22/18  Yes Brunetta Genera, MD  predniSONE (DELTASONE) 20 MG tablet Take 3 tablets (60 mg total) by mouth daily. Take on days 1-5 of chemotherapy. 03/03/18  Yes Brunetta Genera, MD  prochlorperazine (COMPAZINE) 10 MG tablet Take 1 tablet (10 mg total) by mouth every 6 (six)  hours as needed (Nausea or vomiting). 03/03/18  Yes Brunetta Genera, MD  senna-docusate (SENNA S) 8.6-50 MG tablet Take 2 tablets by mouth at bedtime as needed for mild constipation or moderate constipation. 03/22/18  Yes Brunetta Genera, MD  white petrolatum (VASELINE) GEL Apply 1 application topically as needed for dry skin (itching).   Yes [provider]    Physical Exam: Vitals:   04/26/18 0030 04/26/18 0146 04/26/18 0248 04/26/18 0448  BP: (!) 142/89 (!) 139/101 102/73 124/83  Pulse: 62 82 81 86  Resp: 12 16 16 16   Temp:  98 F (36.7 C) 98.3 F (36.8 C) 98.4 F (36.9 C)  TempSrc:  Oral Oral Oral  SpO2: 98% 96% 96% 95%  Weight:      Height:          Constitutional: Moderately built and nourished. Vitals:   04/26/18 0030 04/26/18 0146 04/26/18 0248 04/26/18 0448  BP: (!) 142/89 (!) 139/101 102/73 124/83  Pulse: 62 82 81 86  Resp: 12 16 16 16   Temp:  98 F (36.7 C) 98.3 F (36.8 C) 98.4 F (36.9 C)  TempSrc:  Oral Oral Oral  SpO2: 98% 96% 96% 95%  Weight:      Height:       Eyes: Anicteric no pallor. ENMT: No discharge from the ears eyes nose and mouth. Neck: No mass felt.  No neck rigidity. Respiratory: No rhonchi or crepitations. Cardiovascular: S1-S2 heard. Abdomen: Soft nontender bowel sounds present. Musculoskeletal: No edema. Skin: No rash. Neurologic: Alert awake oriented to time place and person.  Moves all extremities. Psychiatric: Appears normal.   Labs on Admission: I have personally reviewed following labs and imaging studies  CBC: Recent Labs  Lab 04/19/18 0848 04/25/18 2332  WBC 10.0 11.0*  NEUTROABS 7.3 10.4*  HGB 12.7* 13.4  HCT 39.7 41.4  MCV 98.8 100.5*  PLT 123* 314   Basic Metabolic Panel: Recent Labs  Lab 04/19/18 0848 04/25/18 2332  NA 140 141  K 4.1 4.2  CL 106 109  CO2 24 25  GLUCOSE 128* 160*  BUN 10 15  CREATININE 1.18 0.95  CALCIUM 8.4* 9.0   GFR: Estimated Creatinine Clearance: 79.9 mL/min  (by C-G formula based on SCr of 0.95 mg/dL). Liver Function Tests: Recent Labs  Lab 04/19/18 0848 04/25/18 2332  AST 16 24  ALT 19 24  ALKPHOS 134* 94  BILITOT 0.6 1.0  PROT 6.5 7.3  ALBUMIN 3.3* 3.7   No results for input(s): LIPASE, AMYLASE in the last 168 hours. No results for input(s): AMMONIA in the last 168 hours. Coagulation Profile: Recent Labs  Lab 04/19/18 0848  INR 1.0   Cardiac Enzymes: No results for input(s): CKTOTAL, CKMB, CKMBINDEX, TROPONINI in the last 168 hours. BNP (last 3 results) No results for input(s): PROBNP in the last 8760 hours. HbA1C: No results for input(s): HGBA1C in the last 72 hours. CBG: No results for input(s): GLUCAP in the last 168 hours. Lipid Profile:  No results for input(s): CHOL, HDL, LDLCALC, TRIG, CHOLHDL, LDLDIRECT in the last 72 hours. Thyroid Function Tests: No results for input(s): TSH, T4TOTAL, FREET4, T3FREE, THYROIDAB in the last 72 hours. Anemia Panel: No results for input(s): VITAMINB12, FOLATE, FERRITIN, TIBC, IRON, RETICCTPCT in the last 72 hours. Urine analysis:    Component Value Date/Time   COLORURINE YELLOW 02/16/2014 [place] 02/16/2014 1155   LABSPEC 1.022 02/16/2014 1155   PHURINE 5.5 02/16/2014 1155   GLUCOSEU NEGATIVE 02/16/2014 1155   HGBUR TRACE (A) 02/16/2014 1155   BILIRUBINUR NEGATIVE 02/16/2014 1155   KETONESUR NEGATIVE 02/16/2014 1155   PROTEINUR NEGATIVE 02/16/2014 1155   UROBILINOGEN 1.0 02/16/2014 1155   NITRITE NEGATIVE 02/16/2014 1155   LEUKOCYTESUR NEGATIVE 02/16/2014 1155   Sepsis Labs: @LABRCNTIP (procalcitonin:4,lacticidven:4) )No results found for this or any previous visit (from the past 240 hour(s)).   Radiological Exams on Admission: Dg Chest 2 View  Result Date: 04/25/2018 CLINICAL DATA:  Vomiting EXAM: CHEST - 2 VIEW COMPARISON:  10/15/2014 FINDINGS: Right Port-A-Cath in place with the tip in the SVC. Heart is upper limits normal in size. No confluent airspace  opacities or effusions. No acute bony abnormality. IMPRESSION: No active cardiopulmonary disease. Electronically Signed   By: Rolm Baptise M.D.   On: 04/25/2018 23:59   Dg Fl Guided Theraputic Lumbar Puncture  Result Date: 04/25/2018 CLINICAL DATA:  Testicular B-cell lymphoma. Prophylactic intrathecal chemotherapy. EXAM: FLUOROSCOPICALLY GUIDED LUMBAR PUNCTURE FOR INTRATHECAL CHEMOTHERAPY FLUOROSCOPY TIME:  1 minutes 6 seconds PROCEDURE: Informed consent was obtained from the patient prior to the procedure, including potential complications of headache, allergy, and pain. With the patient prone, the lower back was prepped with Betadine. 1% Lidocaine was used for local anesthesia. Lumbar puncture was performed at the L5-S1 level using a gauge needle with return of L5-S1CSF. 15 mg of methotrexate was injected into the subarachnoid space. The patient tolerated the procedure well without apparent complication. IMPRESSION: Successful intrathecal administration of chemotherapy. First treatment. Electronically Signed   By: Suzy Bouchard M.D.   On: 04/25/2018 12:00    EKG: Independently reviewed.  Normal sinus rhythm.  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 Acute GI bleeding -patient states he had 1 large episode of hematemesis.  Has not had any previous episodes of GI bleed.  Will monitor CBC for now hold antiplatelet agents.  On Protonix.  May consult GI in the morning.  Has taken prednisone recently. 2. Testicular lymphoma being followed by oncologist.  Had received intrathecal methotrexate yesterday. 3. CAD status post CABG will hold antiplatelet agents due to GI bleed.  Continue Coreg but hold Cozaar for now.  Continue statins.   DVT prophylaxis: SCDs. Code Status: Full code. Family Communication: Discussed with patient. Disposition Plan: Home.  Consults called: None. Admission status: Observation.   Rise Patience MD [facility] Pager [phone].  If 7PM-7AM, please contact night-coverage [email or web address] Password [facility]  04/26/2018, 5:13 AM

## 2018-04-26 NOTE — H&P (View-Only) (Signed)
[facility] Consult  Referring Provider: [facility]/Dr.Matthews Primary Care Physician:  [name], MD Primary Gastroenterologist: [place]  Reason for Consultation: Hematemesis  HPI: Carl [name] is a 78 y.o. male was in his usual state of health until yesterday when he developed nausea with several episodes of vomiting and one episode of vomiting blood which he describes as significant amount of cranberry fluid. 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  He was supposed to be NPO the procedure but had cereal in a.m. and felt nauseous and dizzy post intrathecal chemotherapy.  At around 9 PM when he was back home he had an episode of vomiting of blood and called an ambulance to take him to the hospital. He has not had a bowel movement for the last 3 days.  Denies history of black stools or bloody bowel movements. He is on aspirin and has been on prednisone as part of his chemotherapy, denies use of NSAIDs. Patient has had history of hoarseness, which was attributed to acid reflux, was on Prilosec for a prolonged period of time without relief of symptoms. He otherwise denies difficulty swallowing, pain on swallowing, abdominal pain.   Past Medical History:  Diagnosis Date  . Arthritis   . Coronary artery disease   . HOH (hard of hearing)   . Hyperlipidemia   . Hypertension   . Ischemic cardiomyopathy   . Myocardial infarction ([place]) 2016  . Quadriceps tendon rupture    LEFT    Past Surgical History:  Procedure Laterality Date  . CARDIAC CATHETERIZATION N/A 10/15/2014   Procedure: Left Heart Cath and Coronary Angiography;  Surgeon: Charolette Forward, MD;  Location: [place] [facility];  Service: Cardiovascular;  Laterality: N/A;  . COLONOSCOPY    . IR IMAGING GUIDED PORT INSERTION  03/14/2018  . MICROLARYNGOSCOPY WITH CO2 LASER AND EXCISION OF VOCAL CORD LESION N/A 11/26/2017   Procedure:  MICROLARYNGOSCOPY WITH CO2 LASER AND EXCISION OF VOCAL CORD LESION WITH JET VENTILATION;  Surgeon: Melida Quitter, MD;  Location: [place];  Service: ENT;  Laterality: N/A;  . QUADRICEPS TENDON REPAIR Left 02/20/2014   Procedure: LEFT REPAIR QUADRICEP TENDON;  Surgeon: Mauri Pole, MD;  Location: [facility];  Service: Orthopedics;  Laterality: Left;  . THROAT SURGERY  2010    Prior to Admission medications   Medication Sig Start Date End Date Taking? Authorizing Provider  aspirin EC 81 MG tablet Take 81 mg by mouth daily.   Yes [provider]  atorvastatin (LIPITOR) 40 MG tablet Take 1 tablet (40 mg total) by mouth daily at 6 PM. 10/19/14  Yes Charolette Forward, MD  carvedilol (COREG) 6.25 MG tablet Take 6.25 mg by mouth 2 (two) times daily with a meal.   Yes [provider]  cholecalciferol (VITAMIN D) 1000 UNITS tablet Take 1,000 Units by mouth daily.   Yes [provider]  losartan (COZAAR) 25 MG tablet Take 25 mg by mouth every evening.   Yes [provider]  Multiple Vitamin (MULTIVITAMIN WITH MINERALS) TABS tablet Take 1 tablet by mouth daily.   Yes [provider]  nitroGLYCERIN (NITROSTAT) 0.4 MG SL tablet Place 1 tablet (0.4 mg total) under the tongue every 5 (five) minutes x 3 doses as needed for chest pain. 10/19/14  Yes Charolette Forward, MD  ondansetron (ZOFRAN) 8 MG tablet Take 1 tablet (8 mg total) by mouth 2 (two) times daily as needed for refractory nausea / vomiting.  Start on day 3 after cyclophosphamide chemotherapy. 04/19/18  Yes Brunetta Genera, MD  Polyethyl Glycol-Propyl Glycol (SYSTANE OP) Place 1 drop into both eyes at bedtime.    Yes [provider]  polyethylene glycol (MIRALAX) packet Take 17 g by mouth daily. 03/22/18  Yes Brunetta Genera, MD  predniSONE (DELTASONE) 20 MG tablet Take 3 tablets (60 mg total) by mouth daily. Take on days 1-5 of chemotherapy. 03/03/18  Yes Brunetta Genera, MD  prochlorperazine (COMPAZINE)  10 MG tablet Take 1 tablet (10 mg total) by mouth every 6 (six) hours as needed (Nausea or vomiting). 03/03/18  Yes Brunetta Genera, MD  senna-docusate (SENNA S) 8.6-50 MG tablet Take 2 tablets by mouth at bedtime as needed for mild constipation or moderate constipation. 03/22/18  Yes Brunetta Genera, MD  white petrolatum (VASELINE) GEL Apply 1 application topically as needed for dry skin (itching).   Yes [provider]    Current Facility-Administered Medications  Medication Dose Route Frequency Provider Last Rate Last Dose  . acetaminophen (TYLENOL) tablet 650 mg  650 mg Oral Q6H PRN Rise Patience, MD       Or  . acetaminophen (TYLENOL) suppository 650 mg  650 mg Rectal Q6H PRN Rise Patience, MD      . atorvastatin (LIPITOR) tablet 40 mg  40 mg Oral q1800 Rise Patience, MD      . carvedilol (COREG) tablet 6.25 mg  6.25 mg Oral BID WC Rise Patience, MD   6.25 mg at 04/26/18 0853  . lactated ringers infusion   Intravenous Continuous Rise Patience, MD 75 mL/hr at 04/26/18 0540    . ondansetron (ZOFRAN) tablet 4 mg  4 mg Oral Q6H PRN Rise Patience, MD       Or  . ondansetron [place] Day) injection 4 mg  4 mg Intravenous Q6H PRN Rise Patience, MD      . pantoprazole (PROTONIX) 80 mg in sodium chloride 0.9 % 250 mL (0.32 mg/mL) infusion  8 mg/hr Intravenous Continuous Rise Patience, MD 25 mL/hr at 04/26/18 0626 8 mg/hr at 04/26/18 0626  . [START ON 04/29/2018] pantoprazole (PROTONIX) injection 40 mg  40 mg Intravenous Q12H Rise Patience, MD        Allergies as of 04/25/2018  . (No Known Allergies)    History reviewed. No pertinent family history.  Social History   Socioeconomic History  . Marital status: Divorced    Spouse name: Not on file  . Number of children: Not on file  . Years of education: Not on file  . Highest education level: Not on file  Occupational History  . Not on file  Social Needs  .  Financial resource strain: Not on file  . Food insecurity:    Worry: Not on file    Inability: Not on file  . Transportation needs:    Medical: Not on file    Non-medical: Not on file  Tobacco Use  . Smoking status: Never Smoker  . Smokeless tobacco: Never Used  Substance and Sexual Activity  . Alcohol use: Not Currently  . Drug use: No  . Sexual activity: Not on file  Lifestyle  . Physical activity:    Days per week: Not on file    Minutes per session: Not on file  . Stress: Not on file  Relationships  . Social connections:    Talks on phone: Not on file    Gets together: Not  on file    Attends religious service: Not on file    Active member of club or organization: Not on file    Attends meetings of clubs or organizations: Not on file    Relationship status: Not on file  . Intimate partner violence:    Fear of current or ex partner: Not on file    Emotionally abused: Not on file    Physically abused: Not on file    Forced sexual activity: Not on file  Other Topics Concern  . Not on file  Social History Narrative  . Not on file    Review of Systems:  GI: Described in detail in HPI.    Gen: Denies any fever, chills, rigors, night sweats, anorexia, fatigue, weakness, malaise, involuntary weight loss, and sleep disorder CV: Denies chest pain, angina, palpitations, syncope, orthopnea, PND, peripheral edema, and claudication. Resp: Denies dyspnea, cough, sputum, wheezing, coughing up blood. GU : Denies urinary burning, blood in urine, urinary frequency, urinary hesitancy, nocturnal urination, and urinary incontinence. MS: Denies joint pain or swelling.  Denies muscle weakness, cramps, atrophy.  Derm: Denies rash, itching, oral ulcerations, hives, unhealing ulcers.  Psych: Denies depression, anxiety, memory loss, suicidal ideation, hallucinations,  and confusion. Heme: Denies bruising and enlarged lymph nodes. Neuro:  Denies any headaches, dizziness, paresthesias. Endo:   Denies any problems with DM, thyroid, adrenal function.  Physical Exam: Vital signs in last 24 hours: Temp:  [97.7 F (36.5 C)-98.7 F (37.1 C)] 98.4 F (36.9 C) (03/17 0448) Pulse Rate:  [15-86] 86 (03/17 0448) Resp:  [11-17] 16 (03/17 0448) BP: (102-150)/(72-101) 124/83 (03/17 0448) SpO2:  [95 %-99 %] 95 % (03/17 0448) Weight:  [154 kg] 108 kg (03/16 2218) Last BM Date: 04/24/18  General:   Alert,  Well-developed, well-nourished, pleasant and cooperative in NAD Head:  Normocephalic and atraumatic. Eyes:  Sclera clear, no icterus.   Conjunctiva pink. Ears:  Normal auditory acuity. Nose:  No deformity, discharge,  or lesions. Mouth:  No deformity or lesions.  Oropharynx pink & moist. Neck:  Supple; no masses or thyromegaly. Lungs:  Clear throughout to auscultation.   No wheezes, crackles, or rhonchi. No acute distress. Heart:  Regular rate and rhythm; no murmurs, clicks, rubs,  or gallops. Extremities:  Without clubbing or edema. Neurologic:  Alert and  oriented x4;  grossly normal neurologically. Skin:  Intact without significant lesions or rashes. Psych:  Alert and cooperative. Normal mood and affect. Abdomen:  Soft, nontender and nondistended. No masses, hepatosplenomegaly or hernias noted. Normal bowel sounds, without guarding, and without rebound.         Lab Results: Recent Labs    04/25/18 2332 04/26/18 0533  WBC 11.0* 18.3*  HGB 13.4 11.5*  HCT 41.4 36.8*  PLT 185 184   BMET Recent Labs    04/25/18 2332 04/26/18 0533  NA 141 142  K 4.2 4.4  CL 109 112*  CO2 25 23  GLUCOSE 160* 147*  BUN 15 20  CREATININE 0.95 0.87  CALCIUM 9.0 8.5*   LFT Recent Labs    04/26/18 0533  PROT 5.7*  ALBUMIN 3.0*  AST 18  ALT 19  ALKPHOS 74  BILITOT 0.9  BILIDIR 0.2  IBILI 0.7   PT/INR No results for input(s): LABPROT, INR in the last 72 hours.  Studies/Results: Dg Chest 2 View  Result Date: 04/25/2018 CLINICAL DATA:  Vomiting EXAM: CHEST - 2 VIEW  COMPARISON:  10/15/2014 FINDINGS: Right Port-A-Cath in place with the tip in  the SVC. Heart is upper limits normal in size. No confluent airspace opacities or effusions. No acute bony abnormality. IMPRESSION: No active cardiopulmonary disease. Electronically Signed   By: Rolm Baptise M.D.   On: 04/25/2018 23:59   Dg Fl Guided Theraputic Lumbar Puncture  Result Date: 04/25/2018 CLINICAL DATA:  Testicular B-cell lymphoma. Prophylactic intrathecal chemotherapy. EXAM: FLUOROSCOPICALLY GUIDED LUMBAR PUNCTURE FOR INTRATHECAL CHEMOTHERAPY FLUOROSCOPY TIME:  1 minutes 6 seconds PROCEDURE: Informed consent was obtained from the patient prior to the procedure, including potential complications of headache, allergy, and pain. With the patient prone, the lower back was prepped with Betadine. 1% Lidocaine was used for local anesthesia. Lumbar puncture was performed at the L5-S1 level using a gauge needle with return of L5-S1CSF. 15 mg of methotrexate was injected into the subarachnoid space. The patient tolerated the procedure well without apparent complication. IMPRESSION: Successful intrathecal administration of chemotherapy. First treatment. Electronically Signed   By: Suzy Bouchard M.D.   On: 04/25/2018 12:00    Impression: Hematemesis following several episodes of nausea and vomiting Hemoglobin slightly dropped from 12.7-13.4-11.5 Macrocytosis, MCV 103.1, normal platelet Elevated BUN/creatinine ratio of 20/0.87  History of primary testicular diffuse large B-cell lymphoma, on chemotherapy  Plan: Clear liquid diet today N.p.o. past midnight Continue IV Protonix drip EGD in a.m. The risks and the benefits of the procedure were discussed with the patient in details.  Understands and verbalizes consent.    LOS: 0 days   Ronnette Juniper, MD  04/26/2018, 11:38 AM  Pager [phone] If no answer or after 5 PM call [phone]

## 2018-04-26 NOTE — Consult Note (Signed)
[facility] Consult  Referring Provider: [facility]/Dr.Matthews Primary Care Physician:  [name], MD Primary Gastroenterologist: [place]  Reason for Consultation: Hematemesis  HPI: Carl [name] is a 78 y.o. male was in his usual state of health until yesterday when he developed nausea with several episodes of vomiting and one episode of vomiting blood which he describes as significant amount of cranberry fluid. 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  He was supposed to be NPO the procedure but had cereal in a.m. and felt nauseous and dizzy post intrathecal chemotherapy.  At around 9 PM when he was back home he had an episode of vomiting of blood and called an ambulance to take him to the hospital. He has not had a bowel movement for the last 3 days.  Denies history of black stools or bloody bowel movements. He is on aspirin and has been on prednisone as part of his chemotherapy, denies use of NSAIDs. Patient has had history of hoarseness, which was attributed to acid reflux, was on Prilosec for a prolonged period of time without relief of symptoms. He otherwise denies difficulty swallowing, pain on swallowing, abdominal pain.   Past Medical History:  Diagnosis Date  . Arthritis   . Coronary artery disease   . HOH (hard of hearing)   . Hyperlipidemia   . Hypertension   . Ischemic cardiomyopathy   . Myocardial infarction ([place]) 2016  . Quadriceps tendon rupture    LEFT    Past Surgical History:  Procedure Laterality Date  . CARDIAC CATHETERIZATION N/A 10/15/2014   Procedure: Left Heart Cath and Coronary Angiography;  Surgeon: Charolette Forward, MD;  Location: [facility];  Service: Cardiovascular;  Laterality: N/A;  . COLONOSCOPY    . IR IMAGING GUIDED PORT INSERTION  03/14/2018  . MICROLARYNGOSCOPY WITH CO2 LASER AND EXCISION OF VOCAL CORD LESION N/A 11/26/2017   Procedure:  MICROLARYNGOSCOPY WITH CO2 LASER AND EXCISION OF VOCAL CORD LESION WITH JET VENTILATION;  Surgeon: Melida Quitter, MD;  Location: [facility];  Service: ENT;  Laterality: N/A;  . QUADRICEPS TENDON REPAIR Left 02/20/2014   Procedure: LEFT REPAIR QUADRICEP TENDON;  Surgeon: Mauri Pole, MD;  Location: [facility];  Service: Orthopedics;  Laterality: Left;  . THROAT SURGERY  2010    Prior to Admission medications   Medication Sig Start Date End Date Taking? Authorizing Provider  aspirin EC 81 MG tablet Take 81 mg by mouth daily.   Yes [provider]  atorvastatin (LIPITOR) 40 MG tablet Take 1 tablet (40 mg total) by mouth daily at 6 PM. 10/19/14  Yes Charolette Forward, MD  carvedilol (COREG) 6.25 MG tablet Take 6.25 mg by mouth 2 (two) times daily with a meal.   Yes [provider]  cholecalciferol (VITAMIN D) 1000 UNITS tablet Take 1,000 Units by mouth daily.   Yes [provider]  losartan (COZAAR) 25 MG tablet Take 25 mg by mouth every evening.   Yes [provider]  Multiple Vitamin (MULTIVITAMIN WITH MINERALS) TABS tablet Take 1 tablet by mouth daily.   Yes [provider]  nitroGLYCERIN (NITROSTAT) 0.4 MG SL tablet Place 1 tablet (0.4 mg total) under the tongue every 5 (five) minutes x 3 doses as needed for chest pain. 10/19/14  Yes Charolette Forward, MD  ondansetron (ZOFRAN) 8 MG tablet Take 1 tablet (8 mg total) by mouth 2 (two) times daily as needed for refractory nausea / vomiting.  Start on day 3 after cyclophosphamide chemotherapy. 04/19/18  Yes Brunetta Genera, MD  Polyethyl Glycol-Propyl Glycol (SYSTANE OP) Place 1 drop into both eyes at bedtime.    Yes [provider]  polyethylene glycol (MIRALAX) packet Take 17 g by mouth daily. 03/22/18  Yes Brunetta Genera, MD  predniSONE (DELTASONE) 20 MG tablet Take 3 tablets (60 mg total) by mouth daily. Take on days 1-5 of chemotherapy. 03/03/18  Yes Brunetta Genera, MD  prochlorperazine (COMPAZINE)  10 MG tablet Take 1 tablet (10 mg total) by mouth every 6 (six) hours as needed (Nausea or vomiting). 03/03/18  Yes Brunetta Genera, MD  senna-docusate (SENNA S) 8.6-50 MG tablet Take 2 tablets by mouth at bedtime as needed for mild constipation or moderate constipation. 03/22/18  Yes Brunetta Genera, MD  white petrolatum (VASELINE) GEL Apply 1 application topically as needed for dry skin (itching).   Yes [provider]    Current Facility-Administered Medications  Medication Dose Route Frequency Provider Last Rate Last Dose  . acetaminophen (TYLENOL) tablet 650 mg  650 mg Oral Q6H PRN Rise Patience, MD       Or  . acetaminophen (TYLENOL) suppository 650 mg  650 mg Rectal Q6H PRN Rise Patience, MD      . atorvastatin (LIPITOR) tablet 40 mg  40 mg Oral q1800 Rise Patience, MD      . carvedilol (COREG) tablet 6.25 mg  6.25 mg Oral BID WC Rise Patience, MD   6.25 mg at 04/26/18 0853  . lactated ringers infusion   Intravenous Continuous Rise Patience, MD 75 mL/hr at 04/26/18 0540    . ondansetron (ZOFRAN) tablet 4 mg  4 mg Oral Q6H PRN Rise Patience, MD       Or  . ondansetron [facility]) injection 4 mg  4 mg Intravenous Q6H PRN Rise Patience, MD      . pantoprazole (PROTONIX) 80 mg in sodium chloride 0.9 % 250 mL (0.32 mg/mL) infusion  8 mg/hr Intravenous Continuous Rise Patience, MD 25 mL/hr at 04/26/18 0626 8 mg/hr at 04/26/18 0626  . [START ON 04/29/2018] pantoprazole (PROTONIX) injection 40 mg  40 mg Intravenous Q12H Rise Patience, MD        Allergies as of 04/25/2018  . (No Known Allergies)    History reviewed. No pertinent family history.  Social History   Socioeconomic History  . Marital status: Divorced    Spouse name: Not on file  . Number of children: Not on file  . Years of education: Not on file  . Highest education level: Not on file  Occupational History  . Not on file  Social Needs  .  Financial resource strain: Not on file  . Food insecurity:    Worry: Not on file    Inability: Not on file  . Transportation needs:    Medical: Not on file    Non-medical: Not on file  Tobacco Use  . Smoking status: Never Smoker  . Smokeless tobacco: Never Used  Substance and Sexual Activity  . Alcohol use: Not Currently  . Drug use: No  . Sexual activity: Not on file  Lifestyle  . Physical activity:    Days per week: Not on file    Minutes per session: Not on file  . Stress: Not on file  Relationships  . Social connections:    Talks on phone: Not on file    Gets together: Not  on file    Attends religious service: Not on file    Active member of club or organization: Not on file    Attends meetings of clubs or organizations: Not on file    Relationship status: Not on file  . Intimate partner violence:    Fear of current or ex partner: Not on file    Emotionally abused: Not on file    Physically abused: Not on file    Forced sexual activity: Not on file  Other Topics Concern  . Not on file  Social History Narrative  . Not on file    Review of Systems:  GI: Described in detail in HPI.    Gen: Denies any fever, chills, rigors, night sweats, anorexia, fatigue, weakness, malaise, involuntary weight loss, and sleep disorder CV: Denies chest pain, angina, palpitations, syncope, orthopnea, PND, peripheral edema, and claudication. Resp: Denies dyspnea, cough, sputum, wheezing, coughing up blood. GU : Denies urinary burning, blood in urine, urinary frequency, urinary hesitancy, nocturnal urination, and urinary incontinence. MS: Denies joint pain or swelling.  Denies muscle weakness, cramps, atrophy.  Derm: Denies rash, itching, oral ulcerations, hives, unhealing ulcers.  Psych: Denies depression, anxiety, memory loss, suicidal ideation, hallucinations,  and confusion. Heme: Denies bruising and enlarged lymph nodes. Neuro:  Denies any headaches, dizziness, paresthesias. Endo:   Denies any problems with DM, thyroid, adrenal function.  Physical Exam: Vital signs in last 24 hours: Temp:  [97.7 F (36.5 C)-98.7 F (37.1 C)] 98.4 F (36.9 C) (03/17 0448) Pulse Rate:  [15-86] 86 (03/17 0448) Resp:  [11-17] 16 (03/17 0448) BP: (102-150)/(72-101) 124/83 (03/17 0448) SpO2:  [95 %-99 %] 95 % (03/17 0448) Weight:  [144 kg] 108 kg (03/16 2218) Last BM Date: 04/24/18  General:   Alert,  Well-developed, well-nourished, pleasant and cooperative in NAD Head:  Normocephalic and atraumatic. Eyes:  Sclera clear, no icterus.   Conjunctiva pink. Ears:  Normal auditory acuity. Nose:  No deformity, discharge,  or lesions. Mouth:  No deformity or lesions.  Oropharynx pink & moist. Neck:  Supple; no masses or thyromegaly. Lungs:  Clear throughout to auscultation.   No wheezes, crackles, or rhonchi. No acute distress. Heart:  Regular rate and rhythm; no murmurs, clicks, rubs,  or gallops. Extremities:  Without clubbing or edema. Neurologic:  Alert and  oriented x4;  grossly normal neurologically. Skin:  Intact without significant lesions or rashes. Psych:  Alert and cooperative. Normal mood and affect. Abdomen:  Soft, nontender and nondistended. No masses, hepatosplenomegaly or hernias noted. Normal bowel sounds, without guarding, and without rebound.         Lab Results: Recent Labs    04/25/18 2332 04/26/18 0533  WBC 11.0* 18.3*  HGB 13.4 11.5*  HCT 41.4 36.8*  PLT 185 184   BMET Recent Labs    04/25/18 2332 04/26/18 0533  NA 141 142  K 4.2 4.4  CL 109 112*  CO2 25 23  GLUCOSE 160* 147*  BUN 15 20  CREATININE 0.95 0.87  CALCIUM 9.0 8.5*   LFT Recent Labs    04/26/18 0533  PROT 5.7*  ALBUMIN 3.0*  AST 18  ALT 19  ALKPHOS 74  BILITOT 0.9  BILIDIR 0.2  IBILI 0.7   PT/INR No results for input(s): LABPROT, INR in the last 72 hours.  Studies/Results: Dg Chest 2 View  Result Date: 04/25/2018 CLINICAL DATA:  Vomiting EXAM: CHEST - 2 VIEW  COMPARISON:  10/15/2014 FINDINGS: Right Port-A-Cath in place with the tip in  the SVC. Heart is upper limits normal in size. No confluent airspace opacities or effusions. No acute bony abnormality. IMPRESSION: No active cardiopulmonary disease. Electronically Signed   By: Rolm Baptise M.D.   On: 04/25/2018 23:59   Dg Fl Guided Theraputic Lumbar Puncture  Result Date: 04/25/2018 CLINICAL DATA:  Testicular B-cell lymphoma. Prophylactic intrathecal chemotherapy. EXAM: FLUOROSCOPICALLY GUIDED LUMBAR PUNCTURE FOR INTRATHECAL CHEMOTHERAPY FLUOROSCOPY TIME:  1 minutes 6 seconds PROCEDURE: Informed consent was obtained from the patient prior to the procedure, including potential complications of headache, allergy, and pain. With the patient prone, the lower back was prepped with Betadine. 1% Lidocaine was used for local anesthesia. Lumbar puncture was performed at the L5-S1 level using a gauge needle with return of L5-S1CSF. 15 mg of methotrexate was injected into the subarachnoid space. The patient tolerated the procedure well without apparent complication. IMPRESSION: Successful intrathecal administration of chemotherapy. First treatment. Electronically Signed   By: Suzy Bouchard M.D.   On: 04/25/2018 12:00    Impression: Hematemesis following several episodes of nausea and vomiting Hemoglobin slightly dropped from 12.7-13.4-11.5 Macrocytosis, MCV 103.1, normal platelet Elevated BUN/creatinine ratio of 20/0.87  History of primary testicular diffuse large B-cell lymphoma, on chemotherapy  Plan: Clear liquid diet today N.p.o. past midnight Continue IV Protonix drip EGD in a.m. The risks and the benefits of the procedure were discussed with the patient in details.  Understands and verbalizes consent.    LOS: 0 days   Ronnette Juniper, MD  04/26/2018, 11:38 AM  Pager [phone] If no answer or after 5 PM call [phone]

## 2018-04-26 NOTE — Progress Notes (Addendum)
HEMATOLOGY-ONCOLOGY PROGRESS NOTE  SUBJECTIVE: Patient admitted to the hospital early this morning secondary to hematemesis.  Has been aspirin recently.  No recent NSAIDs but has been on prednisone as part of his chemotherapy regimen.  Aspirin currently on hold.  Has been started on IV Protonix.  The patient tells me that he had vomiting following his intrathecal chemotherapy administration yesterday.  Thinks that the vomiting was related to the fact that he ate prior to his procedure because he was not told to be n.p.o. for it.  States that he had at least one episode of hematemesis.  He has not had any vomiting for the past few hours.  Denies abdominal pain.  He has no nausea at this time.  Has not noted any blood in his stools.  Reports that he had a mild headache following the intrathecal methotrexate that resolved on its own.    Diffuse large B-cell lymphoma of solid organ excluding spleen (HCC)   03/03/2018 Initial Diagnosis    Diffuse large B-cell lymphoma of solid organ excluding spleen (HCC)    03/15/2018 -  Chemotherapy    The patient had DOXOrubicin (ADRIAMYCIN) chemo injection 58 mg, 25 mg/m2 = 58 mg (100 % of original dose 25 mg/m2), Intravenous,  Once, 2 of 6 cycles Dose modification: 25 mg/m2 (original dose 25 mg/m2, Cycle 1, Reason: Provider Judgment) Administration: 58 mg (03/15/2018), 58 mg (04/05/2018) palonosetron (ALOXI) injection 0.25 mg, 0.25 mg, Intravenous,  Once, 2 of 6 cycles Administration: 0.25 mg (03/15/2018), 0.25 mg (04/05/2018) pegfilgrastim-cbqv (UDENYCA) injection 6 mg, 6 mg, Subcutaneous, Once, 2 of 6 cycles Administration: 6 mg (03/17/2018), 6 mg (04/07/2018) vinCRIStine (ONCOVIN) 1 mg in sodium chloride 0.9 % 50 mL chemo infusion, 1 mg (100 % of original dose 1 mg), Intravenous,  Once, 2 of 6 cycles Dose modification: 1 mg (original dose 1 mg, Cycle 1, Reason: Provider Judgment) Administration: 1 mg (03/15/2018), 1 mg (04/05/2018) methotrexate (PF) 12 mg, hydrocortisone  sodium succinate (SOLU-CORTEF) 100 mg in sodium chloride (PF) 0.9 % INTRATHECAL chemo injection, , Intrathecal,  Once, 1 of 5 cycles Administration:  (04/25/2018) riTUXimab (RITUXAN) 900 mg in sodium chloride 0.9 % 250 mL (2.6471 mg/mL) infusion, 375 mg/m2 = 900 mg, Intravenous,  Once, 2 of 6 cycles Administration: 900 mg (03/15/2018) cyclophosphamide (CYTOXAN) 940 mg in sodium chloride 0.9 % 250 mL chemo infusion, 400 mg/m2 = 940 mg (100 % of original dose 400 mg/m2), Intravenous,  Once, 2 of 6 cycles Dose modification: 400 mg/m2 (original dose 400 mg/m2, Cycle 1, Reason: Provider Judgment) Administration: 940 mg (03/15/2018), 940 mg (04/05/2018) riTUXimab (RITUXAN) 900 mg in sodium chloride 0.9 % 160 mL infusion, 375 mg/m2 = 900 mg (100 % of original dose 375 mg/m2), Intravenous,  Once, 1 of 5 cycles Dose modification: 375 mg/m2 (original dose 375 mg/m2, Cycle 2) Administration: 900 mg (04/05/2018) ondansetron (ZOFRAN) 8 mg, dexamethasone (DECADRON) 10 mg in sodium chloride 0.9 % 50 mL IVPB, , Intravenous,  Once, 1 of 5 cycles Administration:  (04/25/2018) fosaprepitant (EMEND) 150 mg, dexamethasone (DECADRON) 12 mg in sodium chloride 0.9 % 145 mL IVPB, , Intravenous,  Once, 2 of 6 cycles Administration:  (03/15/2018),  (04/05/2018)  for chemotherapy treatment.      REVIEW OF SYSTEMS:   Constitutional: Denies fevers, chills or abnormal weight loss Eyes: Denies blurriness of vision Ears, nose, mouth, throat, and face: Denies mucositis or sore throat Respiratory: Denies cough, dyspnea or wheezes Cardiovascular: Denies palpitation, chest discomfort Gastrointestinal:  Denies nausea, heartburn or change  in bowel habits.  Had vomiting following intrathecal methotrexate which has now resolved.  Reports hematemesis. Skin: Denies abnormal skin rashes Lymphatics: Denies new lymphadenopathy or easy bruising Neurological:Denies numbness, tingling or new weaknesses Behavioral/Psych: Mood is stable, no new  changes  Extremities: No lower extremity edema All other systems were reviewed with the patient and are negative.  I have reviewed the past medical history, past surgical history, social history and family history with the patient and they are unchanged from previous note.   PHYSICAL EXAMINATION:  Vitals:   04/26/18 0248 04/26/18 0448  BP: 102/73 124/83  Pulse: 81 86  Resp: 16 16  Temp: 98.3 F (36.8 C) 98.4 F (36.9 C)  SpO2: 96% 95%   Filed Weights   04/25/18 2218  Weight: 238 lb 1.6 oz (108 kg)    GENERAL:alert, no distress and comfortable SKIN: skin color, texture, turgor are normal, no rashes or significant lesions EYES: normal, Conjunctiva are pink and non-injected, sclera clear OROPHARYNX:no exudate, no erythema and lips, buccal mucosa, and tongue normal  NECK: supple, thyroid normal size, non-tender, without nodularity LYMPH:  no palpable lymphadenopathy in the cervical, axillary or inguinal LUNGS: clear to auscultation and percussion with normal breathing effort HEART: regular rate & rhythm and no murmurs and no lower extremity edema ABDOMEN:abdomen soft, non-tender and normal bowel sounds Musculoskeletal:no cyanosis of digits and no clubbing  NEURO: alert & oriented x 3 with fluent speech, no focal motor/sensory deficits  LABORATORY DATA:  I have reviewed the data as listed CMP Latest Ref Rng & Units 04/26/2018 04/25/2018 04/19/2018  Glucose 70 - 99 mg/dL 147(H) 160(H) 128(H)  BUN 8 - 23 mg/dL 20 15 10   Creatinine 0.61 - 1.24 mg/dL 0.87 0.95 1.18  Sodium 135 - 145 mmol/L 142 141 140  Potassium 3.5 - 5.1 mmol/L 4.4 4.2 4.1  Chloride 98 - 111 mmol/L 112(H) 109 106  CO2 22 - 32 mmol/L 23 25 24   Calcium 8.9 - 10.3 mg/dL 8.5(L) 9.0 8.4(L)  Total Protein 6.5 - 8.1 g/dL 5.7(L) 7.3 6.5  Total Bilirubin 0.3 - 1.2 mg/dL 0.9 1.0 0.6  Alkaline Phos 38 - 126 U/L 74 94 134(H)  AST 15 - 41 U/L 18 24 16   ALT 0 - 44 U/L 19 24 19     Lab Results  Component Value Date   WBC  18.3 (H) 04/26/2018   HGB 11.5 (L) 04/26/2018   HCT 36.8 (L) 04/26/2018   MCV 103.1 (H) 04/26/2018   PLT 184 04/26/2018   NEUTROABS 10.4 (H) 04/25/2018    ASSESSMENT: 78 y.o. male with  1. Primary Testicular Diffuse Large B-Cell Lymphoma, Stage 1E 11/30/16 NM Myocar Multi w/spect w/wall motion which revealed a LV EF of 44%  12/08/17 US Scrotum revealed Abnormal appearance of the left testicle which is larger than the right and diffusely heterogeneous in echotexture. Cannot exclude infiltrating process/mass. Appearance is concerning for possible neoplasm.  01/05/18 Left testes biopsy revealed Primary Testicular Diffuse Large B-Cell Lymphoma   01/25/18 Hep B, Hep C and HIV negative  01/31/18 ECHO revealed a LV EF of 51%. Left ventricle: The cavity size was normal. Wall motion was normal; there were no regional wall motion abnormalities. Atrial septum: No defect or patent foramen ovale was identified.  02/07/18 PET/CT revealed No findings for metabolically active lymphoma involving the neck, chest, abdomen/pelvis or osseous structures.  PLAN -Has completed 2 cycles of R-mini-CHOP with G-CSF support.  Patient was due for cycle 3 today.  We will hold his  chemotherapy that was scheduled for today due to hematemesis.   -The patient received IT MTX on 04/25/18.  Developed vomiting and a mild headache following his first treatment.  Plan is for a maximum of 4 IT MTX injections, and will adjust this plan according to tolerance as necessary.  -Agree with GI consult for evaluation of hematemesis. -Labs reviewed.  Has mild leukocytosis likely related to G-CSF.  He remains afebrile and has no signs of infection.  Platelets remain normal.  Patient has mild anemia with a hemoglobin 11.5 likely due to recent episode of hematemesis.  Continue to monitor his counts and consider a blood transfusion if his hemoglobin is less than 7.5 or active bleeding.  Mikey Bussing, DNP, AGPCNP-BC,  AOCNP   ADDENDUM  .Patient was Personally and independently interviewed, examined and relevant elements of the history of present illness were reviewed in details and an assessment and plan was created. All elements of the patient's history of present illness , assessment and plan were discussed in details with Mikey Bussing DNP. The above documentation reflects our combined findings assessment and plan.  Have discussed with patient rescheduling of his planned chemotherapy C3 in 7-10 days as outpatient. Will reconsider whether or not to continue further prophylactic IT MTX. Awaiting EGD results. Appreciate GI input.  Sullivan Lone MD MS

## 2018-04-26 NOTE — ED Notes (Signed)
ED TO INPATIENT HANDOFF REPORT  ED Nurse Name and Phone #: Christinia Gully Name/Age/Gender Carl [name] 78 y.o. male Room/Bed: WA04/WA04  Code Status   Code Status: Prior  Home/SNF/Other given to floor Patient oriented to: self, place, time and situation Is this baseline? Yes   Triage Complete: Triage complete  Chief Complaint Oncology Pt. Actively on Chemo/GI Bleed  Triage Note Patient is from home and transported via [facility]. Patient has a history of primary testicular lymphoma and received chemotherapy today. During chemotherapy, patient started experiencing nausea. Symptom progressed to vomiting, bright red blood. EMS obtained two IV's and administered Zofran 4mg  IV.     Allergies No Known Allergies  Level of Care/Admitting Diagnosis ED Disposition    ED Disposition Condition Comment   Admit  Hospital Area: [place] [[id number]]  Level of Care: Telemetry [5]  Admit to tele based on following criteria: Monitor for Ischemic changes  Diagnosis: Acute GI bleeding [324401]  Admitting Physician: Rise Patience [phone]  Attending Physician: Rise Patience [[id number]]  PT Class (Do Not Modify): Observation [104]  PT Acc Code (Do Not Modify): Observation [10022]       B Medical/Surgery History Past Medical History:  Diagnosis Date  . Arthritis   . Coronary artery disease   . HOH (hard of hearing)   . Hyperlipidemia   . Hypertension   . Ischemic cardiomyopathy   . Myocardial infarction ([place]) 2016  . Quadriceps tendon rupture    LEFT   Past Surgical History:  Procedure Laterality Date  . CARDIAC CATHETERIZATION N/A 10/15/2014   Procedure: Left Heart Cath and Coronary Angiography;  Surgeon: Charolette Forward, MD;  Location: [facility];  Service: Cardiovascular;  Laterality: N/A;  . COLONOSCOPY    . IR IMAGING GUIDED PORT INSERTION  03/14/2018  . MICROLARYNGOSCOPY WITH CO2 LASER AND EXCISION OF VOCAL CORD LESION N/A  11/26/2017   Procedure: MICROLARYNGOSCOPY WITH CO2 LASER AND EXCISION OF VOCAL CORD LESION WITH JET VENTILATION;  Surgeon: Melida Quitter, MD;  Location: [facility];  Service: ENT;  Laterality: N/A;  . QUADRICEPS TENDON REPAIR Left 02/20/2014   Procedure: LEFT REPAIR QUADRICEP TENDON;  Surgeon: Mauri Pole, MD;  Location: [facility];  Service: Orthopedics;  Laterality: Left;  . THROAT SURGERY  2010     A IV Location/Drains/Wounds Patient Lines/Drains/Airways Status   Active Line/Drains/Airways    Name:   Placement date:   Placement time:   Site:   Days:   Peripheral IV 04/25/18 Left Antecubital   04/25/18    2209    Antecubital   1   Peripheral IV 04/25/18 Right Forearm   04/25/18    2210    Forearm   1   AIRWAYS   02/20/14    1629     1526   Incision (Closed) 02/20/14 Knee Left   02/20/14    1719     1526   Incision (Closed) 11/26/17 Throat   11/26/17    1237     151          Intake/Output Last 24 hours No intake or output data in the 24 hours ending 04/26/18 0221  Labs/Imaging Results for orders placed or performed during the hospital encounter of 04/25/18 (from the past 48 hour(s))  Comprehensive metabolic panel     Status: Abnormal   Collection Time: 04/25/18 11:32 PM  Result Value Ref Range   Sodium 141 135 - 145 mmol/L   Potassium  4.2 3.5 - 5.1 mmol/L   Chloride 109 98 - 111 mmol/L   CO2 25 22 - 32 mmol/L   Glucose, Bld 160 (H) 70 - 99 mg/dL   BUN 15 8 - 23 mg/dL   Creatinine, Ser 0.95 0.61 - 1.24 mg/dL   Calcium 9.0 8.9 - 10.3 mg/dL   Total Protein 7.3 6.5 - 8.1 g/dL   Albumin 3.7 3.5 - 5.0 g/dL   AST 24 15 - 41 U/L   ALT 24 0 - 44 U/L   Alkaline Phosphatase 94 38 - 126 U/L   Total Bilirubin 1.0 0.3 - 1.2 mg/dL   GFR calc non Af Amer >60 >60 mL/min   GFR calc Af Amer >60 >60 mL/min   Anion gap 7 5 - 15    Comment: Performed at [facility], [place] [address] [place], [place] [address]  CBC with Differential     Status: Abnormal   Collection Time:  04/25/18 11:32 PM  Result Value Ref Range   WBC 11.0 (H) 4.0 - 10.5 K/uL   RBC 4.12 (L) 4.22 - 5.81 MIL/uL   Hemoglobin 13.4 13.0 - 17.0 g/dL   HCT 41.4 39.0 - 52.0 %   MCV 100.5 (H) 80.0 - 100.0 fL   MCH 32.5 26.0 - 34.0 pg   MCHC 32.4 30.0 - 36.0 g/dL   RDW 13.3 11.5 - 15.5 %   Platelets 185 150 - 400 K/uL   nRBC 0.0 0.0 - 0.2 %   Neutrophils Relative % 94 %   Neutro Abs 10.4 (H) 1.7 - 7.7 K/uL   Lymphocytes Relative 4 %   Lymphs Abs 0.5 (L) 0.7 - 4.0 K/uL   Monocytes Relative 1 %   Monocytes Absolute 0.1 0.1 - 1.0 K/uL   Eosinophils Relative 0 %   Eosinophils Absolute 0.0 0.0 - 0.5 K/uL   Basophils Relative 0 %   Basophils Absolute 0.0 0.0 - 0.1 K/uL   Immature Granulocytes 1 %   Abs Immature Granulocytes 0.06 0.00 - 0.07 K/uL    Comment: Performed at [facility], [place] [address] [place], [place] [address]   Dg Chest 2 View  Result Date: 04/25/2018 CLINICAL DATA:  Vomiting EXAM: CHEST - 2 VIEW COMPARISON:  10/15/2014 FINDINGS: Right Port-A-Cath in place with the tip in the SVC. Heart is upper limits normal in size. No confluent airspace opacities or effusions. No acute bony abnormality. IMPRESSION: No active cardiopulmonary disease. Electronically Signed   By: Rolm Baptise M.D.   On: 04/25/2018 23:59   Dg Fl Guided Theraputic Lumbar Puncture  Result Date: 04/25/2018 CLINICAL DATA:  Testicular B-cell lymphoma. Prophylactic intrathecal chemotherapy. EXAM: FLUOROSCOPICALLY GUIDED LUMBAR PUNCTURE FOR INTRATHECAL CHEMOTHERAPY FLUOROSCOPY TIME:  1 minutes 6 seconds PROCEDURE: Informed consent was obtained from the patient prior to the procedure, including potential complications of headache, allergy, and pain. With the patient prone, the lower back was prepped with Betadine. 1% Lidocaine was used for local anesthesia. Lumbar puncture was performed at the L5-S1 level using a gauge needle with return of L5-S1CSF. 15 mg of methotrexate was injected into the subarachnoid  space. The patient tolerated the procedure well without apparent complication. IMPRESSION: Successful intrathecal administration of chemotherapy. First treatment. Electronically Signed   By: Suzy Bouchard M.D.   On: 04/25/2018 12:00    Pending Labs Unresulted Labs (From admission, onward)   None      Vitals/Pain Today's Vitals   04/25/18 2300 04/26/18 0016 04/26/18 0030 04/26/18 0146  BP: (!) 133/91 (!) 150/101 (!) 142/89 (!) 139/101  Pulse: 77 73 62 82  Resp: 11 14 12 16   Temp:  97.8 F (36.6 C)  98 F (36.7 C)  TempSrc:  Oral  Oral  SpO2: 98% 95% 98% 96%  Weight:      Height:      PainSc:        Isolation Precautions No active isolations  Medications Medications  sodium chloride 0.9 % bolus 500 mL (0 mLs Intravenous Stopped 04/26/18 0055)  ondansetron (ZOFRAN) injection 4 mg (4 mg Intravenous Given 04/25/18 2341)    Mobility walks with person assist Moderate fall risk   Focused Assessments Cardiac Assessment Handoff:  Cardiac Rhythm: Normal sinus rhythm Lab Results  Component Value Date   TROPONINI 31.84 ([place]) 10/19/2014   No results found for: DDIMER Does the Patient currently have chest pain? No     R Recommendations: See Admitting Provider Note  Report given to:   Additional Notes:

## 2018-04-26 NOTE — Anesthesia Preprocedure Evaluation (Addendum)
Anesthesia Evaluation  Patient identified by MRN, date of birth, ID band Patient awake    Reviewed: Allergy & Precautions, NPO status , Patient's Chart, lab work & pertinent test results  Airway Mallampati: II  TM Distance: >3 FB Neck ROM: Full    Dental no notable dental hx. (+) Teeth Intact, Dental Advisory Given   Pulmonary neg pulmonary ROS,    Pulmonary exam normal breath sounds clear to auscultation       Cardiovascular Exercise Tolerance: Good hypertension, Pt. on medications and Pt. on home beta blockers + CAD and + Past MI  Normal cardiovascular exam Rhythm:Regular Rate:Normal     Neuro/Psych  Neuromuscular disease negative psych ROS   GI/Hepatic negative GI ROS, Neg liver ROS,   Endo/Other  negative endocrine ROS  Renal/GU K+ 3.7 Cr 1.04   Hx of testicular Lymphoma on Methotrexate    Musculoskeletal  (+) Arthritis ,   Abdominal   Peds  Hematology  (+) anemia , Hx of B cell Lymphoma  Hgb 9.2   Anesthesia Other Findings   Reproductive/Obstetrics                            Anesthesia Physical Anesthesia Plan  ASA: III  Anesthesia Plan: MAC   Post-op Pain Management:    Induction: Intravenous  PONV Risk Score and Plan: Treatment may vary due to age or medical condition  Airway Management Planned: Nasal Cannula and Natural Airway  Additional Equipment:   Intra-op Plan:   Post-operative Plan:   Informed Consent: I have reviewed the patients History and Physical, chart, labs and discussed the procedure including the risks, benefits and alternatives for the proposed anesthesia with the patient or authorized representative who has indicated his/her understanding and acceptance.     Dental advisory given  Plan Discussed with:   Anesthesia Plan Comments: (Pt w hx of Hemoptysis for EGD)       Anesthesia Quick Evaluation

## 2018-04-26 NOTE — Plan of Care (Signed)
78 year old male admitted this morning with hematemesis.  Patient has a history of CAD and stent on aspirin and Plavix since 2016.  Patient also has history of testicular lymphoma.  Receives intrathecal methotrexate the cancer center.  He had his shot yesterday went home after reaching home he started having hematemesis.  He is on prednisone for chemotherapy   This morning he denies any further vomiting has not had a bowel movement.  His hemoglobin is 11.5 from 13.4 also noted he is on IV fluids.  Will consult lab our GI.  Continue Protonix.

## 2018-04-26 NOTE — ED Notes (Signed)
Gave report to L[place]ita, Therapist, sports.

## 2018-04-26 NOTE — Progress Notes (Signed)
Talked with Dr. Grier Mitts RN at office about patient's scheduled outpatient chemo for today.  Per RN pt will probably not be able to receive the chemo while still in the hospital.  Paged Dr. Rodena Piety to make aware.

## 2018-04-27 ENCOUNTER — Encounter (HOSPITAL_COMMUNITY)
Admission: EM | Disposition: A | Discharge status: Home / Self Care | Payer: Self-pay | Source: Home / Self Care | Attending: Internal Medicine

## 2018-04-27 ENCOUNTER — Inpatient Hospital Stay (HOSPITAL_COMMUNITY): Payer: Medicare HMO | Admitting: Anesthesiology

## 2018-04-27 ENCOUNTER — Encounter (HOSPITAL_COMMUNITY): Payer: Self-pay | Admitting: *Deleted

## 2018-04-27 DIAGNOSIS — R110 Nausea: Secondary | ICD-10-CM

## 2018-04-27 HISTORY — PX: ESOPHAGOGASTRODUODENOSCOPY (EGD) WITH PROPOFOL: SHX5813

## 2018-04-27 HISTORY — PX: BIOPSY: SHX5522

## 2018-04-27 LAB — COMPREHENSIVE METABOLIC PANEL
ALT: 18 U/L (ref 0–44)
AST: 17 U/L (ref 15–41)
Albumin: 2.9 g/dL — ABNORMAL LOW (ref 3.5–5.0)
Alkaline Phosphatase: 56 U/L (ref 38–126)
Anion gap: 4 — ABNORMAL LOW (ref 5–15)
BUN: 34 mg/dL — ABNORMAL HIGH (ref 8–23)
CO2: 25 mmol/L (ref 22–32)
Calcium: 8.1 mg/dL — ABNORMAL LOW (ref 8.9–10.3)
Chloride: 110 mmol/L (ref 98–111)
Creatinine, Ser: 1.04 mg/dL (ref 0.61–1.24)
GFR calc Af Amer: >60 mL/min (ref >60)
GFR calc non Af Amer: >60 mL/min (ref >60)
Glucose, Bld: 108 mg/dL — ABNORMAL HIGH (ref 70–99)
Potassium: 3.7 mmol/L (ref 3.5–5.1)
Sodium: 139 mmol/L (ref 135–145)
Total Bilirubin: 1 mg/dL (ref 0.3–1.2)
Total Protein: 5 g/dL — ABNORMAL LOW (ref 6.5–8.1)

## 2018-04-27 LAB — CBC
HCT: 29.1 % — ABNORMAL LOW (ref 39.0–52.0)
Hemoglobin: 9.2 g/dL — ABNORMAL LOW (ref 13.0–17.0)
MCH: 32.4 pg (ref 26.0–34.0)
MCHC: 31.6 g/dL (ref 30.0–36.0)
MCV: 102.5 fL — ABNORMAL HIGH (ref 80.0–100.0)
Platelets: 140 10*3/uL — ABNORMAL LOW (ref 150–400)
RBC: 2.84 MIL/uL — ABNORMAL LOW (ref 4.22–5.81)
RDW: 13.5 % (ref 11.5–15.5)
WBC: 11.1 10*3/uL — ABNORMAL HIGH (ref 4.0–10.5)
nRBC: 0 % (ref 0.0–0.2)

## 2018-04-27 LAB — GLUCOSE, CAPILLARY: Glucose-Capillary: 96 mg/dL (ref 70–99)

## 2018-04-27 LAB — SURGICAL PCR SCREEN
MRSA, PCR: NEGATIVE
Staphylococcus aureus: NEGATIVE

## 2018-04-27 SURGERY — ESOPHAGOGASTRODUODENOSCOPY (EGD) WITH PROPOFOL
Anesthesia: Monitor Anesthesia Care

## 2018-04-27 MED ORDER — PROPOFOL 10 MG/ML IV BOLUS
INTRAVENOUS | Status: AC
  Filled 2018-04-27: qty 40

## 2018-04-27 MED ORDER — PANTOPRAZOLE SODIUM 40 MG IV SOLR: 40.0000 mg | Freq: Two times a day (BID) | INTRAVENOUS | Status: DC

## 2018-04-27 MED ORDER — PANTOPRAZOLE SODIUM 40 MG PO TBEC: 40.0000 mg | DELAYED_RELEASE_TABLET | Freq: Two times a day (BID) | ORAL | 0 refills | Status: DC

## 2018-04-27 MED ORDER — LACTATED RINGERS IV SOLN
INTRAVENOUS | Status: DC | PRN
  Administered 2018-04-27: 10:00:00 via INTRAVENOUS

## 2018-04-27 MED ORDER — PROPOFOL 10 MG/ML IV BOLUS
INTRAVENOUS | Status: DC | PRN
  Administered 2018-04-27 (×2): 20 mg via INTRAVENOUS

## 2018-04-27 MED ORDER — PROPOFOL 500 MG/50ML IV EMUL
INTRAVENOUS | Status: DC | PRN
  Administered 2018-04-27: 100 ug/kg/min via INTRAVENOUS

## 2018-04-27 SURGICAL SUPPLY — 15 items

## 2018-04-27 NOTE — Evaluation (Signed)
Physical Therapy Evaluation Patient Details Name: Carl [name] MRN: [id number] DOB: [birth year] Today's Date: 04/27/2018   History of Present Illness  78 yo male admitted with acute GI bleed. Hx of testicular ca, MI, CAD, OA.   Clinical Impression  On eval, pt required Min guard assist for mobility. He walked ~175 feet around the unit. Mildly unsteady at times. Discussed d/c plan-pt plans to return home. Instructed pt to use DME (cane vs RW) as needed. Recommend pt resume OP PT for continued strength and balance training, if he is agreeable.     Follow Up Recommendations recommend resume OP PT if pt is agreeable    Equipment Recommendations  None recommended by PT    Recommendations for Other Services       Precautions / Restrictions Precautions Precautions: Fall Restrictions Weight Bearing Restrictions: No      Mobility  Bed Mobility Overal bed mobility: Modified Independent                Transfers Overall transfer level: Modified independent                  Ambulation/Gait Ambulation/Gait assistance: Min guard Gait Distance (Feet): 175 Feet Assistive device: None Gait Pattern/deviations: Step-through pattern;Drifts right/left     General Gait Details: Mildly unsteady. Close guard for safety.   Stairs            Wheelchair Mobility    Modified Rankin (Stroke Patients Only)       Balance Overall balance assessment: Mild deficits observed, not formally tested                                           Pertinent Vitals/Pain Pain Assessment: No/denies pain    Home Living Family/patient expects to be discharged to:: Private residence Living Arrangements: Alone Available Help at Discharge: Family;Available PRN/intermittently Type of Home: House Home Access: Stairs to enter Entrance Stairs-Rails: None Entrance Stairs-Number of Steps: 1 Home Layout: One level Home Equipment: Walker - 2 wheels;Cane - single  point      Prior Function Level of Independence: Independent               Hand Dominance        Extremity/Trunk Assessment   Upper Extremity Assessment Upper Extremity Assessment: Overall WFL for tasks assessed    Lower Extremity Assessment Lower Extremity Assessment: Generalized weakness    Cervical / Trunk Assessment Cervical / Trunk Assessment: Normal  Communication   Communication: HOH  Cognition Arousal/Alertness: Awake/alert Behavior During Therapy: WFL for tasks assessed/performed Overall Cognitive Status: Within Functional Limits for tasks assessed                                        General Comments      Exercises     Assessment/Plan    PT Assessment Patient needs continued PT services  PT Problem List Decreased balance;Decreased strength       PT Treatment Interventions Gait training;Therapeutic activities;Functional mobility training;Balance training;Patient/family education;Therapeutic exercise    PT Goals (Current goals can be found in the Care Plan section)  Acute Rehab PT Goals Patient Stated Goal: to get better. home.  PT Goal Formulation: With patient Time For Goal Achievement: 05/11/18 Potential to Achieve Goals: Good    Frequency Min  3X/week   Barriers to discharge        Co-evaluation               AM-PAC PT "6 Clicks" Mobility  Outcome Measure Help needed turning from your back to your side while in a flat bed without using bedrails?: None Help needed moving from lying on your back to sitting on the side of a flat bed without using bedrails?: None Help needed moving to and from a bed to a chair (including a wheelchair)?: None Help needed standing up from a chair using your arms (e.g., wheelchair or bedside chair)?: None Help needed to walk in hospital room?: A Little Help needed climbing 3-5 steps with a railing? : A Little 6 Click Score: 22    End of Session Equipment Utilized During  Treatment: Gait belt Activity Tolerance: Patient tolerated treatment well Patient left: in bed;with call bell/phone within reach;with bed alarm set   PT Visit Diagnosis: Unsteadiness on feet (R26.81);Muscle weakness (generalized) (M62.81)    Time: 6808-8110 PT Time Calculation (min) (ACUTE ONLY): 11 min   Charges:   PT Evaluation $PT Eval Moderate Complexity: [place], PT Acute Rehabilitation Services Pager: [phone] Office: [phone]

## 2018-04-27 NOTE — Anesthesia Postprocedure Evaluation (Signed)
Anesthesia Post Note  Patient: Carl [name]  Procedure(s) Performed: ESOPHAGOGASTRODUODENOSCOPY (EGD) WITH PROPOFOL (N/A ) BIOPSY     Patient location during evaluation: Endoscopy Anesthesia Type: MAC Level of consciousness: awake and alert Pain management: pain level controlled Vital Signs Assessment: post-procedure vital signs reviewed and stable Respiratory status: spontaneous breathing, nonlabored ventilation, respiratory function stable and patient connected to nasal cannula oxygen Cardiovascular status: blood pressure returned to baseline and stable Postop Assessment: no apparent nausea or vomiting Anesthetic complications: no    Last Vitals:  Vitals:   04/27/18 1054 04/27/18 1137  BP: 105/61 103/65  Pulse: 68 70  Resp: 15 16  Temp:  36.7 C  SpO2: 97% 100%    Last Pain:  Vitals:   04/27/18 1137  TempSrc: Oral  PainSc:                  [name]

## 2018-04-27 NOTE — Brief Op Note (Signed)
04/25/2018 - 04/27/2018  10:40 AM  PATIENT:  Carl Carl [name]  78 y.o. male  PRE-OPERATIVE DIAGNOSIS:  Hemetemesis  POST-OPERATIVE DIAGNOSIS:  esophageal ulcer  PROCEDURE:  Procedure(s): ESOPHAGOGASTRODUODENOSCOPY (EGD) WITH PROPOFOL (N/A) BIOPSY  SURGEON:  Surgeon(s) and Role:    Ronnette Juniper, MD - Primary  PHYSICIAN ASSISTANT:   ASSISTANTS: Cleda Daub, RN, Samuel Germany, Tech    ANESTHESIA:   MAC  EBL:  Minimal  BLOOD ADMINISTERED:none  DRAINS: none   LOCAL MEDICATIONS USED:  NONE  SPECIMEN:  Biopsy / Limited Resection  DISPOSITION OF SPECIMEN:  PATHOLOGY  COUNTS:  YES  TOURNIQUET:  * No tourniquets in log *  DICTATION: .Dragon Dictation  PLAN OF CARE: Admit to inpatient   PATIENT DISPOSITION:  PACU - hemodynamically stable.   Carl [name] start of Pharmacological VTE agent (>24hrs) due to surgical blood loss or risk of bleeding: no

## 2018-04-27 NOTE — Transfer of Care (Signed)
Immediate Anesthesia Transfer of Care Note  Patient: Carl [name]  Procedure(s) Performed: ESOPHAGOGASTRODUODENOSCOPY (EGD) WITH PROPOFOL (N/A ) BIOPSY  Patient Location: PACU and Endoscopy Unit  Anesthesia Type:MAC  Level of Consciousness: awake, alert , oriented and patient cooperative  Airway & Oxygen Therapy: Patient Spontanous Breathing and Patient connected to nasal cannula oxygen  Post-op Assessment: Report given to RN, Post -op Vital signs reviewed and stable and Patient moving all extremities  Post vital signs: Reviewed and stable  Last Vitals:  Vitals Value Taken Time  BP 85/57 04/27/2018 10:43 AM  Temp    Pulse 65 04/27/2018 10:44 AM  Resp 15 04/27/2018 10:44 AM  SpO2 94 % 04/27/2018 10:44 AM  Vitals shown include unvalidated device data.  Last Pain:  Vitals:   04/27/18 0908  TempSrc:   PainSc: 0-No pain         Complications: No apparent anesthesia complications

## 2018-04-27 NOTE — Op Note (Addendum)
[facility] Patient Name: Carl [name] Procedure Date: 04/27/2018 MRN: [id number] Attending MD: Ronnette Juniper , MD Date of Birth: April 20, 1940 CSN: [id number] Age: 78 Admit Type: Inpatient Procedure:                Upper GI endoscopy Indications:              Hematemesis Providers:                Ronnette Juniper, MD, Cleda Daub, RN, William Dalton,                            Technician Referring MD:              Medicines:                Monitored Anesthesia Care Complications:            No immediate complications. Estimated blood loss:                            Minimal. Estimated Blood Loss:     Estimated blood loss was minimal. Procedure:                Pre-Anesthesia Assessment:                           - Prior to the procedure, a History and Physical                            was performed, and patient medications and                            allergies were reviewed. The patient's tolerance of                            previous anesthesia was also reviewed. The risks                            and benefits of the procedure and the sedation                            options and risks were discussed with the patient.                            All questions were answered, and informed consent                            was obtained. Prior Anticoagulants: The patient has                            taken aspirin, last dose was 2 days prior to                            procedure. ASA Grade Assessment: III - A patient                            with severe  systemic disease. After reviewing the                            risks and benefits, the patient was deemed in                            satisfactory condition to undergo the procedure.                           After obtaining informed consent, the endoscope was                            passed under direct vision. Throughout the                            procedure, the patient's blood pressure, pulse, and                             oxygen saturations were monitored continuously. The                            GIF-H190 ([id number] Olympus gastroscope was                            introduced through the mouth, and advanced to the                            second part of duodenum. The upper GI endoscopy was                            accomplished without difficulty. The patient                            tolerated the procedure well. Scope In: Scope Out: Findings:      One linear and superficial esophageal ulcer with no bleeding and no       stigmata of recent bleeding was found 38 to 40 cm from the incisors.       Possible Mallory Weiss tear or reflux related.      Localized mildly erythematous mucosa without bleeding was found in the       gastric antrum. Biopsies were taken with a cold forceps for Helicobacter       pylori testing.      The cardia and gastric fundus were normal on retroflexion.      The examined duodenum was normal. Impression:               - Non-bleeding esophageal ulcer.                           - Erythematous mucosa in the antrum. Biopsied.                           - Normal examined duodenum. Moderate Sedation:      Patient did not receive moderate sedation for this procedure, but       instead received monitored anesthesia care.  Recommendation:           - Resume regular diet.                           - Await pathology results.                           - Use Protonix (pantoprazole) 40 mg PO BID for 4                            weeks. Procedure Code(s):        --- Professional ---                           [phone], Esophagogastroduodenoscopy, flexible,                            transoral; with biopsy, single or multiple Diagnosis Code(s):        --- Professional ---                           K22.10, Ulcer of esophagus without bleeding                           K31.89, Other diseases of stomach and duodenum                           K92.0, Hematemesis CPT  copyright 2018 [employer] Medical Association. All rights reserved. The codes documented in this report are preliminary and upon coder review may  be revised to meet current compliance requirements. Ronnette Juniper, MD 04/27/2018 10:40:34 AM This report has been signed electronically. Number of Addenda: 0

## 2018-04-27 NOTE — Discharge Summary (Signed)
Physician Discharge Summary  Carl [name]:[id number] DOB: 1941-01-22 DOA: 04/25/2018  PCP: [name], MD  Admit date: 04/25/2018 Discharge date: 04/27/2018  Admitted From: Home Disposition: Home  Recommendations for Outpatient Follow-up:  1. Follow up with PCP in 1-2 weeks 2. Please obtain BMP/CBC in one week 3. Please follow up on the following pending results: Biopsy during EGD  Home Health: No Equipment/Devices: None  Discharge Condition: Stable CODE STATUS: Full Diet recommendation: Regular diet  Brief/Interim Summary:  #) Acute blood loss anemia due to acute GI bleed: Patient was admitted with hematemesis after recently getting intrathecal methotrexate.  Hemoglobin down trended to between 10 and 9 but was stable.  Patient did not have any further episodes of hematemesis.  An EGD showed evidence of either an esophageal ulcer or Mallory-Weiss tear.  Patient initially was on IV PPI drip but then transition to p.o. PPI twice daily for 4 weeks.  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  Patient did receive recent intrathecal chemotherapy.  #) Hypertension/hyperlipidemia coronary artery disease status post stent in 2016: Initially aspirin and clopidogrel were held.  These may be restarted on discharge.  Patient was continued on ARB, carvedilol, statin.  Discharge Diagnoses:  Principal Problem:   Acute GI bleeding Active Problems:   Diffuse large B-cell lymphoma of solid organ excluding spleen (HCC)   CAD (coronary artery disease)    Discharge Instructions  Discharge Instructions    Call MD for:  difficulty breathing, headache or visual disturbances   Complete by:  As directed    Call MD for:  hives   Complete by:  As directed    Call MD for:  persistant dizziness or light-headedness   Complete by:  As directed    Call MD for:  persistant nausea and vomiting   Complete by:  As directed    Call  MD for:  redness, tenderness, or signs of infection (pain, swelling, redness, odor or green/yellow discharge around incision site)   Complete by:  As directed    Call MD for:  severe uncontrolled pain   Complete by:  As directed    Call MD for:  temperature >100.4   Complete by:  As directed    Diet - low sodium heart healthy   Complete by:  As directed    Discharge instructions   Complete by:  As directed    Please follow-up with a primary care doctor in 1 week.  Please follow-up with GI doctor as an outpatient.   Increase activity slowly   Complete by:  As directed      Allergies as of 04/27/2018   No Known Allergies     Medication List    TAKE these medications   aspirin EC 81 MG tablet Take 81 mg by mouth daily.   atorvastatin 40 MG tablet Commonly known as:  LIPITOR Take 1 tablet (40 mg total) by mouth daily at 6 PM.   carvedilol 6.25 MG tablet Commonly known as:  COREG Take 6.25 mg by mouth 2 (two) times daily with a meal.   cholecalciferol 1000 units tablet Commonly known as:  VITAMIN D Take 1,000 Units by mouth daily.   losartan 25 MG tablet Commonly known as:  COZAAR Take 25 mg by mouth every evening.   multivitamin with minerals Tabs tablet Take 1 tablet by mouth daily.   nitroGLYCERIN 0.4 MG SL tablet Commonly known as:  NITROSTAT Place 1 tablet (  0.4 mg total) under the tongue every 5 (five) minutes x 3 doses as needed for chest pain.   ondansetron 8 MG tablet Commonly known as:  Zofran Take 1 tablet (8 mg total) by mouth 2 (two) times daily as needed for refractory nausea / vomiting. Start on day 3 after cyclophosphamide chemotherapy.   pantoprazole 40 MG tablet Commonly known as:  Protonix Take 1 tablet (40 mg total) by mouth 2 (two) times daily for 30 days.   polyethylene glycol packet Commonly known as:  MiraLax Take 17 g by mouth daily.   predniSONE 20 MG tablet Commonly known as:  DELTASONE Take 3 tablets (60 mg total) by mouth daily. Take  on days 1-5 of chemotherapy.   prochlorperazine 10 MG tablet Commonly known as:  COMPAZINE Take 1 tablet (10 mg total) by mouth every 6 (six) hours as needed (Nausea or vomiting).   senna-docusate 8.6-50 MG tablet Commonly known as:  Senna S Take 2 tablets by mouth at bedtime as needed for mild constipation or moderate constipation.   SYSTANE OP Place 1 drop into both eyes at bedtime.   Vaseline Gel Generic drug:  white petrolatum Apply 1 application topically as needed for dry skin (itching).       No Known Allergies  Consultations:  [facility]   Procedures/Studies: Dg Chest 2 View  Result Date: 04/25/2018 CLINICAL DATA:  Vomiting EXAM: CHEST - 2 VIEW COMPARISON:  10/15/2014 FINDINGS: Right Port-A-Cath in place with the tip in the SVC. Heart is upper limits normal in size. No confluent airspace opacities or effusions. No acute bony abnormality. IMPRESSION: No active cardiopulmonary disease. Electronically Signed   By: Rolm Baptise M.D.   On: 04/25/2018 23:59   Dg Fl Guided Theraputic Lumbar Puncture  Result Date: 04/25/2018 CLINICAL DATA:  Testicular B-cell lymphoma. Prophylactic intrathecal chemotherapy. EXAM: FLUOROSCOPICALLY GUIDED LUMBAR PUNCTURE FOR INTRATHECAL CHEMOTHERAPY FLUOROSCOPY TIME:  1 minutes 6 seconds PROCEDURE: Informed consent was obtained from the patient prior to the procedure, including potential complications of headache, allergy, and pain. With the patient prone, the lower back was prepped with Betadine. 1% Lidocaine was used for local anesthesia. Lumbar puncture was performed at the L5-S1 level using a gauge needle with return of L5-S1CSF. 15 mg of methotrexate was injected into the subarachnoid space. The patient tolerated the procedure well without apparent complication. IMPRESSION: Successful intrathecal administration of chemotherapy. First treatment. Electronically Signed   By: Suzy Bouchard M.D.   On: 04/25/2018 12:00    04/27/2018  EGD:- Non-bleeding esophageal ulcer. - Erythematous mucosa in the antrum. Biopsied. - Normal examined duodenum.   Subjective:   Discharge Exam: Vitals:   04/27/18 1043 04/27/18 1054  BP: 97/65 105/61  Pulse: 66 68  Resp: 17 15  Temp: (!) 97.4 F (36.3 C)   SpO2: 97% 97%   Vitals:   04/27/18 0635 04/27/18 0908 04/27/18 1043 04/27/18 1054  BP:  103/66 97/65 105/61  Pulse:  62 66 68  Resp:  11 17 15   Temp:  97.7 F (36.5 C) (!) 97.4 F (36.3 C)   TempSrc:   Oral   SpO2:  98% 97% 97%  Weight: 108.4 kg 107 kg    Height:  6\' 1"  (1.854 m)      General: Pt is alert, awake, not in acute distress Cardiovascular: RRR, S1/S2 +, no rubs, no gallops Respiratory: CTA bilaterally, no wheezing, no rhonchi Abdominal: Soft, NT, ND, bowel sounds + Extremities: no edema    The results of significant  diagnostics from this hospitalization (including imaging, microbiology, ancillary and laboratory) are listed below for reference.     Microbiology: Recent Results (from the past 240 hour(s))  Surgical PCR screen     Status: None   Collection Time: 04/27/18 12:32 AM  Result Value Ref Range Status   MRSA, PCR NEGATIVE NEGATIVE Final   Staphylococcus aureus NEGATIVE NEGATIVE Final    Comment: (NOTE) The Xpert SA Assay (FDA approved for NASAL specimens in patients 101 years of age and older), is one component of a comprehensive surveillance program. It is not intended to diagnose infection nor to guide or monitor treatment. Performed at [facility], [place] [address] [place], [place] [address]      Labs: BNP (last 3 results) No results for input(s): BNP in the last 8760 hours. Basic Metabolic Panel: Recent Labs  Lab 04/25/18 2332 04/26/18 0533 04/27/18 0509  NA 141 142 139  K 4.2 4.4 3.7  CL 109 112* 110  CO2 25 23 25   GLUCOSE 160* 147* 108*  BUN 15 20 34*  CREATININE 0.95 0.87 1.04  CALCIUM 9.0 8.5* 8.1*   Liver Function Tests: Recent Labs  Lab  04/25/18 2332 04/26/18 0533 04/27/18 0509  AST 24 18 17   ALT 24 19 18   ALKPHOS 94 74 56  BILITOT 1.0 0.9 1.0  PROT 7.3 5.7* 5.0*  ALBUMIN 3.7 3.0* 2.9*   No results for input(s): LIPASE, AMYLASE in the last 168 hours. No results for input(s): AMMONIA in the last 168 hours. CBC: Recent Labs  Lab 04/25/18 2332 04/26/18 0533 04/26/18 1358 04/27/18 0509  WBC 11.0* 18.3* 26.8* 11.1*  NEUTROABS 10.4*  --   --   --   HGB 13.4 11.5* 10.7* 9.2*  HCT 41.4 36.8* 33.5* 29.1*  MCV 100.5* 103.1* 102.4* 102.5*  PLT 185 184 180 140*   Cardiac Enzymes: No results for input(s): CKTOTAL, CKMB, CKMBINDEX, TROPONINI in the last 168 hours. BNP: Invalid input(s): POCBNP CBG: Recent Labs  Lab 04/26/18 0538 04/26/18 2345 04/27/18 0823  GLUCAP 139* 83 96   D-Dimer No results for input(s): DDIMER in the last 72 hours. Hgb A1c No results for input(s): HGBA1C in the last 72 hours. Lipid Profile No results for input(s): CHOL, HDL, LDLCALC, TRIG, CHOLHDL, LDLDIRECT in the last 72 hours. Thyroid function studies No results for input(s): TSH, T4TOTAL, T3FREE, THYROIDAB in the last 72 hours.  Invalid input(s): FREET3 Anemia work up No results for input(s): VITAMINB12, FOLATE, FERRITIN, TIBC, IRON, RETICCTPCT in the last 72 hours. Urinalysis    Component Value Date/Time   COLORURINE YELLOW 02/16/2014 Lead 02/16/2014 1155   LABSPEC 1.022 02/16/2014 1155   PHURINE 5.5 02/16/2014 1155   GLUCOSEU NEGATIVE 02/16/2014 1155   HGBUR TRACE (A) 02/16/2014 1155   BILIRUBINUR NEGATIVE 02/16/2014 1155   KETONESUR NEGATIVE 02/16/2014 1155   PROTEINUR NEGATIVE 02/16/2014 1155   UROBILINOGEN 1.0 02/16/2014 1155   NITRITE NEGATIVE 02/16/2014 1155   LEUKOCYTESUR NEGATIVE 02/16/2014 1155   Sepsis Labs Invalid input(s): PROCALCITONIN,  WBC,  LACTICIDVEN Microbiology Recent Results (from the past 240 hour(s))  Surgical PCR screen     Status: None   Collection Time: 04/27/18 12:32  AM  Result Value Ref Range Status   MRSA, PCR NEGATIVE NEGATIVE Final   Staphylococcus aureus NEGATIVE NEGATIVE Final    Comment: (NOTE) The Xpert SA Assay (FDA approved for NASAL specimens in patients 93 years of age and older), is one component of a comprehensive surveillance  program. It is not intended to diagnose infection nor to guide or monitor treatment. Performed at [facility], [place] [address], [place], [place] [address]      Time coordinating discharge: 16  SIGNED:   Cristy Folks, MD  [facility] 04/27/2018, 11:11 AM  If 7PM-7AM, please contact night-coverage [email or web address] Password [email or web address]

## 2018-04-27 NOTE — Discharge Instructions (Signed)
Gastrointestinal Bleeding ° °Gastrointestinal bleeding is bleeding somewhere along the path food travels through the body (digestive tract). This path is anywhere between the mouth and the opening of the butt (anus). You may have blood in your poop (stools) or have black poop. If you throw up (vomit), there may be blood in it. °This condition can be mild, serious, or even life-threatening. If you have a lot of bleeding, you may need to stay in the hospital. °Follow these instructions at home: °· Take over-the-counter and prescription medicines only as told by your doctor. °· Eat foods that have a lot of fiber in them. These foods include whole grains, fruits, and vegetables. You can also try eating 1-3 prunes each day. °· Drink enough fluid to keep your pee (urine) clear or pale yellow. °· Keep all follow-up visits as told by your doctor. This is important. °Contact a doctor if: °· Your symptoms do not get better. °Get help right away if: °· Your bleeding gets worse. °· You feel dizzy or you pass out (faint). °· You feel weak. °· You have very bad cramps in your back or belly (abdomen). °· You pass large clumps of blood (clots) in your poop. °· Your symptoms are getting worse. °This information is not intended to replace advice given to you by your health care provider. Make sure you discuss any questions you have with your health care provider. °Document Released: 11/05/2007 Document Revised: 07/04/2015 Document Reviewed: 07/16/2014 °Elsevier Interactive Patient Education © 2019 [employer] Inc. ° °

## 2018-04-27 NOTE — Interval H&P Note (Signed)
History and Physical Interval Note: 78/male with hematemesis for EGD.  04/27/2018 9:42 AM  Carl [name]  has presented today for EGD, with the diagnosis of Hemetemesis.  The various methods of treatment have been discussed with the patient and family. After consideration of risks, benefits and other options for treatment, the patient has consented to  Procedure(s): ESOPHAGOGASTRODUODENOSCOPY (EGD) WITH PROPOFOL (N/A) as a surgical intervention.  The patient's history has been reviewed, patient examined, no change in status, stable for surgery.  I have reviewed the patient's chart and labs.  Questions were answered to the patient's satisfaction.     Ronnette Juniper

## 2018-04-28 ENCOUNTER — Ambulatory Visit: Payer: Medicare HMO

## 2018-04-28 LAB — GLUCOSE, CAPILLARY: Glucose-Capillary: 88 mg/dL (ref 70–99)

## 2018-04-29 ENCOUNTER — Encounter (HOSPITAL_COMMUNITY): Payer: Self-pay | Admitting: Gastroenterology

## 2018-05-02 ENCOUNTER — Other Ambulatory Visit: Payer: Self-pay | Admitting: *Deleted

## 2018-05-02 ENCOUNTER — Ambulatory Visit: Payer: Medicare HMO

## 2018-05-02 NOTE — Patient Outreach (Signed)
Carl [name] [facility]) Care Management  05/02/2018  Carl [name] 1940-07-01 [id number]   Subjective: Telephone call to patient's home number, no answer, left HIPAA compliant voicemail message, and requested call back.    Objective: Per KPN (Knowledge Performance Now, point of care tool) and chart review, patient hospitalized 04/25/2018 - 04/27/2018 for acute gastrointestinal bleed.  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       Assessment: Received [name] follow up referral on 05/02/2018.  Red Alert Flag Trigger, Day #1, patient answered no the following question: Know who to call about changes in condition?   [name] EMMI follow up pending patient contact.      Plan: RNCM will send unsuccessful outreach letter, [facility] pamphlet, handout: Know Before You Go, will call patient for 2nd telephone outreach attempt within 4 business days, [facility] EMMI follow up, and proceed with case closure, within 10 business days if no return call.       [name] H. Annia Friendly, BSN, [place] Management [facility] Telephonic CM Phone: [phone] Fax: [phone]

## 2018-05-03 ENCOUNTER — Other Ambulatory Visit: Payer: Self-pay | Admitting: *Deleted

## 2018-05-03 NOTE — Patient Outreach (Signed)
[facility]) Care Management  05/03/2018  Carl [name] Apr 22, 1940 [id number]   Subjective: Received voicemail message from [facility], states he is returning call, and requested call back.  Telephone call to patient's home number, spoke with patient, and HIPAA verified.  Discussed [facility] EMMI General Discharge Red Alert follow up, patient voiced understanding, and is in agreement to follow up.   Patient states he is doing okay and  remembers receiving EMMI automated calls.  Discussed signs / symptoms to report to MD, how to reach on-call provider after hours, patient voices understanding, states he has a follow up appointment with primary MD on 05/05/2018, cardiologist on 05/12/2018, and [facility] provider on 05/12/2018.   Patient states he does not have any education material, EMMI follow up, care coordination, care management, disease monitoring, transportation, community resource, or pharmacy needs at this time.  States he is very appreciative of the follow up.      Objective: Per KPN (Knowledge Performance Now, point of care tool) and chart review, patient hospitalized 04/25/2018 - 04/27/2018 for acute gastrointestinal bleed.  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       Assessment: Received [facility] EMMI General Discharge Red Alert Flag follow up referral on 05/02/2018. Red Alert Flag Trigger, Day #1, patient answered no the following question: Know who to call about changes in condition?   EMMI follow up completed and no further care management needs.      Plan: RNCM will complete case closure due to follow up completed / no care management needs.       Wynne Jury H. Annia Friendly, BSN, [place] Management [facility] Telephonic CM Phone: [phone] Fax: [phone]

## 2018-05-05 DIAGNOSIS — K922 Gastrointestinal hemorrhage, unspecified: Secondary | ICD-10-CM | POA: Diagnosis not present

## 2018-05-05 DIAGNOSIS — Z09 Encounter for follow-up examination after completed treatment for conditions other than malignant neoplasm: Secondary | ICD-10-CM | POA: Diagnosis not present

## 2018-05-05 DIAGNOSIS — D649 Anemia, unspecified: Secondary | ICD-10-CM | POA: Diagnosis not present

## 2018-05-10 ENCOUNTER — Telehealth: Payer: Self-pay | Admitting: Physical Therapy

## 2018-05-10 NOTE — Telephone Encounter (Signed)
Left message for pt to follow up with PT as our clinic is closed to to coronavirus.  Told pt I would call back to check on him later in the week, but if had urgent questions, he could call us at [place], PT 3//32/2020 @ 2:34 PM

## 2018-05-12 DIAGNOSIS — R7303 Prediabetes: Secondary | ICD-10-CM | POA: Diagnosis not present

## 2018-05-12 DIAGNOSIS — I252 Old myocardial infarction: Secondary | ICD-10-CM | POA: Diagnosis not present

## 2018-05-12 DIAGNOSIS — I255 Ischemic cardiomyopathy: Secondary | ICD-10-CM | POA: Diagnosis not present

## 2018-05-12 DIAGNOSIS — E785 Hyperlipidemia, unspecified: Secondary | ICD-10-CM | POA: Diagnosis not present

## 2018-05-12 DIAGNOSIS — I2510 Atherosclerotic heart disease of native coronary artery without angina pectoris: Secondary | ICD-10-CM | POA: Diagnosis not present

## 2018-05-12 DIAGNOSIS — M1990 Unspecified osteoarthritis, unspecified site: Secondary | ICD-10-CM | POA: Diagnosis not present

## 2018-05-12 DIAGNOSIS — I10 Essential (primary) hypertension: Secondary | ICD-10-CM | POA: Diagnosis not present

## 2018-05-16 ENCOUNTER — Other Ambulatory Visit: Payer: Self-pay | Admitting: Medical Oncology

## 2018-05-16 ENCOUNTER — Telehealth: Payer: Self-pay | Admitting: Physical Therapy

## 2018-05-16 NOTE — Telephone Encounter (Signed)
Spoke with patient.  He says he is doing fine and staying active. He has not questions about his exercise at this time.  He may consider coming back to PT when we reopen depending on what his doctor says. He appreciated the call  Maudry Diego, PT 05/16/2018 @ 1:12 PM .

## 2018-05-16 NOTE — Progress Notes (Signed)
HEMATOLOGY/ONCOLOGY CLINIC NOTE  Date of Service: 05/17/2018  Patient Care Team: [name], MD as PCP - General (Family Medicine)  CHIEF COMPLAINTS/PURPOSE OF CONSULTATION:  Primary testicular diffuse Large B-Cell Lymphoma   HISTORY OF PRESENTING ILLNESS:   Carl [name] is a wonderful 78 y.o. male who has been referred to [place] by Dr. [name] for evaluation and management of Diffuse Large B-Cell Lymphoma. The pt reports that he is doing well overall.  The pt notes that he first began feeling differently about 4-5 months ago, when he noticed left testicular swelling. He notes that the his testicle was "a little bit larger than a golf ball," and that this was not painful. The pt notes that he has not had any abdominal distension or abdominal pain. The pt denies any fevers, chills, night sweats, or unexpected weight loss. He also denies any leg swelling. The pt denies any trauma sustained by the testicles. He denies noticing any other related symptoms.   The pt reports that he believed he had food poisoning prior to this, as he had lots of diarrhea. He notes that he has observed a small knot on the back of his right wrist, which has not changed over the last year.  The pt has two stents in his heart which were placed in 2016. The pt notes that his blood pressure has been stable and well-controlled. He notes that his last ECHO was 6 months ago, and he has not had any CP or SOB since that time. He takes 81mg  aspirin daily. He also takes Atorvastatin. The pt denies feeling limited in his walking by SOB or CP. He notes that he could walk 2 miles before ceasing to walk, noting that feeling tired in his muscles would limit him.   The pt has recently pursued surgery on his throat with ENT Dr. Melida Quitter for altered voice and hoarseness related to his vocal cords.   The pt notes that he currently lives by himself, and is not married. He has a daughter who is 32 years old. He  notes that his daughter "tries to help out." He notes that he does not have a power of attorney, but that this would be his daughter.   Of note prior to the patient's visit today, pt has had a Left Testis Surgical biopsy completed on 01/05/18 with results revealing Diffuse Large B-Cell Lymphoma.   The pt also had an US Scrotum on 12/08/17 which revealed Abnormal appearance of the left testicle which is larger than the right and diffusely heterogeneous in echotexture. Cannot exclude infiltrating process/mass. Appearance is concerning for possible neoplasm.  Most recent lab results (11/26/17) of BMP revealed all values WNL except for Glucose at 104, Creatinine at 1.30, Calcium at 8.7, GFR at 59 11/26/17 Hemoglobin value at 13.6.   On review of systems, pt reports stable energy levels, left testicular swelling, and denies fevers, chills, night sweats, unexpected weight loss, CP, SOB, back pains, flank pains, abdominal pains, leg swelling, testicular pain, and any other symptoms.   On PMHx the pt reports CAD, two heart stents placed in 2016. On Social Hx the pt denies smoking.  On Family Hx the pt reports mother with lymphoma of the breast. Two sisters with breast cancer, one at age 31.  Interval History:   Carl [name] returns today for management and evaluation of his Primary testicular Diffuse Large B-Cell Lymphoma after C3 R-mini-CHOP. The patient's last visit with [place] was on 04/19/18.  The pt reports that he is doing well overall.  The pt reports that he has been doing well since his discharge in mid March, at which time he developed hematemesis and nausea after receiving his first IT MTX. The pt notes that he ate breakfast the morning of receiving IT MTX. The pt had an upper endoscopy, as noted below. The pt has returned to Plavix and aspirin after discharge. He notes that he is eating well and sleeping well. He denies any further nausea, vomiting, or concerns of bleeding. He adds that he  has been staying active with walking and exercising.   Of note since the patient's last visit, pt has had an upper endoscopy completed on 04/27/18 with results revealing non-bleeding esophageal ulcer, erythematous mucosa in the antrum, normal examined duodenum.  Lab results today (05/17/18) of CBC w/diff and CMP is as follows: all values are WNL except for WBC at 3.8k, RBC at 3.76, HGB at 11.9, HCT at 37.3, Glucose at 108, Calcium at 8.6, Total Protein at 6.2, Albumin at 3.3.  On review of systems, pt reports eating well, sleeping well, keeping active, and denies nausea, vomiting, concerns for bleeding, mouth sores, abdominal pains, leg swelling, and any other symptoms.    MEDICAL HISTORY:  Past Medical History:  Diagnosis Date  . Arthritis   . Coronary artery disease   . HOH (hard of hearing)   . Hyperlipidemia   . Hypertension   . Ischemic cardiomyopathy   . Myocardial infarction ([place]) 2016  . Quadriceps tendon rupture    LEFT    SURGICAL HISTORY: Past Surgical History:  Procedure Laterality Date  . BIOPSY  04/27/2018   Procedure: BIOPSY;  Surgeon: Ronnette Juniper, MD;  Location: [facility];  Service: Gastroenterology;;  . CARDIAC CATHETERIZATION N/A 10/15/2014   Procedure: Left Heart Cath and Coronary Angiography;  Surgeon: Charolette Forward, MD;  Location: [facility];  Service: Cardiovascular;  Laterality: N/A;  . COLONOSCOPY    . ESOPHAGOGASTRODUODENOSCOPY (EGD) WITH PROPOFOL N/A 04/27/2018   Procedure: ESOPHAGOGASTRODUODENOSCOPY (EGD) WITH PROPOFOL;  Surgeon: Ronnette Juniper, MD;  Location: [facility];  Service: Gastroenterology;  Laterality: N/A;  . IR IMAGING GUIDED PORT INSERTION  03/14/2018  . MICROLARYNGOSCOPY WITH CO2 LASER AND EXCISION OF VOCAL CORD LESION N/A 11/26/2017   Procedure: MICROLARYNGOSCOPY WITH CO2 LASER AND EXCISION OF VOCAL CORD LESION WITH JET VENTILATION;  Surgeon: Melida Quitter, MD;  Location: [facility];  Service: ENT;  Laterality: N/A;  . QUADRICEPS TENDON REPAIR  Left 02/20/2014   Procedure: LEFT REPAIR QUADRICEP TENDON;  Surgeon: Mauri Pole, MD;  Location: [facility];  Service: Orthopedics;  Laterality: Left;  . THROAT SURGERY  2010    SOCIAL HISTORY: Social History   Socioeconomic History  . Marital status: Divorced    Spouse name: Not on file  . Number of children: Not on file  . Years of education: Not on file  . Highest education level: Not on file  Occupational History  . Not on file  Social Needs  . Financial resource strain: Not on file  . Food insecurity:    Worry: Not on file    Inability: Not on file  . Transportation needs:    Medical: Not on file    Non-medical: Not on file  Tobacco Use  . Smoking status: Never Smoker  . Smokeless tobacco: Never Used  Substance and Sexual Activity  . Alcohol use: Not Currently  . Drug use: No  . Sexual activity: Not on file  Lifestyle  . Physical activity:    Days per week: Not on file    Minutes per session: Not on file  . Stress: Not on file  Relationships  . Social connections:    Talks on phone: Not on file    Gets together: Not on file    Attends religious service: Not on file    Active member of club or organization: Not on file    Attends meetings of clubs or organizations: Not on file    Relationship status: Not on file  . Intimate partner violence:    Fear of current or ex partner: Not on file    Emotionally abused: Not on file    Physically abused: Not on file    Forced sexual activity: Not on file  Other Topics Concern  . Not on file  Social History Narrative  . Not on file    FAMILY HISTORY: No family history on file.  ALLERGIES:  has No Known Allergies.  MEDICATIONS:  Current Outpatient Medications  Medication Sig Dispense Refill  . aspirin EC 81 MG tablet Take 81 mg by mouth daily.    Marland Kitchen atorvastatin (LIPITOR) 40 MG tablet Take 1 tablet (40 mg total) by mouth daily at 6 PM. 60 tablet 3  . carvedilol (COREG) 6.25 MG tablet Take 6.25 mg by mouth 2 (two)  times daily with a meal.    . cholecalciferol (VITAMIN D) 1000 UNITS tablet Take 1,000 Units by mouth daily.    Marland Kitchen losartan (COZAAR) 25 MG tablet Take 25 mg by mouth every evening.    . Multiple Vitamin (MULTIVITAMIN WITH MINERALS) TABS tablet Take 1 tablet by mouth daily.    . nitroGLYCERIN (NITROSTAT) 0.4 MG SL tablet Place 1 tablet (0.4 mg total) under the tongue every 5 (five) minutes x 3 doses as needed for chest pain. 25 tablet 12  . ondansetron (ZOFRAN) 8 MG tablet Take 1 tablet (8 mg total) by mouth 2 (two) times daily as needed for refractory nausea / vomiting. Start on day 3 after cyclophosphamide chemotherapy. 30 tablet 1  . pantoprazole (PROTONIX) 40 MG tablet Take 1 tablet (40 mg total) by mouth 2 (two) times daily for 30 days. 60 tablet 0  . Polyethyl Glycol-Propyl Glycol (SYSTANE OP) Place 1 drop into both eyes at bedtime.     . polyethylene glycol (MIRALAX) packet Take 17 g by mouth daily. 30 each 1  . predniSONE (DELTASONE) 20 MG tablet Take 3 tablets (60 mg total) by mouth daily. Take on days 1-5 of chemotherapy. 15 tablet 5  . prochlorperazine (COMPAZINE) 10 MG tablet Take 1 tablet (10 mg total) by mouth every 6 (six) hours as needed (Nausea or vomiting). 30 tablet 6  . senna-docusate (SENNA S) 8.6-50 MG tablet Take 2 tablets by mouth at bedtime as needed for mild constipation or moderate constipation. 60 tablet 1  . white petrolatum (VASELINE) GEL Apply 1 application topically as needed for dry skin (itching).     No current facility-administered medications for this visit.     REVIEW OF SYSTEMS:    A 10+ POINT REVIEW OF SYSTEMS WAS OBTAINED including neurology, dermatology, psychiatry, cardiac, respiratory, lymph, extremities, GI, GU, Musculoskeletal, constitutional, breasts, reproductive, HEENT.  All pertinent positives are noted in the HPI.  All others are negative.   PHYSICAL EXAMINATION: ECOG PERFORMANCE STATUS: 2 - Symptomatic, <50% confined to bed  Vitals:    05/17/18 0906  BP: 117/75  Pulse: 62  Resp: 18  Temp: (!)  97.5 F (36.4 C)  SpO2: 98%   Filed Weights   05/17/18 0906  Weight: 240 lb 14.4 oz (109.3 kg)   .Body mass index is 31.78 kg/m.  GENERAL:alert, in no acute distress and comfortable SKIN: no acute rashes, no significant lesions EYES: conjunctiva are pink and non-injected, sclera anicteric OROPHARYNX: MMM, no exudates, no oropharyngeal erythema or ulceration NECK: supple, no JVD LYMPH:  no palpable lymphadenopathy in the cervical, axillary or inguinal regions LUNGS: clear to auscultation b/l with normal respiratory effort HEART: regular rate & rhythm ABDOMEN:  normoactive bowel sounds , non tender, not distended. No palpable hepatosplenomegaly.  Extremity: no pedal edema PSYCH: alert & oriented x 3 with fluent speech NEURO: no focal motor/sensory deficits   LABORATORY DATA:  I have reviewed the data as listed  . CBC Latest Ref Rng & Units 05/17/2018 04/27/2018 04/26/2018  WBC 4.0 - 10.5 K/uL 3.8(L) 11.1(H) 26.8(H)  Hemoglobin 13.0 - 17.0 g/dL 11.9(L) 9.2(L) 10.7(L)  Hematocrit 39.0 - 52.0 % 37.3(L) 29.1(L) 33.5(L)  Platelets 150 - 400 K/uL 166 140(L) 180    . CMP Latest Ref Rng & Units 05/17/2018 04/27/2018 04/26/2018  Glucose 70 - 99 mg/dL 108(H) 108(H) 147(H)  BUN 8 - 23 mg/dL 11 34(H) 20  Creatinine 0.61 - 1.24 mg/dL 1.09 1.04 0.87  Sodium 135 - 145 mmol/L 142 139 142  Potassium 3.5 - 5.1 mmol/L 3.7 3.7 4.4  Chloride 98 - 111 mmol/L 108 110 112(H)  CO2 22 - 32 mmol/L 25 25 23   Calcium 8.9 - 10.3 mg/dL 8.6(L) 8.1(L) 8.5(L)  Total Protein 6.5 - 8.1 g/dL 6.2(L) 5.0(L) 5.7(L)  Total Bilirubin 0.3 - 1.2 mg/dL 0.7 1.0 0.9  Alkaline Phos 38 - 126 U/L 62 56 74  AST 15 - 41 U/L 19 17 18   ALT 0 - 44 U/L 16 18 19    01/05/18 Pathology:    RADIOGRAPHIC STUDIES: I have personally reviewed the radiological images as listed and agreed with the findings in the report. Dg Chest 2 View  Result Date: 04/25/2018 CLINICAL  DATA:  Vomiting EXAM: CHEST - 2 VIEW COMPARISON:  10/15/2014 FINDINGS: Right Port-A-Cath in place with the tip in the SVC. Heart is upper limits normal in size. No confluent airspace opacities or effusions. No acute bony abnormality. IMPRESSION: No active cardiopulmonary disease. Electronically Signed   By: Rolm Baptise M.D.   On: 04/25/2018 23:59   Dg Fl Guided Theraputic Lumbar Puncture  Result Date: 04/25/2018 CLINICAL DATA:  Testicular B-cell lymphoma. Prophylactic intrathecal chemotherapy. EXAM: FLUOROSCOPICALLY GUIDED LUMBAR PUNCTURE FOR INTRATHECAL CHEMOTHERAPY FLUOROSCOPY TIME:  1 minutes 6 seconds PROCEDURE: Informed consent was obtained from the patient prior to the procedure, including potential complications of headache, allergy, and pain. With the patient prone, the lower back was prepped with Betadine. 1% Lidocaine was used for local anesthesia. Lumbar puncture was performed at the L5-S1 level using a gauge needle with return of L5-S1CSF. 15 mg of methotrexate was injected into the subarachnoid space. The patient tolerated the procedure well without apparent complication. IMPRESSION: Successful intrathecal administration of chemotherapy. First treatment. Electronically Signed   By: Suzy Bouchard M.D.   On: 04/25/2018 12:00    ASSESSMENT & PLAN:   78 y.o. male with  1. Primary Testicular Diffuse Large B-Cell Lymphoma, Stage 1E 11/30/16 NM Myocar Multi w/spect w/wall motion which revealed a LV EF of 44%  12/08/17 US Scrotum revealed Abnormal appearance of the left testicle which is larger than the right and diffusely heterogeneous in echotexture.  Cannot exclude infiltrating process/mass. Appearance is concerning for possible neoplasm.  01/05/18 Left testes biopsy revealed Primary Testicular Diffuse Large B-Cell Lymphoma   01/25/18 Hep B, Hep C and HIV negative  01/31/18 ECHO revealed a LV EF of 51%. Left ventricle: The cavity size was normal. Wall motion was normal; there were no  regional wall motion abnormalities. Atrial septum: No defect or patent foramen ovale was identified.  02/07/18 PET/CT revealed No findings for metabolically active lymphoma involving the neck, chest, abdomen/pelvis or osseous structures.  PLAN -Discussed pt labwork today, 05/17/18; blood counts and chemistries are stable -Due to admission after first intrathecal methotrexate, will hold IT MTX with this cycle. No further concerns for bleeding, nausea, or vomiting. -The pt has no prohibitive toxicities from continuing C3 R-mini-CHOP at this time. -Continue Senna S two tablets at night time, and Miralax -Continue with infection prevention strategies with the pt including crowd avoidance and wearing a mask while in public -Recommended that the pt continue to eat well, drink at least 48-64 oz of water each day, and walk 20-30 minutes each day.  -Previously recommended that the pt obtain a power of attorney -Will see the pt back in 3 weeks  2.  . Past Medical History:  Diagnosis Date  . Arthritis   . Coronary artery disease   . HOH (hard of hearing)   . Hyperlipidemia   . Hypertension   . Ischemic cardiomyopathy   . Myocardial infarction ([place]) 2016  . Quadriceps tendon rupture    LEFT  -continue f/u with PCP -given systolic dysfunction will rpt ECHO   Please schedule next 2 cycles of chemotherapy as ordered every 3 weeks RTC with Dr Irene Limbo with labs with Day1 of each cycle of treatment   All of the patients questions were answered with apparent satisfaction. The patient knows to call the clinic with any problems, questions or concerns.  The total time spent in the appt was 25 minutes and more than 50% was on counseling and direct patient cares.    Sullivan Lone MD MS AAHIVMS [facility] Hematology/Oncology Physician [facility]  (Office):       [phone] (Work cell):  [phone] (Fax):           [phone]  05/17/2018 9:35 AM  I, Baldwin [place], am acting as a  scribe for Dr. Sullivan Lone.   .I have reviewed the above documentation for accuracy and completeness, and I agree with the above. Brunetta Genera MD

## 2018-05-17 ENCOUNTER — Inpatient Hospital Stay: Payer: Medicare HMO | Attending: Hematology

## 2018-05-17 ENCOUNTER — Inpatient Hospital Stay (HOSPITAL_BASED_OUTPATIENT_CLINIC_OR_DEPARTMENT_OTHER): Payer: Medicare HMO | Admitting: Hematology

## 2018-05-17 ENCOUNTER — Inpatient Hospital Stay: Payer: Medicare HMO

## 2018-05-17 ENCOUNTER — Other Ambulatory Visit: Payer: Self-pay

## 2018-05-17 ENCOUNTER — Telehealth: Payer: Self-pay | Admitting: Hematology

## 2018-05-17 VITALS — BP 117/75 | HR 62 | Temp 97.5°F | Resp 18 | Ht 73.0 in | Wt 240.9 lb

## 2018-05-17 VITALS — BP 132/89 | HR 96 | Temp 97.8°F | Resp 18

## 2018-05-17 DIAGNOSIS — C8339 Diffuse large B-cell lymphoma, extranodal and solid organ sites: Secondary | ICD-10-CM

## 2018-05-17 DIAGNOSIS — Z5112 Encounter for antineoplastic immunotherapy: Secondary | ICD-10-CM | POA: Diagnosis not present

## 2018-05-17 DIAGNOSIS — Z7982 Long term (current) use of aspirin: Secondary | ICD-10-CM

## 2018-05-17 DIAGNOSIS — Z5111 Encounter for antineoplastic chemotherapy: Secondary | ICD-10-CM | POA: Diagnosis not present

## 2018-05-17 DIAGNOSIS — Z5189 Encounter for other specified aftercare: Secondary | ICD-10-CM | POA: Diagnosis not present

## 2018-05-17 DIAGNOSIS — Z95828 Presence of other vascular implants and grafts: Secondary | ICD-10-CM

## 2018-05-17 DIAGNOSIS — E785 Hyperlipidemia, unspecified: Secondary | ICD-10-CM | POA: Insufficient documentation

## 2018-05-17 DIAGNOSIS — Z79899 Other long term (current) drug therapy: Secondary | ICD-10-CM | POA: Insufficient documentation

## 2018-05-17 DIAGNOSIS — C8335 Diffuse large B-cell lymphoma, lymph nodes of inguinal region and lower limb: Secondary | ICD-10-CM | POA: Insufficient documentation

## 2018-05-17 LAB — CMP (CANCER CENTER ONLY)
ALT: 16 U/L (ref 0–44)
AST: 19 U/L (ref 15–41)
Albumin: 3.3 g/dL — ABNORMAL LOW (ref 3.5–5.0)
Alkaline Phosphatase: 62 U/L (ref 38–126)
Anion gap: 9 (ref 5–15)
BUN: 11 mg/dL (ref 8–23)
CO2: 25 mmol/L (ref 22–32)
Calcium: 8.6 mg/dL — ABNORMAL LOW (ref 8.9–10.3)
Chloride: 108 mmol/L (ref 98–111)
Creatinine: 1.09 mg/dL (ref 0.61–1.24)
GFR, Est AFR Am: >60 mL/min (ref >60)
GFR, Estimated: >60 mL/min (ref >60)
Glucose, Bld: 108 mg/dL — ABNORMAL HIGH (ref 70–99)
Potassium: 3.7 mmol/L (ref 3.5–5.1)
Sodium: 142 mmol/L (ref 135–145)
Total Bilirubin: 0.7 mg/dL (ref 0.3–1.2)
Total Protein: 6.2 g/dL — ABNORMAL LOW (ref 6.5–8.1)

## 2018-05-17 LAB — CBC WITH DIFFERENTIAL/PLATELET
Abs Immature Granulocytes: 0.01 10*3/uL (ref 0.00–0.07)
Basophils Absolute: 0.1 10*3/uL (ref 0.0–0.1)
Basophils Relative: 2 %
Eosinophils Absolute: 0.4 10*3/uL (ref 0.0–0.5)
Eosinophils Relative: 10 %
HCT: 37.3 % — ABNORMAL LOW (ref 39.0–52.0)
Hemoglobin: 11.9 g/dL — ABNORMAL LOW (ref 13.0–17.0)
Immature Granulocytes: 0 %
Lymphocytes Relative: 29 %
Lymphs Abs: 1.1 10*3/uL (ref 0.7–4.0)
MCH: 31.6 pg (ref 26.0–34.0)
MCHC: 31.9 g/dL (ref 30.0–36.0)
MCV: 99.2 fL (ref 80.0–100.0)
Monocytes Absolute: 0.6 10*3/uL (ref 0.1–1.0)
Monocytes Relative: 16 %
Neutro Abs: 1.7 10*3/uL (ref 1.7–7.7)
Neutrophils Relative %: 43 %
Platelets: 166 10*3/uL (ref 150–400)
RBC: 3.76 MIL/uL — ABNORMAL LOW (ref 4.22–5.81)
RDW: 12.5 % (ref 11.5–15.5)
WBC: 3.8 10*3/uL — ABNORMAL LOW (ref 4.0–10.5)
nRBC: 0 % (ref 0.0–0.2)

## 2018-05-17 MED ORDER — PALONOSETRON HCL INJECTION 0.25 MG/5ML
0.2500 mg | Freq: Once | INTRAVENOUS | Status: AC
  Administered 2018-05-17: 10:00:00 0.25 mg via INTRAVENOUS

## 2018-05-17 MED ORDER — DOXORUBICIN HCL CHEMO IV INJECTION 2 MG/ML
25.0000 mg/m2 | Freq: Once | INTRAVENOUS | Status: AC
  Administered 2018-05-17: 58 mg via INTRAVENOUS
  Filled 2018-05-17: qty 29

## 2018-05-17 MED ORDER — PALONOSETRON HCL INJECTION 0.25 MG/5ML
INTRAVENOUS | Status: AC
  Filled 2018-05-17: qty 5

## 2018-05-17 MED ORDER — SODIUM CHLORIDE 0.9 % IV SOLN
Freq: Once | INTRAVENOUS | Status: AC
  Administered 2018-05-17: 10:00:00 via INTRAVENOUS
  Filled 2018-05-17: qty 250

## 2018-05-17 MED ORDER — ACETAMINOPHEN 325 MG PO TABS
ORAL_TABLET | ORAL | Status: AC
  Filled 2018-05-17: qty 2

## 2018-05-17 MED ORDER — SODIUM CHLORIDE 0.9% FLUSH
10.0000 mL | INTRAVENOUS | Status: DC | PRN
  Administered 2018-05-17: 10 mL
  Filled 2018-05-17: qty 10

## 2018-05-17 MED ORDER — SODIUM CHLORIDE 0.9 % IV SOLN
Freq: Once | INTRAVENOUS | Status: AC
  Administered 2018-05-17: 11:00:00 via INTRAVENOUS
  Filled 2018-05-17: qty 5

## 2018-05-17 MED ORDER — VINCRISTINE SULFATE CHEMO INJECTION 1 MG/ML
1.0000 mg | Freq: Once | INTRAVENOUS | Status: AC
  Administered 2018-05-17: 1 mg via INTRAVENOUS
  Filled 2018-05-17: qty 1

## 2018-05-17 MED ORDER — SODIUM CHLORIDE 0.9 % IV SOLN
400.0000 mg/m2 | Freq: Once | INTRAVENOUS | Status: AC
  Administered 2018-05-17: 940 mg via INTRAVENOUS
  Filled 2018-05-17: qty 47

## 2018-05-17 MED ORDER — DIPHENHYDRAMINE HCL 25 MG PO CAPS
ORAL_CAPSULE | ORAL | Status: AC
  Filled 2018-05-17: qty 2

## 2018-05-17 MED ORDER — DIPHENHYDRAMINE HCL 25 MG PO CAPS
50.0000 mg | ORAL_CAPSULE | Freq: Once | ORAL | Status: AC
  Administered 2018-05-17: 50 mg via ORAL

## 2018-05-17 MED ORDER — ACETAMINOPHEN 325 MG PO TABS
650.0000 mg | ORAL_TABLET | Freq: Once | ORAL | Status: AC
  Administered 2018-05-17: 650 mg via ORAL

## 2018-05-17 MED ORDER — HEPARIN SOD (PORK) LOCK FLUSH 100 UNIT/ML IV SOLN
500.0000 [IU] | Freq: Once | INTRAVENOUS | Status: AC | PRN
  Administered 2018-05-17: 500 [IU]
  Filled 2018-05-17: qty 5

## 2018-05-17 MED ORDER — SODIUM CHLORIDE 0.9 % IV SOLN
375.0000 mg/m2 | Freq: Once | INTRAVENOUS | Status: AC
  Administered 2018-05-17: 900 mg via INTRAVENOUS
  Filled 2018-05-17: qty 40

## 2018-05-17 NOTE — Progress Notes (Signed)
Excellent blood return before ,during and after adriamycin.

## 2018-05-17 NOTE — Patient Instructions (Signed)
[facility] Discharge Instructions for Patients Receiving Chemotherapy  Today you received the following chemotherapy agents: Adriamycin (Doxorubicin), Vincristine (Oncovin), Cyclophosphamide (Cytoxan), Rituximab (Rituxan)  To help prevent nausea and vomiting after your treatment, we encourage you to take your nausea medication as directed. Received Aloxi during treatment today-->Take your Compazine prescription (not Zofran) for the next 3 days as needed.   If you develop nausea and vomiting that is not controlled by your nausea medication, call the clinic.   BELOW ARE SYMPTOMS THAT SHOULD BE REPORTED IMMEDIATELY:  *FEVER GREATER THAN 100.5 F  *CHILLS WITH OR WITHOUT FEVER  NAUSEA AND VOMITING THAT IS NOT CONTROLLED WITH YOUR NAUSEA MEDICATION  *UNUSUAL SHORTNESS OF BREATH  *UNUSUAL BRUISING OR BLEEDING  TENDERNESS IN MOUTH AND THROAT WITH OR WITHOUT PRESENCE OF ULCERS  *URINARY PROBLEMS  *BOWEL PROBLEMS  UNUSUAL RASH Items with * indicate a potential emergency and should be followed up as soon as possible.  Feel free to call the clinic should you have any questions or concerns. The clinic phone number is [phone].  Please show the [place] at check-in to the Emergency Department and triage nurse.  Doxorubicin injection What is this medicine? DOXORUBICIN (dox oh ROO bi sin) is a chemotherapy drug. It is used to treat many kinds of cancer like leukemia, lymphoma, neuroblastoma, sarcoma, and Wilms' tumor. It is also used to treat bladder cancer, breast cancer, lung cancer, ovarian cancer, stomach cancer, and thyroid cancer. This medicine may be used for other purposes; ask your health care provider or pharmacist if you have questions. COMMON BRAND NAME(S): Adriamycin, Adriamycin PFS, Adriamycin RDF, Rubex What should I tell my health care provider before I take this medicine? They need to know if you have any of these conditions: -heart  disease -history of low blood counts caused by a medicine -liver disease -recent or ongoing radiation therapy -an unusual or allergic reaction to doxorubicin, other chemotherapy agents, other medicines, foods, dyes, or preservatives -pregnant or trying to get pregnant -breast-feeding How should I use this medicine? This drug is given as an infusion into a vein. It is administered in a hospital or clinic by a specially trained health care professional. If you have pain, swelling, burning or any unusual feeling around the site of your injection, tell your health care professional right away. Talk to your pediatrician regarding the use of this medicine in children. Special care may be needed. Overdosage: If you think you have taken too much of this medicine contact a poison control center or emergency room at once. NOTE: This medicine is only for you. Do not share this medicine with others. What if I miss a dose? It is important not to miss your dose. Call your doctor or health care professional if you are unable to keep an appointment. What may interact with this medicine? This medicine may interact with the following medications: -6-mercaptopurine -paclitaxel -phenytoin -St. John's Wort -trastuzumab -verapamil This list may not describe all possible interactions. Give your health care provider a list of all the medicines, herbs, non-prescription drugs, or dietary supplements you use. Also tell them if you smoke, drink alcohol, or use illegal drugs. Some items may interact with your medicine. What should I watch for while using this medicine? This drug may make you feel generally unwell. This is not uncommon, as chemotherapy can affect healthy cells as well as cancer cells. Report any side effects. Continue your course of treatment even though you feel ill unless your doctor  tells you to stop. There is a maximum amount of this medicine you should receive throughout your life. The amount  depends on the medical condition being treated and your overall health. Your doctor will watch how much of this medicine you receive in your lifetime. Tell your doctor if you have taken this medicine before. You may need blood work done while you are taking this medicine. Your urine may turn red for a few days after your dose. This is not blood. If your urine is dark or brown, call your doctor. In some cases, you may be given additional medicines to help with side effects. Follow all directions for their use. Call your doctor or health care professional for advice if you get a fever, chills or sore throat, or other symptoms of a cold or flu. Do not treat yourself. This drug decreases your body's ability to fight infections. Try to avoid being around people who are sick. This medicine may increase your risk to bruise or bleed. Call your doctor or health care professional if you notice any unusual bleeding. Talk to your doctor about your risk of cancer. You may be more at risk for certain types of cancers if you take this medicine. Do not become pregnant while taking this medicine or for 6 months after stopping it. Women should inform their doctor if they wish to become pregnant or think they might be pregnant. Men should not father a child while taking this medicine and for 6 months after stopping it. There is a potential for serious side effects to an unborn child. Talk to your health care professional or pharmacist for more information. Do not breast-feed an infant while taking this medicine. This medicine has caused ovarian failure in some women and reduced sperm counts in some men This medicine may interfere with the ability to have a child. Talk with your doctor or health care professional if you are concerned about your fertility. This medicine may cause a decrease in Co-Enzyme Q-10. You should make sure that you get enough Co-Enzyme Q-10 while you are taking this medicine. Discuss the foods you eat  and the vitamins you take with your health care professional. What side effects may I notice from receiving this medicine? Side effects that you should report to your doctor or health care professional as soon as possible: -allergic reactions like skin rash, itching or hives, swelling of the face, lips, or tongue -breathing problems -chest pain -fast or irregular heartbeat -low blood counts - this medicine may decrease the number of white blood cells, red blood cells and platelets. You may be at increased risk for infections and bleeding. -pain, redness, or irritation at site where injected -signs of infection - fever or chills, cough, sore throat, pain or difficulty passing urine -signs of decreased platelets or bleeding - bruising, pinpoint red spots on the skin, black, tarry stools, blood in the urine -swelling of the ankles, feet, hands -tiredness -weakness Side effects that usually do not require medical attention (report to your doctor or health care professional if they continue or are bothersome): -diarrhea -hair loss -mouth sores -nail discoloration or damage -nausea -red colored urine -vomiting This list may not describe all possible side effects. Call your doctor for medical advice about side effects. You may report side effects to FDA at 1-800-FDA-1088. Where should I keep my medicine? This drug is given in a hospital or clinic and will not be stored at home. NOTE: This sheet is a summary. It may not  cover all possible information. If you have questions about this medicine, talk to your doctor, pharmacist, or health care provider.  2019 Elsevier/Gold Standard (2016-09-09 11:01:26)  Vincristine injection What is this medicine? VINCRISTINE (vin KRIS teen) is a chemotherapy drug. It slows the growth of cancer cells. This medicine is used to treat many types of cancer like Hodgkin's disease, leukemia, non-Hodgkin's lymphoma, neuroblastoma (brain cancer), rhabdomyosarcoma, and  Wilms' tumor. This medicine may be used for other purposes; ask your health care provider or pharmacist if you have questions. COMMON BRAND NAME(S): Oncovin, Vincasar PFS What should I tell my health care provider before I take this medicine? They need to know if you have any of these conditions: -blood disorders -gout -infection (especially chickenpox, cold sores, or herpes) -kidney disease -liver disease -lung disease -nervous system disease like Charcot-Marie-Tooth (CMT) -recent or ongoing radiation therapy -an unusual or allergic reaction to vincristine, other chemotherapy agents, other medicines, foods, dyes, or preservatives -pregnant or trying to get pregnant -breast-feeding How should I use this medicine? This drug is given as an infusion into a vein. It is administered in a hospital or clinic by a specially trained health care professional. If you have pain, swelling, burning, or any unusual feeling around the site of your injection, tell your health care professional right away. Talk to your pediatrician regarding the use of this medicine in children. While this drug may be prescribed for selected conditions, precautions do apply. Overdosage: If you think you have taken too much of this medicine contact a poison control center or emergency room at once. NOTE: This medicine is only for you. Do not share this medicine with others. What if I miss a dose? It is important not to miss your dose. Call your doctor or health care professional if you are unable to keep an appointment. What may interact with this medicine? Do not take this medicine with any of the following medications: -itraconazole -mibefradil -voriconazole This medicine may also interact with the following medications: -cyclosporine -erythromycin -fluconazole -ketoconazole -medicines for HIV like delavirdine, efavirenz, nevirapine -medicines for seizures like ethotoin, fosphenotoin, phenytoin -medicines to  increase blood counts like filgrastim, pegfilgrastim, sargramostim -other chemotherapy drugs like cisplatin, L-asparaginase, methotrexate, mitomycin, paclitaxel -pegaspargase -vaccines -zalcitabine, ddC Talk to your doctor or health care professional before taking any of these medicines: -acetaminophen -aspirin -ibuprofen -ketoprofen -naproxen This list may not describe all possible interactions. Give your health care provider a list of all the medicines, herbs, non-prescription drugs, or dietary supplements you use. Also tell them if you smoke, drink alcohol, or use illegal drugs. Some items may interact with your medicine. What should I watch for while using this medicine? Your condition will be monitored carefully while you are receiving this medicine. You will need important blood work done while you are taking this medicine. This drug may make you feel generally unwell. This is not uncommon, as chemotherapy can affect healthy cells as well as cancer cells. Report any side effects. Continue your course of treatment even though you feel ill unless your doctor tells you to stop. In some cases, you may be given additional medicines to help with side effects. Follow all directions for their use. Call your doctor or health care professional for advice if you get a fever, chills or sore throat, or other symptoms of a cold or flu. Do not treat yourself. Avoid taking products that contain aspirin, acetaminophen, ibuprofen, naproxen, or ketoprofen unless instructed by your doctor. These medicines may hide a fever. Do  not become pregnant while taking this medicine. Women should inform their doctor if they wish to become pregnant or think they might be pregnant. There is a potential for serious side effects to an unborn child. Talk to your health care professional or pharmacist for more information. Do not breast-feed an infant while taking this medicine. Men may have a lower sperm count while taking  this medicine. Talk to your doctor if you plan to father a child. What side effects may I notice from receiving this medicine? Side effects that you should report to your doctor or health care professional as soon as possible: -allergic reactions like skin rash, itching or hives, swelling of the face, lips, or tongue -breathing problems -confusion or changes in emotions or moods -constipation -cough -mouth sores -muscle weakness -nausea and vomiting -pain, swelling, redness or irritation at the injection site -pain, tingling, numbness in the hands or feet -problems with balance, talking, walking -seizures -stomach pain -trouble passing urine or change in the amount of urine Side effects that usually do not require medical attention (report to your doctor or health care professional if they continue or are bothersome): -diarrhea -hair loss -jaw pain -loss of appetite This list may not describe all possible side effects. Call your doctor for medical advice about side effects. You may report side effects to FDA at 1-800-FDA-1088. Where should I keep my medicine? This drug is given in a hospital or clinic and will not be stored at home. NOTE: This sheet is a summary. It may not cover all possible information. If you have questions about this medicine, talk to your doctor, pharmacist, or health care provider.  2019 Elsevier/Gold Standard (2007-10-24 17:17:13)  Cyclophosphamide injection What is this medicine? CYCLOPHOSPHAMIDE (sye kloe FOSS fa mide) is a chemotherapy drug. It slows the growth of cancer cells. This medicine is used to treat many types of cancer like lymphoma, myeloma, leukemia, breast cancer, and ovarian cancer, to name a few. This medicine may be used for other purposes; ask your health care provider or pharmacist if you have questions. COMMON BRAND NAME(S): Cytoxan, Neosar What should I tell my health care provider before I take this medicine? They need to know if you  have any of these conditions: -blood disorders -history of other chemotherapy -infection -kidney disease -liver disease -recent or ongoing radiation therapy -tumors in the bone marrow -an unusual or allergic reaction to cyclophosphamide, other chemotherapy, other medicines, foods, dyes, or preservatives -pregnant or trying to get pregnant -breast-feeding How should I use this medicine? This drug is usually given as an injection into a vein or muscle or by infusion into a vein. It is administered in a hospital or clinic by a specially trained health care professional. Talk to your pediatrician regarding the use of this medicine in children. Special care may be needed. Overdosage: If you think you have taken too much of this medicine contact a poison control center or emergency room at once. NOTE: This medicine is only for you. Do not share this medicine with others. What if I miss a dose? It is important not to miss your dose. Call your doctor or health care professional if you are unable to keep an appointment. What may interact with this medicine? This medicine may interact with the following medications: -amiodarone -amphotericin B -azathioprine -certain antiviral medicines for HIV or AIDS such as protease inhibitors (e.g., indinavir, ritonavir) and zidovudine -certain blood pressure medications such as benazepril, captopril, enalapril, fosinopril, lisinopril, moexipril, monopril, perindopril, quinapril, ramipril,  trandolapril -certain cancer medications such as anthracyclines (e.g., daunorubicin, doxorubicin), busulfan, cytarabine, paclitaxel, pentostatin, tamoxifen, trastuzumab -certain diuretics such as chlorothiazide, chlorthalidone, hydrochlorothiazide, indapamide, metolazone -certain medicines that treat or prevent blood clots like warfarin -certain muscle relaxants such as succinylcholine -cyclosporine -etanercept -indomethacin -medicines to increase blood counts like  filgrastim, pegfilgrastim, sargramostim -medicines used as general anesthesia -metronidazole -natalizumab This list may not describe all possible interactions. Give your health care provider a list of all the medicines, herbs, non-prescription drugs, or dietary supplements you use. Also tell them if you smoke, drink alcohol, or use illegal drugs. Some items may interact with your medicine. What should I watch for while using this medicine? Visit your doctor for checks on your progress. This drug may make you feel generally unwell. This is not uncommon, as chemotherapy can affect healthy cells as well as cancer cells. Report any side effects. Continue your course of treatment even though you feel ill unless your doctor tells you to stop. Drink water or other fluids as directed. Urinate often, even at night. In some cases, you may be given additional medicines to help with side effects. Follow all directions for their use. Call your doctor or health care professional for advice if you get a fever, chills or sore throat, or other symptoms of a cold or flu. Do not treat yourself. This drug decreases your body's ability to fight infections. Try to avoid being around people who are sick. This medicine may increase your risk to bruise or bleed. Call your doctor or health care professional if you notice any unusual bleeding. Be careful brushing and flossing your teeth or using a toothpick because you may get an infection or bleed more easily. If you have any dental work done, tell your dentist you are receiving this medicine. You may get drowsy or dizzy. Do not drive, use machinery, or do anything that needs mental alertness until you know how this medicine affects you. Do not become pregnant while taking this medicine or for 1 year after stopping it. Women should inform their doctor if they wish to become pregnant or think they might be pregnant. Men should not father a child while taking this medicine and for  4 months after stopping it. There is a potential for serious side effects to an unborn child. Talk to your health care professional or pharmacist for more information. Do not breast-feed an infant while taking this medicine. This medicine may interfere with the ability to have a child. This medicine has caused ovarian failure in some women. This medicine has caused reduced sperm counts in some men. You should talk with your doctor or health care professional if you are concerned about your fertility. If you are going to have surgery, tell your doctor or health care professional that you have taken this medicine. What side effects may I notice from receiving this medicine? Side effects that you should report to your doctor or health care professional as soon as possible: -allergic reactions like skin rash, itching or hives, swelling of the face, lips, or tongue -low blood counts - this medicine may decrease the number of white blood cells, red blood cells and platelets. You may be at increased risk for infections and bleeding. -signs of infection - fever or chills, cough, sore throat, pain or difficulty passing urine -signs of decreased platelets or bleeding - bruising, pinpoint red spots on the skin, black, tarry stools, blood in the urine -signs of decreased red blood cells - unusually weak or tired,  fainting spells, lightheadedness -breathing problems -dark urine -dizziness -palpitations -swelling of the ankles, feet, hands -trouble passing urine or change in the amount of urine -weight gain -yellowing of the eyes or skin Side effects that usually do not require medical attention (report to your doctor or health care professional if they continue or are bothersome): -changes in nail or skin color -hair loss -missed menstrual periods -mouth sores -nausea, vomiting This list may not describe all possible side effects. Call your doctor for medical advice about side effects. You may report side  effects to FDA at 1-800-FDA-1088. Where should I keep my medicine? This drug is given in a hospital or clinic and will not be stored at home. NOTE: This sheet is a summary. It may not cover all possible information. If you have questions about this medicine, talk to your doctor, pharmacist, or health care provider.  2019 Elsevier/Gold Standard (2011-12-11 16:22:58)  Rituximab injection What is this medicine? RITUXIMAB (ri TUX i mab) is a monoclonal antibody. It is used to treat certain types of cancer like non-Hodgkin lymphoma and chronic lymphocytic leukemia. It is also used to treat rheumatoid arthritis, granulomatosis with polyangiitis (or Wegener's granulomatosis), microscopic polyangiitis, and pemphigus vulgaris. This medicine may be used for other purposes; ask your health care provider or pharmacist if you have questions. COMMON BRAND NAME(S): Rituxan What should I tell my health care provider before I take this medicine? They need to know if you have any of these conditions: -heart disease -infection (especially a virus infection such as hepatitis B, chickenpox, cold sores, or herpes) -immune system problems -irregular heartbeat -kidney disease -low blood counts, like low white cell, platelet, or red cell counts -lung or breathing disease, like asthma -recently received or scheduled to receive a vaccine -an unusual or allergic reaction to rituximab, other medicines, foods, dyes, or preservatives -pregnant or trying to get pregnant -breast-feeding How should I use this medicine? This medicine is for infusion into a vein. It is administered in a hospital or clinic by a specially trained health care professional. A special MedGuide will be given to you by the pharmacist with each prescription and refill. Be sure to read this information carefully each time. Talk to your pediatrician regarding the use of this medicine in children. This medicine is not approved for use in  children. Overdosage: If you think you have taken too much of this medicine contact a poison control center or emergency room at once. NOTE: This medicine is only for you. Do not share this medicine with others. What if I miss a dose? It is important not to miss a dose. Call your doctor or health care professional if you are unable to keep an appointment. What may interact with this medicine? -cisplatin -live virus vaccines This list may not describe all possible interactions. Give your health care provider a list of all the medicines, herbs, non-prescription drugs, or dietary supplements you use. Also tell them if you smoke, drink alcohol, or use illegal drugs. Some items may interact with your medicine. What should I watch for while using this medicine? Your condition will be monitored carefully while you are receiving this medicine. You may need blood work done while you are taking this medicine. This medicine can cause serious allergic reactions. To reduce your risk you may need to take medicine before treatment with this medicine. Take your medicine as directed. In some patients, this medicine may cause a serious brain infection that may cause death. If you have any problems  seeing, thinking, speaking, walking, or standing, tell your healthcare professional right away. If you cannot reach your healthcare professional, urgently seek other source of medical care. Call your doctor or health care professional for advice if you get a fever, chills or sore throat, or other symptoms of a cold or flu. Do not treat yourself. This drug decreases your body's ability to fight infections. Try to avoid being around people who are sick. Do not become pregnant while taking this medicine or for 12 months after stopping it. Women should inform their doctor if they wish to become pregnant or think they might be pregnant. There is a potential for serious side effects to an unborn child. Talk to your health care  professional or pharmacist for more information. Do not breast-feed an infant while taking this medicine or for 6 months after stopping it. What side effects may I notice from receiving this medicine? Side effects that you should report to your doctor or health care professional as soon as possible: -allergic reactions like skin rash, itching or hives; swelling of the face, lips, or tongue -breathing problems -chest pain -changes in vision -diarrhea -headache with fever, neck stiffness, sensitivity to light, nausea, or confusion -fast, irregular heartbeat -loss of memory -low blood counts - this medicine may decrease the number of white blood cells, red blood cells and platelets. You may be at increased risk for infections and bleeding. -mouth sores -problems with balance, talking, or walking -redness, blistering, peeling or loosening of the skin, including inside the mouth -signs of infection - fever or chills, cough, sore throat, pain or difficulty passing urine -signs and symptoms of kidney injury like trouble passing urine or change in the amount of urine -signs and symptoms of liver injury like dark yellow or brown urine; general ill feeling or flu-like symptoms; light-colored stools; loss of appetite; nausea; right upper belly pain; unusually weak or tired; yellowing of the eyes or skin -signs and symptoms of low blood pressure like dizziness; feeling faint or lightheaded, falls; unusually weak or tired -stomach pain -swelling of the ankles, feet, hands -unusual bleeding or bruising -vomiting Side effects that usually do not require medical attention (report to your doctor or health care professional if they continue or are bothersome): -headache -joint pain -muscle cramps or muscle pain -nausea -tiredness This list may not describe all possible side effects. Call your doctor for medical advice about side effects. You may report side effects to FDA at 1-800-FDA-1088. Where should  I keep my medicine? This drug is given in a hospital or clinic and will not be stored at home. NOTE: This sheet is a summary. It may not cover all possible information. If you have questions about this medicine, talk to your doctor, pharmacist, or health care provider.  2019 Elsevier/Gold Standard (2017-01-08 13:04:32)

## 2018-05-17 NOTE — Telephone Encounter (Signed)
Scheduled appt per 4/7 los. 

## 2018-05-19 ENCOUNTER — Inpatient Hospital Stay: Payer: Medicare HMO

## 2018-05-19 ENCOUNTER — Other Ambulatory Visit: Payer: Self-pay

## 2018-05-19 DIAGNOSIS — Z7982 Long term (current) use of aspirin: Secondary | ICD-10-CM | POA: Diagnosis not present

## 2018-05-19 DIAGNOSIS — Z5189 Encounter for other specified aftercare: Secondary | ICD-10-CM | POA: Diagnosis not present

## 2018-05-19 DIAGNOSIS — Z79899 Other long term (current) drug therapy: Secondary | ICD-10-CM | POA: Diagnosis not present

## 2018-05-19 DIAGNOSIS — C8339 Diffuse large B-cell lymphoma, extranodal and solid organ sites: Secondary | ICD-10-CM

## 2018-05-19 DIAGNOSIS — Z5112 Encounter for antineoplastic immunotherapy: Secondary | ICD-10-CM | POA: Diagnosis not present

## 2018-05-19 DIAGNOSIS — E785 Hyperlipidemia, unspecified: Secondary | ICD-10-CM | POA: Diagnosis not present

## 2018-05-19 DIAGNOSIS — Z5111 Encounter for antineoplastic chemotherapy: Secondary | ICD-10-CM | POA: Diagnosis not present

## 2018-05-19 DIAGNOSIS — C8335 Diffuse large B-cell lymphoma, lymph nodes of inguinal region and lower limb: Secondary | ICD-10-CM | POA: Diagnosis not present

## 2018-05-19 MED ORDER — PEGFILGRASTIM-CBQV 6 MG/0.6ML ~~LOC~~ SOSY
6.0000 mg | PREFILLED_SYRINGE | Freq: Once | SUBCUTANEOUS | Status: AC
  Administered 2018-05-19: 6 mg via SUBCUTANEOUS

## 2018-05-19 MED ORDER — PEGFILGRASTIM-CBQV 6 MG/0.6ML ~~LOC~~ SOSY
PREFILLED_SYRINGE | SUBCUTANEOUS | Status: AC
  Filled 2018-05-19: qty 0.6

## 2018-05-19 NOTE — Patient Instructions (Signed)
Pegfilgrastim injection  What is this medicine?  PEGFILGRASTIM (PEG fil gra stim) is a long-acting granulocyte colony-stimulating factor that stimulates the growth of neutrophils, a type of white blood cell important in the body's fight against infection. It is used to reduce the incidence of fever and infection in patients with certain types of cancer who are receiving chemotherapy that affects the bone marrow, and to increase survival after being exposed to high doses of radiation.  This medicine may be used for other purposes; ask your health care provider or pharmacist if you have questions.  COMMON BRAND NAME(S): Fulphila, Neulasta, UDENYCA  What should I tell my health care provider before I take this medicine?  They need to know if you have any of these conditions:  -kidney disease  -latex allergy  -ongoing radiation therapy  -sickle cell disease  -skin reactions to acrylic adhesives (On-Body Injector only)  -an unusual or allergic reaction to pegfilgrastim, filgrastim, other medicines, foods, dyes, or preservatives  -pregnant or trying to get pregnant  -breast-feeding  How should I use this medicine?  This medicine is for injection under the skin. If you get this medicine at home, you will be taught how to prepare and give the pre-filled syringe or how to use the On-body Injector. Refer to the patient Instructions for Use for detailed instructions. Use exactly as directed. Tell your healthcare provider immediately if you suspect that the On-body Injector may not have performed as intended or if you suspect the use of the On-body Injector resulted in a missed or partial dose.  It is important that you put your used needles and syringes in a special sharps container. Do not put them in a trash can. If you do not have a sharps container, call your pharmacist or healthcare provider to get one.  Talk to your pediatrician regarding the use of this medicine in children. While this drug may be prescribed for  selected conditions, precautions do apply.  Overdosage: If you think you have taken too much of this medicine contact a poison control center or emergency room at once.  NOTE: This medicine is only for you. Do not share this medicine with others.  What if I miss a dose?  It is important not to miss your dose. Call your doctor or health care professional if you miss your dose. If you miss a dose due to an On-body Injector failure or leakage, a new dose should be administered as soon as possible using a single prefilled syringe for manual use.  What may interact with this medicine?  Interactions have not been studied.  Give your health care provider a list of all the medicines, herbs, non-prescription drugs, or dietary supplements you use. Also tell them if you smoke, drink alcohol, or use illegal drugs. Some items may interact with your medicine.  This list may not describe all possible interactions. Give your health care provider a list of all the medicines, herbs, non-prescription drugs, or dietary supplements you use. Also tell them if you smoke, drink alcohol, or use illegal drugs. Some items may interact with your medicine.  What should I watch for while using this medicine?  You may need blood work done while you are taking this medicine.  If you are going to need a MRI, CT scan, or other procedure, tell your doctor that you are using this medicine (On-Body Injector only).  What side effects may I notice from receiving this medicine?  Side effects that you should report to   your doctor or health care professional as soon as possible:  -allergic reactions like skin rash, itching or hives, swelling of the face, lips, or tongue  -back pain  -dizziness  -fever  -pain, redness, or irritation at site where injected  -pinpoint red spots on the skin  -red or dark-brown urine  -shortness of breath or breathing problems  -stomach or side pain, or pain at the shoulder  -swelling  -tiredness  -trouble passing urine or  change in the amount of urine  Side effects that usually do not require medical attention (report to your doctor or health care professional if they continue or are bothersome):  -bone pain  -muscle pain  This list may not describe all possible side effects. Call your doctor for medical advice about side effects. You may report side effects to FDA at 1-800-FDA-1088.  Where should I keep my medicine?  Keep out of the reach of children.  If you are using this medicine at home, you will be instructed on how to store it. Throw away any unused medicine after the expiration date on the label.  NOTE: This sheet is a summary. It may not cover all possible information. If you have questions about this medicine, talk to your doctor, pharmacist, or health care provider.   2019 Elsevier/Gold Standard (2017-05-03 16:57:08)

## 2018-06-02 ENCOUNTER — Telehealth: Payer: Self-pay | Admitting: *Deleted

## 2018-06-02 NOTE — Telephone Encounter (Signed)
Neulasta Onpro Patient Outreach Note  Patient was contacted on 06/01/2018 in regards to switching G-CSF therapy to Neulasta Onpro ([email or web address]). Patient was educated on the purpose of this proposed change in therapy due to COVID-19 pandemic. Patient was educated about Neulasta Onpro on-body injector and patient will be provided with an educational video while in infusion on their next scheduled date if changed to Neulasta Onpro.   [x]  Patient agrees to change in therapy. Begin process to change to Neulasta Onpro therapy.  []  Patient does not agree to change in therapy. No change to Neulasta Onpro at this time.    Thank You,  Caprice Renshaw  06/02/2018 3:20 PM

## 2018-06-06 NOTE — Progress Notes (Signed)
HEMATOLOGY/ONCOLOGY CLINIC NOTE  Date of Service: 06/07/2018  Patient Care Team: [name], MD as PCP - General (Family Medicine)  CHIEF COMPLAINTS/PURPOSE OF CONSULTATION:  Primary testicular diffuse Large B-Cell Lymphoma   HISTORY OF PRESENTING ILLNESS:   Carl [name] is a wonderful 78 y.o. male who has been referred to [place] by Dr. [name] for evaluation and management of Diffuse Large B-Cell Lymphoma. The pt reports that he is doing well overall.  The pt notes that he first began feeling differently about 4-5 months ago, when he noticed left testicular swelling. He notes that the his testicle was "a little bit larger than a golf ball," and that this was not painful. The pt notes that he has not had any abdominal distension or abdominal pain. The pt denies any fevers, chills, night sweats, or unexpected weight loss. He also denies any leg swelling. The pt denies any trauma sustained by the testicles. He denies noticing any other related symptoms.   The pt reports that he believed he had food poisoning prior to this, as he had lots of diarrhea. He notes that he has observed a small knot on the back of his right wrist, which has not changed over the last year.  The pt has two stents in his heart which were placed in 2016. The pt notes that his blood pressure has been stable and well-controlled. He notes that his last ECHO was 6 months ago, and he has not had any CP or SOB since that time. He takes 81mg  aspirin daily. He also takes Atorvastatin. The pt denies feeling limited in his walking by SOB or CP. He notes that he could walk 2 miles before ceasing to walk, noting that feeling tired in his muscles would limit him.   The pt has recently pursued surgery on his throat with ENT Dr. Melida Quitter for altered voice and hoarseness related to his vocal cords.   The pt notes that he currently lives by himself, and is not married. He has a daughter who is 45 years old. He  notes that his daughter "tries to help out." He notes that he does not have a power of attorney, but that this would be his daughter.   Of note prior to the patient's visit today, pt has had a Left Testis Surgical biopsy completed on 01/05/18 with results revealing Diffuse Large B-Cell Lymphoma.   The pt also had an US Scrotum on 12/08/17 which revealed Abnormal appearance of the left testicle which is larger than the right and diffusely heterogeneous in echotexture. Cannot exclude infiltrating process/mass. Appearance is concerning for possible neoplasm.  Most recent lab results (11/26/17) of BMP revealed all values WNL except for Glucose at 104, Creatinine at 1.30, Calcium at 8.7, GFR at 59 11/26/17 Hemoglobin value at 13.6.   On review of systems, pt reports stable energy levels, left testicular swelling, and denies fevers, chills, night sweats, unexpected weight loss, CP, SOB, back pains, flank pains, abdominal pains, leg swelling, testicular pain, and any other symptoms.   On PMHx the pt reports CAD, two heart stents placed in 2016. On Social Hx the pt denies smoking.  On Family Hx the pt reports mother with lymphoma of the breast. Two sisters with breast cancer, one at age 43.  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 The patient's last visit with [place] was on 05/17/18. The  pt reports that he is doing well overall.  The pt reports that he has not developed any new concerns in the interim. He denies any fevers, chills, nausea, vomiting, concerns for infections or testicular pain or swelling.   The pt notes that his big toes have been a little numb, but notes that this has been the case for the past 3 years and denies this changing any. He notes improvement with massages.   The pt notes that he has been staying very active and has been walking daily and endorses good energy  levels.  Lab results today (06/07/18) of CBC w/diff and CMP is as follows: all values are WNL except for Glucose at 101.  On review of systems, pt reports good energy levels, staying active, eating well, and denies fevers, chills, nausea, bowel abnormalities, blood in the stools, black stools, back pains, headaches, leg swelling, concerns for infections, testicular pain or swelling, mouth sores, nausea, vomiting, dental pain, leg swelling, and any other symptoms.   MEDICAL HISTORY:  Past Medical History:  Diagnosis Date  . Arthritis   . Coronary artery disease   . HOH (hard of hearing)   . Hyperlipidemia   . Hypertension   . Ischemic cardiomyopathy   . Myocardial infarction ([place]) 2016  . Quadriceps tendon rupture    LEFT    SURGICAL HISTORY: Past Surgical History:  Procedure Laterality Date  . BIOPSY  04/27/2018   Procedure: BIOPSY;  Surgeon: Ronnette Juniper, MD;  Location: [facility];  Service: Gastroenterology;;  . CARDIAC CATHETERIZATION N/A 10/15/2014   Procedure: Left Heart Cath and Coronary Angiography;  Surgeon: Charolette Forward, MD;  Location: [facility];  Service: Cardiovascular;  Laterality: N/A;  . COLONOSCOPY    . ESOPHAGOGASTRODUODENOSCOPY (EGD) WITH PROPOFOL N/A 04/27/2018   Procedure: ESOPHAGOGASTRODUODENOSCOPY (EGD) WITH PROPOFOL;  Surgeon: Ronnette Juniper, MD;  Location: [facility];  Service: Gastroenterology;  Laterality: N/A;  . IR IMAGING GUIDED PORT INSERTION  03/14/2018  . MICROLARYNGOSCOPY WITH CO2 LASER AND EXCISION OF VOCAL CORD LESION N/A 11/26/2017   Procedure: MICROLARYNGOSCOPY WITH CO2 LASER AND EXCISION OF VOCAL CORD LESION WITH JET VENTILATION;  Surgeon: Melida Quitter, MD;  Location: [facility];  Service: ENT;  Laterality: N/A;  . QUADRICEPS TENDON REPAIR Left 02/20/2014   Procedure: LEFT REPAIR QUADRICEP TENDON;  Surgeon: Mauri Pole, MD;  Location: [facility];  Service: Orthopedics;  Laterality: Left;  . THROAT SURGERY  2010    SOCIAL HISTORY: Social  History   Socioeconomic History  . Marital status: Divorced    Spouse name: Not on file  . Number of children: Not on file  . Years of education: Not on file  . Highest education level: Not on file  Occupational History  . Not on file  Social Needs  . Financial resource strain: Not on file  . Food insecurity:    Worry: Not on file    Inability: Not on file  . Transportation needs:    Medical: Not on file    Non-medical: Not on file  Tobacco Use  . Smoking status: Never Smoker  . Smokeless tobacco: Never Used  Substance and Sexual Activity  . Alcohol use: Not Currently  . Drug use: No  . Sexual activity: Not on file  Lifestyle  . Physical activity:    Days per week: Not on file    Minutes per session: Not on file  . Stress: Not on file  Relationships  . Social connections:    Talks on  phone: Not on file    Gets together: Not on file    Attends religious service: Not on file    Active member of club or organization: Not on file    Attends meetings of clubs or organizations: Not on file    Relationship status: Not on file  . Intimate partner violence:    Fear of current or ex partner: Not on file    Emotionally abused: Not on file    Physically abused: Not on file    Forced sexual activity: Not on file  Other Topics Concern  . Not on file  Social History Narrative  . Not on file    FAMILY HISTORY: No family history on file.  ALLERGIES:  has No Known Allergies.  MEDICATIONS:  Current Outpatient Medications  Medication Sig Dispense Refill  . aspirin EC 81 MG tablet Take 81 mg by mouth daily.    Marland Kitchen atorvastatin (LIPITOR) 40 MG tablet Take 1 tablet (40 mg total) by mouth daily at 6 PM. 60 tablet 3  . carvedilol (COREG) 6.25 MG tablet Take 6.25 mg by mouth 2 (two) times daily with a meal.    . cholecalciferol (VITAMIN D) 1000 UNITS tablet Take 1,000 Units by mouth daily.    Marland Kitchen losartan (COZAAR) 25 MG tablet Take 25 mg by mouth every evening.    . Multiple Vitamin  (MULTIVITAMIN WITH MINERALS) TABS tablet Take 1 tablet by mouth daily.    . nitroGLYCERIN (NITROSTAT) 0.4 MG SL tablet Place 1 tablet (0.4 mg total) under the tongue every 5 (five) minutes x 3 doses as needed for chest pain. 25 tablet 12  . ondansetron (ZOFRAN) 8 MG tablet Take 1 tablet (8 mg total) by mouth 2 (two) times daily as needed for refractory nausea / vomiting. Start on day 3 after cyclophosphamide chemotherapy. 30 tablet 1  . pantoprazole (PROTONIX) 40 MG tablet Take 1 tablet (40 mg total) by mouth 2 (two) times daily for 30 days. 60 tablet 0  . Polyethyl Glycol-Propyl Glycol (SYSTANE OP) Place 1 drop into both eyes at bedtime.     . polyethylene glycol (MIRALAX) packet Take 17 g by mouth daily. 30 each 1  . predniSONE (DELTASONE) 20 MG tablet Take 3 tablets (60 mg total) by mouth daily. Take on days 1-5 of chemotherapy. 15 tablet 5  . prochlorperazine (COMPAZINE) 10 MG tablet Take 1 tablet (10 mg total) by mouth every 6 (six) hours as needed (Nausea or vomiting). 30 tablet 6  . senna-docusate (SENNA S) 8.6-50 MG tablet Take 2 tablets by mouth at bedtime as needed for mild constipation or moderate constipation. 60 tablet 1  . white petrolatum (VASELINE) GEL Apply 1 application topically as needed for dry skin (itching).     No current facility-administered medications for this visit.     REVIEW OF SYSTEMS:    A 10+ POINT REVIEW OF SYSTEMS WAS OBTAINED including neurology, dermatology, psychiatry, cardiac, respiratory, lymph, extremities, GI, GU, Musculoskeletal, constitutional, breasts, reproductive, HEENT.  All pertinent positives are noted in the HPI.  All others are negative.   PHYSICAL EXAMINATION: ECOG PERFORMANCE STATUS: 2 - Symptomatic, <50% confined to bed  Vitals:   06/07/18 1100  BP: 117/76  Pulse: (!) 56  Resp: 17  Temp: 98.1 F (36.7 C)  SpO2: 100%   Filed Weights   06/07/18 1100  Weight: 241 lb 4.8 oz (109.5 kg)   .Body mass index is 31.84 kg/m.   GENERAL:alert, in no acute distress and comfortable SKIN:  no acute rashes, no significant lesions EYES: conjunctiva are pink and non-injected, sclera anicteric OROPHARYNX: MMM, no exudates, no oropharyngeal erythema or ulceration NECK: supple, no JVD LYMPH:  no palpable lymphadenopathy in the cervical, axillary or inguinal regions LUNGS: clear to auscultation b/l with normal respiratory effort HEART: regular rate & rhythm ABDOMEN:  normoactive bowel sounds , non tender, not distended. No palpable hepatosplenomegaly.  Extremity: no pedal edema PSYCH: alert & oriented x 3 with fluent speech NEURO: no focal motor/sensory deficits   LABORATORY DATA:  I have reviewed the data as listed  . CBC Latest Ref Rng & Units 06/07/2018 05/17/2018 04/27/2018  WBC 4.0 - 10.5 K/uL 6.4 3.8(L) 11.1(H)  Hemoglobin 13.0 - 17.0 g/dL 13.5 11.9(L) 9.2(L)  Hematocrit 39.0 - 52.0 % 41.4 37.3(L) 29.1(L)  Platelets 150 - 400 K/uL 209 166 140(L)    . CMP Latest Ref Rng & Units 06/07/2018 05/17/2018 04/27/2018  Glucose 70 - 99 mg/dL 101(H) 108(H) 108(H)  BUN 8 - 23 mg/dL 11 11 34(H)  Creatinine 0.61 - 1.24 mg/dL 1.09 1.09 1.04  Sodium 135 - 145 mmol/L 140 142 139  Potassium 3.5 - 5.1 mmol/L 4.7 3.7 3.7  Chloride 98 - 111 mmol/L 107 108 110  CO2 22 - 32 mmol/L 25 25 25   Calcium 8.9 - 10.3 mg/dL 8.9 8.6(L) 8.1(L)  Total Protein 6.5 - 8.1 g/dL 6.7 6.2(L) 5.0(L)  Total Bilirubin 0.3 - 1.2 mg/dL 0.6 0.7 1.0  Alkaline Phos 38 - 126 U/L 90 62 56  AST 15 - 41 U/L 20 19 17   ALT 0 - 44 U/L 17 16 18    01/05/18 Pathology:    RADIOGRAPHIC STUDIES: I have personally reviewed the radiological images as listed and agreed with the findings in the report. No results found.  ASSESSMENT & PLAN:   78 y.o. male with  1. Primary Testicular Diffuse Large B-Cell Lymphoma, Stage 1E 11/30/16 NM Myocar Multi w/spect w/wall motion which revealed a LV EF of 44%  12/08/17 US Scrotum revealed Abnormal appearance of the left  testicle which is larger than the right and diffusely heterogeneous in echotexture. Cannot exclude infiltrating process/mass. Appearance is concerning for possible neoplasm.  01/05/18 Left testes biopsy revealed Primary Testicular Diffuse Large B-Cell Lymphoma   01/25/18 Hep B, Hep C and HIV negative  01/31/18 ECHO revealed a LV EF of 51%. Left ventricle: The cavity size was normal. Wall motion was normal; there were no regional wall motion abnormalities. Atrial septum: No defect or patent foramen ovale was identified.  02/07/18 PET/CT revealed No findings for metabolically active lymphoma involving the neck, chest, abdomen/pelvis or osseous structures.  PLAN -Discussed pt labwork today, 06/07/18; blood counts and chemistries are normal -The pt has no prohibitive toxicities from continuing R-mini-CHOP at this time. -Due to admission after first intrathecal methotrexate, concern for brain toxicity given patient's age, and concern for treatment tolerance, will hold IT MTX. No further concerns for bleeding, nausea, or vomiting. -Planning to refer pt to [facility] after C6  -Continue Senna S two tablets at night time, and Miralax -Continue with infection prevention strategies with the pt including crowd avoidance and wearing a mask while in public -Recommended that the pt continue to eat well, drink at least 48-64 oz of water each day, and walk 20-30 minutes each day. -Previously recommended that the pt obtain a power of attorney -Will see the pt back in 3 weeks  2.  . Past Medical History:  Diagnosis Date  . Arthritis   .  Coronary artery disease   . HOH (hard of hearing)   . Hyperlipidemia   . Hypertension   . Ischemic cardiomyopathy   . Myocardial infarction ([place]) 2016  . Quadriceps tendon rupture    LEFT  -continue f/u with PCP -given systolic dysfunction will rpt ECHO   Please schedule cycle 5 and Cycle 6 of chemotherapy as ordered with labs and MD visits   All of the patients  questions were answered with apparent satisfaction. The patient knows to call the clinic with any problems, questions or concerns.  The total time spent in the appt was 25 minutes and more than 50% was on counseling and direct patient cares.    Sullivan Lone MD MS AAHIVMS [facility] Hematology/Oncology Physician [facility]  (Office):       [phone] (Work cell):  [phone] (Fax):           [phone]  06/07/2018 11:41 AM  I, Baldwin Jamaica, am acting as a scribe for Dr. Sullivan Lone.   .I have reviewed the above documentation for accuracy and completeness, and I agree with the above. Brunetta Genera MD

## 2018-06-07 ENCOUNTER — Other Ambulatory Visit: Payer: Self-pay

## 2018-06-07 ENCOUNTER — Inpatient Hospital Stay: Payer: Medicare HMO

## 2018-06-07 ENCOUNTER — Telehealth: Payer: Self-pay | Admitting: Hematology

## 2018-06-07 ENCOUNTER — Inpatient Hospital Stay (HOSPITAL_BASED_OUTPATIENT_CLINIC_OR_DEPARTMENT_OTHER): Payer: Medicare HMO | Admitting: Hematology

## 2018-06-07 VITALS — BP 117/76 | HR 56 | Temp 98.1°F | Resp 17 | Ht 73.0 in | Wt 241.3 lb

## 2018-06-07 VITALS — BP 108/74 | HR 56 | Temp 97.7°F | Resp 17

## 2018-06-07 DIAGNOSIS — Z79899 Other long term (current) drug therapy: Secondary | ICD-10-CM | POA: Diagnosis not present

## 2018-06-07 DIAGNOSIS — Z95828 Presence of other vascular implants and grafts: Secondary | ICD-10-CM

## 2018-06-07 DIAGNOSIS — Z5112 Encounter for antineoplastic immunotherapy: Secondary | ICD-10-CM | POA: Diagnosis not present

## 2018-06-07 DIAGNOSIS — Z5189 Encounter for other specified aftercare: Secondary | ICD-10-CM | POA: Diagnosis not present

## 2018-06-07 DIAGNOSIS — E785 Hyperlipidemia, unspecified: Secondary | ICD-10-CM | POA: Diagnosis not present

## 2018-06-07 DIAGNOSIS — Z7982 Long term (current) use of aspirin: Secondary | ICD-10-CM

## 2018-06-07 DIAGNOSIS — C83398 Diffuse large b-cell lymphoma of other extranodal and solid organ sites: Secondary | ICD-10-CM

## 2018-06-07 DIAGNOSIS — C8339 Diffuse large B-cell lymphoma, extranodal and solid organ sites: Secondary | ICD-10-CM

## 2018-06-07 DIAGNOSIS — C8335 Diffuse large B-cell lymphoma, lymph nodes of inguinal region and lower limb: Secondary | ICD-10-CM | POA: Diagnosis not present

## 2018-06-07 DIAGNOSIS — Z5111 Encounter for antineoplastic chemotherapy: Secondary | ICD-10-CM | POA: Diagnosis not present

## 2018-06-07 LAB — CBC WITH DIFFERENTIAL/PLATELET
Abs Immature Granulocytes: 0.07 10*3/uL (ref 0.00–0.07)
Basophils Absolute: 0.1 10*3/uL (ref 0.0–0.1)
Basophils Relative: 1 %
Eosinophils Absolute: 0.1 10*3/uL (ref 0.0–0.5)
Eosinophils Relative: 1 %
HCT: 41.4 % (ref 39.0–52.0)
Hemoglobin: 13.5 g/dL (ref 13.0–17.0)
Immature Granulocytes: 1 %
Lymphocytes Relative: 11 %
Lymphs Abs: 0.7 10*3/uL (ref 0.7–4.0)
MCH: 31.4 pg (ref 26.0–34.0)
MCHC: 32.6 g/dL (ref 30.0–36.0)
MCV: 96.3 fL (ref 80.0–100.0)
Monocytes Absolute: 0.4 10*3/uL (ref 0.1–1.0)
Monocytes Relative: 6 %
Neutro Abs: 5.1 10*3/uL (ref 1.7–7.7)
Neutrophils Relative %: 80 %
Platelets: 209 10*3/uL (ref 150–400)
RBC: 4.3 MIL/uL (ref 4.22–5.81)
RDW: 12.6 % (ref 11.5–15.5)
WBC: 6.4 10*3/uL (ref 4.0–10.5)
nRBC: 0 % (ref 0.0–0.2)

## 2018-06-07 LAB — CMP (CANCER CENTER ONLY)
ALT: 17 U/L (ref 0–44)
AST: 20 U/L (ref 15–41)
Albumin: 3.5 g/dL (ref 3.5–5.0)
Alkaline Phosphatase: 90 U/L (ref 38–126)
Anion gap: 8 (ref 5–15)
BUN: 11 mg/dL (ref 8–23)
CO2: 25 mmol/L (ref 22–32)
Calcium: 8.9 mg/dL (ref 8.9–10.3)
Chloride: 107 mmol/L (ref 98–111)
Creatinine: 1.09 mg/dL (ref 0.61–1.24)
GFR, Est AFR Am: >60 mL/min (ref >60)
GFR, Estimated: >60 mL/min (ref >60)
Glucose, Bld: 101 mg/dL — ABNORMAL HIGH (ref 70–99)
Potassium: 4.7 mmol/L (ref 3.5–5.1)
Sodium: 140 mmol/L (ref 135–145)
Total Bilirubin: 0.6 mg/dL (ref 0.3–1.2)
Total Protein: 6.7 g/dL (ref 6.5–8.1)

## 2018-06-07 MED ORDER — PALONOSETRON HCL INJECTION 0.25 MG/5ML
0.2500 mg | Freq: Once | INTRAVENOUS | Status: AC
  Administered 2018-06-07: 0.25 mg via INTRAVENOUS

## 2018-06-07 MED ORDER — DIPHENHYDRAMINE HCL 25 MG PO CAPS
ORAL_CAPSULE | ORAL | Status: AC
  Filled 2018-06-07: qty 2

## 2018-06-07 MED ORDER — HEPARIN SOD (PORK) LOCK FLUSH 100 UNIT/ML IV SOLN
500.0000 [IU] | Freq: Once | INTRAVENOUS | Status: AC | PRN
  Administered 2018-06-07: 500 [IU]
  Filled 2018-06-07: qty 5

## 2018-06-07 MED ORDER — DIPHENHYDRAMINE HCL 25 MG PO CAPS
50.0000 mg | ORAL_CAPSULE | Freq: Once | ORAL | Status: AC
  Administered 2018-06-07: 50 mg via ORAL

## 2018-06-07 MED ORDER — SODIUM CHLORIDE 0.9 % IV SOLN
400.0000 mg/m2 | Freq: Once | INTRAVENOUS | Status: AC
  Administered 2018-06-07: 940 mg via INTRAVENOUS
  Filled 2018-06-07: qty 47

## 2018-06-07 MED ORDER — ACETAMINOPHEN 325 MG PO TABS
650.0000 mg | ORAL_TABLET | Freq: Once | ORAL | Status: AC
  Administered 2018-06-07: 650 mg via ORAL

## 2018-06-07 MED ORDER — DOXORUBICIN HCL CHEMO IV INJECTION 2 MG/ML
25.0000 mg/m2 | Freq: Once | INTRAVENOUS | Status: AC
  Administered 2018-06-07: 58 mg via INTRAVENOUS
  Filled 2018-06-07: qty 29

## 2018-06-07 MED ORDER — PALONOSETRON HCL INJECTION 0.25 MG/5ML
INTRAVENOUS | Status: AC
  Filled 2018-06-07: qty 5

## 2018-06-07 MED ORDER — SODIUM CHLORIDE 0.9% FLUSH
10.0000 mL | INTRAVENOUS | Status: DC | PRN
  Administered 2018-06-07: 10 mL
  Filled 2018-06-07: qty 10

## 2018-06-07 MED ORDER — SODIUM CHLORIDE 0.9 % IV SOLN
Freq: Once | INTRAVENOUS | Status: AC
  Administered 2018-06-07: 13:00:00 via INTRAVENOUS
  Filled 2018-06-07: qty 5

## 2018-06-07 MED ORDER — SODIUM CHLORIDE 0.9% FLUSH
10.0000 mL | INTRAVENOUS | Status: DC | PRN
  Administered 2018-06-07: 17:00:00 10 mL
  Filled 2018-06-07: qty 10

## 2018-06-07 MED ORDER — SODIUM CHLORIDE 0.9 % IV SOLN
375.0000 mg/m2 | Freq: Once | INTRAVENOUS | Status: AC
  Administered 2018-06-07: 900 mg via INTRAVENOUS
  Filled 2018-06-07: qty 50

## 2018-06-07 MED ORDER — ACETAMINOPHEN 325 MG PO TABS
ORAL_TABLET | ORAL | Status: AC
  Filled 2018-06-07: qty 2

## 2018-06-07 MED ORDER — SODIUM CHLORIDE 0.9 % IV SOLN
Freq: Once | INTRAVENOUS | Status: AC
  Administered 2018-06-07: 13:00:00 via INTRAVENOUS
  Filled 2018-06-07: qty 250

## 2018-06-07 MED ORDER — VINCRISTINE SULFATE CHEMO INJECTION 1 MG/ML
1.0000 mg | Freq: Once | INTRAVENOUS | Status: AC
  Administered 2018-06-07: 1 mg via INTRAVENOUS
  Filled 2018-06-07: qty 1

## 2018-06-07 NOTE — Telephone Encounter (Signed)
Per 4/28 los, appts already scheduled.

## 2018-06-07 NOTE — Patient Instructions (Signed)
[facility] Discharge Instructions for Patients Receiving Chemotherapy  Today you received the following chemotherapy agents: Adriamycin (Doxorubicin), Vincristine (Oncovin), Cyclophosphamide (Cytoxan), Rituximab (Rituxan)  To help prevent nausea and vomiting after your treatment, we encourage you to take your nausea medication as directed. Received Aloxi during treatment today-->Take your Compazine prescription (not Zofran) for the next 3 days as needed.   If you develop nausea and vomiting that is not controlled by your nausea medication, call the clinic.   BELOW ARE SYMPTOMS THAT SHOULD BE REPORTED IMMEDIATELY:  *FEVER GREATER THAN 100.5 F  *CHILLS WITH OR WITHOUT FEVER  NAUSEA AND VOMITING THAT IS NOT CONTROLLED WITH YOUR NAUSEA MEDICATION  *UNUSUAL SHORTNESS OF BREATH  *UNUSUAL BRUISING OR BLEEDING  TENDERNESS IN MOUTH AND THROAT WITH OR WITHOUT PRESENCE OF ULCERS  *URINARY PROBLEMS  *BOWEL PROBLEMS  UNUSUAL RASH Items with * indicate a potential emergency and should be followed up as soon as possible.  Feel free to call the clinic should you have any questions or concerns. The clinic phone number is [phone].  Please show the [facility] at check-in to the Emergency Department and triage nurse.

## 2018-06-09 ENCOUNTER — Other Ambulatory Visit: Payer: Self-pay

## 2018-06-09 ENCOUNTER — Inpatient Hospital Stay: Payer: Medicare HMO

## 2018-06-09 DIAGNOSIS — C8335 Diffuse large B-cell lymphoma, lymph nodes of inguinal region and lower limb: Secondary | ICD-10-CM | POA: Diagnosis not present

## 2018-06-09 DIAGNOSIS — Z5112 Encounter for antineoplastic immunotherapy: Secondary | ICD-10-CM | POA: Diagnosis not present

## 2018-06-09 DIAGNOSIS — Z5189 Encounter for other specified aftercare: Secondary | ICD-10-CM | POA: Diagnosis not present

## 2018-06-09 DIAGNOSIS — Z79899 Other long term (current) drug therapy: Secondary | ICD-10-CM | POA: Diagnosis not present

## 2018-06-09 DIAGNOSIS — C8339 Diffuse large B-cell lymphoma, extranodal and solid organ sites: Secondary | ICD-10-CM

## 2018-06-09 DIAGNOSIS — Z7982 Long term (current) use of aspirin: Secondary | ICD-10-CM | POA: Diagnosis not present

## 2018-06-09 DIAGNOSIS — E785 Hyperlipidemia, unspecified: Secondary | ICD-10-CM | POA: Diagnosis not present

## 2018-06-09 DIAGNOSIS — Z5111 Encounter for antineoplastic chemotherapy: Secondary | ICD-10-CM | POA: Diagnosis not present

## 2018-06-09 MED ORDER — PEGFILGRASTIM-CBQV 6 MG/0.6ML ~~LOC~~ SOSY
PREFILLED_SYRINGE | SUBCUTANEOUS | Status: AC
  Filled 2018-06-09: qty 0.6

## 2018-06-09 MED ORDER — PEGFILGRASTIM-CBQV 6 MG/0.6ML ~~LOC~~ SOSY
6.0000 mg | PREFILLED_SYRINGE | Freq: Once | SUBCUTANEOUS | Status: AC
  Administered 2018-06-09: 6 mg via SUBCUTANEOUS

## 2018-06-09 NOTE — Patient Instructions (Signed)
Pegfilgrastim injection  What is this medicine?  PEGFILGRASTIM (PEG fil gra stim) is a long-acting granulocyte colony-stimulating factor that stimulates the growth of neutrophils, a type of white blood cell important in the body's fight against infection. It is used to reduce the incidence of fever and infection in patients with certain types of cancer who are receiving chemotherapy that affects the bone marrow, and to increase survival after being exposed to high doses of radiation.  This medicine may be used for other purposes; ask your health care provider or pharmacist if you have questions.  COMMON BRAND NAME(S): Fulphila, Neulasta, UDENYCA  What should I tell my health care provider before I take this medicine?  They need to know if you have any of these conditions:  -kidney disease  -latex allergy  -ongoing radiation therapy  -sickle cell disease  -skin reactions to acrylic adhesives (On-Body Injector only)  -an unusual or allergic reaction to pegfilgrastim, filgrastim, other medicines, foods, dyes, or preservatives  -pregnant or trying to get pregnant  -breast-feeding  How should I use this medicine?  This medicine is for injection under the skin. If you get this medicine at home, you will be taught how to prepare and give the pre-filled syringe or how to use the On-body Injector. Refer to the patient Instructions for Use for detailed instructions. Use exactly as directed. Tell your healthcare provider immediately if you suspect that the On-body Injector may not have performed as intended or if you suspect the use of the On-body Injector resulted in a missed or partial dose.  It is important that you put your used needles and syringes in a special sharps container. Do not put them in a trash can. If you do not have a sharps container, call your pharmacist or healthcare provider to get one.  Talk to your pediatrician regarding the use of this medicine in children. While this drug may be prescribed for  selected conditions, precautions do apply.  Overdosage: If you think you have taken too much of this medicine contact a poison control center or emergency room at once.  NOTE: This medicine is only for you. Do not share this medicine with others.  What if I miss a dose?  It is important not to miss your dose. Call your doctor or health care professional if you miss your dose. If you miss a dose due to an On-body Injector failure or leakage, a new dose should be administered as soon as possible using a single prefilled syringe for manual use.  What may interact with this medicine?  Interactions have not been studied.  Give your health care provider a list of all the medicines, herbs, non-prescription drugs, or dietary supplements you use. Also tell them if you smoke, drink alcohol, or use illegal drugs. Some items may interact with your medicine.  This list may not describe all possible interactions. Give your health care provider a list of all the medicines, herbs, non-prescription drugs, or dietary supplements you use. Also tell them if you smoke, drink alcohol, or use illegal drugs. Some items may interact with your medicine.  What should I watch for while using this medicine?  You may need blood work done while you are taking this medicine.  If you are going to need a MRI, CT scan, or other procedure, tell your doctor that you are using this medicine (On-Body Injector only).  What side effects may I notice from receiving this medicine?  Side effects that you should report to   your doctor or health care professional as soon as possible:  -allergic reactions like skin rash, itching or hives, swelling of the face, lips, or tongue  -back pain  -dizziness  -fever  -pain, redness, or irritation at site where injected  -pinpoint red spots on the skin  -red or dark-brown urine  -shortness of breath or breathing problems  -stomach or side pain, or pain at the shoulder  -swelling  -tiredness  -trouble passing urine or  change in the amount of urine  Side effects that usually do not require medical attention (report to your doctor or health care professional if they continue or are bothersome):  -bone pain  -muscle pain  This list may not describe all possible side effects. Call your doctor for medical advice about side effects. You may report side effects to FDA at 1-800-FDA-1088.  Where should I keep my medicine?  Keep out of the reach of children.  If you are using this medicine at home, you will be instructed on how to store it. Throw away any unused medicine after the expiration date on the label.  NOTE: This sheet is a summary. It may not cover all possible information. If you have questions about this medicine, talk to your doctor, pharmacist, or health care provider.   2019 Elsevier/Gold Standard (2017-05-03 16:57:08)

## 2018-06-24 DIAGNOSIS — M79671 Pain in right foot: Secondary | ICD-10-CM | POA: Diagnosis not present

## 2018-06-24 DIAGNOSIS — M79672 Pain in left foot: Secondary | ICD-10-CM | POA: Diagnosis not present

## 2018-06-24 DIAGNOSIS — M792 Neuralgia and neuritis, unspecified: Secondary | ICD-10-CM | POA: Diagnosis not present

## 2018-06-27 NOTE — Progress Notes (Signed)
HEMATOLOGY/ONCOLOGY CLINIC NOTE  Date of Service: 06/28/2018  Patient Care Team: [name], MD as PCP - General (Family Medicine)  CHIEF COMPLAINTS/PURPOSE OF CONSULTATION:  Primary testicular diffuse Large B-Cell Lymphoma   HISTORY OF PRESENTING ILLNESS:   Carl [name] is a wonderful 78 y.o. male who has been referred to [place] by Dr. [name] for evaluation and management of Diffuse Large B-Cell Lymphoma. The pt reports that he is doing well overall.  The pt notes that he first began feeling differently about 4-5 months ago, when he noticed left testicular swelling. He notes that the his testicle was "a little bit larger than a golf ball," and that this was not painful. The pt notes that he has not had any abdominal distension or abdominal pain. The pt denies any fevers, chills, night sweats, or unexpected weight loss. He also denies any leg swelling. The pt denies any trauma sustained by the testicles. He denies noticing any other related symptoms.   The pt reports that he believed he had food poisoning prior to this, as he had lots of diarrhea. He notes that he has observed a small knot on the back of his right wrist, which has not changed over the last year.  The pt has two stents in his heart which were placed in 2016. The pt notes that his blood pressure has been stable and well-controlled. He notes that his last ECHO was 6 months ago, and he has not had any CP or SOB since that time. He takes 81mg  aspirin daily. He also takes Atorvastatin. The pt denies feeling limited in his walking by SOB or CP. He notes that he could walk 2 miles before ceasing to walk, noting that feeling tired in his muscles would limit him.   The pt has recently pursued surgery on his throat with ENT Dr. Melida Quitter for altered voice and hoarseness related to his vocal cords.   The pt notes that he currently lives by himself, and is not married. He has a daughter who is 50 years old. He  notes that his daughter "tries to help out." He notes that he does not have a power of attorney, but that this would be his daughter.   Of note prior to the patient's visit today, pt has had a Left Testis Surgical biopsy completed on 01/05/18 with results revealing Diffuse Large B-Cell Lymphoma.   The pt also had an US Scrotum on 12/08/17 which revealed Abnormal appearance of the left testicle which is larger than the right and diffusely heterogeneous in echotexture. Cannot exclude infiltrating process/mass. Appearance is concerning for possible neoplasm.  Most recent lab results (11/26/17) of BMP revealed all values WNL except for Glucose at 104, Creatinine at 1.30, Calcium at 8.7, GFR at 59 11/26/17 Hemoglobin value at 13.6.   On review of systems, pt reports stable energy levels, left testicular swelling, and denies fevers, chills, night sweats, unexpected weight loss, CP, SOB, back pains, flank pains, abdominal pains, leg swelling, testicular pain, and any other symptoms.   On PMHx the pt reports CAD, two heart stents placed in 2016. On Social Hx the pt denies smoking.  On Family Hx the pt reports mother with lymphoma of the breast. Two sisters with breast cancer, one at age 7.  Interval History:   Carl [name] returns today for management and evaluation of his Primary testicular Diffuse Large B-Cell Lymphoma C5 R-mini-CHOP. The patient's last visit with [place] was on 06/07/18. The  pt reports that he is doing well overall.  The pt reports that his toes have intermittently become slightly numb in the mornings when he wakes up, improves after he exercises, does not notice this happening at nighttime. The pt denies tingling or numbness in his hands, changes in breathing, abdominal pains, headaches, scrotal pain or swelling. He notes that he has continued to enjoy good energy levels and is eating well. The pt reports that he has not needed to use any of his nausea medications.  Lab results  today (06/28/18) of CBC w/diff and CMP is as follows: all values are WNL except for RBC at 4.19, Glucose at 106, Calcium at 8.6, Total Protein at 6.3, Albumin at 3.4.  On review of systems, pt reports eating well, good energy levels, eating well, staying active, occasional numbness in toes, and denies leg swelling, changes in breathing, abdominal pains, neuropathy in hands, nausea, scrotal pain or swelling, headaches, mouth sores, and any other symptoms.   MEDICAL HISTORY:  Past Medical History:  Diagnosis Date   Arthritis    Coronary artery disease    HOH (hard of hearing)    Hyperlipidemia    Hypertension    Ischemic cardiomyopathy    Myocardial infarction ([place]) 2016   Quadriceps tendon rupture    LEFT    SURGICAL HISTORY: Past Surgical History:  Procedure Laterality Date   BIOPSY  04/27/2018   Procedure: BIOPSY;  Surgeon: Ronnette Juniper, MD;  Location: [facility];  Service: Gastroenterology;;   CARDIAC CATHETERIZATION N/A 10/15/2014   Procedure: Left Heart Cath and Coronary Angiography;  Surgeon: Charolette Forward, MD;  Location: [facility];  Service: Cardiovascular;  Laterality: N/A;   COLONOSCOPY     ESOPHAGOGASTRODUODENOSCOPY (EGD) WITH PROPOFOL N/A 04/27/2018   Procedure: ESOPHAGOGASTRODUODENOSCOPY (EGD) WITH PROPOFOL;  Surgeon: Ronnette Juniper, MD;  Location: [facility];  Service: Gastroenterology;  Laterality: N/A;   IR IMAGING GUIDED PORT INSERTION  03/14/2018   MICROLARYNGOSCOPY WITH CO2 LASER AND EXCISION OF VOCAL CORD LESION N/A 11/26/2017   Procedure: MICROLARYNGOSCOPY WITH CO2 LASER AND EXCISION OF VOCAL CORD LESION WITH JET VENTILATION;  Surgeon: Melida Quitter, MD;  Location: [facility];  Service: ENT;  Laterality: N/A;   QUADRICEPS TENDON REPAIR Left 02/20/2014   Procedure: LEFT REPAIR QUADRICEP TENDON;  Surgeon: Mauri Pole, MD;  Location: [facility];  Service: Orthopedics;  Laterality: Left;   THROAT SURGERY  2010    SOCIAL HISTORY: Social History    Socioeconomic History   Marital status: Divorced    Spouse name: Not on file   Number of children: Not on file   Years of education: Not on file   Highest education level: Not on file  Occupational History   Not on file  Social Needs   Financial resource strain: Not on file   Food insecurity:    Worry: Not on file    Inability: Not on file   Transportation needs:    Medical: Not on file    Non-medical: Not on file  Tobacco Use   Smoking status: Never Smoker   Smokeless tobacco: Never Used  Substance and Sexual Activity   Alcohol use: Not Currently   Drug use: No   Sexual activity: Not on file  Lifestyle   Physical activity:    Days per week: Not on file    Minutes per session: Not on file   Stress: Not on file  Relationships   Social connections:    Talks on phone: Not on file  Gets together: Not on file    Attends religious service: Not on file    Active member of club or organization: Not on file    Attends meetings of clubs or organizations: Not on file    Relationship status: Not on file   Intimate partner violence:    Fear of current or ex partner: Not on file    Emotionally abused: Not on file    Physically abused: Not on file    Forced sexual activity: Not on file  Other Topics Concern   Not on file  Social History Narrative   Not on file    FAMILY HISTORY: No family history on file.  ALLERGIES:  has No Known Allergies.  MEDICATIONS:  Current Outpatient Medications  Medication Sig Dispense Refill   aspirin EC 81 MG tablet Take 81 mg by mouth daily.     atorvastatin (LIPITOR) 40 MG tablet Take 1 tablet (40 mg total) by mouth daily at 6 PM. 60 tablet 3   carvedilol (COREG) 6.25 MG tablet Take 6.25 mg by mouth 2 (two) times daily with a meal.     cholecalciferol (VITAMIN D) 1000 UNITS tablet Take 1,000 Units by mouth daily.     losartan (COZAAR) 25 MG tablet Take 25 mg by mouth every evening.     Multiple Vitamin  (MULTIVITAMIN WITH MINERALS) TABS tablet Take 1 tablet by mouth daily.     nitroGLYCERIN (NITROSTAT) 0.4 MG SL tablet Place 1 tablet (0.4 mg total) under the tongue every 5 (five) minutes x 3 doses as needed for chest pain. 25 tablet 12   ondansetron (ZOFRAN) 8 MG tablet Take 1 tablet (8 mg total) by mouth 2 (two) times daily as needed for refractory nausea / vomiting. Start on day 3 after cyclophosphamide chemotherapy. 30 tablet 1   pantoprazole (PROTONIX) 40 MG tablet Take 1 tablet (40 mg total) by mouth 2 (two) times daily for 30 days. 60 tablet 0   Polyethyl Glycol-Propyl Glycol (SYSTANE OP) Place 1 drop into both eyes at bedtime.      polyethylene glycol (MIRALAX) packet Take 17 g by mouth daily. 30 each 1   predniSONE (DELTASONE) 20 MG tablet Take 3 tablets (60 mg total) by mouth daily. Take on days 1-5 of chemotherapy. 15 tablet 5   prochlorperazine (COMPAZINE) 10 MG tablet Take 1 tablet (10 mg total) by mouth every 6 (six) hours as needed (Nausea or vomiting). 30 tablet 6   senna-docusate (SENNA S) 8.6-50 MG tablet Take 2 tablets by mouth at bedtime as needed for mild constipation or moderate constipation. 60 tablet 1   white petrolatum (VASELINE) GEL Apply 1 application topically as needed for dry skin (itching).     No current facility-administered medications for this visit.     REVIEW OF SYSTEMS:    A 10+ POINT REVIEW OF SYSTEMS WAS OBTAINED including neurology, dermatology, psychiatry, cardiac, respiratory, lymph, extremities, GI, GU, Musculoskeletal, constitutional, breasts, reproductive, HEENT.  All pertinent positives are noted in the HPI.  All others are negative.   PHYSICAL EXAMINATION: ECOG PERFORMANCE STATUS: 2 - Symptomatic, <50% confined to bed  Vitals:   06/28/18 1045  BP: 123/74  Pulse: 64  Resp: 18  Temp: 98.1 F (36.7 C)  SpO2: 98%   Filed Weights   06/28/18 1045  Weight: 243 lb 9.6 oz (110.5 kg)   .Body mass index is 32.14 kg/m.  GENERAL:alert,  in no acute distress and comfortable SKIN: no acute rashes, no significant lesions EYES: conjunctiva  are pink and non-injected, sclera anicteric OROPHARYNX: MMM, no exudates, no oropharyngeal erythema or ulceration NECK: supple, no JVD LYMPH:  no palpable lymphadenopathy in the cervical, axillary or inguinal regions LUNGS: clear to auscultation b/l with normal respiratory effort HEART: regular rate & rhythm ABDOMEN:  normoactive bowel sounds , non tender, not distended. No palpable hepatosplenomegaly.  Extremity: no pedal edema PSYCH: alert & oriented x 3 with fluent speech NEURO: no focal motor/sensory deficits    LABORATORY DATA:  I have reviewed the data as listed  . CBC Latest Ref Rng & Units 06/28/2018 06/07/2018 05/17/2018  WBC 4.0 - 10.5 K/uL 5.9 6.4 3.8(L)  Hemoglobin 13.0 - 17.0 g/dL 13.0 13.5 11.9(L)  Hematocrit 39.0 - 52.0 % 40.8 41.4 37.3(L)  Platelets 150 - 400 K/uL 199 209 166    . CMP Latest Ref Rng & Units 06/28/2018 06/07/2018 05/17/2018  Glucose 70 - 99 mg/dL 106(H) 101(H) 108(H)  BUN 8 - 23 mg/dL 16 11 11   Creatinine 0.61 - 1.24 mg/dL 1.02 1.09 1.09  Sodium 135 - 145 mmol/L 141 140 142  Potassium 3.5 - 5.1 mmol/L 4.1 4.7 3.7  Chloride 98 - 111 mmol/L 108 107 108  CO2 22 - 32 mmol/L 26 25 25   Calcium 8.9 - 10.3 mg/dL 8.6(L) 8.9 8.6(L)  Total Protein 6.5 - 8.1 g/dL 6.3(L) 6.7 6.2(L)  Total Bilirubin 0.3 - 1.2 mg/dL 0.5 0.6 0.7  Alkaline Phos 38 - 126 U/L 85 90 62  AST 15 - 41 U/L 20 20 19   ALT 0 - 44 U/L 20 17 16    01/05/18 Pathology:    RADIOGRAPHIC STUDIES: I have personally reviewed the radiological images as listed and agreed with the findings in the report. No results found.  ASSESSMENT & PLAN:   78 y.o. male with  1. Primary Testicular Diffuse Large B-Cell Lymphoma, Stage 1E 11/30/16 NM Myocar Multi w/spect w/wall motion which revealed a LV EF of 44%  12/08/17 US Scrotum revealed Abnormal appearance of the left testicle which is larger than the  right and diffusely heterogeneous in echotexture. Cannot exclude infiltrating process/mass. Appearance is concerning for possible neoplasm.  01/05/18 Left testes biopsy revealed Primary Testicular Diffuse Large B-Cell Lymphoma   01/25/18 Hep B, Hep C and HIV negative  01/31/18 ECHO revealed a LV EF of 51%. Left ventricle: The cavity size was normal. Wall motion was normal; there were no regional wall motion abnormalities. Atrial septum: No defect or patent foramen ovale was identified.  02/07/18 PET/CT revealed No findings for metabolically active lymphoma involving the neck, chest, abdomen/pelvis or osseous structures.  PLAN -Discussed pt labwork today, 06/28/18; blood counts and chemistries are stable -The pt has no prohibitive toxicities from continuing C5 R-mini-CHOP at this time. -Due to admission after first intrathecal methotrexate, concern for brain toxicity given patient's age, and concern for treatment tolerance, will hold IT MTX. No further concerns for bleeding, nausea, or vomiting. -Planning to refer pt to [facility] after C6  -Continue Senna S two tablets at night time, and Miralax -Continue with infection prevention strategies with the pt including crowd avoidance and wearing a mask while in public -Recommended that the pt continue to eat well, drink at least 48-64 oz of water each day, and walk 20-30 minutes each day. -Previously recommended that the pt obtain a power of attorney -Will see the pt back in 3 weeks  2.  . Past Medical History:  Diagnosis Date   Arthritis    Coronary artery disease  HOH (hard of hearing)    Hyperlipidemia    Hypertension    Ischemic cardiomyopathy    Myocardial infarction [facility]) 2016   Quadriceps tendon rupture    LEFT  -continue f/u with PCP    F/u as scheduled as per current appointments including C6 on 07/19/2018 with labs, MD visit, chemotherapy   All of the patients questions were answered with apparent satisfaction. The  patient knows to call the clinic with any problems, questions or concerns.  The total time spent in the appt was 25 minutes and more than 50% was on counseling and direct patient cares.    Sullivan Lone MD MS AAHIVMS [facility] Hematology/Oncology Physician [employer]  (Office):       [phone] (Work cell):  [phone] (Fax):           [phone]  06/28/2018 11:21 AM  I, Baldwin Jamaica, am acting as a scribe for Dr. Sullivan Lone.   .I have reviewed the above documentation for accuracy and completeness, and I agree with the above. Brunetta Genera MD

## 2018-06-28 ENCOUNTER — Inpatient Hospital Stay: Payer: Medicare HMO

## 2018-06-28 ENCOUNTER — Inpatient Hospital Stay: Payer: Medicare HMO | Attending: Hematology

## 2018-06-28 ENCOUNTER — Other Ambulatory Visit: Payer: Self-pay

## 2018-06-28 ENCOUNTER — Telehealth: Payer: Self-pay | Admitting: Hematology

## 2018-06-28 ENCOUNTER — Inpatient Hospital Stay (HOSPITAL_BASED_OUTPATIENT_CLINIC_OR_DEPARTMENT_OTHER): Payer: Medicare HMO | Admitting: Hematology

## 2018-06-28 VITALS — BP 97/65 | HR 54 | Temp 98.0°F | Resp 16

## 2018-06-28 VITALS — BP 123/74 | HR 64 | Temp 98.1°F | Resp 18 | Ht 73.0 in | Wt 243.6 lb

## 2018-06-28 DIAGNOSIS — C8339 Diffuse large B-cell lymphoma, extranodal and solid organ sites: Secondary | ICD-10-CM

## 2018-06-28 DIAGNOSIS — Z5189 Encounter for other specified aftercare: Secondary | ICD-10-CM | POA: Insufficient documentation

## 2018-06-28 DIAGNOSIS — Z79899 Other long term (current) drug therapy: Secondary | ICD-10-CM | POA: Diagnosis not present

## 2018-06-28 DIAGNOSIS — R202 Paresthesia of skin: Secondary | ICD-10-CM | POA: Diagnosis not present

## 2018-06-28 DIAGNOSIS — Z5112 Encounter for antineoplastic immunotherapy: Secondary | ICD-10-CM | POA: Diagnosis not present

## 2018-06-28 DIAGNOSIS — Z5111 Encounter for antineoplastic chemotherapy: Secondary | ICD-10-CM | POA: Insufficient documentation

## 2018-06-28 DIAGNOSIS — C8335 Diffuse large B-cell lymphoma, lymph nodes of inguinal region and lower limb: Secondary | ICD-10-CM | POA: Diagnosis not present

## 2018-06-28 DIAGNOSIS — E785 Hyperlipidemia, unspecified: Secondary | ICD-10-CM

## 2018-06-28 DIAGNOSIS — Z95828 Presence of other vascular implants and grafts: Secondary | ICD-10-CM

## 2018-06-28 DIAGNOSIS — Z7982 Long term (current) use of aspirin: Secondary | ICD-10-CM

## 2018-06-28 LAB — CMP (CANCER CENTER ONLY)
ALT: 20 U/L (ref 0–44)
AST: 20 U/L (ref 15–41)
Albumin: 3.4 g/dL — ABNORMAL LOW (ref 3.5–5.0)
Alkaline Phosphatase: 85 U/L (ref 38–126)
Anion gap: 7 (ref 5–15)
BUN: 16 mg/dL (ref 8–23)
CO2: 26 mmol/L (ref 22–32)
Calcium: 8.6 mg/dL — ABNORMAL LOW (ref 8.9–10.3)
Chloride: 108 mmol/L (ref 98–111)
Creatinine: 1.02 mg/dL (ref 0.61–1.24)
GFR, Est AFR Am: >60 mL/min (ref >60)
GFR, Est Non Af Am: >60 mL/min (ref >60)
Glucose, Bld: 106 mg/dL — ABNORMAL HIGH (ref 70–99)
Potassium: 4.1 mmol/L (ref 3.5–5.1)
Sodium: 141 mmol/L (ref 135–145)
Total Bilirubin: 0.5 mg/dL (ref 0.3–1.2)
Total Protein: 6.3 g/dL — ABNORMAL LOW (ref 6.5–8.1)

## 2018-06-28 LAB — CBC WITH DIFFERENTIAL/PLATELET
Abs Immature Granulocytes: 0.05 10*3/uL (ref 0.00–0.07)
Basophils Absolute: 0.1 10*3/uL (ref 0.0–0.1)
Basophils Relative: 2 %
Eosinophils Absolute: 0.1 10*3/uL (ref 0.0–0.5)
Eosinophils Relative: 2 %
HCT: 40.8 % (ref 39.0–52.0)
Hemoglobin: 13 g/dL (ref 13.0–17.0)
Immature Granulocytes: 1 %
Lymphocytes Relative: 15 %
Lymphs Abs: 0.9 10*3/uL (ref 0.7–4.0)
MCH: 31 pg (ref 26.0–34.0)
MCHC: 31.9 g/dL (ref 30.0–36.0)
MCV: 97.4 fL (ref 80.0–100.0)
Monocytes Absolute: 0.5 10*3/uL (ref 0.1–1.0)
Monocytes Relative: 9 %
Neutro Abs: 4.2 10*3/uL (ref 1.7–7.7)
Neutrophils Relative %: 71 %
Platelets: 199 10*3/uL (ref 150–400)
RBC: 4.19 MIL/uL — ABNORMAL LOW (ref 4.22–5.81)
RDW: 13 % (ref 11.5–15.5)
WBC: 5.9 10*3/uL (ref 4.0–10.5)
nRBC: 0 % (ref 0.0–0.2)

## 2018-06-28 MED ORDER — DOXORUBICIN HCL CHEMO IV INJECTION 2 MG/ML
25.0000 mg/m2 | Freq: Once | INTRAVENOUS | Status: AC
  Administered 2018-06-28: 58 mg via INTRAVENOUS
  Filled 2018-06-28: qty 29

## 2018-06-28 MED ORDER — SODIUM CHLORIDE 0.9% FLUSH
10.0000 mL | INTRAVENOUS | Status: DC | PRN
  Administered 2018-06-28: 10 mL
  Filled 2018-06-28: qty 10

## 2018-06-28 MED ORDER — VINCRISTINE SULFATE CHEMO INJECTION 1 MG/ML
1.0000 mg | Freq: Once | INTRAVENOUS | Status: AC
  Administered 2018-06-28: 1 mg via INTRAVENOUS
  Filled 2018-06-28: qty 1

## 2018-06-28 MED ORDER — SODIUM CHLORIDE 0.9 % IV SOLN
400.0000 mg/m2 | Freq: Once | INTRAVENOUS | Status: AC
  Administered 2018-06-28: 940 mg via INTRAVENOUS
  Filled 2018-06-28: qty 47

## 2018-06-28 MED ORDER — SODIUM CHLORIDE 0.9 % IV SOLN
Freq: Once | INTRAVENOUS | Status: AC
  Administered 2018-06-28: 12:00:00 via INTRAVENOUS
  Filled 2018-06-28: qty 5

## 2018-06-28 MED ORDER — ACETAMINOPHEN 325 MG PO TABS
650.0000 mg | ORAL_TABLET | Freq: Once | ORAL | Status: AC
  Administered 2018-06-28: 650 mg via ORAL

## 2018-06-28 MED ORDER — SODIUM CHLORIDE 0.9 % IV SOLN
Freq: Once | INTRAVENOUS | Status: AC
  Administered 2018-06-28: 12:00:00 via INTRAVENOUS
  Filled 2018-06-28: qty 250

## 2018-06-28 MED ORDER — PALONOSETRON HCL INJECTION 0.25 MG/5ML
0.2500 mg | Freq: Once | INTRAVENOUS | Status: AC
  Administered 2018-06-28: 0.25 mg via INTRAVENOUS

## 2018-06-28 MED ORDER — DIPHENHYDRAMINE HCL 25 MG PO CAPS
50.0000 mg | ORAL_CAPSULE | Freq: Once | ORAL | Status: AC
  Administered 2018-06-28: 12:00:00 50 mg via ORAL

## 2018-06-28 MED ORDER — DIPHENHYDRAMINE HCL 25 MG PO CAPS
ORAL_CAPSULE | ORAL | Status: AC
  Filled 2018-06-28: qty 2

## 2018-06-28 MED ORDER — HEPARIN SOD (PORK) LOCK FLUSH 100 UNIT/ML IV SOLN
500.0000 [IU] | Freq: Once | INTRAVENOUS | Status: AC | PRN
  Administered 2018-06-28: 16:00:00 500 [IU]
  Filled 2018-06-28: qty 5

## 2018-06-28 MED ORDER — ACETAMINOPHEN 325 MG PO TABS
ORAL_TABLET | ORAL | Status: AC
  Filled 2018-06-28: qty 2

## 2018-06-28 MED ORDER — PALONOSETRON HCL INJECTION 0.25 MG/5ML
INTRAVENOUS | Status: AC
  Filled 2018-06-28: qty 5

## 2018-06-28 MED ORDER — SODIUM CHLORIDE 0.9 % IV SOLN
375.0000 mg/m2 | Freq: Once | INTRAVENOUS | Status: AC
  Administered 2018-06-28: 900 mg via INTRAVENOUS
  Filled 2018-06-28: qty 50

## 2018-06-28 NOTE — Patient Instructions (Signed)
[facility] Discharge Instructions for Patients Receiving Chemotherapy  Today you received the following chemotherapy agents: Adriamycin (Doxorubicin), Vincristine (Oncovin), Cyclophosphamide (Cytoxan), Rituximab (Rituxan)  To help prevent nausea and vomiting after your treatment, we encourage you to take your nausea medication as directed. Received Aloxi during treatment today-->Take your Compazine prescription (not Zofran) for the next 3 days as needed.   If you develop nausea and vomiting that is not controlled by your nausea medication, call the clinic.   BELOW ARE SYMPTOMS THAT SHOULD BE REPORTED IMMEDIATELY:  *FEVER GREATER THAN 100.5 F  *CHILLS WITH OR WITHOUT FEVER  NAUSEA AND VOMITING THAT IS NOT CONTROLLED WITH YOUR NAUSEA MEDICATION  *UNUSUAL SHORTNESS OF BREATH  *UNUSUAL BRUISING OR BLEEDING  TENDERNESS IN MOUTH AND THROAT WITH OR WITHOUT PRESENCE OF ULCERS  *URINARY PROBLEMS  *BOWEL PROBLEMS  UNUSUAL RASH Items with * indicate a potential emergency and should be followed up as soon as possible.  Feel free to call the clinic should you have any questions or concerns. The clinic phone number is [phone].  Please show the [facility] at check-in to the Emergency Department and triage nurse.

## 2018-06-28 NOTE — Telephone Encounter (Signed)
Per 5/19 los, appts already scheduled.

## 2018-06-30 ENCOUNTER — Inpatient Hospital Stay: Payer: Medicare HMO

## 2018-06-30 ENCOUNTER — Other Ambulatory Visit: Payer: Self-pay

## 2018-06-30 DIAGNOSIS — Z5189 Encounter for other specified aftercare: Secondary | ICD-10-CM | POA: Diagnosis not present

## 2018-06-30 DIAGNOSIS — Z5112 Encounter for antineoplastic immunotherapy: Secondary | ICD-10-CM | POA: Diagnosis not present

## 2018-06-30 DIAGNOSIS — Z5111 Encounter for antineoplastic chemotherapy: Secondary | ICD-10-CM | POA: Diagnosis not present

## 2018-06-30 DIAGNOSIS — C8339 Diffuse large B-cell lymphoma, extranodal and solid organ sites: Secondary | ICD-10-CM

## 2018-06-30 DIAGNOSIS — C8335 Diffuse large B-cell lymphoma, lymph nodes of inguinal region and lower limb: Secondary | ICD-10-CM | POA: Diagnosis not present

## 2018-06-30 MED ORDER — PEGFILGRASTIM-CBQV 6 MG/0.6ML ~~LOC~~ SOSY
PREFILLED_SYRINGE | SUBCUTANEOUS | Status: AC
  Filled 2018-06-30: qty 0.6

## 2018-06-30 MED ORDER — PEGFILGRASTIM-CBQV 6 MG/0.6ML ~~LOC~~ SOSY
6.0000 mg | PREFILLED_SYRINGE | Freq: Once | SUBCUTANEOUS | Status: AC
  Administered 2018-06-30: 6 mg via SUBCUTANEOUS

## 2018-06-30 NOTE — Patient Instructions (Signed)
Pegfilgrastim injection  What is this medicine?  PEGFILGRASTIM (PEG fil gra stim) is a long-acting granulocyte colony-stimulating factor that stimulates the growth of neutrophils, a type of white blood cell important in the body's fight against infection. It is used to reduce the incidence of fever and infection in patients with certain types of cancer who are receiving chemotherapy that affects the bone marrow, and to increase survival after being exposed to high doses of radiation.  This medicine may be used for other purposes; ask your health care provider or pharmacist if you have questions.  COMMON BRAND NAME(S): Fulphila, Neulasta, UDENYCA  What should I tell my health care provider before I take this medicine?  They need to know if you have any of these conditions:  -kidney disease  -latex allergy  -ongoing radiation therapy  -sickle cell disease  -skin reactions to acrylic adhesives (On-Body Injector only)  -an unusual or allergic reaction to pegfilgrastim, filgrastim, other medicines, foods, dyes, or preservatives  -pregnant or trying to get pregnant  -breast-feeding  How should I use this medicine?  This medicine is for injection under the skin. If you get this medicine at home, you will be taught how to prepare and give the pre-filled syringe or how to use the On-body Injector. Refer to the patient Instructions for Use for detailed instructions. Use exactly as directed. Tell your healthcare provider immediately if you suspect that the On-body Injector may not have performed as intended or if you suspect the use of the On-body Injector resulted in a missed or partial dose.  It is important that you put your used needles and syringes in a special sharps container. Do not put them in a trash can. If you do not have a sharps container, call your pharmacist or healthcare provider to get one.  Talk to your pediatrician regarding the use of this medicine in children. While this drug may be prescribed for  selected conditions, precautions do apply.  Overdosage: If you think you have taken too much of this medicine contact a poison control center or emergency room at once.  NOTE: This medicine is only for you. Do not share this medicine with others.  What if I miss a dose?  It is important not to miss your dose. Call your doctor or health care professional if you miss your dose. If you miss a dose due to an On-body Injector failure or leakage, a new dose should be administered as soon as possible using a single prefilled syringe for manual use.  What may interact with this medicine?  Interactions have not been studied.  Give your health care provider a list of all the medicines, herbs, non-prescription drugs, or dietary supplements you use. Also tell them if you smoke, drink alcohol, or use illegal drugs. Some items may interact with your medicine.  This list may not describe all possible interactions. Give your health care provider a list of all the medicines, herbs, non-prescription drugs, or dietary supplements you use. Also tell them if you smoke, drink alcohol, or use illegal drugs. Some items may interact with your medicine.  What should I watch for while using this medicine?  You may need blood work done while you are taking this medicine.  If you are going to need a MRI, CT scan, or other procedure, tell your doctor that you are using this medicine (On-Body Injector only).  What side effects may I notice from receiving this medicine?  Side effects that you should report to   your doctor or health care professional as soon as possible:  -allergic reactions like skin rash, itching or hives, swelling of the face, lips, or tongue  -back pain  -dizziness  -fever  -pain, redness, or irritation at site where injected  -pinpoint red spots on the skin  -red or dark-brown urine  -shortness of breath or breathing problems  -stomach or side pain, or pain at the shoulder  -swelling  -tiredness  -trouble passing urine or  change in the amount of urine  Side effects that usually do not require medical attention (report to your doctor or health care professional if they continue or are bothersome):  -bone pain  -muscle pain  This list may not describe all possible side effects. Call your doctor for medical advice about side effects. You may report side effects to FDA at 1-800-FDA-1088.  Where should I keep my medicine?  Keep out of the reach of children.  If you are using this medicine at home, you will be instructed on how to store it. Throw away any unused medicine after the expiration date on the label.  NOTE: This sheet is a summary. It may not cover all possible information. If you have questions about this medicine, talk to your doctor, pharmacist, or health care provider.   2019 Elsevier/Gold Standard (2017-05-03 16:57:08)

## 2018-07-08 DIAGNOSIS — H35033 Hypertensive retinopathy, bilateral: Secondary | ICD-10-CM | POA: Diagnosis not present

## 2018-07-08 DIAGNOSIS — H40013 Open angle with borderline findings, low risk, bilateral: Secondary | ICD-10-CM | POA: Diagnosis not present

## 2018-07-08 DIAGNOSIS — H2513 Age-related nuclear cataract, bilateral: Secondary | ICD-10-CM | POA: Diagnosis not present

## 2018-07-08 DIAGNOSIS — H524 Presbyopia: Secondary | ICD-10-CM | POA: Diagnosis not present

## 2018-07-08 DIAGNOSIS — H25013 Cortical age-related cataract, bilateral: Secondary | ICD-10-CM | POA: Diagnosis not present

## 2018-07-18 NOTE — Progress Notes (Signed)
HEMATOLOGY/ONCOLOGY CLINIC NOTE  Date of Service: 07/19/2018  Patient Care Team: [name], MD as PCP - General (Family Medicine)  CHIEF COMPLAINTS/PURPOSE OF CONSULTATION:  Primary testicular diffuse Large B-Cell Lymphoma   HISTORY OF PRESENTING ILLNESS:   Carl [name] is a wonderful 78 y.o. male who has been referred to [place] by Dr. [name] for evaluation and management of Diffuse Large B-Cell Lymphoma. The pt reports that he is doing well overall.  The pt notes that he first began feeling differently about 4-5 months ago, when he noticed left testicular swelling. He notes that the his testicle was "a little bit larger than a golf ball," and that this was not painful. The pt notes that he has not had any abdominal distension or abdominal pain. The pt denies any fevers, chills, night sweats, or unexpected weight loss. He also denies any leg swelling. The pt denies any trauma sustained by the testicles. He denies noticing any other related symptoms.   The pt reports that he believed he had food poisoning prior to this, as he had lots of diarrhea. He notes that he has observed a small knot on the back of his right wrist, which has not changed over the last year.  The pt has two stents in his heart which were placed in 2016. The pt notes that his blood pressure has been stable and well-controlled. He notes that his last ECHO was 6 months ago, and he has not had any CP or SOB since that time. He takes 81mg  aspirin daily. He also takes Atorvastatin. The pt denies feeling limited in his walking by SOB or CP. He notes that he could walk 2 miles before ceasing to walk, noting that feeling tired in his muscles would limit him.   The pt has recently pursued surgery on his throat with ENT Dr. Melida Quitter for altered voice and hoarseness related to his vocal cords.   The pt notes that he currently lives by himself, and is not married. He has a daughter who is 58 years old. He  notes that his daughter "tries to help out." He notes that he does not have a power of attorney, but that this would be his daughter.   Of note prior to the patient's visit today, pt has had a Left Testis Surgical biopsy completed on 01/05/18 with results revealing Diffuse Large B-Cell Lymphoma.   The pt also had an US Scrotum on 12/08/17 which revealed Abnormal appearance of the left testicle which is larger than the right and diffusely heterogeneous in echotexture. Cannot exclude infiltrating process/mass. Appearance is concerning for possible neoplasm.  Most recent lab results (11/26/17) of BMP revealed all values WNL except for Glucose at 104, Creatinine at 1.30, Calcium at 8.7, GFR at 59 11/26/17 Hemoglobin value at 13.6.   On review of systems, pt reports stable energy levels, left testicular swelling, and denies fevers, chills, night sweats, unexpected weight loss, CP, SOB, back pains, flank pains, abdominal pains, leg swelling, testicular pain, and any other symptoms.   On PMHx the pt reports CAD, two heart stents placed in 2016. On Social Hx the pt denies smoking.  On Family Hx the pt reports mother with lymphoma of the breast. Two sisters with breast cancer, one at age 18.  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 The patient's last visit with [place] was on 06/28/18. The  pt reports that he is doing well overall.  The pt reports that he has not developed any new concerns. He denies vomiting and diarrhea, abdominal pains, night sweats, mouth sores, or concerns for infections. He notes that he has continued to be active with walking every day. He notes that he has had stable energy levels and has been eating well. He denies any problems tolerating the chemotherapy and is ready for his last planned cycle today.  Lab results today (07/19/18) of CBC w/diff and CMP is as follows: all  values are WNL except for RBC at 3.92, HGB at 12.1, HCT at 37.5, Glucose at 118, Calcium at 8.2, Total Protein at 6.1, Albumin at 3.2. 07/19/18 Magnesium at 1.8  On review of systems, pt reports stable energy levels, eating well, stable weight, and denies nausea, vomiting diarrhea, mouth sores, fevers, chills, night sweats, concerns for infections, SOB, leg swelling, abdominal pains, and any other symptoms.    MEDICAL HISTORY:  Past Medical History:  Diagnosis Date  . Arthritis   . Coronary artery disease   . HOH (hard of hearing)   . Hyperlipidemia   . Hypertension   . Ischemic cardiomyopathy   . Myocardial infarction ([place]) 2016  . Quadriceps tendon rupture    LEFT    SURGICAL HISTORY: Past Surgical History:  Procedure Laterality Date  . BIOPSY  04/27/2018   Procedure: BIOPSY;  Surgeon: Ronnette Juniper, MD;  Location: [facility];  Service: Gastroenterology;;  . CARDIAC CATHETERIZATION N/A 10/15/2014   Procedure: Left Heart Cath and Coronary Angiography;  Surgeon: Charolette Forward, MD;  Location: [facility];  Service: Cardiovascular;  Laterality: N/A;  . COLONOSCOPY    . ESOPHAGOGASTRODUODENOSCOPY (EGD) WITH PROPOFOL N/A 04/27/2018   Procedure: ESOPHAGOGASTRODUODENOSCOPY (EGD) WITH PROPOFOL;  Surgeon: Ronnette Juniper, MD;  Location: [facility];  Service: Gastroenterology;  Laterality: N/A;  . IR IMAGING GUIDED PORT INSERTION  03/14/2018  . MICROLARYNGOSCOPY WITH CO2 LASER AND EXCISION OF VOCAL CORD LESION N/A 11/26/2017   Procedure: MICROLARYNGOSCOPY WITH CO2 LASER AND EXCISION OF VOCAL CORD LESION WITH JET VENTILATION;  Surgeon: Melida Quitter, MD;  Location: [facility];  Service: ENT;  Laterality: N/A;  . QUADRICEPS TENDON REPAIR Left 02/20/2014   Procedure: LEFT REPAIR QUADRICEP TENDON;  Surgeon: Mauri Pole, MD;  Location: [facility];  Service: Orthopedics;  Laterality: Left;  . THROAT SURGERY  2010    SOCIAL HISTORY: Social History   Socioeconomic History  . Marital status: Divorced     Spouse name: Not on file  . Number of children: Not on file  . Years of education: Not on file  . Highest education level: Not on file  Occupational History  . Not on file  Social Needs  . Financial resource strain: Not on file  . Food insecurity:    Worry: Not on file    Inability: Not on file  . Transportation needs:    Medical: Not on file    Non-medical: Not on file  Tobacco Use  . Smoking status: Never Smoker  . Smokeless tobacco: Never Used  Substance and Sexual Activity  . Alcohol use: Not Currently  . Drug use: No  . Sexual activity: Not on file  Lifestyle  . Physical activity:    Days per week: Not on file    Minutes per session: Not on file  . Stress: Not on file  Relationships  . Social connections:    Talks on phone: Not on file    Gets together:  Not on file    Attends religious service: Not on file    Active member of club or organization: Not on file    Attends meetings of clubs or organizations: Not on file    Relationship status: Not on file  . Intimate partner violence:    Fear of current or ex partner: Not on file    Emotionally abused: Not on file    Physically abused: Not on file    Forced sexual activity: Not on file  Other Topics Concern  . Not on file  Social History Narrative  . Not on file    FAMILY HISTORY: No family history on file.  ALLERGIES:  has No Known Allergies.  MEDICATIONS:  Current Outpatient Medications  Medication Sig Dispense Refill  . aspirin EC 81 MG tablet Take 81 mg by mouth daily.    Marland Kitchen atorvastatin (LIPITOR) 40 MG tablet Take 1 tablet (40 mg total) by mouth daily at 6 PM. 60 tablet 3  . carvedilol (COREG) 6.25 MG tablet Take 6.25 mg by mouth 2 (two) times daily with a meal.    . cholecalciferol (VITAMIN D) 1000 UNITS tablet Take 1,000 Units by mouth daily.    Marland Kitchen losartan (COZAAR) 25 MG tablet Take 25 mg by mouth every evening.    . Multiple Vitamin (MULTIVITAMIN WITH MINERALS) TABS tablet Take 1 tablet by mouth  daily.    . nitroGLYCERIN (NITROSTAT) 0.4 MG SL tablet Place 1 tablet (0.4 mg total) under the tongue every 5 (five) minutes x 3 doses as needed for chest pain. 25 tablet 12  . ondansetron (ZOFRAN) 8 MG tablet Take 1 tablet (8 mg total) by mouth 2 (two) times daily as needed for refractory nausea / vomiting. Start on day 3 after cyclophosphamide chemotherapy. 30 tablet 1  . Polyethyl Glycol-Propyl Glycol (SYSTANE OP) Place 1 drop into both eyes at bedtime.     . polyethylene glycol (MIRALAX) packet Take 17 g by mouth daily. 30 each 1  . predniSONE (DELTASONE) 20 MG tablet Take 3 tablets (60 mg total) by mouth daily. Take on days 1-5 of chemotherapy. 15 tablet 5  . prochlorperazine (COMPAZINE) 10 MG tablet Take 1 tablet (10 mg total) by mouth every 6 (six) hours as needed (Nausea or vomiting). 30 tablet 6  . senna-docusate (SENNA S) 8.6-50 MG tablet Take 2 tablets by mouth at bedtime as needed for mild constipation or moderate constipation. 60 tablet 1  . white petrolatum (VASELINE) GEL Apply 1 application topically as needed for dry skin (itching).    . pantoprazole (PROTONIX) 40 MG tablet Take 1 tablet (40 mg total) by mouth 2 (two) times daily for 30 days. 60 tablet 0   No current facility-administered medications for this visit.     REVIEW OF SYSTEMS:    A 10+ POINT REVIEW OF SYSTEMS WAS OBTAINED including neurology, dermatology, psychiatry, cardiac, respiratory, lymph, extremities, GI, GU, Musculoskeletal, constitutional, breasts, reproductive, HEENT.  All pertinent positives are noted in the HPI.  All others are negative.   PHYSICAL EXAMINATION: ECOG PERFORMANCE STATUS: 2 - Symptomatic, <50% confined to bed  Vitals:   07/19/18 1009  BP: 109/79  Pulse: 60  Resp: 18  Temp: 98.9 F (37.2 C)  SpO2: 100%   Filed Weights   07/19/18 1009  Weight: 243 lb 4.8 oz (110.4 kg)   .Body mass index is 32.1 kg/m.  GENERAL:alert, in no acute distress and comfortable SKIN: no acute rashes, no  significant lesions EYES: conjunctiva are pink  and non-injected, sclera anicteric OROPHARYNX: MMM, no exudates, no oropharyngeal erythema or ulceration NECK: supple, no JVD LYMPH:  no palpable lymphadenopathy in the cervical, axillary or inguinal regions LUNGS: clear to auscultation b/l with normal respiratory effort HEART: regular rate & rhythm ABDOMEN:  normoactive bowel sounds , non tender, not distended. No palpable hepatosplenomegaly.  Extremity: no pedal edema PSYCH: alert & oriented x 3 with fluent speech NEURO: no focal motor/sensory deficits   LABORATORY DATA:  I have reviewed the data as listed  . CBC Latest Ref Rng & Units 07/19/2018 06/28/2018 06/07/2018  WBC 4.0 - 10.5 K/uL 6.3 5.9 6.4  Hemoglobin 13.0 - 17.0 g/dL 12.1(L) 13.0 13.5  Hematocrit 39.0 - 52.0 % 37.5(L) 40.8 41.4  Platelets 150 - 400 K/uL 217 199 209    . CMP Latest Ref Rng & Units 07/19/2018 06/28/2018 06/07/2018  Glucose 70 - 99 mg/dL 118(H) 106(H) 101(H)  BUN 8 - 23 mg/dL 12 16 11   Creatinine 0.61 - 1.24 mg/dL 1.03 1.02 1.09  Sodium 135 - 145 mmol/L 139 141 140  Potassium 3.5 - 5.1 mmol/L 4.0 4.1 4.7  Chloride 98 - 111 mmol/L 107 108 107  CO2 22 - 32 mmol/L 24 26 25   Calcium 8.9 - 10.3 mg/dL 8.2(L) 8.6(L) 8.9  Total Protein 6.5 - 8.1 g/dL 6.1(L) 6.3(L) 6.7  Total Bilirubin 0.3 - 1.2 mg/dL 0.7 0.5 0.6  Alkaline Phos 38 - 126 U/L 77 85 90  AST 15 - 41 U/L 18 20 20   ALT 0 - 44 U/L 16 20 17    01/05/18 Pathology:    RADIOGRAPHIC STUDIES: I have personally reviewed the radiological images as listed and agreed with the findings in the report. No results found.  ASSESSMENT & PLAN:   78 y.o. male with  1. Primary Testicular Diffuse Large B-Cell Lymphoma, Stage 1E 11/30/16 NM Myocar Multi w/spect w/wall motion which revealed a LV EF of 44%  12/08/17 US Scrotum revealed Abnormal appearance of the left testicle which is larger than the right and diffusely heterogeneous in echotexture. Cannot exclude  infiltrating process/mass. Appearance is concerning for possible neoplasm.  01/05/18 Left testes biopsy revealed Primary Testicular Diffuse Large B-Cell Lymphoma   01/25/18 Hep B, Hep C and HIV negative  01/31/18 ECHO revealed a LV EF of 51%. Left ventricle: The cavity size was normal. Wall motion was normal; there were no regional wall motion abnormalities. Atrial septum: No defect or patent foramen ovale was identified.  02/07/18 PET/CT revealed No findings for metabolically active lymphoma involving the neck, chest, abdomen/pelvis or osseous structures.  Due to admission after first intrathecal methotrexate, concern for brain toxicity given patient's age, and concern for treatment tolerance, decided to hold IT MTX. No further concerns for bleeding, nausea, or vomiting.  PLAN -Discussed pt labwork today, 07/19/18; blood counts and chemistries are stable. Magnesium normal at 1.8. -The pt has no prohibitive toxicities from continuing C6 R-mini-CHOP with G-CSF support at this time. -chemotherapy orders placed, reviewed and signed. -Will order PET/CT in 4-6 weeks, and if results are stable, will refer pt to Rad Onc for RT to reduce risk of local recurrence in testes -Continue Senna S two tablets at night time, and Miralax -Continue with infection prevention strategies with the pt including crowd avoidance and wearing a mask while in public -Recommended that the pt continue to eat well, drink at least 48-64 oz of water each day, and walk 20-30 minutes each day. -Previously recommended that the pt obtain a power of attorney -  Will see the pt back in 6 weeks   2.  Past Medical History:  Diagnosis Date  . Arthritis   . Coronary artery disease   . HOH (hard of hearing)   . Hyperlipidemia   . Hypertension   . Ischemic cardiomyopathy   . Myocardial infarction ([place]) 2016  . Quadriceps tendon rupture    LEFT  -continue f/u with PCP    -PET/CT in 5 weeks -RTC with Dr Irene Limbo in 6 weeks with  labs -Radiation Oncology referral for prophylactic testicular radiation for primary testicular large b cell lymphoma (schedule after PET/CT and f/u visit with Dr Irene Limbo)   All of the patients questions were answered with apparent satisfaction. The patient knows to call the clinic with any problems, questions or concerns.  The total time spent in the appt was 25 minutes and more than 50% was on counseling and direct patient cares.    Sullivan Lone MD MS AAHIVMS [facility] Hematology/Oncology Physician [facility]  (Office):       [phone] (Work cell):  [phone] (Fax):           [phone]  07/19/2018 10:44 AM  I, Baldwin Jamaica, am acting as a scribe for Dr. Sullivan Lone.   .I have reviewed the above documentation for accuracy and completeness, and I agree with the above. Brunetta Genera MD

## 2018-07-19 ENCOUNTER — Ambulatory Visit: Payer: Medicare HMO

## 2018-07-19 ENCOUNTER — Inpatient Hospital Stay: Payer: Medicare HMO

## 2018-07-19 ENCOUNTER — Inpatient Hospital Stay (HOSPITAL_BASED_OUTPATIENT_CLINIC_OR_DEPARTMENT_OTHER): Payer: Medicare HMO | Admitting: Hematology

## 2018-07-19 ENCOUNTER — Other Ambulatory Visit: Payer: Self-pay

## 2018-07-19 ENCOUNTER — Inpatient Hospital Stay: Payer: Medicare HMO | Attending: Hematology

## 2018-07-19 ENCOUNTER — Telehealth: Payer: Self-pay | Admitting: Hematology

## 2018-07-19 VITALS — BP 109/79 | HR 60 | Temp 98.9°F | Resp 18 | Ht 73.0 in | Wt 243.3 lb

## 2018-07-19 VITALS — BP 98/67 | HR 74 | Temp 97.9°F | Resp 16

## 2018-07-19 DIAGNOSIS — Z79899 Other long term (current) drug therapy: Secondary | ICD-10-CM | POA: Diagnosis not present

## 2018-07-19 DIAGNOSIS — Z7982 Long term (current) use of aspirin: Secondary | ICD-10-CM | POA: Diagnosis not present

## 2018-07-19 DIAGNOSIS — Z95828 Presence of other vascular implants and grafts: Secondary | ICD-10-CM

## 2018-07-19 DIAGNOSIS — Z5189 Encounter for other specified aftercare: Secondary | ICD-10-CM | POA: Diagnosis not present

## 2018-07-19 DIAGNOSIS — C8339 Diffuse large B-cell lymphoma, extranodal and solid organ sites: Secondary | ICD-10-CM

## 2018-07-19 DIAGNOSIS — Z5111 Encounter for antineoplastic chemotherapy: Secondary | ICD-10-CM | POA: Insufficient documentation

## 2018-07-19 DIAGNOSIS — E785 Hyperlipidemia, unspecified: Secondary | ICD-10-CM | POA: Insufficient documentation

## 2018-07-19 DIAGNOSIS — Z5112 Encounter for antineoplastic immunotherapy: Secondary | ICD-10-CM | POA: Insufficient documentation

## 2018-07-19 DIAGNOSIS — C8335 Diffuse large B-cell lymphoma, lymph nodes of inguinal region and lower limb: Secondary | ICD-10-CM

## 2018-07-19 LAB — CMP (CANCER CENTER ONLY)
ALT: 16 U/L (ref 0–44)
AST: 18 U/L (ref 15–41)
Albumin: 3.2 g/dL — ABNORMAL LOW (ref 3.5–5.0)
Alkaline Phosphatase: 77 U/L (ref 38–126)
Anion gap: 8 (ref 5–15)
BUN: 12 mg/dL (ref 8–23)
CO2: 24 mmol/L (ref 22–32)
Calcium: 8.2 mg/dL — ABNORMAL LOW (ref 8.9–10.3)
Chloride: 107 mmol/L (ref 98–111)
Creatinine: 1.03 mg/dL (ref 0.61–1.24)
GFR, Est AFR Am: >60 mL/min (ref >60)
GFR, Estimated: >60 mL/min (ref >60)
Glucose, Bld: 118 mg/dL — ABNORMAL HIGH (ref 70–99)
Potassium: 4 mmol/L (ref 3.5–5.1)
Sodium: 139 mmol/L (ref 135–145)
Total Bilirubin: 0.7 mg/dL (ref 0.3–1.2)
Total Protein: 6.1 g/dL — ABNORMAL LOW (ref 6.5–8.1)

## 2018-07-19 LAB — CBC WITH DIFFERENTIAL/PLATELET
Abs Immature Granulocytes: 0.07 10*3/uL (ref 0.00–0.07)
Basophils Absolute: 0.1 10*3/uL (ref 0.0–0.1)
Basophils Relative: 1 %
Eosinophils Absolute: 0.1 10*3/uL (ref 0.0–0.5)
Eosinophils Relative: 2 %
HCT: 37.5 % — ABNORMAL LOW (ref 39.0–52.0)
Hemoglobin: 12.1 g/dL — ABNORMAL LOW (ref 13.0–17.0)
Immature Granulocytes: 1 %
Lymphocytes Relative: 16 %
Lymphs Abs: 1 10*3/uL (ref 0.7–4.0)
MCH: 30.9 pg (ref 26.0–34.0)
MCHC: 32.3 g/dL (ref 30.0–36.0)
MCV: 95.7 fL (ref 80.0–100.0)
Monocytes Absolute: 0.7 10*3/uL (ref 0.1–1.0)
Monocytes Relative: 11 %
Neutro Abs: 4.3 10*3/uL (ref 1.7–7.7)
Neutrophils Relative %: 69 %
Platelets: 217 10*3/uL (ref 150–400)
RBC: 3.92 MIL/uL — ABNORMAL LOW (ref 4.22–5.81)
RDW: 13 % (ref 11.5–15.5)
WBC: 6.3 10*3/uL (ref 4.0–10.5)
nRBC: 0 % (ref 0.0–0.2)

## 2018-07-19 LAB — MAGNESIUM: Magnesium: 1.8 mg/dL (ref 1.7–2.4)

## 2018-07-19 MED ORDER — VINCRISTINE SULFATE CHEMO INJECTION 1 MG/ML
1.0000 mg | Freq: Once | INTRAVENOUS | Status: AC
  Administered 2018-07-19: 1 mg via INTRAVENOUS
  Filled 2018-07-19: qty 1

## 2018-07-19 MED ORDER — ACETAMINOPHEN 325 MG PO TABS
ORAL_TABLET | ORAL | Status: AC
  Filled 2018-07-19: qty 2

## 2018-07-19 MED ORDER — SODIUM CHLORIDE 0.9 % IV SOLN
375.0000 mg/m2 | Freq: Once | INTRAVENOUS | Status: AC
  Administered 2018-07-19: 900 mg via INTRAVENOUS
  Filled 2018-07-19: qty 50

## 2018-07-19 MED ORDER — PALONOSETRON HCL INJECTION 0.25 MG/5ML
INTRAVENOUS | Status: AC
  Filled 2018-07-19: qty 5

## 2018-07-19 MED ORDER — HEPARIN SOD (PORK) LOCK FLUSH 100 UNIT/ML IV SOLN
500.0000 [IU] | Freq: Once | INTRAVENOUS | Status: AC | PRN
  Administered 2018-07-19: 500 [IU]
  Filled 2018-07-19: qty 5

## 2018-07-19 MED ORDER — SODIUM CHLORIDE 0.9% FLUSH
10.0000 mL | INTRAVENOUS | Status: DC | PRN
  Administered 2018-07-19: 10 mL
  Filled 2018-07-19: qty 10

## 2018-07-19 MED ORDER — DIPHENHYDRAMINE HCL 25 MG PO CAPS
ORAL_CAPSULE | ORAL | Status: AC
  Filled 2018-07-19: qty 2

## 2018-07-19 MED ORDER — PALONOSETRON HCL INJECTION 0.25 MG/5ML
0.2500 mg | Freq: Once | INTRAVENOUS | Status: AC
  Administered 2018-07-19: 0.25 mg via INTRAVENOUS

## 2018-07-19 MED ORDER — ACETAMINOPHEN 325 MG PO TABS
650.0000 mg | ORAL_TABLET | Freq: Once | ORAL | Status: AC
  Administered 2018-07-19: 650 mg via ORAL

## 2018-07-19 MED ORDER — DIPHENHYDRAMINE HCL 25 MG PO CAPS
50.0000 mg | ORAL_CAPSULE | Freq: Once | ORAL | Status: AC
  Administered 2018-07-19: 50 mg via ORAL

## 2018-07-19 MED ORDER — SODIUM CHLORIDE 0.9 % IV SOLN
400.0000 mg/m2 | Freq: Once | INTRAVENOUS | Status: AC
  Administered 2018-07-19: 13:00:00 940 mg via INTRAVENOUS
  Filled 2018-07-19: qty 47

## 2018-07-19 MED ORDER — SODIUM CHLORIDE 0.9 % IV SOLN
Freq: Once | INTRAVENOUS | Status: AC
  Administered 2018-07-19: 11:00:00 via INTRAVENOUS
  Filled 2018-07-19: qty 250

## 2018-07-19 MED ORDER — DOXORUBICIN HCL CHEMO IV INJECTION 2 MG/ML
25.0000 mg/m2 | Freq: Once | INTRAVENOUS | Status: AC
  Administered 2018-07-19: 58 mg via INTRAVENOUS
  Filled 2018-07-19: qty 29

## 2018-07-19 MED ORDER — SODIUM CHLORIDE 0.9 % IV SOLN
Freq: Once | INTRAVENOUS | Status: AC
  Administered 2018-07-19: 11:00:00 via INTRAVENOUS
  Filled 2018-07-19: qty 5

## 2018-07-19 NOTE — Progress Notes (Signed)
Pt. Here today for port access and labs, Pt.'s blood flow from port was not sufficient port was flushed multiple  Times and repositioned blood flows when pt. Is asked to take a deep breathe.

## 2018-07-19 NOTE — Patient Instructions (Signed)
[facility] Discharge Instructions for Patients Receiving Chemotherapy  Today you received the following chemotherapy agents: Adriamycin (Doxorubicin), Vincristine (Oncovin), Cyclophosphamide (Cytoxan), Rituximab (Rituxan)  To help prevent nausea and vomiting after your treatment, we encourage you to take your nausea medication as directed. Received Aloxi during treatment today-->Take your Compazine prescription (not Zofran) for the next 3 days as needed.   If you develop nausea and vomiting that is not controlled by your nausea medication, call the clinic.   BELOW ARE SYMPTOMS THAT SHOULD BE REPORTED IMMEDIATELY:  *FEVER GREATER THAN 100.5 F  *CHILLS WITH OR WITHOUT FEVER  NAUSEA AND VOMITING THAT IS NOT CONTROLLED WITH YOUR NAUSEA MEDICATION  *UNUSUAL SHORTNESS OF BREATH  *UNUSUAL BRUISING OR BLEEDING  TENDERNESS IN MOUTH AND THROAT WITH OR WITHOUT PRESENCE OF ULCERS  *URINARY PROBLEMS  *BOWEL PROBLEMS  UNUSUAL RASH Items with * indicate a potential emergency and should be followed up as soon as possible.  Feel free to call the clinic should you have any questions or concerns. The clinic phone number is [phone].  Please show the [facility] at check-in to the Emergency Department and triage nurse.

## 2018-07-19 NOTE — Telephone Encounter (Signed)
Scheduled appt per 6/9 los.  Patient will get appt calendar in treatment.

## 2018-07-21 ENCOUNTER — Other Ambulatory Visit: Payer: Self-pay

## 2018-07-21 ENCOUNTER — Inpatient Hospital Stay: Payer: Medicare HMO

## 2018-07-21 VITALS — BP 108/77 | HR 72 | Temp 98.2°F | Resp 18

## 2018-07-21 DIAGNOSIS — Z5112 Encounter for antineoplastic immunotherapy: Secondary | ICD-10-CM | POA: Diagnosis not present

## 2018-07-21 DIAGNOSIS — C8339 Diffuse large B-cell lymphoma, extranodal and solid organ sites: Secondary | ICD-10-CM

## 2018-07-21 DIAGNOSIS — Z79899 Other long term (current) drug therapy: Secondary | ICD-10-CM | POA: Diagnosis not present

## 2018-07-21 DIAGNOSIS — Z5111 Encounter for antineoplastic chemotherapy: Secondary | ICD-10-CM | POA: Diagnosis not present

## 2018-07-21 DIAGNOSIS — Z7982 Long term (current) use of aspirin: Secondary | ICD-10-CM | POA: Diagnosis not present

## 2018-07-21 DIAGNOSIS — Z5189 Encounter for other specified aftercare: Secondary | ICD-10-CM | POA: Diagnosis not present

## 2018-07-21 DIAGNOSIS — C8335 Diffuse large B-cell lymphoma, lymph nodes of inguinal region and lower limb: Secondary | ICD-10-CM | POA: Diagnosis not present

## 2018-07-21 DIAGNOSIS — E785 Hyperlipidemia, unspecified: Secondary | ICD-10-CM | POA: Diagnosis not present

## 2018-07-21 MED ORDER — PEGFILGRASTIM-CBQV 6 MG/0.6ML ~~LOC~~ SOSY
6.0000 mg | PREFILLED_SYRINGE | Freq: Once | SUBCUTANEOUS | Status: AC
  Administered 2018-07-21: 6 mg via SUBCUTANEOUS

## 2018-07-21 MED ORDER — PEGFILGRASTIM-CBQV 6 MG/0.6ML ~~LOC~~ SOSY
PREFILLED_SYRINGE | SUBCUTANEOUS | Status: AC
  Filled 2018-07-21: qty 0.6

## 2018-07-21 NOTE — Patient Instructions (Signed)
Pegfilgrastim injection  What is this medicine?  PEGFILGRASTIM (PEG fil gra stim) is a long-acting granulocyte colony-stimulating factor that stimulates the growth of neutrophils, a type of white blood cell important in the body's fight against infection. It is used to reduce the incidence of fever and infection in patients with certain types of cancer who are receiving chemotherapy that affects the bone marrow, and to increase survival after being exposed to high doses of radiation.  This medicine may be used for other purposes; ask your health care provider or pharmacist if you have questions.  COMMON BRAND NAME(S): Fulphila, Neulasta, UDENYCA  What should I tell my health care provider before I take this medicine?  They need to know if you have any of these conditions:  -kidney disease  -latex allergy  -ongoing radiation therapy  -sickle cell disease  -skin reactions to acrylic adhesives (On-Body Injector only)  -an unusual or allergic reaction to pegfilgrastim, filgrastim, other medicines, foods, dyes, or preservatives  -pregnant or trying to get pregnant  -breast-feeding  How should I use this medicine?  This medicine is for injection under the skin. If you get this medicine at home, you will be taught how to prepare and give the pre-filled syringe or how to use the On-body Injector. Refer to the patient Instructions for Use for detailed instructions. Use exactly as directed. Tell your healthcare provider immediately if you suspect that the On-body Injector may not have performed as intended or if you suspect the use of the On-body Injector resulted in a missed or partial dose.  It is important that you put your used needles and syringes in a special sharps container. Do not put them in a trash can. If you do not have a sharps container, call your pharmacist or healthcare provider to get one.  Talk to your pediatrician regarding the use of this medicine in children. While this drug may be prescribed for  selected conditions, precautions do apply.  Overdosage: If you think you have taken too much of this medicine contact a poison control center or emergency room at once.  NOTE: This medicine is only for you. Do not share this medicine with others.  What if I miss a dose?  It is important not to miss your dose. Call your doctor or health care professional if you miss your dose. If you miss a dose due to an On-body Injector failure or leakage, a new dose should be administered as soon as possible using a single prefilled syringe for manual use.  What may interact with this medicine?  Interactions have not been studied.  Give your health care provider a list of all the medicines, herbs, non-prescription drugs, or dietary supplements you use. Also tell them if you smoke, drink alcohol, or use illegal drugs. Some items may interact with your medicine.  This list may not describe all possible interactions. Give your health care provider a list of all the medicines, herbs, non-prescription drugs, or dietary supplements you use. Also tell them if you smoke, drink alcohol, or use illegal drugs. Some items may interact with your medicine.  What should I watch for while using this medicine?  You may need blood work done while you are taking this medicine.  If you are going to need a MRI, CT scan, or other procedure, tell your doctor that you are using this medicine (On-Body Injector only).  What side effects may I notice from receiving this medicine?  Side effects that you should report to   your doctor or health care professional as soon as possible:  -allergic reactions like skin rash, itching or hives, swelling of the face, lips, or tongue  -back pain  -dizziness  -fever  -pain, redness, or irritation at site where injected  -pinpoint red spots on the skin  -red or dark-brown urine  -shortness of breath or breathing problems  -stomach or side pain, or pain at the shoulder  -swelling  -tiredness  -trouble passing urine or  change in the amount of urine  Side effects that usually do not require medical attention (report to your doctor or health care professional if they continue or are bothersome):  -bone pain  -muscle pain  This list may not describe all possible side effects. Call your doctor for medical advice about side effects. You may report side effects to FDA at 1-800-FDA-1088.  Where should I keep my medicine?  Keep out of the reach of children.  If you are using this medicine at home, you will be instructed on how to store it. Throw away any unused medicine after the expiration date on the label.  NOTE: This sheet is a summary. It may not cover all possible information. If you have questions about this medicine, talk to your doctor, pharmacist, or health care provider.   2019 Elsevier/Gold Standard (2017-05-03 16:57:08)

## 2018-08-11 DIAGNOSIS — I252 Old myocardial infarction: Secondary | ICD-10-CM | POA: Diagnosis not present

## 2018-08-11 DIAGNOSIS — I255 Ischemic cardiomyopathy: Secondary | ICD-10-CM | POA: Diagnosis not present

## 2018-08-11 DIAGNOSIS — R7303 Prediabetes: Secondary | ICD-10-CM | POA: Diagnosis not present

## 2018-08-11 DIAGNOSIS — I10 Essential (primary) hypertension: Secondary | ICD-10-CM | POA: Diagnosis not present

## 2018-08-11 DIAGNOSIS — I2510 Atherosclerotic heart disease of native coronary artery without angina pectoris: Secondary | ICD-10-CM | POA: Diagnosis not present

## 2018-08-11 DIAGNOSIS — M1990 Unspecified osteoarthritis, unspecified site: Secondary | ICD-10-CM | POA: Diagnosis not present

## 2018-08-11 DIAGNOSIS — E785 Hyperlipidemia, unspecified: Secondary | ICD-10-CM | POA: Diagnosis not present

## 2018-08-19 NOTE — Progress Notes (Signed)
Lymphoma Location(s) / Histology:  01/05/18 Diagnosis Testis, tumor, left - DIFFUSE LARGE B-CELL LYMPHOMA  Carl [name] presented with symptoms of: He noted that his left testicle was slightly larger than a golf ball. It was not painful but he brought it to the attention of his provider.   Biopsies of left testis revealed: Diffuse large B-cell lymphoma.    Past/Anticipated interventions by medical oncology, if any:  Dr. Irene Limbo 07/19/18 PLAN -Discussed pt labwork today, 07/19/18; blood counts and chemistries are stable. Magnesium normal at 1.8. -The pt has no prohibitive toxicities from continuing C6 R-mini-CHOP with G-CSF support at this time. -chemotherapy orders placed, reviewed and signed. -Will order PET/CT in 4-6 weeks, and if results are stable, will refer pt to Rad Onc for RT to reduce risk of local recurrence in testes -Continue Senna S two tablets at night time, and Miralax -Continue with infection prevention strategies with the pt including crowd avoidance and wearing a mask while in public -Recommended that the pt continue to eat well, drink at least 48-64 oz of water each day, and walk 20-30 minutes each day. -Previously recommended that the pt obtain a power of attorney -Will see the pt back in 6 weeks   Weight changes, if any, over the past 6 months: He denies.   Recurrent fevers, or drenching night sweats, if any: He denies.   SAFETY ISSUES:  Prior radiation? No  Pacemaker/ICD? No  Possible current pregnancy? N/A  Is the patient on methotrexate? No  Current Complaints / other details:   PET completed 08/23/18

## 2018-08-23 ENCOUNTER — Encounter (HOSPITAL_COMMUNITY)
Admission: RE | Admit: 2018-08-23 | Discharge: 2018-08-23 | Disposition: A | Discharge status: Home / Self Care | Payer: Medicare HMO | Source: Ambulatory Visit | Attending: Hematology | Admitting: Hematology

## 2018-08-23 ENCOUNTER — Other Ambulatory Visit: Payer: Self-pay

## 2018-08-23 DIAGNOSIS — Z5111 Encounter for antineoplastic chemotherapy: Secondary | ICD-10-CM | POA: Diagnosis not present

## 2018-08-23 DIAGNOSIS — Z79899 Other long term (current) drug therapy: Secondary | ICD-10-CM | POA: Insufficient documentation

## 2018-08-23 DIAGNOSIS — C8339 Diffuse large B-cell lymphoma, extranodal and solid organ sites: Secondary | ICD-10-CM | POA: Diagnosis not present

## 2018-08-23 LAB — GLUCOSE, CAPILLARY: Glucose-Capillary: 101 mg/dL — ABNORMAL HIGH (ref 70–99)

## 2018-08-23 MED ORDER — FLUDEOXYGLUCOSE F - 18 (FDG) INJECTION
11.9800 | Freq: Once | INTRAVENOUS | Status: AC | PRN
  Administered 2018-08-23: 11.98 via INTRAVENOUS

## 2018-08-24 ENCOUNTER — Ambulatory Visit
Admission: RE | Admit: 2018-08-24 | Discharge: 2018-08-24 | Disposition: A | Discharge status: Home / Self Care | Payer: Medicare HMO | Source: Ambulatory Visit | Attending: Radiation Oncology | Admitting: Radiation Oncology

## 2018-08-24 ENCOUNTER — Other Ambulatory Visit: Payer: Self-pay

## 2018-08-24 ENCOUNTER — Encounter: Payer: Self-pay | Admitting: Radiation Oncology

## 2018-08-24 DIAGNOSIS — Z8572 Personal history of non-Hodgkin lymphomas: Secondary | ICD-10-CM | POA: Diagnosis not present

## 2018-08-24 DIAGNOSIS — C8339 Diffuse large B-cell lymphoma, extranodal and solid organ sites: Secondary | ICD-10-CM

## 2018-08-24 DIAGNOSIS — C83398 Diffuse large b-cell lymphoma of other extranodal and solid organ sites: Secondary | ICD-10-CM

## 2018-08-26 ENCOUNTER — Encounter: Payer: Self-pay | Admitting: Radiation Oncology

## 2018-08-26 ENCOUNTER — Other Ambulatory Visit: Payer: Self-pay

## 2018-08-26 ENCOUNTER — Ambulatory Visit
Admission: RE | Admit: 2018-08-26 | Discharge: 2018-08-26 | Disposition: A | Discharge status: Home / Self Care | Payer: Medicare HMO | Source: Ambulatory Visit | Attending: Radiation Oncology | Admitting: Radiation Oncology

## 2018-08-26 DIAGNOSIS — Z510 Encounter for antineoplastic radiation therapy: Secondary | ICD-10-CM | POA: Diagnosis not present

## 2018-08-26 DIAGNOSIS — C8339 Diffuse large B-cell lymphoma, extranodal and solid organ sites: Secondary | ICD-10-CM | POA: Diagnosis not present

## 2018-08-26 NOTE — Progress Notes (Addendum)
Radiation Oncology         (336) [phone] ________________________________  Initial outpatient Consultation by phone- the patient was not able to access WebEx today  Name: Carl [name] MRN: [id number]  Date: 08/24/2018  DOB: May 28, 1940  WE:XHBZ, Dwyane Luo, MD  Brunetta Genera, MD   REFERRING PHYSICIAN: Brunetta Genera, MD  DIAGNOSIS:    ICD-10-CM   1. 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 male who presented with a left testicle slightly larger than a golf ball. It was not painful but he brought it to the attention of his provider.  Ultrasound of the scrotum showed an abnormal appearance of the left testicle which was larger than the right with diffuse heterogeneity  Biopsy of left testis revealed:01/05/18 - DIFFUSE LARGE B-CELL LYMPHOMA  Past/Anticipated interventions by medical oncology, if any: Due to admission after his first intrathecal methotrexate and concern for toxicity, further intrathecal methotrexate was held.  Dr. Irene Limbo delivered 6 cycles of R-mini-CHOP with G-CSF support.   Weight changes, if any, over the past 6 months: He denies.   Recurrent fevers, or drenching night sweats, if any: He denies.   SAFETY ISSUES:  Prior radiation? No  Pacemaker/ICD? No  Possible current pregnancy? N/A  Is the patient on methotrexate? No  I looked at his pretreatment and posttreatment posttreatment PET scans.  The prechemotherapy PET scan on February 07, 2018 did not show any metabolically active lymphoma.  The postchemotherapy PET scan from August 23, 2018 demonstrated nonspecific hypermetabolic activity in lymph nodes of the chest, the right middle lobe of the lung, and the right testis.  Continued observation to be considered.  No  obvious evidence of lymphoma.   PREVIOUS RADIATION THERAPY: No  PAST MEDICAL HISTORY:  has a past medical history of Arthritis, Coronary artery disease, HOH (hard of hearing), Hyperlipidemia, Hypertension, Ischemic cardiomyopathy, Myocardial infarction ([place]) (2016), and Quadriceps tendon rupture.    PAST SURGICAL HISTORY: Past Surgical History:  Procedure Laterality Date   BIOPSY  04/27/2018   Procedure: BIOPSY;  Surgeon: Ronnette Juniper, MD;  Location: [facility];  Service: Gastroenterology;;   CARDIAC CATHETERIZATION N/A 10/15/2014   Procedure: Left Heart Cath and Coronary Angiography;  Surgeon: Charolette Forward, MD;  Location: [facility];  Service: Cardiovascular;  Laterality: N/A;   COLONOSCOPY     ESOPHAGOGASTRODUODENOSCOPY (EGD) WITH PROPOFOL N/A 04/27/2018   Procedure: ESOPHAGOGASTRODUODENOSCOPY (EGD) WITH PROPOFOL;  Surgeon: Ronnette Juniper, MD;  Location: [facility];  Service: Gastroenterology;  Laterality: N/A;   IR IMAGING GUIDED PORT INSERTION  03/14/2018   MICROLARYNGOSCOPY WITH CO2 LASER AND EXCISION OF VOCAL CORD LESION N/A 11/26/2017   Procedure: MICROLARYNGOSCOPY WITH CO2 LASER AND EXCISION OF VOCAL CORD LESION WITH JET VENTILATION;  Surgeon: Melida Quitter, MD;  Location: [facility];  Service: ENT;  Laterality: N/A;   QUADRICEPS TENDON REPAIR Left 02/20/2014   Procedure: LEFT REPAIR QUADRICEP TENDON;  Surgeon: Mauri Pole, MD;  Location: [facility];  Service: Orthopedics;  Laterality: Left;   THROAT SURGERY  2010    FAMILY HISTORY: family history is not on file.  SOCIAL HISTORY:  reports that he has never smoked. He has never used smokeless tobacco. He reports previous alcohol use. He reports that he does not use drugs.  ALLERGIES: Patient  has no known allergies.  MEDICATIONS:  Current Outpatient Medications  Medication Sig Dispense Refill   aspirin EC 81 MG tablet Take 81 mg by mouth daily.     atorvastatin (LIPITOR) 40 MG tablet Take 1 tablet (40 mg total) by mouth  daily at 6 PM. 60 tablet 3   carvedilol (COREG) 6.25 MG tablet Take 3.21 mg by mouth 2 (two) times daily with a meal.      cholecalciferol (VITAMIN D) 1000 UNITS tablet Take 1,000 Units by mouth daily. He reports 5000 units     losartan (COZAAR) 25 MG tablet Take 25 mg by mouth every evening.     Multiple Vitamin (MULTIVITAMIN WITH MINERALS) TABS tablet Take 1 tablet by mouth daily.     Polyethyl Glycol-Propyl Glycol (SYSTANE OP) Place 1 drop into both eyes at bedtime.      polyethylene glycol (MIRALAX) packet Take 17 g by mouth daily. (Patient taking differently: Take 17 g by mouth daily. He reports he takes it every other day.) 30 each 1   senna-docusate (SENNA S) 8.6-50 MG tablet Take 2 tablets by mouth at bedtime as needed for mild constipation or moderate constipation. 60 tablet 1   white petrolatum (VASELINE) GEL Apply 1 application topically as needed for dry skin (itching).     gabapentin (NEURONTIN) 100 MG capsule 1 CAPSULE BY MOUTH EVERY DAY FOR 1 WK, 1 CAP TWICE A DAY FOR 1 WK, 1 CAP 3 TIMES A DAY THEREAFTER     nitroGLYCERIN (NITROSTAT) 0.4 MG SL tablet Place 1 tablet (0.4 mg total) under the tongue every 5 (five) minutes x 3 doses as needed for chest pain. (Patient not taking: Reported on 08/24/2018) 25 tablet 12   ondansetron (ZOFRAN) 8 MG tablet Take 1 tablet (8 mg total) by mouth 2 (two) times daily as needed for refractory nausea / vomiting. Start on day 3 after cyclophosphamide chemotherapy. (Patient not taking: Reported on 08/24/2018) 30 tablet 1   pantoprazole (PROTONIX) 40 MG tablet Take 1 tablet (40 mg total) by mouth 2 (two) times daily for 30 days. 60 tablet 0   predniSONE (DELTASONE) 20 MG tablet Take 3 tablets (60 mg total) by mouth daily. Take on days 1-5 of chemotherapy. (Patient not taking: Reported on 08/24/2018) 15 tablet 5   prochlorperazine (COMPAZINE) 10 MG tablet Take 1 tablet (10 mg total) by mouth every 6 (six) hours as needed (Nausea or vomiting).  (Patient not taking: Reported on 08/24/2018) 30 tablet 6   No current facility-administered medications for this encounter.     REVIEW OF SYSTEMS:  Notable for that above.   PHYSICAL EXAM:  vitals were not taken for this visit.   NA   LABORATORY DATA:  Lab Results  Component Value Date   WBC 6.3 07/19/2018   HGB 12.1 (L) 07/19/2018   HCT 37.5 (L) 07/19/2018   MCV 95.7 07/19/2018   PLT 217 07/19/2018   CMP     Component Value Date/Time   NA 139 07/19/2018 0845   K 4.0 07/19/2018 0845   CL 107 07/19/2018 0845   CO2 24 07/19/2018 0845   GLUCOSE 118 (H) 07/19/2018 0845   BUN 12 07/19/2018 0845   CREATININE 1.03 07/19/2018 0845   CALCIUM 8.2 (L) 07/19/2018 0845   PROT 6.1 (L) 07/19/2018 0845   ALBUMIN 3.2 (L) 07/19/2018 0845   AST 18 07/19/2018 0845   ALT 16 07/19/2018 0845   ALKPHOS 77 07/19/2018 0845   BILITOT 0.7 07/19/2018 0845   GFRNONAA >  60 07/19/2018 0845   GFRAA >60 07/19/2018 0845         RADIOGRAPHY: Nm Pet Image Restag (ps) Skull Base To Thigh  Result Date: 08/23/2018 CLINICAL DATA:  Subsequent treatment strategy for primary left testicular diffuse large B-cell lymphoma status post left orchiectomy and chemotherapy. EXAM: NUCLEAR MEDICINE PET SKULL BASE TO THIGH TECHNIQUE: 12.0 mCi F-18 FDG was injected intravenously. Full-ring PET imaging was performed from the skull base to thigh after the radiotracer. CT data was obtained and used for attenuation correction and anatomic localization. Fasting blood glucose: 101 mg/dl COMPARISON:  02/07/2018 PET-CT. FINDINGS: Mediastinal blood pool activity: SUV max 2.9 Liver activity: SUV max 4.2 NECK: No hypermetabolic lymph nodes in the neck. Incidental CT findings: Mucous retention cyst versus polyp in the inferior left maxillary sinus, stable. CHEST: Mildly enlarged and mildly hypermetabolic 1.0 cm AP window node with max SUV 3.9 (series 3/image 62), previously 1.1 cm with max SUV 4.1, not appreciably changed. No new enlarged or  hypermetabolic mediastinal nodes. Mild hypermetabolism within right hilar lymph nodes with max SUV 4.3, new from prior. No hypermetabolic left hilar nodes. No enlarged or hypermetabolic axillary lymph nodes. There is low level metabolism (max SUV 2.5) associated with a new mild patchy consolidation in the medial segment right middle lobe (series 8/image 48). Incidental CT findings: Anterior left lower lobe 3 mm solid pulmonary nodule (series 8/image 31), stable. No new significant pulmonary nodules. Stable symmetric mild bilateral gynecomastia. Coronary atherosclerosis. Right internal jugular Port-A-Cath terminates at the cavoatrial junction. Atherosclerotic nonaneurysmal thoracic aorta. ABDOMEN/PELVIS: No abnormal hypermetabolic activity within the liver, pancreas, adrenal glands, or spleen. No hypermetabolic lymph nodes in the abdomen or pelvis. Patchy hypermetabolism within the right testis with max SUV 5.6, previous max SUV 3.7, increased. No discrete right testicular mass on the CT images. Left orchiectomy. Incidental CT findings: Simple 1.2 cm upper right renal cyst. Atherosclerotic nonaneurysmal abdominal aorta. Moderate diffuse colonic diverticulosis. Mild prostatomegaly. Chronic diffuse bladder wall thickening. SKELETON: No focal hypermetabolic activity to suggest skeletal metastasis. Incidental CT findings: none IMPRESSION: 1. Mildly hypermetabolic mildly enlarged AP window lymph node, stable since 02/07/2018 PET-CT. 2. Low level metabolism associated with new mild patchy consolidation in the medial segment right middle lobe, potentially inflammatory. 3. New mildly hypermetabolic right hilar lymph nodes, nonspecific, potentially reactive to the right middle lobe process. 4. Nonspecific patchy hypermetabolism within the right testis is increased. No discrete right testicular mass on the noncontrast CT images. 5. These findings are equivocal for active lymphoma. Suggest attention on follow-up PET-CT in 3-6  months. 6.  Aortic Atherosclerosis (ICD10-I70.0). Electronically Signed   By: Ilona Sorrel M.D.   On: 08/23/2018 12:18      IMPRESSION/PLAN:Today, I talked to the patient about the findings and work-up thus far. We discussed the patient's diagnosis of testicular lymphoma and general treatment for this, highlighting the role of radiotherapy in the management. We discussed the available radiation techniques, and focused on the details of logistics and delivery.    We discussed the risks, benefits, and side effects of radiotherapy. Side effects may include but not necessarily be limited to: Skin irritation, sterility/decreased sperm count are complete loss of sperm viability, hair loss in the scrotal region, fatigue . No guarantees of treatment were given.   The patient was encouraged to ask questions that I answered to the best of my ability.   We will schedule simulation in the near future.  The patient is enthusiastic to proceed with the goal of reducing  the chance of disease recurrence in the testes and scrotum.  Anticipate 3 to 4 weeks of radiotherapy.  This encounter was provided by telemedicine platform phone, which the patient opted for due to pandemic precautions; the patient was not able to access WebEx today   I also separately spoke with his daughter to update her on the patient's condition and plan. The patient has given verbal consent for this type of encounter and has been advised to only accept a meeting of this type in a secure network environment. The time spent during this encounter was 20 minutes. The attendants for this meeting include Eppie Gibson  and Artis Delay.  During the encounter, Eppie Gibson was located at [facility] Radiation Oncology Department.  Artis Delay was located at home.    __________________________________________   Eppie Gibson, MD

## 2018-08-26 NOTE — Addendum Note (Signed)
Encounter addended by: Eppie Gibson, MD on: 08/26/2018 8:29 AM  Actions taken: Clinical Note Signed

## 2018-08-26 NOTE — Progress Notes (Signed)
  Radiation Oncology         [phone] ________________________________  Name: Carl [name] MRN: [id number]  Date: 08/26/2018  DOB: Mar 31, 1940  SIMULATION AND TREATMENT PLANNING NOTE  Outpatient  DIAGNOSIS:     ICD-10-CM   1. 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  Identity was confirmed.  All relevant records and images related to the planned course of therapy were reviewed.  The patient freely provided informed written consent to proceed with treatment after reviewing the details related to the planned course of therapy. The consent form was witnessed and verified by the simulation staff.    Then, the patient was set-up in a stable reproducible  supine position for radiation therapy, frog legged, in a Vaclock. Custom bolus was placed on his scrotum and an Accuform device was custom made to position beneath his scrotum.  CT images were obtained.  Surface markings were placed.  The CT images were loaded into the planning software.    TREATMENT PLANNING NOTE: Treatment planning then occurred.  The radiation prescription was entered and confirmed.    A total of 4 medically necessary complex treatment devices were fabricated and supervised by me, in the form of Vaclock, Accuform, and 2 fields with MLCs to block penis and anus. MORE FIELDS WITH MLCs MAY BE ADDED IN DOSIMETRY for dose homogeneity.  I have requested : 3D Simulation  I have requested a DVH of the following structures: penis, anus, target.    The patient will receive 32 Gy in 16 fractions to the testes/scrotum bilaterally.   -----------------------------------  Eppie Gibson, MD

## 2018-08-29 ENCOUNTER — Inpatient Hospital Stay: Payer: Medicare HMO | Attending: Hematology

## 2018-08-29 ENCOUNTER — Other Ambulatory Visit: Payer: Self-pay

## 2018-08-29 ENCOUNTER — Telehealth: Payer: Self-pay | Admitting: Hematology

## 2018-08-29 ENCOUNTER — Inpatient Hospital Stay (HOSPITAL_BASED_OUTPATIENT_CLINIC_OR_DEPARTMENT_OTHER): Payer: Medicare HMO | Admitting: Hematology

## 2018-08-29 VITALS — BP 117/72 | HR 67 | Temp 98.3°F | Resp 17 | Ht 73.0 in | Wt 239.1 lb

## 2018-08-29 DIAGNOSIS — R918 Other nonspecific abnormal finding of lung field: Secondary | ICD-10-CM | POA: Diagnosis not present

## 2018-08-29 DIAGNOSIS — C8339 Diffuse large B-cell lymphoma, extranodal and solid organ sites: Secondary | ICD-10-CM | POA: Diagnosis not present

## 2018-08-29 DIAGNOSIS — C8335 Diffuse large B-cell lymphoma, lymph nodes of inguinal region and lower limb: Secondary | ICD-10-CM | POA: Diagnosis not present

## 2018-08-29 DIAGNOSIS — Z7982 Long term (current) use of aspirin: Secondary | ICD-10-CM

## 2018-08-29 DIAGNOSIS — E785 Hyperlipidemia, unspecified: Secondary | ICD-10-CM | POA: Diagnosis not present

## 2018-08-29 DIAGNOSIS — Z510 Encounter for antineoplastic radiation therapy: Secondary | ICD-10-CM | POA: Diagnosis not present

## 2018-08-29 DIAGNOSIS — Z79899 Other long term (current) drug therapy: Secondary | ICD-10-CM | POA: Diagnosis not present

## 2018-08-29 DIAGNOSIS — Z5111 Encounter for antineoplastic chemotherapy: Secondary | ICD-10-CM

## 2018-08-29 LAB — CBC WITH DIFFERENTIAL/PLATELET
Abs Immature Granulocytes: 0.01 10*3/uL (ref 0.00–0.07)
Basophils Absolute: 0.1 10*3/uL (ref 0.0–0.1)
Basophils Relative: 1 %
Eosinophils Absolute: 0.3 10*3/uL (ref 0.0–0.5)
Eosinophils Relative: 5 %
HCT: 40.8 % (ref 39.0–52.0)
Hemoglobin: 13.6 g/dL (ref 13.0–17.0)
Immature Granulocytes: 0 %
Lymphocytes Relative: 19 %
Lymphs Abs: 1 10*3/uL (ref 0.7–4.0)
MCH: 31 pg (ref 26.0–34.0)
MCHC: 33.3 g/dL (ref 30.0–36.0)
MCV: 92.9 fL (ref 80.0–100.0)
Monocytes Absolute: 0.9 10*3/uL (ref 0.1–1.0)
Monocytes Relative: 17 %
Neutro Abs: 3 10*3/uL (ref 1.7–7.7)
Neutrophils Relative %: 58 %
Platelets: 153 10*3/uL (ref 150–400)
RBC: 4.39 MIL/uL (ref 4.22–5.81)
RDW: 12.2 % (ref 11.5–15.5)
WBC: 5.2 10*3/uL (ref 4.0–10.5)
nRBC: 0 % (ref 0.0–0.2)

## 2018-08-29 LAB — CMP (CANCER CENTER ONLY)
ALT: 19 U/L (ref 0–44)
AST: 22 U/L (ref 15–41)
Albumin: 3.6 g/dL (ref 3.5–5.0)
Alkaline Phosphatase: 69 U/L (ref 38–126)
Anion gap: 7 (ref 5–15)
BUN: 11 mg/dL (ref 8–23)
CO2: 24 mmol/L (ref 22–32)
Calcium: 9 mg/dL (ref 8.9–10.3)
Chloride: 108 mmol/L (ref 98–111)
Creatinine: 0.99 mg/dL (ref 0.61–1.24)
GFR, Est AFR Am: >60 mL/min (ref >60)
GFR, Estimated: >60 mL/min (ref >60)
Glucose, Bld: 100 mg/dL — ABNORMAL HIGH (ref 70–99)
Potassium: 4 mmol/L (ref 3.5–5.1)
Sodium: 139 mmol/L (ref 135–145)
Total Bilirubin: 1.4 mg/dL — ABNORMAL HIGH (ref 0.3–1.2)
Total Protein: 6.5 g/dL (ref 6.5–8.1)

## 2018-08-29 LAB — LACTATE DEHYDROGENASE: LDH: 151 U/L (ref 98–192)

## 2018-08-29 MED ORDER — DOXYCYCLINE HYCLATE 100 MG PO TABS: 100.0000 mg | ORAL_TABLET | Freq: Two times a day (BID) | ORAL | 0 refills | Status: DC

## 2018-08-29 NOTE — Progress Notes (Signed)
HEMATOLOGY/ONCOLOGY CLINIC NOTE  Date of Service: 08/29/2018  Patient Care Team: [name], MD as PCP - General (Family Medicine)  CHIEF COMPLAINTS/PURPOSE OF CONSULTATION:  Primary testicular diffuse Large B-Cell Lymphoma   HISTORY OF PRESENTING ILLNESS:   Carl [name] is a wonderful 78 y.o. male who has been referred to [place] by Dr. [name] for evaluation and management of Diffuse Large B-Cell Lymphoma. The pt reports that he is doing well overall.  The pt notes that he first began feeling differently about 4-5 months ago, when he noticed left testicular swelling. He notes that the his testicle was "a little bit larger than a golf ball," and that this was not painful. The pt notes that he has not had any abdominal distension or abdominal pain. The pt denies any fevers, chills, night sweats, or unexpected weight loss. He also denies any leg swelling. The pt denies any trauma sustained by the testicles. He denies noticing any other related symptoms.   The pt reports that he believed he had food poisoning prior to this, as he had lots of diarrhea. He notes that he has observed a small knot on the back of his right wrist, which has not changed over the last year.  The pt has two stents in his heart which were placed in 2016. The pt notes that his blood pressure has been stable and well-controlled. He notes that his last ECHO was 6 months ago, and he has not had any CP or SOB since that time. He takes 81mg  aspirin daily. He also takes Atorvastatin. The pt denies feeling limited in his walking by SOB or CP. He notes that he could walk 2 miles before ceasing to walk, noting that feeling tired in his muscles would limit him.   The pt has recently pursued surgery on his throat with ENT Dr. Melida Quitter for altered voice and hoarseness related to his vocal cords.   The pt notes that he currently lives by himself, and is not married. He has a daughter who is 6 years old. He  notes that his daughter "tries to help out." He notes that he does not have a power of attorney, but that this would be his daughter.   Of note prior to the patient's visit today, pt has had a Left Testis Surgical biopsy completed on 01/05/18 with results revealing Diffuse Large B-Cell Lymphoma.   The pt also had an US Scrotum on 12/08/17 which revealed Abnormal appearance of the left testicle which is larger than the right and diffusely heterogeneous in echotexture. Cannot exclude infiltrating process/mass. Appearance is concerning for possible neoplasm.  Most recent lab results (11/26/17) of BMP revealed all values WNL except for Glucose at 104, Creatinine at 1.30, Calcium at 8.7, GFR at 59 11/26/17 Hemoglobin value at 13.6.   On review of systems, pt reports stable energy levels, left testicular swelling, and denies fevers, chills, night sweats, unexpected weight loss, CP, SOB, back pains, flank pains, abdominal pains, leg swelling, testicular pain, and any other symptoms.   On PMHx the pt reports CAD, two heart stents placed in 2016. On Social Hx the pt denies smoking.  On Family Hx the pt reports mother with lymphoma of the breast. Two sisters with breast cancer, one at age 3.   INTERVAL HISTORY:   Carl [name] returns today for management and evaluation of his Primary testicular Diffuse Large B-Cell Lymphoma C6 R-mini-CHOP. The patient's last visit with [place] was on 07/19/2018.  The pt reports that he is doing well overall.  He is starting radiation therapy with Dr. Isidore Moos.   Of note since the patient's last visit, pt has had a repeat PET scan completed on 08/23/2018 with results revealing Mildly hypermetabolic mildly enlarged AP window lymph node, stable since 02/07/2018 PET-CT. Low level metabolism associated with new mild patchy consolidation in the medial segment right middle lobe, potentially Inflammatory. New mildly hypermetabolic right hilar lymph nodes, nonspecific,  potentially reactive to the right middle lobe process. Nonspecific patchy hypermetabolism within the right testis is increased. No discrete right testicular mass on the noncontrast CT Images. These findings are equivocal for active lymphoma. Suggest attention on follow-up PET-CT in 3-6 months. Aortic Atherosclerosis (ICD10-I70.0).  Lab results today (08/29/18) of CBC w/diff and CMP is as follows: all values are WNL except for glucose at 100, and total bilirubin at 1.4.  On review of systems, pt reports good appetite and denies fevers, chills, or night sweats, cough, shortness of breath, leg pain, back pain, abdominal pain, testicular pain, or any other symptoms.    MEDICAL HISTORY:  Past Medical History:  Diagnosis Date   Arthritis    Coronary artery disease    HOH (hard of hearing)    Hyperlipidemia    Hypertension    Ischemic cardiomyopathy    Myocardial infarction ([place]) 2016   Quadriceps tendon rupture    LEFT    SURGICAL HISTORY: Past Surgical History:  Procedure Laterality Date   BIOPSY  04/27/2018   Procedure: BIOPSY;  Surgeon: Ronnette Juniper, MD;  Location: [facility];  Service: Gastroenterology;;   CARDIAC CATHETERIZATION N/A 10/15/2014   Procedure: Left Heart Cath and Coronary Angiography;  Surgeon: Charolette Forward, MD;  Location: [facility];  Service: Cardiovascular;  Laterality: N/A;   COLONOSCOPY     ESOPHAGOGASTRODUODENOSCOPY (EGD) WITH PROPOFOL N/A 04/27/2018   Procedure: ESOPHAGOGASTRODUODENOSCOPY (EGD) WITH PROPOFOL;  Surgeon: Ronnette Juniper, MD;  Location: [facility];  Service: Gastroenterology;  Laterality: N/A;   IR IMAGING GUIDED PORT INSERTION  03/14/2018   MICROLARYNGOSCOPY WITH CO2 LASER AND EXCISION OF VOCAL CORD LESION N/A 11/26/2017   Procedure: MICROLARYNGOSCOPY WITH CO2 LASER AND EXCISION OF VOCAL CORD LESION WITH JET VENTILATION;  Surgeon: Melida Quitter, MD;  Location: [facility];  Service: ENT;  Laterality: N/A;   QUADRICEPS TENDON REPAIR Left  02/20/2014   Procedure: LEFT REPAIR QUADRICEP TENDON;  Surgeon: Mauri Pole, MD;  Location: [facility];  Service: Orthopedics;  Laterality: Left;   THROAT SURGERY  2010    SOCIAL HISTORY: Social History   Socioeconomic History   Marital status: Divorced    Spouse name: Not on file   Number of children: Not on file   Years of education: Not on file   Highest education level: Not on file  Occupational History   Not on file  Social Needs   Financial resource strain: Not on file   Food insecurity    Worry: Not on file    Inability: Not on file   Transportation needs    Medical: No    Non-medical: No  Tobacco Use   Smoking status: Never Smoker   Smokeless tobacco: Never Used  Substance and Sexual Activity   Alcohol use: Not Currently   Drug use: No   Sexual activity: Not on file  Lifestyle   Physical activity    Days per week: Not on file    Minutes per session: Not on file   Stress: Not on file  Relationships  Social Herbalist on phone: Not on file    Gets together: Not on file    Attends religious service: Not on file    Active member of club or organization: Not on file    Attends meetings of clubs or organizations: Not on file    Relationship status: Not on file   Intimate partner violence    Fear of current or ex partner: No    Emotionally abused: No    Physically abused: No    Forced sexual activity: No  Other Topics Concern   Not on file  Social History Narrative   Not on file    FAMILY HISTORY: No family history on file.  ALLERGIES:  has No Known Allergies.  MEDICATIONS:  Current Outpatient Medications  Medication Sig Dispense Refill   aspirin EC 81 MG tablet Take 81 mg by mouth daily.     atorvastatin (LIPITOR) 40 MG tablet Take 1 tablet (40 mg total) by mouth daily at 6 PM. 60 tablet 3   carvedilol (COREG) 6.25 MG tablet Take 3.21 mg by mouth 2 (two) times daily with a meal.      cholecalciferol (VITAMIN D) 1000  UNITS tablet Take 1,000 Units by mouth daily. He reports 5000 units     doxycycline (VIBRA-TABS) 100 MG tablet Take 1 tablet (100 mg total) by mouth 2 (two) times daily. 10 tablet 0   gabapentin (NEURONTIN) 100 MG capsule 1 CAPSULE BY MOUTH EVERY DAY FOR 1 WK, 1 CAP TWICE A DAY FOR 1 WK, 1 CAP 3 TIMES A DAY THEREAFTER     losartan (COZAAR) 25 MG tablet Take 25 mg by mouth every evening.     Multiple Vitamin (MULTIVITAMIN WITH MINERALS) TABS tablet Take 1 tablet by mouth daily.     nitroGLYCERIN (NITROSTAT) 0.4 MG SL tablet Place 1 tablet (0.4 mg total) under the tongue every 5 (five) minutes x 3 doses as needed for chest pain. (Patient not taking: Reported on 08/24/2018) 25 tablet 12   ondansetron (ZOFRAN) 8 MG tablet Take 1 tablet (8 mg total) by mouth 2 (two) times daily as needed for refractory nausea / vomiting. Start on day 3 after cyclophosphamide chemotherapy. (Patient not taking: Reported on 08/24/2018) 30 tablet 1   pantoprazole (PROTONIX) 40 MG tablet Take 1 tablet (40 mg total) by mouth 2 (two) times daily for 30 days. 60 tablet 0   Polyethyl Glycol-Propyl Glycol (SYSTANE OP) Place 1 drop into both eyes at bedtime.      polyethylene glycol (MIRALAX) packet Take 17 g by mouth daily. (Patient taking differently: Take 17 g by mouth daily. He reports he takes it every other day.) 30 each 1   predniSONE (DELTASONE) 20 MG tablet Take 3 tablets (60 mg total) by mouth daily. Take on days 1-5 of chemotherapy. (Patient not taking: Reported on 08/24/2018) 15 tablet 5   prochlorperazine (COMPAZINE) 10 MG tablet Take 1 tablet (10 mg total) by mouth every 6 (six) hours as needed (Nausea or vomiting). (Patient not taking: Reported on 08/24/2018) 30 tablet 6   senna-docusate (SENNA S) 8.6-50 MG tablet Take 2 tablets by mouth at bedtime as needed for mild constipation or moderate constipation. 60 tablet 1   white petrolatum (VASELINE) GEL Apply 1 application topically as needed for dry skin  (itching).     No current facility-administered medications for this visit.     REVIEW OF SYSTEMS:   A 10+ POINT REVIEW OF SYSTEMS WAS OBTAINED including neurology,  dermatology, psychiatry, cardiac, respiratory, lymph, extremities, GI, GU, Musculoskeletal, constitutional, breasts, reproductive, HEENT.  All pertinent positives are noted in the HPI.  All others are negative.     PHYSICAL EXAMINATION: ECOG PERFORMANCE STATUS: 2 - Symptomatic, <50% confined to bed  Vitals:   08/29/18 1346  BP: 117/72  Pulse: 67  Resp: 17  Temp: 98.3 F (36.8 C)  SpO2: 99%   Filed Weights   08/29/18 1346  Weight: 239 lb 1.6 oz (108.5 kg)   .Body mass index is 31.55 kg/m.  GENERAL:alert, in no acute distress and comfortable SKIN: no acute rashes, no significant lesions EYES: conjunctiva are pink and non-injected, sclera anicteric OROPHARYNX: MMM, no exudates, no oropharyngeal erythema or ulceration NECK: supple, no JVD LYMPH:  no palpable lymphadenopathy in the cervical, axillary or inguinal regions LUNGS: clear to auscultation b/l with normal respiratory effort HEART: regular rate & rhythm ABDOMEN:  normoactive bowel sounds , non tender, not distended. No palpable hepatosplenomegaly.  Extremity: no pedal edema PSYCH: alert & oriented x 3 with fluent speech NEURO: no focal motor/sensory deficits   LABORATORY DATA:  I have reviewed the data as listed  . CBC Latest Ref Rng & Units 08/29/2018 07/19/2018 06/28/2018  WBC 4.0 - 10.5 K/uL 5.2 6.3 5.9  Hemoglobin 13.0 - 17.0 g/dL 13.6 12.1(L) 13.0  Hematocrit 39.0 - 52.0 % 40.8 37.5(L) 40.8  Platelets 150 - 400 K/uL 153 217 199    . CMP Latest Ref Rng & Units 08/29/2018 07/19/2018 06/28/2018  Glucose 70 - 99 mg/dL 100(H) 118(H) 106(H)  BUN 8 - 23 mg/dL 11 12 16   Creatinine 0.61 - 1.24 mg/dL 0.99 1.03 1.02  Sodium 135 - 145 mmol/L 139 139 141  Potassium 3.5 - 5.1 mmol/L 4.0 4.0 4.1  Chloride 98 - 111 mmol/L 108 107 108  CO2 22 - 32 mmol/L 24 24  26   Calcium 8.9 - 10.3 mg/dL 9.0 8.2(L) 8.6(L)  Total Protein 6.5 - 8.1 g/dL 6.5 6.1(L) 6.3(L)  Total Bilirubin 0.3 - 1.2 mg/dL 1.4(H) 0.7 0.5  Alkaline Phos 38 - 126 U/L 69 77 85  AST 15 - 41 U/L 22 18 20   ALT 0 - 44 U/L 19 16 20    01/05/18 Pathology:    RADIOGRAPHIC STUDIES: I have personally reviewed the radiological images as listed and agreed with the findings in the report. Nm Pet Image Restag (ps) Skull Base To Thigh  Result Date: 08/23/2018 CLINICAL DATA:  Subsequent treatment strategy for primary left testicular diffuse large B-cell lymphoma status post left orchiectomy and chemotherapy. EXAM: NUCLEAR MEDICINE PET SKULL BASE TO THIGH TECHNIQUE: 12.0 mCi F-18 FDG was injected intravenously. Full-ring PET imaging was performed from the skull base to thigh after the radiotracer. CT data was obtained and used for attenuation correction and anatomic localization. Fasting blood glucose: 101 mg/dl COMPARISON:  02/07/2018 PET-CT. FINDINGS: Mediastinal blood pool activity: SUV max 2.9 Liver activity: SUV max 4.2 NECK: No hypermetabolic lymph nodes in the neck. Incidental CT findings: Mucous retention cyst versus polyp in the inferior left maxillary sinus, stable. CHEST: Mildly enlarged and mildly hypermetabolic 1.0 cm AP window node with max SUV 3.9 (series 3/image 62), previously 1.1 cm with max SUV 4.1, not appreciably changed. No new enlarged or hypermetabolic mediastinal nodes. Mild hypermetabolism within right hilar lymph nodes with max SUV 4.3, new from prior. No hypermetabolic left hilar nodes. No enlarged or hypermetabolic axillary lymph nodes. There is low level metabolism (max SUV 2.5) associated with a new mild patchy consolidation  in the medial segment right middle lobe (series 8/image 48). Incidental CT findings: Anterior left lower lobe 3 mm solid pulmonary nodule (series 8/image 31), stable. No new significant pulmonary nodules. Stable symmetric mild bilateral gynecomastia. Coronary  atherosclerosis. Right internal jugular Port-A-Cath terminates at the cavoatrial junction. Atherosclerotic nonaneurysmal thoracic aorta. ABDOMEN/PELVIS: No abnormal hypermetabolic activity within the liver, pancreas, adrenal glands, or spleen. No hypermetabolic lymph nodes in the abdomen or pelvis. Patchy hypermetabolism within the right testis with max SUV 5.6, previous max SUV 3.7, increased. No discrete right testicular mass on the CT images. Left orchiectomy. Incidental CT findings: Simple 1.2 cm upper right renal cyst. Atherosclerotic nonaneurysmal abdominal aorta. Moderate diffuse colonic diverticulosis. Mild prostatomegaly. Chronic diffuse bladder wall thickening. SKELETON: No focal hypermetabolic activity to suggest skeletal metastasis. Incidental CT findings: none IMPRESSION: 1. Mildly hypermetabolic mildly enlarged AP window lymph node, stable since 02/07/2018 PET-CT. 2. Low level metabolism associated with new mild patchy consolidation in the medial segment right middle lobe, potentially inflammatory. 3. New mildly hypermetabolic right hilar lymph nodes, nonspecific, potentially reactive to the right middle lobe process. 4. Nonspecific patchy hypermetabolism within the right testis is increased. No discrete right testicular mass on the noncontrast CT images. 5. These findings are equivocal for active lymphoma. Suggest attention on follow-up PET-CT in 3-6 months. 6.  Aortic Atherosclerosis (ICD10-I70.0). Electronically Signed   By: Ilona Sorrel M.D.   On: 08/23/2018 12:18    ASSESSMENT & PLAN:   78 y.o. male with  1. Primary Testicular Diffuse Large B-Cell Lymphoma, Stage 1E 11/30/16 NM Myocar Multi w/spect w/wall motion which revealed a LV EF of 44%  12/08/17 US Scrotum revealed Abnormal appearance of the left testicle which is larger than the right and diffusely heterogeneous in echotexture. Cannot exclude infiltrating process/mass. Appearance is concerning for possible neoplasm.  01/05/18  Left testes biopsy revealed Primary Testicular Diffuse Large B-Cell Lymphoma   01/25/18 Hep B, Hep C and HIV negative  01/31/18 ECHO revealed a LV EF of 51%. Left ventricle: The cavity size was normal. Wall motion was normal; there were no regional wall motion abnormalities. Atrial septum: No defect or patent foramen ovale was identified.  02/07/18 PET/CT revealed No findings for metabolically active lymphoma involving the neck, chest, abdomen/pelvis or osseous structures.  Due to admission after first intrathecal methotrexate, concern for brain toxicity given patient's age, and concern for treatment tolerance, decided to hold IT MTX. No further concerns for bleeding, nausea, or vomiting.  2.  Past Medical History:  Diagnosis Date   Arthritis    Coronary artery disease    HOH (hard of hearing)    Hyperlipidemia    Hypertension    Ischemic cardiomyopathy    Myocardial infarction ([place]) 2016   Quadriceps tendon rupture    LEFT    PLAN: -Discussed pt labwork today, 08/29/18; all values are WNL except for glucose at 100, and total bilirubin at 1.4. -Discussed 08/23/2018 PET scan revealing Mildly hypermetabolic mildly enlarged AP window lymph node, stable since 02/07/2018 PET-CT. Low level metabolism associated with new mild patchy consolidation in the medial segment right middle lobe, potentially Inflammatory. New mildly hypermetabolic right hilar lymph nodes, nonspecific, potentially reactive to the right middle lobe process. Nonspecific patchy hypermetabolism within the right testis is increased. No discrete right testicular mass on the noncontrast CT Images. These findings are equivocal for active lymphoma. Suggest attention on follow-up PET-CT in 3-6 months. Aortic Atherosclerosis (ICD10-I70.0). -Discussed prescribing doxycycline based on PET findings for suspected /infection in lung. -  continue f/u with Dr Isidore Moos to complete testicular radiation therapy -See back in 2 months with  labs  FOLLOW UP: RTC with Dr Irene Limbo with labs in 6 weeks    All of the patients questions were answered with apparent satisfaction. The patient knows to call the clinic with any problems, questions or concerns.  The total time spent in the appt was 20 minutes and more than 50% was on counseling and direct patient cares.     Sullivan Lone MD MS AAHIVMS [facility] Hematology/Oncology Physician [facility]  (Office):       [phone] (Work cell):  [phone] (Fax):           [phone]  08/29/2018 2:20 PM  I, Jacqualyn Posey, am acting as a Education administrator for Dr. Sullivan Lone.   .I have reviewed the above documentation for accuracy and completeness, and I agree with the above. Brunetta Genera MD

## 2018-08-29 NOTE — Telephone Encounter (Signed)
Gave avs and calendar ° °

## 2018-09-01 ENCOUNTER — Ambulatory Visit
Admission: RE | Admit: 2018-09-01 | Discharge: 2018-09-01 | Disposition: A | Discharge status: Home / Self Care | Payer: Medicare HMO | Source: Ambulatory Visit | Attending: Radiation Oncology | Admitting: Radiation Oncology

## 2018-09-01 ENCOUNTER — Other Ambulatory Visit: Payer: Self-pay

## 2018-09-01 DIAGNOSIS — C8339 Diffuse large B-cell lymphoma, extranodal and solid organ sites: Secondary | ICD-10-CM | POA: Diagnosis not present

## 2018-09-01 DIAGNOSIS — Z510 Encounter for antineoplastic radiation therapy: Secondary | ICD-10-CM | POA: Diagnosis not present

## 2018-09-02 ENCOUNTER — Ambulatory Visit
Admission: RE | Admit: 2018-09-02 | Discharge: 2018-09-02 | Disposition: A | Discharge status: Home / Self Care | Payer: Medicare HMO | Source: Ambulatory Visit | Attending: Radiation Oncology | Admitting: Radiation Oncology

## 2018-09-02 ENCOUNTER — Other Ambulatory Visit: Payer: Self-pay

## 2018-09-02 DIAGNOSIS — Z510 Encounter for antineoplastic radiation therapy: Secondary | ICD-10-CM | POA: Diagnosis not present

## 2018-09-02 DIAGNOSIS — C8339 Diffuse large B-cell lymphoma, extranodal and solid organ sites: Secondary | ICD-10-CM | POA: Diagnosis not present

## 2018-09-05 ENCOUNTER — Other Ambulatory Visit: Payer: Self-pay

## 2018-09-05 ENCOUNTER — Ambulatory Visit
Admission: RE | Admit: 2018-09-05 | Discharge: 2018-09-05 | Disposition: A | Discharge status: Home / Self Care | Payer: Medicare HMO | Source: Ambulatory Visit | Attending: Radiation Oncology | Admitting: Radiation Oncology

## 2018-09-05 DIAGNOSIS — Z510 Encounter for antineoplastic radiation therapy: Secondary | ICD-10-CM | POA: Diagnosis not present

## 2018-09-05 DIAGNOSIS — C8339 Diffuse large B-cell lymphoma, extranodal and solid organ sites: Secondary | ICD-10-CM | POA: Diagnosis not present

## 2018-09-06 ENCOUNTER — Ambulatory Visit
Admission: RE | Admit: 2018-09-06 | Discharge: 2018-09-06 | Disposition: A | Discharge status: Home / Self Care | Payer: Medicare HMO | Source: Ambulatory Visit | Attending: Radiation Oncology | Admitting: Radiation Oncology

## 2018-09-06 DIAGNOSIS — Z510 Encounter for antineoplastic radiation therapy: Secondary | ICD-10-CM | POA: Diagnosis not present

## 2018-09-06 DIAGNOSIS — C8339 Diffuse large B-cell lymphoma, extranodal and solid organ sites: Secondary | ICD-10-CM | POA: Diagnosis not present

## 2018-09-07 ENCOUNTER — Ambulatory Visit
Admission: RE | Admit: 2018-09-07 | Discharge: 2018-09-07 | Disposition: A | Discharge status: Home / Self Care | Payer: Medicare HMO | Source: Ambulatory Visit | Attending: Radiation Oncology | Admitting: Radiation Oncology

## 2018-09-07 ENCOUNTER — Other Ambulatory Visit: Payer: Self-pay

## 2018-09-07 DIAGNOSIS — C8339 Diffuse large B-cell lymphoma, extranodal and solid organ sites: Secondary | ICD-10-CM | POA: Diagnosis not present

## 2018-09-07 DIAGNOSIS — Z510 Encounter for antineoplastic radiation therapy: Secondary | ICD-10-CM | POA: Diagnosis not present

## 2018-09-08 ENCOUNTER — Other Ambulatory Visit: Payer: Self-pay

## 2018-09-08 ENCOUNTER — Ambulatory Visit
Admission: RE | Admit: 2018-09-08 | Discharge: 2018-09-08 | Disposition: A | Discharge status: Home / Self Care | Payer: Medicare HMO | Source: Ambulatory Visit | Attending: Radiation Oncology | Admitting: Radiation Oncology

## 2018-09-08 DIAGNOSIS — Z510 Encounter for antineoplastic radiation therapy: Secondary | ICD-10-CM | POA: Diagnosis not present

## 2018-09-08 DIAGNOSIS — C8339 Diffuse large B-cell lymphoma, extranodal and solid organ sites: Secondary | ICD-10-CM | POA: Diagnosis not present

## 2018-09-09 ENCOUNTER — Ambulatory Visit
Admission: RE | Admit: 2018-09-09 | Discharge: 2018-09-09 | Disposition: A | Discharge status: Home / Self Care | Payer: Medicare HMO | Source: Ambulatory Visit | Attending: Radiation Oncology | Admitting: Radiation Oncology

## 2018-09-09 ENCOUNTER — Other Ambulatory Visit: Payer: Self-pay

## 2018-09-09 DIAGNOSIS — C8339 Diffuse large B-cell lymphoma, extranodal and solid organ sites: Secondary | ICD-10-CM | POA: Diagnosis not present

## 2018-09-09 DIAGNOSIS — Z510 Encounter for antineoplastic radiation therapy: Secondary | ICD-10-CM | POA: Diagnosis not present

## 2018-09-12 ENCOUNTER — Other Ambulatory Visit: Payer: Self-pay

## 2018-09-12 ENCOUNTER — Ambulatory Visit
Admission: RE | Admit: 2018-09-12 | Discharge: 2018-09-12 | Disposition: A | Discharge status: Home / Self Care | Payer: Medicare HMO | Source: Ambulatory Visit | Attending: Radiation Oncology | Admitting: Radiation Oncology

## 2018-09-12 DIAGNOSIS — Z510 Encounter for antineoplastic radiation therapy: Secondary | ICD-10-CM | POA: Insufficient documentation

## 2018-09-12 DIAGNOSIS — C8339 Diffuse large B-cell lymphoma, extranodal and solid organ sites: Secondary | ICD-10-CM | POA: Diagnosis not present

## 2018-09-13 ENCOUNTER — Ambulatory Visit
Admission: RE | Admit: 2018-09-13 | Discharge: 2018-09-13 | Disposition: A | Discharge status: Home / Self Care | Payer: Medicare HMO | Source: Ambulatory Visit | Attending: Radiation Oncology | Admitting: Radiation Oncology

## 2018-09-13 ENCOUNTER — Other Ambulatory Visit: Payer: Self-pay

## 2018-09-13 DIAGNOSIS — C8339 Diffuse large B-cell lymphoma, extranodal and solid organ sites: Secondary | ICD-10-CM | POA: Diagnosis not present

## 2018-09-13 DIAGNOSIS — Z510 Encounter for antineoplastic radiation therapy: Secondary | ICD-10-CM | POA: Diagnosis not present

## 2018-09-14 ENCOUNTER — Ambulatory Visit
Admission: RE | Admit: 2018-09-14 | Discharge: 2018-09-14 | Disposition: A | Discharge status: Home / Self Care | Payer: Medicare HMO | Source: Ambulatory Visit | Attending: Radiation Oncology | Admitting: Radiation Oncology

## 2018-09-14 ENCOUNTER — Other Ambulatory Visit: Payer: Self-pay

## 2018-09-14 DIAGNOSIS — C8339 Diffuse large B-cell lymphoma, extranodal and solid organ sites: Secondary | ICD-10-CM | POA: Diagnosis not present

## 2018-09-14 DIAGNOSIS — Z510 Encounter for antineoplastic radiation therapy: Secondary | ICD-10-CM | POA: Diagnosis not present

## 2018-09-15 ENCOUNTER — Ambulatory Visit
Admission: RE | Admit: 2018-09-15 | Discharge: 2018-09-15 | Disposition: A | Discharge status: Home / Self Care | Payer: Medicare HMO | Source: Ambulatory Visit | Attending: Radiation Oncology | Admitting: Radiation Oncology

## 2018-09-15 ENCOUNTER — Other Ambulatory Visit: Payer: Self-pay

## 2018-09-15 DIAGNOSIS — Z510 Encounter for antineoplastic radiation therapy: Secondary | ICD-10-CM | POA: Diagnosis not present

## 2018-09-15 DIAGNOSIS — C8339 Diffuse large B-cell lymphoma, extranodal and solid organ sites: Secondary | ICD-10-CM | POA: Diagnosis not present

## 2018-09-16 ENCOUNTER — Ambulatory Visit
Admission: RE | Admit: 2018-09-16 | Discharge: 2018-09-16 | Disposition: A | Discharge status: Home / Self Care | Payer: Medicare HMO | Source: Ambulatory Visit | Attending: Radiation Oncology | Admitting: Radiation Oncology

## 2018-09-16 ENCOUNTER — Other Ambulatory Visit: Payer: Self-pay

## 2018-09-16 DIAGNOSIS — C8339 Diffuse large B-cell lymphoma, extranodal and solid organ sites: Secondary | ICD-10-CM | POA: Diagnosis not present

## 2018-09-16 DIAGNOSIS — Z510 Encounter for antineoplastic radiation therapy: Secondary | ICD-10-CM | POA: Diagnosis not present

## 2018-09-19 ENCOUNTER — Ambulatory Visit
Admission: RE | Admit: 2018-09-19 | Discharge: 2018-09-19 | Disposition: A | Discharge status: Home / Self Care | Payer: Medicare HMO | Source: Ambulatory Visit | Attending: Radiation Oncology | Admitting: Radiation Oncology

## 2018-09-19 DIAGNOSIS — C8339 Diffuse large B-cell lymphoma, extranodal and solid organ sites: Secondary | ICD-10-CM | POA: Diagnosis not present

## 2018-09-19 DIAGNOSIS — Z510 Encounter for antineoplastic radiation therapy: Secondary | ICD-10-CM | POA: Diagnosis not present

## 2018-09-20 ENCOUNTER — Other Ambulatory Visit: Payer: Self-pay

## 2018-09-20 ENCOUNTER — Ambulatory Visit
Admission: RE | Admit: 2018-09-20 | Discharge: 2018-09-20 | Disposition: A | Discharge status: Home / Self Care | Payer: Medicare HMO | Source: Ambulatory Visit | Attending: Radiation Oncology | Admitting: Radiation Oncology

## 2018-09-20 DIAGNOSIS — Z510 Encounter for antineoplastic radiation therapy: Secondary | ICD-10-CM | POA: Diagnosis not present

## 2018-09-20 DIAGNOSIS — C8339 Diffuse large B-cell lymphoma, extranodal and solid organ sites: Secondary | ICD-10-CM | POA: Diagnosis not present

## 2018-09-21 ENCOUNTER — Other Ambulatory Visit: Payer: Self-pay

## 2018-09-21 ENCOUNTER — Ambulatory Visit
Admission: RE | Admit: 2018-09-21 | Discharge: 2018-09-21 | Disposition: A | Discharge status: Home / Self Care | Payer: Medicare HMO | Source: Ambulatory Visit | Attending: Radiation Oncology | Admitting: Radiation Oncology

## 2018-09-21 DIAGNOSIS — C8339 Diffuse large B-cell lymphoma, extranodal and solid organ sites: Secondary | ICD-10-CM | POA: Diagnosis not present

## 2018-09-21 DIAGNOSIS — Z510 Encounter for antineoplastic radiation therapy: Secondary | ICD-10-CM | POA: Diagnosis not present

## 2018-09-22 ENCOUNTER — Other Ambulatory Visit: Payer: Self-pay

## 2018-09-22 ENCOUNTER — Ambulatory Visit
Admission: RE | Admit: 2018-09-22 | Discharge: 2018-09-22 | Disposition: A | Discharge status: Home / Self Care | Payer: Medicare HMO | Source: Ambulatory Visit | Attending: Radiation Oncology | Admitting: Radiation Oncology

## 2018-09-22 ENCOUNTER — Encounter: Payer: Self-pay | Admitting: Radiation Oncology

## 2018-09-22 DIAGNOSIS — Z510 Encounter for antineoplastic radiation therapy: Secondary | ICD-10-CM | POA: Diagnosis not present

## 2018-09-22 DIAGNOSIS — C8339 Diffuse large B-cell lymphoma, extranodal and solid organ sites: Secondary | ICD-10-CM | POA: Diagnosis not present

## 2018-10-06 NOTE — Progress Notes (Signed)
Carl [name] presents for follow up of radiation completed 09/22/18 to his bilateral testes/ scrotum. He reports a lesion to his scrotal area with edema. He tells me that the area is not draining at this time.  He is applying neosporin and several creams he received in the mail. The names are Cam Well, herb to soothe and Rejuvaskin, skin recovery cream. He does tell me that he feels like there has been a slight improvement. He does still rate the pain a 8/10. He denies other concerns at this time.   BP 130/79   Pulse 90   Temp 97.8 F (36.6 C) (Tympanic)   Resp 18   Wt 247 lb (112 kg)   SpO2 96% Comment: room air  BMI 32.59 kg/m    Wt Readings from Last 3 Encounters:  10/07/18 247 lb (112 kg)  08/29/18 239 lb 1.6 oz (108.5 kg)  08/23/18 240 lb (108.9 kg)

## 2018-10-06 NOTE — Progress Notes (Signed)
Patient Name: Carl [name] MRN: [id number] DOB: [birth year] Referring Physician: Sullivan Lone (Profile Not Attached) Date of Service: 09/22/2018 [facility], [place]                                                        End Of Treatment Note  Diagnoses: C83.39-Diffuse large b-cell lymphoma, extranodal and solid organ sites  Cancer Staging: Stage IE  Intent: Curative  Radiation Treatment Dates: 09/01/2018 through 09/22/2018 Site Technique Total Dose Dose per Fx Completed Fx Beam Energies  Pelvis: Pelvis_testes 3D 32/32 Gy 2 16/16 6X, 10X   Narrative: The patient tolerated radiation therapy relatively well. He reported improved nocturia. He denied dysuria or hematuria and fatigue throughout treatment. Towards the end of treatment, he experienced pain to his groin.  Plan: The patient will follow-up with radiation oncology in 2 weeks .  ________________________________________________ -----------------------------------  Eppie Gibson, MD  This document serves as a record of services personally performed by Eppie Gibson, MD. It was created on her behalf by Wilburn Mylar, a trained medical scribe. The creation of this record is based on the scribe's personal observations and the provider's statements to them. This document has been checked and approved by the attending provider.

## 2018-10-06 NOTE — Progress Notes (Signed)
Radiation Oncology         [phone] ________________________________  Name: Carl [name] MRN: [id number]  Date: 10/07/2018  DOB: 11-19-1940  Follow-Up Visit Note  Outpatient  CC: [name], MD  Brunetta Genera, MD  Diagnosis and Prior Radiotherapy:    ICD-10-CM   1. 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  He reports he is urinating and making bowel movements without any difficulty and he denies fevers.  He reports increased swelling of the scrotum -  the skin is not draining at this time.  He is applying neosporin and several creams he received in the mail. The names are Cam Well, herb to soothe and Rejuvaskin, skin recovery cream. He does tell me that he feels like there has been a slight improvement.   He denies other concerns at this time.    He is scheduled to follow up with Dr. Irene Limbo on 10/10/2018.                              ALLERGIES:  has No Known Allergies.  Meds: Current Outpatient Medications  Medication Sig Dispense Refill   aspirin EC 81 MG tablet Take 81 mg by mouth daily.     atorvastatin (LIPITOR) 40 MG tablet Take 1 tablet (40 mg total) by mouth daily at 6 PM. 60 tablet 3   carvedilol (COREG) 6.25 MG tablet Take 3.21 mg by mouth 2 (two) times daily with a meal.      cholecalciferol (VITAMIN D) 1000 UNITS tablet Take 1,000 Units by mouth daily. He reports 5000 units     gabapentin (NEURONTIN) 100 MG capsule 1 CAPSULE BY MOUTH EVERY DAY FOR 1 WK, 1 CAP TWICE A DAY FOR 1 WK, 1 CAP 3 TIMES A DAY THEREAFTER     losartan (COZAAR) 25 MG tablet Take 25 mg by mouth every evening.     Multiple Vitamin (MULTIVITAMIN WITH MINERALS) TABS tablet Take 1  tablet by mouth daily.     Polyethyl Glycol-Propyl Glycol (SYSTANE OP) Place 1 drop into both eyes at bedtime.      polyethylene glycol (MIRALAX) packet Take 17 g by mouth daily. (Patient taking differently: Take 17 g by mouth daily. He reports he takes it every other day.) 30 each 1   senna-docusate (SENNA S) 8.6-50 MG tablet Take 2 tablets by mouth at bedtime as needed for mild constipation or moderate constipation. 60 tablet 1   white petrolatum (VASELINE) GEL Apply 1 application topically as needed for dry skin (itching).     doxycycline (VIBRA-TABS) 100 MG tablet Take 1 tablet (100 mg total) by mouth 2 (two) times daily. (Patient not taking: Reported on 10/07/2018) 10 tablet 0   nitroGLYCERIN (NITROSTAT) 0.4 MG SL tablet Place 1 tablet (0.4 mg total) under the tongue every 5 (five) minutes x 3 doses as needed for chest pain. (Patient not taking: Reported on 08/24/2018) 25 tablet 12   ondansetron (ZOFRAN) 8 MG tablet Take 1 tablet (8 mg total) by mouth 2 (two) times daily as needed for refractory nausea / vomiting. Start on day 3 after cyclophosphamide chemotherapy. (Patient not taking: Reported  on 08/24/2018) 30 tablet 1   pantoprazole (PROTONIX) 40 MG tablet Take 1 tablet (40 mg total) by mouth 2 (two) times daily for 30 days. 60 tablet 0   predniSONE (DELTASONE) 20 MG tablet Take 3 tablets (60 mg total) by mouth daily. Take on days 1-5 of chemotherapy. (Patient not taking: Reported on 08/24/2018) 15 tablet 5   prochlorperazine (COMPAZINE) 10 MG tablet Take 1 tablet (10 mg total) by mouth every 6 (six) hours as needed (Nausea or vomiting). (Patient not taking: Reported on 08/24/2018) 30 tablet 6   No current facility-administered medications for this encounter.     Physical Findings: The patient is in no acute distress. Patient is alert and oriented.  weight is 247 lb (112 kg). His tympanic temperature is 97.8 F (36.6 C). His blood pressure is 130/79 and his pulse is 90. His respiration  is 18 and oxygen saturation is 96%. .    There is scrotal edema with erythema and resolving desquamation in the groin regions, more notable groin desquamation on the left    Lab Findings: Lab Results  Component Value Date   WBC 5.2 08/29/2018   HGB 13.6 08/29/2018   HCT 40.8 08/29/2018   MCV 92.9 08/29/2018   PLT 153 08/29/2018    Radiographic Findings: No results found.  Impression/Plan:  Testicular Lymphoma   He developed significant skin irritation and swelling of the scrotum following completion of radiotherapy.  I believe the swelling is most likely due to inflammation but cannot entirely rule out infection.  He does not show any malaise nor any fever.  Recommended applying Neosporin to the groin regions where the desquamation is most notable and continue to use the creams that have soothed him throughout the scrotum.  Cleanse the area every day.  I took photos above for medical oncology to review and if he is not feeling a bit better in the next few days may consider antibiotics or referral to urology or even an testicular ultrasound.  However, we are only 2 weeks out from radiotherapy  and the inflammation will probably show improvement over the next few weeks.  I have scheduled a follow-up with me in 2 weeks.  He is pleased with this plan.  I spent 15 minutes face to face with the patient and more than 50% of that time was spent in counseling and/or coordination of care. _____________________________________   Eppie Gibson, MD  This document serves as a record of services personally performed by Eppie Gibson, MD. It was created on her behalf by Wilburn Mylar, a trained medical scribe. The creation of this record is based on the scribe's personal observations and the provider's statements to them. This document has been checked and approved by the attending provider.

## 2018-10-07 ENCOUNTER — Other Ambulatory Visit: Payer: Self-pay

## 2018-10-07 ENCOUNTER — Encounter: Payer: Self-pay | Admitting: Radiation Oncology

## 2018-10-07 ENCOUNTER — Ambulatory Visit
Admission: RE | Admit: 2018-10-07 | Discharge: 2018-10-07 | Disposition: A | Discharge status: Home / Self Care | Payer: Medicare HMO | Source: Ambulatory Visit | Attending: Radiation Oncology | Admitting: Radiation Oncology

## 2018-10-07 DIAGNOSIS — Z510 Encounter for antineoplastic radiation therapy: Secondary | ICD-10-CM | POA: Diagnosis not present

## 2018-10-07 DIAGNOSIS — C8339 Diffuse large B-cell lymphoma, extranodal and solid organ sites: Secondary | ICD-10-CM | POA: Diagnosis not present

## 2018-10-08 NOTE — Progress Notes (Signed)
HEMATOLOGY/ONCOLOGY CLINIC NOTE  Date of Service: 10/10/2018  Patient Care Team: [name], MD as PCP - General (Family Medicine)  CHIEF COMPLAINTS/PURPOSE OF CONSULTATION:  Primary testicular diffuse Large B-Cell Lymphoma   HISTORY OF PRESENTING ILLNESS:   Carl [name] is a wonderful 78 y.o. male who has been referred to [place] by Dr. [name] for evaluation and management of Diffuse Large B-Cell Lymphoma. The pt reports that he is doing well overall.  The pt notes that he first began feeling differently about 4-5 months ago, when he noticed left testicular swelling. He notes that the his testicle was "a little bit larger than a golf ball," and that this was not painful. The pt notes that he has not had any abdominal distension or abdominal pain. The pt denies any fevers, chills, night sweats, or unexpected weight loss. He also denies any leg swelling. The pt denies any trauma sustained by the testicles. He denies noticing any other related symptoms.   The pt reports that he believed he had food poisoning prior to this, as he had lots of diarrhea. He notes that he has observed a small knot on the back of his right wrist, which has not changed over the last year.  The pt has two stents in his heart which were placed in 2016. The pt notes that his blood pressure has been stable and well-controlled. He notes that his last ECHO was 6 months ago, and he has not had any CP or SOB since that time. He takes 81mg  aspirin daily. He also takes Atorvastatin. The pt denies feeling limited in his walking by SOB or CP. He notes that he could walk 2 miles before ceasing to walk, noting that feeling tired in his muscles would limit him.   The pt has recently pursued surgery on his throat with ENT Dr. Melida Quitter for altered voice and hoarseness related to his vocal cords.   The pt notes that he currently lives by himself, and is not married. He has a daughter who is 46 years old. He  notes that his daughter "tries to help out." He notes that he does not have a power of attorney, but that this would be his daughter.   Of note prior to the patient's visit today, pt has had a Left Testis Surgical biopsy completed on 01/05/18 with results revealing Diffuse Large B-Cell Lymphoma.   The pt also had an US Scrotum on 12/08/17 which revealed Abnormal appearance of the left testicle which is larger than the right and diffusely heterogeneous in echotexture. Cannot exclude infiltrating process/mass. Appearance is concerning for possible neoplasm.  Most recent lab results (11/26/17) of BMP revealed all values WNL except for Glucose at 104, Creatinine at 1.30, Calcium at 8.7, GFR at 59 11/26/17 Hemoglobin value at 13.6.   On review of systems, pt reports stable energy levels, left testicular swelling, and denies fevers, chills, night sweats, unexpected weight loss, CP, SOB, back pains, flank pains, abdominal pains, leg swelling, testicular pain, and any other symptoms.   On PMHx the pt reports CAD, two heart stents placed in 2016. On Social Hx the pt denies smoking.  On Family Hx the pt reports mother with lymphoma of the breast. Two sisters with breast cancer, one at age 32.   INTERVAL HISTORY:   Carl [name] returns today for management and evaluation of his Primary testicular Diffuse Large B-Cell Lymphoma. The patient's last visit with [place] was on 08/29/2018. The pt  reports that he is doing well overall.  The pt reports that he did have severe irritation with his radiation. He lost skin in his groin area and had a small ulcer, which has since disappeared. Pt has been putting Neosporin on the area. He is eating well and beginning to feel less fatigue. He is also starting to walk around more and feeling less pain in the area.  Lab results today (10/10/18) of CBC w/diff and CMP is as follows: all values are WNL except for RBC at 4.13, Hgb at 12.8, Glucose at 149.  On review of  systems, pt reports fatigue and denies leg swelling, chest pain, abdominal pain, SOB, fevers, chills, night sweats and any other symptoms.    MEDICAL HISTORY:  Past Medical History:  Diagnosis Date   Arthritis    Coronary artery disease    HOH (hard of hearing)    Hyperlipidemia    Hypertension    Ischemic cardiomyopathy    Myocardial infarction ([place]) 2016   Quadriceps tendon rupture    LEFT    SURGICAL HISTORY: Past Surgical History:  Procedure Laterality Date   BIOPSY  04/27/2018   Procedure: BIOPSY;  Surgeon: Ronnette Juniper, MD;  Location: [facility];  Service: Gastroenterology;;   CARDIAC CATHETERIZATION N/A 10/15/2014   Procedure: Left Heart Cath and Coronary Angiography;  Surgeon: Charolette Forward, MD;  Location: [facility];  Service: Cardiovascular;  Laterality: N/A;   COLONOSCOPY     ESOPHAGOGASTRODUODENOSCOPY (EGD) WITH PROPOFOL N/A 04/27/2018   Procedure: ESOPHAGOGASTRODUODENOSCOPY (EGD) WITH PROPOFOL;  Surgeon: Ronnette Juniper, MD;  Location: [facility];  Service: Gastroenterology;  Laterality: N/A;   IR IMAGING GUIDED PORT INSERTION  03/14/2018   MICROLARYNGOSCOPY WITH CO2 LASER AND EXCISION OF VOCAL CORD LESION N/A 11/26/2017   Procedure: MICROLARYNGOSCOPY WITH CO2 LASER AND EXCISION OF VOCAL CORD LESION WITH JET VENTILATION;  Surgeon: Melida Quitter, MD;  Location: [facility];  Service: ENT;  Laterality: N/A;   QUADRICEPS TENDON REPAIR Left 02/20/2014   Procedure: LEFT REPAIR QUADRICEP TENDON;  Surgeon: Mauri Pole, MD;  Location: [facility];  Service: Orthopedics;  Laterality: Left;   THROAT SURGERY  2010    SOCIAL HISTORY: Social History   Socioeconomic History   Marital status: Divorced    Spouse name: Not on file   Number of children: Not on file   Years of education: Not on file   Highest education level: Not on file  Occupational History   Not on file  Social Needs   Financial resource strain: Not on file   Food insecurity    Worry: Not  on file    Inability: Not on file   Transportation needs    Medical: No    Non-medical: No  Tobacco Use   Smoking status: Never Smoker   Smokeless tobacco: Never Used  Substance and Sexual Activity   Alcohol use: Not Currently   Drug use: No   Sexual activity: Not on file  Lifestyle   Physical activity    Days per week: Not on file    Minutes per session: Not on file   Stress: Not on file  Relationships   Social connections    Talks on phone: Not on file    Gets together: Not on file    Attends religious service: Not on file    Active member of club or organization: Not on file    Attends meetings of clubs or organizations: Not on file    Relationship status: Not  on file   Intimate partner violence    Fear of current or ex partner: No    Emotionally abused: No    Physically abused: No    Forced sexual activity: No  Other Topics Concern   Not on file  Social History Narrative   Not on file    FAMILY HISTORY: No family history on file.  ALLERGIES:  has No Known Allergies.  MEDICATIONS:  Current Outpatient Medications  Medication Sig Dispense Refill   aspirin EC 81 MG tablet Take 81 mg by mouth daily.     atorvastatin (LIPITOR) 40 MG tablet Take 1 tablet (40 mg total) by mouth daily at 6 PM. 60 tablet 3   carvedilol (COREG) 6.25 MG tablet Take 3.21 mg by mouth 2 (two) times daily with a meal.      cholecalciferol (VITAMIN D) 1000 UNITS tablet Take 1,000 Units by mouth daily. He reports 5000 units     doxycycline (VIBRA-TABS) 100 MG tablet Take 1 tablet (100 mg total) by mouth 2 (two) times daily. (Patient not taking: Reported on 10/07/2018) 10 tablet 0   gabapentin (NEURONTIN) 100 MG capsule 1 CAPSULE BY MOUTH EVERY DAY FOR 1 WK, 1 CAP TWICE A DAY FOR 1 WK, 1 CAP 3 TIMES A DAY THEREAFTER     losartan (COZAAR) 25 MG tablet Take 25 mg by mouth every evening.     Multiple Vitamin (MULTIVITAMIN WITH MINERALS) TABS tablet Take 1 tablet by mouth daily.       nitroGLYCERIN (NITROSTAT) 0.4 MG SL tablet Place 1 tablet (0.4 mg total) under the tongue every 5 (five) minutes x 3 doses as needed for chest pain. (Patient not taking: Reported on 08/24/2018) 25 tablet 12   ondansetron (ZOFRAN) 8 MG tablet Take 1 tablet (8 mg total) by mouth 2 (two) times daily as needed for refractory nausea / vomiting. Start on day 3 after cyclophosphamide chemotherapy. (Patient not taking: Reported on 08/24/2018) 30 tablet 1   pantoprazole (PROTONIX) 40 MG tablet Take 1 tablet (40 mg total) by mouth 2 (two) times daily for 30 days. 60 tablet 0   Polyethyl Glycol-Propyl Glycol (SYSTANE OP) Place 1 drop into both eyes at bedtime.      polyethylene glycol (MIRALAX) packet Take 17 g by mouth daily. (Patient taking differently: Take 17 g by mouth daily. He reports he takes it every other day.) 30 each 1   predniSONE (DELTASONE) 20 MG tablet Take 3 tablets (60 mg total) by mouth daily. Take on days 1-5 of chemotherapy. (Patient not taking: Reported on 08/24/2018) 15 tablet 5   prochlorperazine (COMPAZINE) 10 MG tablet Take 1 tablet (10 mg total) by mouth every 6 (six) hours as needed (Nausea or vomiting). (Patient not taking: Reported on 08/24/2018) 30 tablet 6   senna-docusate (SENNA S) 8.6-50 MG tablet Take 2 tablets by mouth at bedtime as needed for mild constipation or moderate constipation. 60 tablet 1   white petrolatum (VASELINE) GEL Apply 1 application topically as needed for dry skin (itching).     No current facility-administered medications for this visit.     REVIEW OF SYSTEMS:    A 10+ POINT REVIEW OF SYSTEMS WAS OBTAINED including neurology, dermatology, psychiatry, cardiac, respiratory, lymph, extremities, GI, GU, Musculoskeletal, constitutional, breasts, reproductive, HEENT.  All pertinent positives are noted in the HPI.  All others are negative.    PHYSICAL EXAMINATION: ECOG PERFORMANCE STATUS: 2 - Symptomatic, <50% confined to bed  Vitals:   10/10/18  0850  BP:  118/86  Pulse: 66  Resp: 18  Temp: 98.3 F (36.8 C)  SpO2: 97%   Filed Weights   10/10/18 0850  Weight: 245 lb 3.2 oz (111.2 kg)   .Body mass index is 32.35 kg/m.  GENERAL:alert, in no acute distress and comfortable SKIN: no acute rashes, no significant lesions EYES: conjunctiva are pink and non-injected, sclera anicteric OROPHARYNX: MMM, no exudates, no oropharyngeal erythema or ulceration NECK: supple, no JVD LYMPH:  no palpable lymphadenopathy in the cervical, axillary or inguinal regions LUNGS: clear to auscultation b/l with normal respiratory effort HEART: regular rate & rhythm ABDOMEN:  normoactive bowel sounds, non tender, not distended. No palpable hepatosplenomegaly. Resolving  Extremity: no pedal edema PSYCH: alert & oriented x 3 with fluent speech NEURO: no focal motor/sensory deficits  LABORATORY DATA:  I have reviewed the data as listed  . CBC Latest Ref Rng & Units 10/10/2018 08/29/2018 07/19/2018  WBC 4.0 - 10.5 K/uL 4.6 5.2 6.3  Hemoglobin 13.0 - 17.0 g/dL 12.8(L) 13.6 12.1(L)  Hematocrit 39.0 - 52.0 % 39.4 40.8 37.5(L)  Platelets 150 - 400 K/uL 191 153 217    . CMP Latest Ref Rng & Units 10/10/2018 08/29/2018 07/19/2018  Glucose 70 - 99 mg/dL 149(H) 100(H) 118(H)  BUN 8 - 23 mg/dL 17 11 12   Creatinine 0.61 - 1.24 mg/dL 1.21 0.99 1.03  Sodium 135 - 145 mmol/L 138 139 139  Potassium 3.5 - 5.1 mmol/L 4.1 4.0 4.0  Chloride 98 - 111 mmol/L 103 108 107  CO2 22 - 32 mmol/L 27 24 24   Calcium 8.9 - 10.3 mg/dL 8.9 9.0 8.2(L)  Total Protein 6.5 - 8.1 g/dL 6.6 6.5 6.1(L)  Total Bilirubin 0.3 - 1.2 mg/dL 1.1 1.4(H) 0.7  Alkaline Phos 38 - 126 U/L 55 69 77  AST 15 - 41 U/L 29 22 18   ALT 0 - 44 U/L 25 19 16    01/05/18 Pathology:    RADIOGRAPHIC STUDIES: I have personally reviewed the radiological images as listed and agreed with the findings in the report. No results found.  ASSESSMENT & PLAN:   78 y.o. male with  1. Primary Testicular Diffuse  Large B-Cell Lymphoma, Stage 1E 11/30/16 NM Myocar Multi w/spect w/wall motion which revealed a LV EF of 44%  12/08/17 US Scrotum revealed Abnormal appearance of the left testicle which is larger than the right and diffusely heterogeneous in echotexture. Cannot exclude infiltrating process/mass. Appearance is concerning for possible neoplasm.  01/05/18 Left testes biopsy revealed Primary Testicular Diffuse Large B-Cell Lymphoma   01/25/18 Hep B, Hep C and HIV negative  01/31/18 ECHO revealed a LV EF of 51%. Left ventricle: The cavity size was normal. Wall motion was normal; there were no regional wall motion abnormalities. Atrial septum: No defect or patent foramen ovale was identified.  02/07/18 PET/CT revealed No findings for metabolically active lymphoma involving the neck, chest, abdomen/pelvis or osseous structures.  Due to admission after first intrathecal methotrexate, concern for brain toxicity given patient's age, and concern for treatment tolerance, decided to hold IT MTX. No further concerns for bleeding, nausea, or vomiting.  08/23/2018 PET scan revealing Mildly hypermetabolic mildly enlarged AP window lymph node, stable since 02/07/2018 PET-CT. Low level metabolism associated with new mild patchy consolidation in the medial segment right middle lobe, potentially Inflammatory. New mildly hypermetabolic right hilar lymph nodes, nonspecific, potentially reactive to the right middle lobe process. Nonspecific patchy hypermetabolism within the right testis is increased. No discrete right testicular mass on the noncontrast CT  Images. These findings are equivocal for active lymphoma. Suggest attention on follow-up PET-CT in 3-6 months. Aortic Atherosclerosis (ICD10-I70.0).  2.  Past Medical History:  Diagnosis Date   Arthritis    Coronary artery disease    HOH (hard of hearing)    Hyperlipidemia    Hypertension    Ischemic cardiomyopathy    Myocardial infarction ([place]) 2016    Quadriceps tendon rupture    LEFT    PLAN: -Discussed pt labwork today, 10/10/18; all values are WNL except for RBC at 4.13, Hgb at 12.8, Glucose at 149. -Discussed pt finishing radiation a couple of weeks ago.  -Discussed that we will monitor at this time with labs and a potential CT scan in 6 months  -Pt advised to contact if any issues arise  -Recommended that the pt continue to eat well, drink at least 48-64 oz of water each day, and walk 20-30 minutes each day.  -Will set up appointment for Flu shot -Confirm Prevnar and Pneumovax vaccine with PCP -Recommended pt not restrict his groin area with clothing so that it can heal quicker -Will see back in 3 months with labs  FOLLOW UP: -flu shot in 2 weeks -RTC with Dr Irene Limbo with labs in 3 months  The total time spent in the appt was 20 minutes and more than 50% was on counseling and direct patient cares.  All of the patient's questions were answered with apparent satisfaction. The patient knows to call the clinic with any problems, questions or concerns.    Sullivan Lone MD [place] AAHIVMS [facility] Hematology/Oncology Physician [facility]  (Office):       [phone] (Work cell):  [phone] (Fax):           [phone]  10/10/2018 8:52 AM  I, Yevette Edwards, am acting as a scribe for Dr. Sullivan Lone.   .I have reviewed the above documentation for accuracy and completeness, and I agree with the above. Brunetta Genera MD

## 2018-10-10 ENCOUNTER — Inpatient Hospital Stay: Payer: Medicare HMO | Attending: Hematology | Admitting: Hematology

## 2018-10-10 ENCOUNTER — Inpatient Hospital Stay: Payer: Medicare HMO

## 2018-10-10 ENCOUNTER — Other Ambulatory Visit: Payer: Self-pay

## 2018-10-10 ENCOUNTER — Telehealth: Payer: Self-pay | Admitting: Hematology

## 2018-10-10 ENCOUNTER — Other Ambulatory Visit: Payer: Self-pay | Admitting: Pharmacist

## 2018-10-10 VITALS — BP 118/86 | HR 66 | Temp 98.3°F | Resp 18 | Ht 73.0 in | Wt 245.2 lb

## 2018-10-10 DIAGNOSIS — Z79899 Other long term (current) drug therapy: Secondary | ICD-10-CM | POA: Insufficient documentation

## 2018-10-10 DIAGNOSIS — R918 Other nonspecific abnormal finding of lung field: Secondary | ICD-10-CM

## 2018-10-10 DIAGNOSIS — C8339 Diffuse large B-cell lymphoma, extranodal and solid organ sites: Secondary | ICD-10-CM

## 2018-10-10 DIAGNOSIS — E785 Hyperlipidemia, unspecified: Secondary | ICD-10-CM | POA: Diagnosis not present

## 2018-10-10 DIAGNOSIS — K5229 Other allergic and dietetic gastroenteritis and colitis: Secondary | ICD-10-CM | POA: Insufficient documentation

## 2018-10-10 DIAGNOSIS — I2510 Atherosclerotic heart disease of native coronary artery without angina pectoris: Secondary | ICD-10-CM | POA: Insufficient documentation

## 2018-10-10 DIAGNOSIS — I252 Old myocardial infarction: Secondary | ICD-10-CM | POA: Insufficient documentation

## 2018-10-10 DIAGNOSIS — C8335 Diffuse large B-cell lymphoma, lymph nodes of inguinal region and lower limb: Secondary | ICD-10-CM | POA: Insufficient documentation

## 2018-10-10 DIAGNOSIS — A059 Bacterial foodborne intoxication, unspecified: Secondary | ICD-10-CM | POA: Insufficient documentation

## 2018-10-10 DIAGNOSIS — I10 Essential (primary) hypertension: Secondary | ICD-10-CM | POA: Diagnosis not present

## 2018-10-10 DIAGNOSIS — N5089 Other specified disorders of the male genital organs: Secondary | ICD-10-CM | POA: Insufficient documentation

## 2018-10-10 LAB — CBC WITH DIFFERENTIAL/PLATELET
Abs Immature Granulocytes: 0.01 10*3/uL (ref 0.00–0.07)
Basophils Absolute: 0 10*3/uL (ref 0.0–0.1)
Basophils Relative: 1 %
Eosinophils Absolute: 0.5 10*3/uL (ref 0.0–0.5)
Eosinophils Relative: 10 %
HCT: 39.4 % (ref 39.0–52.0)
Hemoglobin: 12.8 g/dL — ABNORMAL LOW (ref 13.0–17.0)
Immature Granulocytes: 0 %
Lymphocytes Relative: 22 %
Lymphs Abs: 1 10*3/uL (ref 0.7–4.0)
MCH: 31 pg (ref 26.0–34.0)
MCHC: 32.5 g/dL (ref 30.0–36.0)
MCV: 95.4 fL (ref 80.0–100.0)
Monocytes Absolute: 0.7 10*3/uL (ref 0.1–1.0)
Monocytes Relative: 16 %
Neutro Abs: 2.3 10*3/uL (ref 1.7–7.7)
Neutrophils Relative %: 51 %
Platelets: 191 10*3/uL (ref 150–400)
RBC: 4.13 MIL/uL — ABNORMAL LOW (ref 4.22–5.81)
RDW: 11.7 % (ref 11.5–15.5)
WBC: 4.6 10*3/uL (ref 4.0–10.5)
nRBC: 0 % (ref 0.0–0.2)

## 2018-10-10 LAB — CMP (CANCER CENTER ONLY)
ALT: 25 U/L (ref 0–44)
AST: 29 U/L (ref 15–41)
Albumin: 3.5 g/dL (ref 3.5–5.0)
Alkaline Phosphatase: 55 U/L (ref 38–126)
Anion gap: 8 (ref 5–15)
BUN: 17 mg/dL (ref 8–23)
CO2: 27 mmol/L (ref 22–32)
Calcium: 8.9 mg/dL (ref 8.9–10.3)
Chloride: 103 mmol/L (ref 98–111)
Creatinine: 1.21 mg/dL (ref 0.61–1.24)
GFR, Est AFR Am: >60 mL/min (ref >60)
GFR, Estimated: 56 mL/min — ABNORMAL LOW (ref >60)
Glucose, Bld: 149 mg/dL — ABNORMAL HIGH (ref 70–99)
Potassium: 4.1 mmol/L (ref 3.5–5.1)
Sodium: 138 mmol/L (ref 135–145)
Total Bilirubin: 1.1 mg/dL (ref 0.3–1.2)
Total Protein: 6.6 g/dL (ref 6.5–8.1)

## 2018-10-10 MED ORDER — INFLUENZA VAC A&B SA ADJ QUAD 0.5 ML IM PRSY
0.5000 mL | PREFILLED_SYRINGE | Freq: Once | INTRAMUSCULAR | Status: AC
  Filled 2018-10-10: qty 0.5

## 2018-10-10 MED ORDER — INFLUENZA VAC SPLIT HIGH-DOSE 0.5 ML IM SUSY
0.5000 mL | PREFILLED_SYRINGE | INTRAMUSCULAR | Status: DC
  Filled 2018-10-10: qty 0.5

## 2018-10-10 NOTE — Telephone Encounter (Signed)
Gave avs and calendar ° °

## 2018-10-19 NOTE — Progress Notes (Signed)
Carl [name] presents for follow up of radiation completed 09/22/18 to his Testes. He saw Dr. Isidore Moos on 10/07/18 to have pain and skin irritation to his scrotal area assessed after radiation completed. He is here today for reevaluation. He did see Dr. Irene Limbo on 10/13/18 and will see him again in 3 months. BP 96/62 (BP Location: Right Arm, Patient Position: Sitting)   Pulse 70   Temp 98.3 F (36.8 C) (Temporal)   Wt 242 lb (109.8 kg)   SpO2 98%   BMI 31.93 kg/m  Filed Weights   10/21/18 1006  Weight: 242 lb (109.8 kg)

## 2018-10-21 ENCOUNTER — Other Ambulatory Visit: Payer: Self-pay

## 2018-10-21 ENCOUNTER — Ambulatory Visit
Admission: RE | Admit: 2018-10-21 | Discharge: 2018-10-21 | Disposition: A | Discharge status: Home / Self Care | Payer: Medicare HMO | Source: Ambulatory Visit | Attending: Radiation Oncology | Admitting: Radiation Oncology

## 2018-10-21 ENCOUNTER — Encounter: Payer: Self-pay | Admitting: Radiation Oncology

## 2018-10-21 VITALS — BP 96/62 | HR 70 | Temp 98.3°F | Wt 242.0 lb

## 2018-10-21 DIAGNOSIS — C8339 Diffuse large B-cell lymphoma, extranodal and solid organ sites: Secondary | ICD-10-CM | POA: Diagnosis not present

## 2018-10-21 DIAGNOSIS — Z510 Encounter for antineoplastic radiation therapy: Secondary | ICD-10-CM | POA: Insufficient documentation

## 2018-10-21 NOTE — Progress Notes (Signed)
Radiation Oncology         [phone] ________________________________  Name: Carl [name] MRN: [id number]  Date: 10/21/2018  DOB: [birth year]  Follow-Up Visit Note in person  Outpatient  CC: [name], MD  Brunetta Genera, MD  Diagnosis and Prior Radiotherapy:    ICD-10-CM   1. 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 He is now 4 weeks out from completion of radiation therapy. Since his last visit, he saw Dr. Irene Limbo on 10/10/2018 and reported feeling less fatigue and reports excellent improvement to the groin/scrotum - skin has healed, scrotum no longer swollen. No complaints.                          ALLERGIES:  has No Known Allergies.  Meds: Current Outpatient Medications  Medication Sig Dispense Refill  . aspirin EC 81 MG tablet Take 81 mg by mouth daily.    Marland Kitchen atorvastatin (LIPITOR) 40 MG tablet Take 1 tablet (40 mg total) by mouth daily at 6 PM. 60 tablet 3  . carvedilol (COREG) 6.25 MG tablet Take 3.21 mg by mouth 2 (two) times daily with a meal.     . cholecalciferol (VITAMIN D) 1000 UNITS tablet Take 1,000 Units by mouth daily. He reports 5000 units    . losartan (COZAAR) 25 MG tablet Take 25 mg by mouth every evening.    . Multiple Vitamin (MULTIVITAMIN WITH MINERALS) TABS tablet Take 1 tablet by mouth daily.    . nitroGLYCERIN (NITROSTAT) 0.4 MG SL tablet Place 1 tablet (0.4 mg total) under the tongue every 5 (five) minutes x 3 doses as needed for chest pain. 25 tablet 12  . Polyethyl Glycol-Propyl Glycol (SYSTANE OP) Place 1 drop into both eyes at bedtime.     . polyethylene glycol (MIRALAX) packet Take 17 g by mouth daily. (Patient taking differently: Take 17 g by mouth daily. He reports  he takes it every other day.) 30 each 1  . gabapentin (NEURONTIN) 100 MG capsule 1 CAPSULE BY MOUTH EVERY DAY FOR 1 WK, 1 CAP TWICE A DAY FOR 1 WK, 1 CAP 3 TIMES A DAY THEREAFTER    . ondansetron (ZOFRAN) 8 MG tablet Take 1 tablet (8 mg total) by mouth 2 (two) times daily as needed for refractory nausea / vomiting. Start on day 3 after cyclophosphamide chemotherapy. (Patient not taking: Reported on 08/24/2018) 30 tablet 1  . pantoprazole (PROTONIX) 40 MG tablet Take 1 tablet (40 mg total) by mouth 2 (two) times daily for 30 days. 60 tablet 0  . predniSONE (DELTASONE) 20 MG tablet Take 3 tablets (60 mg total) by mouth daily. Take on days 1-5 of chemotherapy. (Patient not taking: Reported on 08/24/2018) 15 tablet 5  . prochlorperazine (COMPAZINE) 10 MG tablet Take 1 tablet (10 mg total) by mouth every 6 (six) hours as needed (Nausea or vomiting). (Patient not taking: Reported on 08/24/2018) 30 tablet 6  . senna-docusate (SENNA S) 8.6-50 MG tablet Take 2 tablets by mouth at bedtime as needed for mild constipation or moderate constipation. 60 tablet 1  . white petrolatum (VASELINE) GEL Apply 1 application topically as needed for dry skin (itching).     No  current facility-administered medications for this encounter.    Facility-Administered Medications Ordered in Other Encounters  Medication Dose Route Frequency Provider Last Rate Last Dose  . influenza vaccine adjuvanted (FLUAD) injection 0.5 mL  0.5 mL Intramuscular Once Brunetta Genera, MD        Physical Findings: The patient is in no acute distress. Patient is alert and oriented.  weight is 242 lb (109.8 kg). His temporal temperature is 98.3 F (36.8 C). His blood pressure is 96/62 and his pulse is 70. His oxygen saturation is 98%. .    resolution of scrotal edema and erythema; skin is now intact in the scrotum and groin.   Lab Findings: Lab Results  Component Value Date   WBC 4.6 10/10/2018   HGB 12.8 (L) 10/10/2018   HCT 39.4  10/10/2018   MCV 95.4 10/10/2018   PLT 191 10/10/2018    Radiographic Findings: No results found.  Impression/Plan:  Testicular Lymphoma    He has healed well since previous follow-up.  Continue applying radiation healing cream for the next month and longer if he wishes.  He will follow-up with medical oncology and undergo his surveillance imaging with medical oncology.  He will see me back PRN.  He is pleased with this plan and I wished him the best..  I spent 10 minutes face to face with the patient and more than 50% of that time was spent in counseling and/or coordination of care. _____________________________________   Eppie Gibson, MD  This document serves as a record of services personally performed by Eppie Gibson, MD. It was created on her behalf by Wilburn Mylar, a trained medical scribe. The creation of this record is based on the scribe's personal observations and the provider's statements to them. This document has been checked and approved by the attending provider.

## 2018-10-21 NOTE — Patient Instructions (Signed)
Coronavirus (COVID-19) Are you at risk?  Are you at risk for the Coronavirus (COVID-19)?  To be considered HIGH RISK for Coronavirus (COVID-19), you have to meet the following criteria:  . Traveled to [place], [place], [place], [place] or [place]; or in the [place] to [place], [place], [place], or [place]; and have fever, cough, and shortness of breath within the last 2 weeks of travel OR . Been in close contact with a person diagnosed with COVID-19 within the last 2 weeks and have fever, cough, and shortness of breath . IF YOU DO NOT MEET THESE CRITERIA, YOU ARE CONSIDERED LOW RISK FOR COVID-19.  What to do if you are HIGH RISK for COVID-19?  . If you are having a medical emergency, call 911. . Seek medical care right away. Before you go to a doctor's office, urgent care or emergency department, call ahead and tell them about your recent travel, contact with someone diagnosed with COVID-19, and your symptoms. You should receive instructions from your physician's office regarding next steps of care.  . When you arrive at healthcare provider, tell the healthcare staff immediately you have returned from visiting [place], [place], [place], [place] or [place]; or traveled in the [place] to [place], [place], [place], or [place]; in the last two weeks or you have been in close contact with a person diagnosed with COVID-19 in the last 2 weeks.   . Tell the health care staff about your symptoms: fever, cough and shortness of breath. . After you have been seen by a medical provider, you will be either: o Tested for (COVID-19) and discharged home on quarantine except to seek medical care if symptoms worsen, and asked to  - Stay home and avoid contact with others until you get your results (4-5 days)  - Avoid travel on public transportation if possible (such as bus, train, or airplane) or o Sent to the Emergency Department by EMS for evaluation, COVID-19 testing, and possible  admission depending on your condition and test results.  What to do if you are LOW RISK for COVID-19?  Reduce your risk of any infection by using the same precautions used for avoiding the common cold or flu:  . Wash your hands often with soap and warm water for at least 20 seconds.  If soap and water are not readily available, use an alcohol-based hand sanitizer with at least 60% alcohol.  . If coughing or sneezing, cover your mouth and nose by coughing or sneezing into the elbow areas of your shirt or coat, into a tissue or into your sleeve (not your hands). . Avoid shaking hands with others and consider head nods or verbal greetings only. . Avoid touching your eyes, nose, or mouth with unwashed hands.  . Avoid close contact with people who are sick. . Avoid places or events with large numbers of people in one location, like concerts or sporting events. . Carefully consider travel plans you have or are making. . If you are planning any travel outside or inside the [place], visit the CDC's Travelers' Health webpage for the latest health notices. . If you have some symptoms but not all symptoms, continue to monitor at home and seek medical attention if your symptoms worsen. . If you are having a medical emergency, call 911.   ADDITIONAL HEALTHCARE OPTIONS FOR PATIENTS  [facility] / e-Visit: [email or web address]         [facility]: [phone]  [facility]: [phone]                   [facility]: [phone]   

## 2018-10-24 ENCOUNTER — Inpatient Hospital Stay: Payer: Medicare HMO | Attending: Hematology

## 2018-10-24 ENCOUNTER — Other Ambulatory Visit: Payer: Self-pay | Admitting: Hematology

## 2018-10-24 ENCOUNTER — Other Ambulatory Visit: Payer: Self-pay

## 2018-10-24 DIAGNOSIS — Z23 Encounter for immunization: Secondary | ICD-10-CM | POA: Diagnosis not present

## 2018-10-24 MED ORDER — INFLUENZA VAC A&B SA ADJ QUAD 0.5 ML IM PRSY
PREFILLED_SYRINGE | INTRAMUSCULAR | Status: AC
  Filled 2018-10-24: qty 0.5

## 2018-10-24 MED ORDER — INFLUENZA VAC A&B SA ADJ QUAD 0.5 ML IM PRSY
0.5000 mL | PREFILLED_SYRINGE | Freq: Once | INTRAMUSCULAR | Status: AC
  Administered 2018-10-24: 0.5 mL via INTRAMUSCULAR

## 2018-10-24 NOTE — Patient Instructions (Signed)
Influenza Virus Vaccine injection What is this medicine? INFLUENZA VIRUS VACCINE (in floo EN zuh VAHY ruhs vak SEEN) helps to reduce the risk of getting influenza also known as the flu. The vaccine only helps protect you against some strains of the flu. This medicine may be used for other purposes; ask your health care provider or pharmacist if you have questions. COMMON BRAND NAME(S): Afluria, Afluria Quadrivalent, Agriflu, Alfuria, FLUAD, Fluarix, Fluarix Quadrivalent, Flublok, Flublok Quadrivalent, FLUCELVAX, Flulaval, Fluvirin, Fluzone, Fluzone High-Dose, Fluzone Intradermal What should I tell my health care provider before I take this medicine? They need to know if you have any of these conditions:  bleeding disorder like hemophilia  fever or infection  Guillain-Barre syndrome or other neurological problems  immune system problems  infection with the human immunodeficiency virus (HIV) or AIDS  low blood platelet counts  multiple sclerosis  an unusual or allergic reaction to influenza virus vaccine, latex, other medicines, foods, dyes, or preservatives. Different brands of vaccines contain different allergens. Some may contain latex or eggs. Talk to your doctor about your allergies to make sure that you get the right vaccine.  pregnant or trying to get pregnant  breast-feeding How should I use this medicine? This vaccine is for injection into a muscle or under the skin. It is given by a health care professional. A copy of Vaccine Information Statements will be given before each vaccination. Read this sheet carefully each time. The sheet may change frequently. Talk to your healthcare provider to see which vaccines are right for you. Some vaccines should not be used in all age groups. Overdosage: If you think you have taken too much of this medicine contact a poison control center or emergency room at once. NOTE: This medicine is only for you. Do not share this medicine with  others. What if I miss a dose? This does not apply. What may interact with this medicine?  chemotherapy or radiation therapy  medicines that lower your immune system like etanercept, anakinra, infliximab, and adalimumab  medicines that treat or prevent blood clots like warfarin  phenytoin  steroid medicines like prednisone or cortisone  theophylline  vaccines This list may not describe all possible interactions. Give your health care provider a list of all the medicines, herbs, non-prescription drugs, or dietary supplements you use. Also tell them if you smoke, drink alcohol, or use illegal drugs. Some items may interact with your medicine. What should I watch for while using this medicine? Report any side effects that do not go away within 3 days to your doctor or health care professional. Call your health care provider if any unusual symptoms occur within 6 weeks of receiving this vaccine. You may still catch the flu, but the illness is not usually as bad. You cannot get the flu from the vaccine. The vaccine will not protect against colds or other illnesses that may cause fever. The vaccine is needed every year. What side effects may I notice from receiving this medicine? Side effects that you should report to your doctor or health care professional as soon as possible:  allergic reactions like skin rash, itching or hives, swelling of the face, lips, or tongue Side effects that usually do not require medical attention (report to your doctor or health care professional if they continue or are bothersome):  fever  headache  muscle aches and pains  pain, tenderness, redness, or swelling at the injection site  tiredness This list may not describe all possible side effects. Call your   doctor for medical advice about side effects. You may report side effects to FDA at 1-800-FDA-1088. Where should I keep my medicine? The vaccine will be given by a health care professional in a  clinic, pharmacy, doctor's office, or other health care setting. You will not be given vaccine doses to store at home. NOTE: This sheet is a summary. It may not cover all possible information. If you have questions about this medicine, talk to your doctor, pharmacist, or health care provider.  2020 [employer] Standard (2017-12-21 08:45:43)  

## 2018-10-26 ENCOUNTER — Ambulatory Visit: Payer: Self-pay | Admitting: Radiation Oncology

## 2018-11-17 DIAGNOSIS — I255 Ischemic cardiomyopathy: Secondary | ICD-10-CM | POA: Diagnosis not present

## 2018-11-17 DIAGNOSIS — E785 Hyperlipidemia, unspecified: Secondary | ICD-10-CM | POA: Diagnosis not present

## 2018-11-17 DIAGNOSIS — I2510 Atherosclerotic heart disease of native coronary artery without angina pectoris: Secondary | ICD-10-CM | POA: Diagnosis not present

## 2018-11-17 DIAGNOSIS — M1990 Unspecified osteoarthritis, unspecified site: Secondary | ICD-10-CM | POA: Diagnosis not present

## 2018-11-17 DIAGNOSIS — R7303 Prediabetes: Secondary | ICD-10-CM | POA: Diagnosis not present

## 2018-11-17 DIAGNOSIS — I10 Essential (primary) hypertension: Secondary | ICD-10-CM | POA: Diagnosis not present

## 2018-12-08 DIAGNOSIS — Z0000 Encounter for general adult medical examination without abnormal findings: Secondary | ICD-10-CM | POA: Diagnosis not present

## 2018-12-26 ENCOUNTER — Other Ambulatory Visit: Payer: Self-pay

## 2018-12-26 ENCOUNTER — Emergency Department (HOSPITAL_COMMUNITY): Payer: No Typology Code available for payment source

## 2018-12-26 ENCOUNTER — Encounter (HOSPITAL_COMMUNITY): Payer: Self-pay

## 2018-12-26 ENCOUNTER — Emergency Department (HOSPITAL_COMMUNITY)
Admission: EM | Admit: 2018-12-26 | Discharge: 2018-12-27 | Disposition: A | Discharge status: Home / Self Care | Payer: No Typology Code available for payment source | Attending: Emergency Medicine | Admitting: Emergency Medicine

## 2018-12-26 DIAGNOSIS — R079 Chest pain, unspecified: Secondary | ICD-10-CM | POA: Insufficient documentation

## 2018-12-26 DIAGNOSIS — R0602 Shortness of breath: Secondary | ICD-10-CM | POA: Insufficient documentation

## 2018-12-26 DIAGNOSIS — J189 Pneumonia, unspecified organism: Secondary | ICD-10-CM | POA: Diagnosis not present

## 2018-12-26 DIAGNOSIS — R0781 Pleurodynia: Secondary | ICD-10-CM | POA: Diagnosis not present

## 2018-12-26 DIAGNOSIS — I2510 Atherosclerotic heart disease of native coronary artery without angina pectoris: Secondary | ICD-10-CM | POA: Diagnosis not present

## 2018-12-26 DIAGNOSIS — Z79899 Other long term (current) drug therapy: Secondary | ICD-10-CM | POA: Insufficient documentation

## 2018-12-26 DIAGNOSIS — I10 Essential (primary) hypertension: Secondary | ICD-10-CM | POA: Diagnosis not present

## 2018-12-26 DIAGNOSIS — Z7982 Long term (current) use of aspirin: Secondary | ICD-10-CM | POA: Insufficient documentation

## 2018-12-26 DIAGNOSIS — J90 Pleural effusion, not elsewhere classified: Secondary | ICD-10-CM | POA: Diagnosis not present

## 2018-12-26 LAB — BASIC METABOLIC PANEL
Anion gap: 10 (ref 5–15)
BUN: 14 mg/dL (ref 8–23)
CO2: 24 mmol/L (ref 22–32)
Calcium: 8.8 mg/dL — ABNORMAL LOW (ref 8.9–10.3)
Chloride: 106 mmol/L (ref 98–111)
Creatinine, Ser: 0.93 mg/dL (ref 0.61–1.24)
GFR calc Af Amer: >60 mL/min (ref >60)
GFR calc non Af Amer: >60 mL/min (ref >60)
Glucose, Bld: 88 mg/dL (ref 70–99)
Potassium: 3.7 mmol/L (ref 3.5–5.1)
Sodium: 140 mmol/L (ref 135–145)

## 2018-12-26 LAB — CBC
HCT: 41.8 % (ref 39.0–52.0)
Hemoglobin: 13.2 g/dL (ref 13.0–17.0)
MCH: 31.4 pg (ref 26.0–34.0)
MCHC: 31.6 g/dL (ref 30.0–36.0)
MCV: 99.3 fL (ref 80.0–100.0)
Platelets: UNDETERMINED 10*3/uL (ref 150–400)
RBC: 4.21 MIL/uL — ABNORMAL LOW (ref 4.22–5.81)
RDW: 11.9 % (ref 11.5–15.5)
WBC: 6.7 10*3/uL (ref 4.0–10.5)
nRBC: 0 % (ref 0.0–0.2)

## 2018-12-26 LAB — TROPONIN I (HIGH SENSITIVITY)
Troponin I (High Sensitivity): 24 ng/L — ABNORMAL HIGH (ref <18)
Troponin I (High Sensitivity): 22 ng/L — ABNORMAL HIGH (ref <18)

## 2018-12-26 MED ORDER — SODIUM CHLORIDE 0.9% FLUSH: 3.0000 mL | Freq: Once | INTRAVENOUS | Status: DC

## 2018-12-26 MED ORDER — IOHEXOL 300 MG/ML  SOLN: 100.0000 mL | Freq: Once | INTRAMUSCULAR | Status: DC | PRN

## 2018-12-26 MED ORDER — IOHEXOL 350 MG/ML SOLN
100.0000 mL | Freq: Once | INTRAVENOUS | Status: AC | PRN
  Administered 2018-12-26: 100 mL via INTRAVENOUS

## 2018-12-26 NOTE — ED Triage Notes (Signed)
Patient c/o intermittent chest pain and difficulty breathing at times x 3 days. Right chest pain worse when he "breathes in."  Patient states he has indigestion and sometimes the pain is better when he "belches."

## 2018-12-26 NOTE — ED Provider Notes (Signed)
[facility] DEPT Provider Note   CSN: [id number] Arrival date & time: 12/26/18  1239     History   Chief Complaint Chief Complaint  Patient presents with  . Chest Pain  . Shortness of Breath    HPI Carl [name] is a 78 y.o. 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 Pain waxes and wanes and as bad as an 8/10 at times He states that his pain is relieved by pressing on it and belching. Denies pain right now, unless he takes a deep breath. Worsened by eating, causing him to feel more bloated and belch more frequently. Pain does not radiate. He was previously told that he had GERD and was given PPI, but is not currently taking it. Denies chest pain or shortness of breath with exertion. He states that the pain is different from his previous MI. Patient had an echo a year ago that showed an ejection fraction of 51% He denies chest pain and shortness of breath with exertion, leg swelling/pain, orthopnea.   HPI  Past Medical History:  Diagnosis Date  . Arthritis   . Coronary artery disease   . HOH (hard of hearing)   . Hyperlipidemia   . Hypertension   . Ischemic cardiomyopathy   . Myocardial infarction ([place]) 2016  . Quadriceps tendon rupture    LEFT    Patient Active Problem List   Diagnosis Date Noted  . Nausea   . Acute GI bleeding 04/26/2018  . CAD (coronary artery disease) 04/26/2018  . Port-A-Cath in place 03/22/2018  . 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 Acute anterolateral wall MI ([place]) 10/15/2014  . Left quad muscle rupture 02/20/2014    Past Surgical History:  Procedure Laterality Date  . BIOPSY  04/27/2018   Procedure: BIOPSY;  Surgeon: Ronnette Juniper, MD;  Location: [facility];  Service: Gastroenterology;;   . CARDIAC CATHETERIZATION N/A 10/15/2014   Procedure: Left Heart Cath and Coronary Angiography;  Surgeon: Charolette Forward, MD;  Location: [facility];  Service: Cardiovascular;  Laterality: N/A;  . COLONOSCOPY    . ESOPHAGOGASTRODUODENOSCOPY (EGD) WITH PROPOFOL N/A 04/27/2018   Procedure: ESOPHAGOGASTRODUODENOSCOPY (EGD) WITH PROPOFOL;  Surgeon: Ronnette Juniper, MD;  Location: [facility];  Service: Gastroenterology;  Laterality: N/A;  . IR IMAGING GUIDED PORT INSERTION  03/14/2018  . MICROLARYNGOSCOPY WITH CO2 LASER AND EXCISION OF VOCAL CORD LESION N/A 11/26/2017   Procedure: MICROLARYNGOSCOPY WITH CO2 LASER AND EXCISION OF VOCAL CORD LESION WITH JET VENTILATION;  Surgeon: Melida Quitter, MD;  Location: [facility];  Service: ENT;  Laterality: N/A;  . QUADRICEPS TENDON REPAIR Left 02/20/2014   Procedure: LEFT REPAIR QUADRICEP TENDON;  Surgeon: Mauri Pole, MD;  Location: [facility];  Service: Orthopedics;  Laterality: Left;  . THROAT SURGERY  2010        Home Medications    Prior to Admission medications   Medication Sig Start Date End Date Taking? Authorizing Provider  aspirin EC 81 MG tablet Take 81 mg by mouth daily.   Yes [provider]  atorvastatin (LIPITOR) 40 MG tablet Take 1 tablet (40 mg total) by mouth daily at 6 PM. 10/19/14  Yes Charolette Forward, MD  Calcium Carbonate-Vit D-Min (CALCIUM 1200 PO) Take 1 tablet by mouth daily after breakfast.  Yes [provider]  carvedilol (COREG) 6.25 MG tablet Take 3.125 mg by mouth 2 (two) times daily with a meal.    Yes [provider]  cholecalciferol (VITAMIN D) 1000 UNITS tablet Take 5,000 Units by mouth daily.    Yes [provider]  losartan (COZAAR) 25 MG tablet Take 25 mg by mouth daily after breakfast.    Yes [provider]  Multiple Vitamin (MULTIVITAMIN WITH MINERALS) TABS tablet Take 1 tablet by mouth daily.   Yes [provider]  nitroGLYCERIN (NITROSTAT) 0.4 MG SL tablet Place 1  tablet (0.4 mg total) under the tongue every 5 (five) minutes x 3 doses as needed for chest pain. 10/19/14  Yes Charolette Forward, MD  Polyethyl Glycol-Propyl Glycol (SYSTANE OP) Place 1 drop into both eyes at bedtime.    Yes [provider]  white petrolatum (VASELINE) GEL Apply 1 application topically as needed for dry skin (itching).   Yes [provider]  amoxicillin-clavulanate (AUGMENTIN) 500-125 MG tablet Take 1 tablet (500 mg total) by mouth 3 (three) times daily for 5 days. 12/27/18 01/01/19  Marianna Payment, MD  ondansetron (ZOFRAN) 8 MG tablet Take 1 tablet (8 mg total) by mouth 2 (two) times daily as needed for refractory nausea / vomiting. Start on day 3 after cyclophosphamide chemotherapy. Patient not taking: Reported on 08/24/2018 04/19/18   Brunetta Genera, MD  polyethylene glycol [facility]) packet Take 17 g by mouth daily. Patient not taking: Reported on 12/26/2018 03/22/18   Brunetta Genera, MD  predniSONE (DELTASONE) 20 MG tablet Take 3 tablets (60 mg total) by mouth daily. Take on days 1-5 of chemotherapy. Patient not taking: Reported on 08/24/2018 03/03/18   Brunetta Genera, MD  prochlorperazine (COMPAZINE) 10 MG tablet Take 1 tablet (10 mg total) by mouth every 6 (six) hours as needed (Nausea or vomiting). Patient not taking: Reported on 08/24/2018 03/03/18   Brunetta Genera, MD  senna-docusate (SENNA S) 8.6-50 MG tablet Take 2 tablets by mouth at bedtime as needed for mild constipation or moderate constipation. Patient not taking: Reported on 12/26/2018 03/22/18   Brunetta Genera, MD    Family History History reviewed. No pertinent family history.  Social History Social History   Tobacco Use  . Smoking status: Never Smoker  . Smokeless tobacco: Never Used  Substance Use Topics  . Alcohol use: Not Currently  . Drug use: No     Allergies   Patient has no known allergies.   Review of Systems Review of Systems  Constitutional:  Negative for fatigue and fever.  HENT: Negative for trouble swallowing.   Respiratory: Negative for cough, chest tightness and shortness of breath.   Cardiovascular: Positive for chest pain. Negative for palpitations and leg swelling.  Gastrointestinal: Positive for abdominal pain. Negative for abdominal distention (bloating), constipation, diarrhea, nausea and vomiting.     Physical Exam Updated Vital Signs BP 129/81   Pulse 71   Temp 98.3 F (36.8 C) (Oral)   Resp 11   Ht 6\' 1"  (1.854 m)   Wt 106.6 kg   SpO2 99%   BMI 31.00 kg/m   Physical Exam Constitutional:      Appearance: He is well-developed.  HENT:     Head: Normocephalic and atraumatic.  Eyes:     Extraocular Movements: Extraocular movements intact.  Neck:     Musculoskeletal: Normal range of motion.  Cardiovascular:     Rate and Rhythm: Normal rate and regular rhythm.  Heart sounds: Normal heart sounds. No murmur. No friction rub. No gallop.   Pulmonary:     Effort: Pulmonary effort is normal.     Breath sounds: Normal breath sounds.  Chest:     Chest wall: Tenderness (right sided without tenderness to palpation) present.  Abdominal:     General: Bowel sounds are normal.     Palpations: Abdomen is soft.     Tenderness: There is no abdominal tenderness.  Musculoskeletal: Normal range of motion.     Right lower leg: He exhibits no tenderness. No edema.  Skin:    General: Skin is warm and dry.     Findings: No rash.  Neurological:     General: No focal deficit present.     Mental Status: He is alert and oriented to person, place, and time.      ED Treatments / Results  Labs (all labs ordered are listed, but only abnormal results are displayed) Labs Reviewed  BASIC METABOLIC PANEL - Abnormal; Notable for the following components:      Result Value   Calcium 8.8 (*)    All other components within normal limits  CBC - Abnormal; Notable for the following components:   RBC 4.21 (*)    All other  components within normal limits  TROPONIN I (HIGH SENSITIVITY) - Abnormal; Notable for the following components:   Troponin I (High Sensitivity) 24 (*)    All other components within normal limits  TROPONIN I (HIGH SENSITIVITY) - Abnormal; Notable for the following components:   Troponin I (High Sensitivity) 22 (*)    All other components within normal limits    EKG EKG Interpretation  Date/Time:  Monday December 26 2018 12:49:28 EST Ventricular Rate:  80 PR Interval:    QRS Duration: 84 QT Interval:  368 QTC Calculation: 425 R Axis:   -20 Text Interpretation: Sinus rhythm Consider right atrial enlargement Low voltage, extremity and precordial leads Anteroseptal infarct, old No significant change since last tracing Confirmed by Gareth Morgan [phone]) on 12/26/2018 6:35:46 PM   Radiology Dg Chest 2 View  Result Date: 12/26/2018 CLINICAL DATA:  Intermittent chest pain and shortness of breath over the last 3 days. Recent treatment for lymphoma. EXAM: CHEST - 2 VIEW COMPARISON:  04/25/2018 FINDINGS: Heart size is normal. Mediastinal shadows are normal. Power port inserted from a right internal jugular approach has its tip in the SVC 2 cm above the right atrium. The left chest is clear. There is a small amount pleural fluid on the right with minimal right base atelectasis. Minimal chronic spinal curvature without acute bone finding. IMPRESSION: Small amount of pleural fluid newly seen on the right with mild right base atelectasis. Electronically Signed   By: Nelson Chimes M.D.   On: 12/26/2018 13:59   Ct Angio Chest Pe W And/or Wo Contrast  Result Date: 12/26/2018 CLINICAL DATA:  Chest pain, difficulty breathing EXAM: CT ANGIOGRAPHY CHEST WITH CONTRAST TECHNIQUE: Multidetector CT imaging of the chest was performed using the standard protocol during bolus administration of intravenous contrast. Multiplanar CT image reconstructions and MIPs were obtained to evaluate the vascular anatomy.  CONTRAST:  161mL OMNIPAQUE IOHEXOL 350 MG/ML SOLN COMPARISON:  Chest x-ray today. FINDINGS: Cardiovascular: No filling defects in the pulmonary arteries to suggest pulmonary emboli. Coronary artery atherosclerosis. Scattered aortic calcifications as well. Heart is normal size. Aorta is normal caliber. Mediastinum/Nodes: No mediastinal, hilar, or axillary adenopathy. Lungs/Pleura: Trace right pleural effusion. Right basilar opacity. Airspace opacity medially in the anterior  right upper lobe. No confluent opacity on the left. Upper Abdomen: Imaging into the upper abdomen shows no acute findings. Musculoskeletal: Chest wall soft tissues are unremarkable. No acute bony abnormality. Review of the MIP images confirms the above findings. IMPRESSION: No evidence of pulmonary embolus. Trace right pleural effusion. Right basilar and medial anterior right upper lobe airspace opacities could reflect atelectasis or infiltrate/pneumonia. Coronary artery disease. Aortic Atherosclerosis (ICD10-I70.0). Electronically Signed   By: Rolm Baptise M.D.   On: 12/26/2018 23:37    Procedures Procedures (including critical care time)  Medications Ordered in ED Medications  sodium chloride flush (NS) 0.9 % injection 3 mL (has no administration in time range)  iohexol (OMNIPAQUE) 300 MG/ML solution 100 mL (has no administration in time range)  iohexol (OMNIPAQUE) 350 MG/ML injection 100 mL (100 mLs Intravenous Contrast Given 12/26/18 2320)     Initial Impression / Assessment and Plan / ED Course  I have reviewed the triage vital signs and the nursing notes.  Pertinent labs & imaging results that were available during my care of the patient were reviewed by me and considered in my medical decision making (see chart for details).  EKG: Sinus rhythm Ventricular premature complex Low voltage, extremity and precordial leads Probable anteroseptal infarct, old  CXR: Small amount of pleural fluid newly seen on the right with  mild right base atelectasis.  CTA: No evidence of pulmonary embolus. Trace right pleural effusion. Right basilar and medial anterior right upper lobe airspace opacities could reflect atelectasis or infiltrate/pneumonia. Coronary artery disease. Aortic Atherosclerosis (ICD10-I70.0).   Carl [name] is a 78 y.o. male who presented with a 3 day hx of right-sided pleuritic chest pain without shortness of breath likely secondary to GERD.   Chest pain: - EKG (see above) - Troponin 22 then 24  - CXR trace right sided pleural effusion  - CTA negative for PE - Likely discharge with close follow up with PCP  Pleural Effusion: - Will give amoxicillin-clavulanate for 5 day to empirically treat CAP - He will need to follow up with his PCP to determine if his right pleural effusion needs to be tapped in the setting of recent [facility] lymphoma.   Final Clinical Impressions(s) / ED Diagnoses   Final diagnoses:  Chest pain, unspecified type  Pleural effusion    ED Discharge Orders         Ordered    amoxicillin-clavulanate (AUGMENTIN) 500-125 MG tablet  3 times daily     12/27/18 0006           Marianna Payment, MD 12/27/18 0006    Gareth Morgan, MD 12/28/18 2248

## 2018-12-27 MED ORDER — AMOXICILLIN-POT CLAVULANATE 500-125 MG PO TABS: 1.0000 | ORAL_TABLET | Freq: Three times a day (TID) | ORAL | 0 refills | Status: AC

## 2018-12-27 MED ORDER — AZITHROMYCIN 250 MG PO TABS: 250.0000 mg | ORAL_TABLET | Freq: Every day | ORAL | 0 refills | Status: DC

## 2018-12-27 NOTE — Discharge Instructions (Signed)
Thank you, [name] for allowing [place] to provide your care today. Today we worked up your chest pain.     I have ordered the following medication/changed the following medications: amoxicillin-clavulanate 3 times a day for 5 days.  Please follow-up in with your PCP in a week.    Should you have any questions or concerns please call the ED at [facility]

## 2019-01-02 DIAGNOSIS — J189 Pneumonia, unspecified organism: Secondary | ICD-10-CM | POA: Diagnosis not present

## 2019-01-02 DIAGNOSIS — R197 Diarrhea, unspecified: Secondary | ICD-10-CM | POA: Diagnosis not present

## 2019-01-02 DIAGNOSIS — Z09 Encounter for follow-up examination after completed treatment for conditions other than malignant neoplasm: Secondary | ICD-10-CM | POA: Diagnosis not present

## 2019-01-03 ENCOUNTER — Telehealth: Payer: Self-pay | Admitting: *Deleted

## 2019-01-03 NOTE — Telephone Encounter (Signed)
Contacted by pt PCP (Dr. Myriam Jacobson) [facility] office. He saw patient on 11/23. OV note being faxed. Dr. Harrington Challenger wanted Dr. Irene Limbo to be aware - CT 11/16 in ED showed 'trace right pleural effusion''. He was not sure when patient next appt was.  Contacted office - informed them his current appt with you is on 12/1.

## 2019-01-09 NOTE — Progress Notes (Signed)
HEMATOLOGY/ONCOLOGY CLINIC NOTE  Date of Service: 01/10/2019  Patient Care Team: [name], MD as PCP - General (Family Medicine)  CHIEF COMPLAINTS/PURPOSE OF CONSULTATION:  Primary testicular diffuse Large B-Cell Lymphoma   HISTORY OF PRESENTING ILLNESS:   Carl [name] is a wonderful 78 y.o. male who has been referred to [place] by Dr. [name] for evaluation and management of Diffuse Large B-Cell Lymphoma. The pt reports that he is doing well overall.  The pt notes that he first began feeling differently about 4-5 months ago, when he noticed left testicular swelling. He notes that the his testicle was "a little bit larger than a golf ball," and that this was not painful. The pt notes that he has not had any abdominal distension or abdominal pain. The pt denies any fevers, chills, night sweats, or unexpected weight loss. He also denies any leg swelling. The pt denies any trauma sustained by the testicles. He denies noticing any other related symptoms.   The pt reports that he believed he had food poisoning prior to this, as he had lots of diarrhea. He notes that he has observed a small knot on the back of his right wrist, which has not changed over the last year.  The pt has two stents in his heart which were placed in 2016. The pt notes that his blood pressure has been stable and well-controlled. He notes that his last ECHO was 6 months ago, and he has not had any CP or SOB since that time. He takes 81mg  aspirin daily. He also takes Atorvastatin. The pt denies feeling limited in his walking by SOB or CP. He notes that he could walk 2 miles before ceasing to walk, noting that feeling tired in his muscles would limit him.   The pt has recently pursued surgery on his throat with ENT Dr. Melida Quitter for altered voice and hoarseness related to his vocal cords.   The pt notes that he currently lives by himself, and is not married. He has a daughter who is 69 years old. He  notes that his daughter "tries to help out." He notes that he does not have a power of attorney, but that this would be his daughter.   Of note prior to the patient's visit today, pt has had a Left Testis Surgical biopsy completed on 01/05/18 with results revealing Diffuse Large B-Cell Lymphoma.   The pt also had an US Scrotum on 12/08/17 which revealed Abnormal appearance of the left testicle which is larger than the right and diffusely heterogeneous in echotexture. Cannot exclude infiltrating process/mass. Appearance is concerning for possible neoplasm.  Most recent lab results (11/26/17) of BMP revealed all values WNL except for Glucose at 104, Creatinine at 1.30, Calcium at 8.7, GFR at 59 11/26/17 Hemoglobin value at 13.6.   On review of systems, pt reports stable energy levels, left testicular swelling, and denies fevers, chills, night sweats, unexpected weight loss, CP, SOB, back pains, flank pains, abdominal pains, leg swelling, testicular pain, and any other symptoms.   On PMHx the pt reports CAD, two heart stents placed in 2016. On Social Hx the pt denies smoking.  On Family Hx the pt reports mother with lymphoma of the breast. Two sisters with breast cancer, one at age 42.   INTERVAL HISTORY:   Carl [name] returns today for management and evaluation of his Primary testicular Diffuse Large B-Cell Lymphoma. The patient's last visit with [place] was on 10/10/2018. The pt  reports that he is doing well overall.  The pt reports that he had to go to the ED for chest pain. Pt had pneumonia and a pleural effusion. He was given an antibiotic and his symptoms have since resolved. Pt is currently experiencing discomfort around his nipple area as well as itching around his groin region. He does not have a rash in his groin. Pt has continued to have some stomach discomfort but was told by his PCP to eat foods that are easy to digest and continues to take his probiotics. He has continued to  distance himself and take safety precautions in light of the pandemic. Pt recently had a small fall that scratched the inside of his right arm quite severely. It has since healed and is feeling better. Pt is unsure if he has had a Tetanus booster in the last few years. He was unable to walk after his chest began hurting but he is working on beginning to walk again.   Lab results today (01/10/19) of CBC w/diff and CMP is as follows: all values are WNL except for Glucose at 152, Total Bilirubin at 1.3. 01/10/2019 LDH at 162  On review of systems, pt reports nipple discomfort, itching around groin, resolved chest pain and denies SOB, fevers, chills, leg swelling, other infection symptoms, headaches, testicular pain/swelling, abdominal pain, groin rash and any other symptoms.   MEDICAL HISTORY:  Past Medical History:  Diagnosis Date  . Arthritis   . Coronary artery disease   . HOH (hard of hearing)   . Hyperlipidemia   . Hypertension   . Ischemic cardiomyopathy   . Myocardial infarction ([place]) 2016  . Quadriceps tendon rupture    LEFT    SURGICAL HISTORY: Past Surgical History:  Procedure Laterality Date  . BIOPSY  04/27/2018   Procedure: BIOPSY;  Surgeon: Ronnette Juniper, MD;  Location: [facility];  Service: Gastroenterology;;  . CARDIAC CATHETERIZATION N/A 10/15/2014   Procedure: Left Heart Cath and Coronary Angiography;  Surgeon: Charolette Forward, MD;  Location: [facility];  Service: Cardiovascular;  Laterality: N/A;  . COLONOSCOPY    . ESOPHAGOGASTRODUODENOSCOPY (EGD) WITH PROPOFOL N/A 04/27/2018   Procedure: ESOPHAGOGASTRODUODENOSCOPY (EGD) WITH PROPOFOL;  Surgeon: Ronnette Juniper, MD;  Location: [facility];  Service: Gastroenterology;  Laterality: N/A;  . IR IMAGING GUIDED PORT INSERTION  03/14/2018  . MICROLARYNGOSCOPY WITH CO2 LASER AND EXCISION OF VOCAL CORD LESION N/A 11/26/2017   Procedure: MICROLARYNGOSCOPY WITH CO2 LASER AND EXCISION OF VOCAL CORD LESION WITH JET VENTILATION;   Surgeon: Melida Quitter, MD;  Location: [facility];  Service: ENT;  Laterality: N/A;  . QUADRICEPS TENDON REPAIR Left 02/20/2014   Procedure: LEFT REPAIR QUADRICEP TENDON;  Surgeon: Mauri Pole, MD;  Location: [facility];  Service: Orthopedics;  Laterality: Left;  . THROAT SURGERY  2010    SOCIAL HISTORY: Social History   Socioeconomic History  . Marital status: Divorced    Spouse name: Not on file  . Number of children: Not on file  . Years of education: Not on file  . Highest education level: Not on file  Occupational History  . Not on file  Social Needs  . Financial resource strain: Not on file  . Food insecurity    Worry: Not on file    Inability: Not on file  . Transportation needs    Medical: No    Non-medical: No  Tobacco Use  . Smoking status: Never Smoker  . Smokeless tobacco: Never Used  Substance and Sexual  Activity  . Alcohol use: Not Currently  . Drug use: No  . Sexual activity: Not on file  Lifestyle  . Physical activity    Days per week: Not on file    Minutes per session: Not on file  . Stress: Not on file  Relationships  . Social Herbalist on phone: Not on file    Gets together: Not on file    Attends religious service: Not on file    Active member of club or organization: Not on file    Attends meetings of clubs or organizations: Not on file    Relationship status: Not on file  . Intimate partner violence    Fear of current or ex partner: No    Emotionally abused: No    Physically abused: No    Forced sexual activity: No  Other Topics Concern  . Not on file  Social History Narrative  . Not on file    FAMILY HISTORY: No family history on file.  ALLERGIES:  has No Known Allergies.  MEDICATIONS:  Current Outpatient Medications  Medication Sig Dispense Refill  . aspirin EC 81 MG tablet Take 81 mg by mouth daily.    Marland Kitchen atorvastatin (LIPITOR) 40 MG tablet Take 1 tablet (40 mg total) by mouth daily at 6 PM. 60 tablet 3  . azithromycin  (ZITHROMAX) 250 MG tablet Take 1 tablet (250 mg total) by mouth daily. Take first 2 tablets together, then 1 every day until finished. 6 tablet 0  . Calcium Carbonate-Vit D-Min (CALCIUM 1200 PO) Take 1 tablet by mouth daily after breakfast.    . carvedilol (COREG) 6.25 MG tablet Take 3.125 mg by mouth 2 (two) times daily with a meal.     . cholecalciferol (VITAMIN D) 1000 UNITS tablet Take 5,000 Units by mouth daily.     Marland Kitchen losartan (COZAAR) 25 MG tablet Take 25 mg by mouth daily after breakfast.     . Multiple Vitamin (MULTIVITAMIN WITH MINERALS) TABS tablet Take 1 tablet by mouth daily.    . nitroGLYCERIN (NITROSTAT) 0.4 MG SL tablet Place 1 tablet (0.4 mg total) under the tongue every 5 (five) minutes x 3 doses as needed for chest pain. 25 tablet 12  . ondansetron (ZOFRAN) 8 MG tablet Take 1 tablet (8 mg total) by mouth 2 (two) times daily as needed for refractory nausea / vomiting. Start on day 3 after cyclophosphamide chemotherapy. (Patient not taking: Reported on 08/24/2018) 30 tablet 1  . Polyethyl Glycol-Propyl Glycol (SYSTANE OP) Place 1 drop into both eyes at bedtime.     . polyethylene glycol (MIRALAX) packet Take 17 g by mouth daily. (Patient not taking: Reported on 12/26/2018) 30 each 1  . predniSONE (DELTASONE) 20 MG tablet Take 3 tablets (60 mg total) by mouth daily. Take on days 1-5 of chemotherapy. (Patient not taking: Reported on 08/24/2018) 15 tablet 5  . prochlorperazine (COMPAZINE) 10 MG tablet Take 1 tablet (10 mg total) by mouth every 6 (six) hours as needed (Nausea or vomiting). (Patient not taking: Reported on 08/24/2018) 30 tablet 6  . senna-docusate (SENNA S) 8.6-50 MG tablet Take 2 tablets by mouth at bedtime as needed for mild constipation or moderate constipation. (Patient not taking: Reported on 12/26/2018) 60 tablet 1  . white petrolatum (VASELINE) GEL Apply 1 application topically as needed for dry skin (itching).     No current facility-administered medications for this  visit.    Facility-Administered Medications Ordered in Other Visits  Medication Dose Route Frequency Provider Last Rate Last Dose  . influenza vaccine adjuvanted (FLUAD) injection 0.5 mL  0.5 mL Intramuscular Once Irene Limbo Cloria Spring, MD        REVIEW OF SYSTEMS:   A 10+ POINT REVIEW OF SYSTEMS WAS OBTAINED including neurology, dermatology, psychiatry, cardiac, respiratory, lymph, extremities, GI, GU, Musculoskeletal, constitutional, breasts, reproductive, HEENT.  All pertinent positives are noted in the HPI.  All others are negative.   PHYSICAL EXAMINATION: ECOG PERFORMANCE STATUS: 2 - Symptomatic, <50% confined to bed  Vitals:   01/10/19 1039  BP: 102/64  Pulse: (!) 57  Resp: 18  Temp: 98 F (36.7 C)  SpO2: 99%   Filed Weights   01/10/19 1039  Weight: 245 lb 9.6 oz (111.4 kg)   .Body mass index is 32.4 kg/m.   GENERAL:alert, in no acute distress and comfortable SKIN: no acute rashes, no significant lesions EYES: conjunctiva are pink and non-injected, sclera anicteric OROPHARYNX: MMM, no exudates, no oropharyngeal erythema or ulceration NECK: supple, no JVD LYMPH:  no palpable lymphadenopathy in the cervical, axillary or inguinal regions LUNGS: clear to auscultation b/l with normal respiratory effort HEART: regular rate & rhythm ABDOMEN:  normoactive bowel sounds , non tender, not distended. No palpable hepatosplenomegaly.  Extremity: no pedal edema PSYCH: alert & oriented x 3 with fluent speech NEURO: no focal motor/sensory deficits  LABORATORY DATA:  I have reviewed the data as listed  . CBC Latest Ref Rng & Units 01/10/2019 12/26/2018 10/10/2018  WBC 4.0 - 10.5 K/uL 4.5 6.7 4.6  Hemoglobin 13.0 - 17.0 g/dL 13.8 13.2 12.8(L)  Hematocrit 39.0 - 52.0 % 41.7 41.8 39.4  Platelets 150 - 400 K/uL 191 PLATELET CLUMPS NOTED ON SMEAR, UNABLE TO ESTIMATE 191    . CMP Latest Ref Rng & Units 01/10/2019 12/26/2018 10/10/2018  Glucose 70 - 99 mg/dL 152(H) 88 149(H)  BUN 8 -  23 mg/dL 14 14 17   Creatinine 0.61 - 1.24 mg/dL 1.07 0.93 1.21  Sodium 135 - 145 mmol/L 140 140 138  Potassium 3.5 - 5.1 mmol/L 3.9 3.7 4.1  Chloride 98 - 111 mmol/L 106 106 103  CO2 22 - 32 mmol/L 26 24 27   Calcium 8.9 - 10.3 mg/dL 8.9 8.8(L) 8.9  Total Protein 6.5 - 8.1 g/dL 6.6 - 6.6  Total Bilirubin 0.3 - 1.2 mg/dL 1.3(H) - 1.1  Alkaline Phos 38 - 126 U/L 56 - 55  AST 15 - 41 U/L 18 - 29  ALT 0 - 44 U/L 18 - 25   01/05/18 Pathology:    RADIOGRAPHIC STUDIES: I have personally reviewed the radiological images as listed and agreed with the findings in the report. Dg Chest 2 View  Result Date: 12/26/2018 CLINICAL DATA:  Intermittent chest pain and shortness of breath over the last 3 days. Recent treatment for lymphoma. EXAM: CHEST - 2 VIEW COMPARISON:  04/25/2018 FINDINGS: Heart size is normal. Mediastinal shadows are normal. Power port inserted from a right internal jugular approach has its tip in the SVC 2 cm above the right atrium. The left chest is clear. There is a small amount pleural fluid on the right with minimal right base atelectasis. Minimal chronic spinal curvature without acute bone finding. IMPRESSION: Small amount of pleural fluid newly seen on the right with mild right base atelectasis. Electronically Signed   By: Nelson Chimes M.D.   On: 12/26/2018 13:59   Ct Angio Chest Pe W And/or Wo Contrast  Result Date: 12/26/2018 CLINICAL DATA:  Chest  pain, difficulty breathing EXAM: CT ANGIOGRAPHY CHEST WITH CONTRAST TECHNIQUE: Multidetector CT imaging of the chest was performed using the standard protocol during bolus administration of intravenous contrast. Multiplanar CT image reconstructions and MIPs were obtained to evaluate the vascular anatomy. CONTRAST:  163mL OMNIPAQUE IOHEXOL 350 MG/ML SOLN COMPARISON:  Chest x-ray today. FINDINGS: Cardiovascular: No filling defects in the pulmonary arteries to suggest pulmonary emboli. Coronary artery atherosclerosis. Scattered aortic  calcifications as well. Heart is normal size. Aorta is normal caliber. Mediastinum/Nodes: No mediastinal, hilar, or axillary adenopathy. Lungs/Pleura: Trace right pleural effusion. Right basilar opacity. Airspace opacity medially in the anterior right upper lobe. No confluent opacity on the left. Upper Abdomen: Imaging into the upper abdomen shows no acute findings. Musculoskeletal: Chest wall soft tissues are unremarkable. No acute bony abnormality. Review of the MIP images confirms the above findings. IMPRESSION: No evidence of pulmonary embolus. Trace right pleural effusion. Right basilar and medial anterior right upper lobe airspace opacities could reflect atelectasis or infiltrate/pneumonia. Coronary artery disease. Aortic Atherosclerosis (ICD10-I70.0). Electronically Signed   By: Rolm Baptise M.D.   On: 12/26/2018 23:37    ASSESSMENT & PLAN:   78 y.o. male with  1. Primary Testicular Diffuse Large B-Cell Lymphoma, Stage 1E 11/30/16 NM Myocar Multi w/spect w/wall motion which revealed a LV EF of 44%  12/08/17 US Scrotum revealed Abnormal appearance of the left testicle which is larger than the right and diffusely heterogeneous in echotexture. Cannot exclude infiltrating process/mass. Appearance is concerning for possible neoplasm.  01/05/18 Left testes biopsy revealed Primary Testicular Diffuse Large B-Cell Lymphoma   01/25/18 Hep B, Hep C and HIV negative  01/31/18 ECHO revealed a LV EF of 51%. Left ventricle: The cavity size was normal. Wall motion was normal; there were no regional wall motion abnormalities. Atrial septum: No defect or patent foramen ovale was identified.  02/07/18 PET/CT revealed No findings for metabolically active lymphoma involving the neck, chest, abdomen/pelvis or osseous structures.  Due to admission after first intrathecal methotrexate, concern for brain toxicity given patient's age, and concern for treatment tolerance, decided to hold IT MTX. No further concerns  for bleeding, nausea, or vomiting.  08/23/2018 PET scan revealing Mildly hypermetabolic mildly enlarged AP window lymph node, stable since 02/07/2018 PET-CT. Low level metabolism associated with new mild patchy consolidation in the medial segment right middle lobe, potentially Inflammatory. New mildly hypermetabolic right hilar lymph nodes, nonspecific, potentially reactive to the right middle lobe process. Nonspecific patchy hypermetabolism within the right testis is increased. No discrete right testicular mass on the noncontrast CT Images. These findings are equivocal for active lymphoma. Suggest attention on follow-up PET-CT in 3-6 months. Aortic Atherosclerosis (ICD10-I70.0).  2.  Past Medical History:  Diagnosis Date  . Arthritis   . Coronary artery disease   . HOH (hard of hearing)   . Hyperlipidemia   . Hypertension   . Ischemic cardiomyopathy   . Myocardial infarction ([place]) 2016  . Quadriceps tendon rupture    LEFT    PLAN: -Discussed pt labwork today, 01/10/19; all values are WNL except for Glucose at 152, Total Bilirubin at 1.3. -Discussed 01/10/2019 LDH is WNL at 162 -Recommended pt to keep his groin area dry by wearing loose fitting clothes and giving the area air when possible -No clinical or laboratory evidence of the return of pt's Primary Testicular Diffuse Large B-Cell Lymphoma at this time -No indication to treat at this time  -Will continue to monitor at this time with labs and a potential CT  scan in 6 months  -Recommended that the pt continue to eat well, drink at least 48-64 oz of water each day, and walk 20-30 minutes each day.  -Recommended pt to f/u with PCP to discuss Tetanus booster -Confirm Prevnar and Pneumovax vaccine with PCP -Pt advised to contact if any issues arise  -Will see back in 3 months    FOLLOW UP: RTC with Dr. Irene Limbo in 3 months with labs   The total time spent in the appt was 20 minutes and more than 50% was on counseling and direct patient  cares.  All of the patient's questions were answered with apparent satisfaction. The patient knows to call the clinic with any problems, questions or concerns.   Sullivan Lone MD [place] AAHIVMS [facility] Hematology/Oncology Physician [facility]  (Office):       [phone] (Work cell):  [phone] (Fax):           [phone]  01/10/2019 5:00 PM  I, Yevette Edwards, am acting as a scribe for Dr. Sullivan Lone.   .I have reviewed the above documentation for accuracy and completeness, and I agree with the above. Brunetta Genera MD

## 2019-01-10 ENCOUNTER — Other Ambulatory Visit: Payer: Self-pay

## 2019-01-10 ENCOUNTER — Inpatient Hospital Stay: Payer: Medicare HMO | Attending: Hematology | Admitting: Hematology

## 2019-01-10 ENCOUNTER — Inpatient Hospital Stay: Payer: Medicare HMO

## 2019-01-10 VITALS — BP 102/64 | HR 57 | Temp 98.0°F | Resp 18 | Ht 73.0 in | Wt 245.6 lb

## 2019-01-10 DIAGNOSIS — J9811 Atelectasis: Secondary | ICD-10-CM | POA: Diagnosis not present

## 2019-01-10 DIAGNOSIS — I700 Atherosclerosis of aorta: Secondary | ICD-10-CM | POA: Diagnosis not present

## 2019-01-10 DIAGNOSIS — Z79899 Other long term (current) drug therapy: Secondary | ICD-10-CM | POA: Insufficient documentation

## 2019-01-10 DIAGNOSIS — R0602 Shortness of breath: Secondary | ICD-10-CM | POA: Diagnosis not present

## 2019-01-10 DIAGNOSIS — C8339 Diffuse large B-cell lymphoma, extranodal and solid organ sites: Secondary | ICD-10-CM

## 2019-01-10 DIAGNOSIS — C8335 Diffuse large B-cell lymphoma, lymph nodes of inguinal region and lower limb: Secondary | ICD-10-CM | POA: Diagnosis not present

## 2019-01-10 DIAGNOSIS — R490 Dysphonia: Secondary | ICD-10-CM | POA: Diagnosis not present

## 2019-01-10 DIAGNOSIS — R079 Chest pain, unspecified: Secondary | ICD-10-CM | POA: Diagnosis not present

## 2019-01-10 DIAGNOSIS — I2510 Atherosclerotic heart disease of native coronary artery without angina pectoris: Secondary | ICD-10-CM | POA: Insufficient documentation

## 2019-01-10 DIAGNOSIS — Z6832 Body mass index (BMI) 32.0-32.9, adult: Secondary | ICD-10-CM | POA: Insufficient documentation

## 2019-01-10 LAB — CMP (CANCER CENTER ONLY)
ALT: 18 U/L (ref 0–44)
AST: 18 U/L (ref 15–41)
Albumin: 3.5 g/dL (ref 3.5–5.0)
Alkaline Phosphatase: 56 U/L (ref 38–126)
Anion gap: 8 (ref 5–15)
BUN: 14 mg/dL (ref 8–23)
CO2: 26 mmol/L (ref 22–32)
Calcium: 8.9 mg/dL (ref 8.9–10.3)
Chloride: 106 mmol/L (ref 98–111)
Creatinine: 1.07 mg/dL (ref 0.61–1.24)
GFR, Est AFR Am: >60 mL/min (ref >60)
GFR, Estimated: >60 mL/min (ref >60)
Glucose, Bld: 152 mg/dL — ABNORMAL HIGH (ref 70–99)
Potassium: 3.9 mmol/L (ref 3.5–5.1)
Sodium: 140 mmol/L (ref 135–145)
Total Bilirubin: 1.3 mg/dL — ABNORMAL HIGH (ref 0.3–1.2)
Total Protein: 6.6 g/dL (ref 6.5–8.1)

## 2019-01-10 LAB — CBC WITH DIFFERENTIAL/PLATELET
Abs Immature Granulocytes: 0.02 10*3/uL (ref 0.00–0.07)
Basophils Absolute: 0 10*3/uL (ref 0.0–0.1)
Basophils Relative: 1 %
Eosinophils Absolute: 0.3 10*3/uL (ref 0.0–0.5)
Eosinophils Relative: 6 %
HCT: 41.7 % (ref 39.0–52.0)
Hemoglobin: 13.8 g/dL (ref 13.0–17.0)
Immature Granulocytes: 0 %
Lymphocytes Relative: 29 %
Lymphs Abs: 1.3 10*3/uL (ref 0.7–4.0)
MCH: 32.2 pg (ref 26.0–34.0)
MCHC: 33.1 g/dL (ref 30.0–36.0)
MCV: 97.4 fL (ref 80.0–100.0)
Monocytes Absolute: 0.5 10*3/uL (ref 0.1–1.0)
Monocytes Relative: 11 %
Neutro Abs: 2.4 10*3/uL (ref 1.7–7.7)
Neutrophils Relative %: 53 %
Platelets: 191 10*3/uL (ref 150–400)
RBC: 4.28 MIL/uL (ref 4.22–5.81)
RDW: 12.2 % (ref 11.5–15.5)
WBC: 4.5 10*3/uL (ref 4.0–10.5)
nRBC: 0 % (ref 0.0–0.2)

## 2019-01-10 LAB — LACTATE DEHYDROGENASE: LDH: 162 U/L (ref 98–192)

## 2019-01-10 NOTE — Progress Notes (Signed)
Patient reports one fall in last 3 months - was a month ago. Reports falling while trying to swat a bug on the ceiling. Lost balance and fell, scraping upper inner right arm in 3 places on 'picture nail'. Has 3 areas on upper inner right arm, healing, still slightly pink. Described them as bleeding heavily at the time. Reported to Dr. Irene Limbo.

## 2019-01-11 ENCOUNTER — Telehealth: Payer: Self-pay | Admitting: Hematology

## 2019-01-11 NOTE — Telephone Encounter (Signed)
Scheduled appt per 12/1 los.  Spoke with pt daughter and she is aware of the appt date and time.

## 2019-02-16 DIAGNOSIS — R7303 Prediabetes: Secondary | ICD-10-CM | POA: Diagnosis not present

## 2019-02-16 DIAGNOSIS — E785 Hyperlipidemia, unspecified: Secondary | ICD-10-CM | POA: Diagnosis not present

## 2019-02-16 DIAGNOSIS — M1990 Unspecified osteoarthritis, unspecified site: Secondary | ICD-10-CM | POA: Diagnosis not present

## 2019-02-16 DIAGNOSIS — I255 Ischemic cardiomyopathy: Secondary | ICD-10-CM | POA: Diagnosis not present

## 2019-02-16 DIAGNOSIS — I2510 Atherosclerotic heart disease of native coronary artery without angina pectoris: Secondary | ICD-10-CM | POA: Diagnosis not present

## 2019-02-16 DIAGNOSIS — I10 Essential (primary) hypertension: Secondary | ICD-10-CM | POA: Diagnosis not present

## 2019-02-22 DIAGNOSIS — I2510 Atherosclerotic heart disease of native coronary artery without angina pectoris: Secondary | ICD-10-CM | POA: Diagnosis not present

## 2019-02-22 DIAGNOSIS — I10 Essential (primary) hypertension: Secondary | ICD-10-CM | POA: Diagnosis not present

## 2019-02-22 DIAGNOSIS — E785 Hyperlipidemia, unspecified: Secondary | ICD-10-CM | POA: Diagnosis not present

## 2019-03-27 ENCOUNTER — Other Ambulatory Visit: Payer: Self-pay | Admitting: Radiation Oncology

## 2019-03-27 DIAGNOSIS — C6290 Malignant neoplasm of unspecified testis, unspecified whether descended or undescended: Secondary | ICD-10-CM

## 2019-03-27 DIAGNOSIS — C8339 Diffuse large B-cell lymphoma, extranodal and solid organ sites: Secondary | ICD-10-CM

## 2019-03-27 NOTE — Progress Notes (Signed)
Patient has history of testicular radiotherapy for testicular lymphoma.  I am making a referral to endocrinology to evaluate and treat as needed for hypogonadism, should it develop.  -----------------------------------  Eppie Gibson, MD

## 2019-03-29 ENCOUNTER — Telehealth: Payer: Self-pay

## 2019-03-29 NOTE — Telephone Encounter (Signed)
I called Carl [name] at the request of Dr. Isidore Moos. I informed him that she had referred him to endocrinology to have lab work performed to determine if he needs hormonal supplementation after receiving radiation to his testicles. He voiced his understanding and did inform me that he had already been called for an appointment with endocrinology which is scheduled for 04/11/19. He knows to call me if he has a further questions or concerns.

## 2019-04-10 ENCOUNTER — Inpatient Hospital Stay (HOSPITAL_BASED_OUTPATIENT_CLINIC_OR_DEPARTMENT_OTHER): Payer: Medicare HMO | Admitting: Hematology

## 2019-04-10 ENCOUNTER — Encounter (INDEPENDENT_AMBULATORY_CARE_PROVIDER_SITE_OTHER): Payer: Self-pay

## 2019-04-10 ENCOUNTER — Inpatient Hospital Stay: Payer: Medicare HMO | Attending: Hematology

## 2019-04-10 ENCOUNTER — Other Ambulatory Visit: Payer: Self-pay

## 2019-04-10 VITALS — BP 122/76 | HR 62 | Temp 98.0°F | Resp 17 | Ht 73.0 in | Wt 251.4 lb

## 2019-04-10 DIAGNOSIS — I2510 Atherosclerotic heart disease of native coronary artery without angina pectoris: Secondary | ICD-10-CM | POA: Insufficient documentation

## 2019-04-10 DIAGNOSIS — C8339 Diffuse large B-cell lymphoma, extranodal and solid organ sites: Secondary | ICD-10-CM | POA: Diagnosis not present

## 2019-04-10 DIAGNOSIS — C8335 Diffuse large B-cell lymphoma, lymph nodes of inguinal region and lower limb: Secondary | ICD-10-CM | POA: Diagnosis present

## 2019-04-10 DIAGNOSIS — Z79899 Other long term (current) drug therapy: Secondary | ICD-10-CM | POA: Diagnosis not present

## 2019-04-10 DIAGNOSIS — I700 Atherosclerosis of aorta: Secondary | ICD-10-CM | POA: Diagnosis not present

## 2019-04-10 DIAGNOSIS — C83398 Diffuse large b-cell lymphoma of other extranodal and solid organ sites: Secondary | ICD-10-CM

## 2019-04-10 LAB — CBC WITH DIFFERENTIAL/PLATELET
Abs Immature Granulocytes: 0.03 10*3/uL (ref 0.00–0.07)
Basophils Absolute: 0 10*3/uL (ref 0.0–0.1)
Basophils Relative: 1 %
Eosinophils Absolute: 0.2 10*3/uL (ref 0.0–0.5)
Eosinophils Relative: 4 %
HCT: 43.1 % (ref 39.0–52.0)
Hemoglobin: 14.1 g/dL (ref 13.0–17.0)
Immature Granulocytes: 1 %
Lymphocytes Relative: 23 %
Lymphs Abs: 1.2 10*3/uL (ref 0.7–4.0)
MCH: 31.8 pg (ref 26.0–34.0)
MCHC: 32.7 g/dL (ref 30.0–36.0)
MCV: 97.1 fL (ref 80.0–100.0)
Monocytes Absolute: 0.8 10*3/uL (ref 0.1–1.0)
Monocytes Relative: 15 %
Neutro Abs: 3 10*3/uL (ref 1.7–7.7)
Neutrophils Relative %: 56 %
Platelets: 196 10*3/uL (ref 150–400)
RBC: 4.44 MIL/uL (ref 4.22–5.81)
RDW: 12.1 % (ref 11.5–15.5)
WBC: 5.2 10*3/uL (ref 4.0–10.5)
nRBC: 0 % (ref 0.0–0.2)

## 2019-04-10 LAB — CMP (CANCER CENTER ONLY)
ALT: 28 U/L (ref 0–44)
AST: 26 U/L (ref 15–41)
Albumin: 3.8 g/dL (ref 3.5–5.0)
Alkaline Phosphatase: 58 U/L (ref 38–126)
Anion gap: 6 (ref 5–15)
BUN: 20 mg/dL (ref 8–23)
CO2: 29 mmol/L (ref 22–32)
Calcium: 9 mg/dL (ref 8.9–10.3)
Chloride: 107 mmol/L (ref 98–111)
Creatinine: 1.1 mg/dL (ref 0.61–1.24)
GFR, Est AFR Am: >60 mL/min (ref >60)
GFR, Estimated: >60 mL/min (ref >60)
Glucose, Bld: 100 mg/dL — ABNORMAL HIGH (ref 70–99)
Potassium: 4.5 mmol/L (ref 3.5–5.1)
Sodium: 142 mmol/L (ref 135–145)
Total Bilirubin: 1.4 mg/dL — ABNORMAL HIGH (ref 0.3–1.2)
Total Protein: 7 g/dL (ref 6.5–8.1)

## 2019-04-10 NOTE — Progress Notes (Signed)
HEMATOLOGY/ONCOLOGY CLINIC NOTE  Date of Service: 04/10/2019  Patient Care Team: [name], MD as PCP - General (Family Medicine)  CHIEF COMPLAINTS/PURPOSE OF CONSULTATION:  Primary testicular diffuse Large B-Cell Lymphoma   HISTORY OF PRESENTING ILLNESS:   Carl [name] is a wonderful 79 y.o. male who has been referred to [place] by Dr. [name] for evaluation and management of Diffuse Large B-Cell Lymphoma. The pt reports that he is doing well overall.  The pt notes that he first began feeling differently about 4-5 months ago, when he noticed left testicular swelling. He notes that the his testicle was "a little bit larger than a golf ball," and that this was not painful. The pt notes that he has not had any abdominal distension or abdominal pain. The pt denies any fevers, chills, night sweats, or unexpected weight loss. He also denies any leg swelling. The pt denies any trauma sustained by the testicles. He denies noticing any other related symptoms.   The pt reports that he believed he had food poisoning prior to this, as he had lots of diarrhea. He notes that he has observed a small knot on the back of his right wrist, which has not changed over the last year.  The pt has two stents in his heart which were placed in 2016. The pt notes that his blood pressure has been stable and well-controlled. He notes that his last ECHO was 6 months ago, and he has not had any CP or SOB since that time. He takes 81mg  aspirin daily. He also takes Atorvastatin. The pt denies feeling limited in his walking by SOB or CP. He notes that he could walk 2 miles before ceasing to walk, noting that feeling tired in his muscles would limit him.   The pt has recently pursued surgery on his throat with ENT Dr. Melida Quitter for altered voice and hoarseness related to his vocal cords.   The pt notes that he currently lives by himself, and is not married. He has a daughter who is 31 years old. He  notes that his daughter "tries to help out." He notes that he does not have a power of attorney, but that this would be his daughter.   Of note prior to the patient's visit today, pt has had a Left Testis Surgical biopsy completed on 01/05/18 with results revealing Diffuse Large B-Cell Lymphoma.   The pt also had an US Scrotum on 12/08/17 which revealed Abnormal appearance of the left testicle which is larger than the right and diffusely heterogeneous in echotexture. Cannot exclude infiltrating process/mass. Appearance is concerning for possible neoplasm.  Most recent lab results (11/26/17) of BMP revealed all values WNL except for Glucose at 104, Creatinine at 1.30, Calcium at 8.7, GFR at 59 11/26/17 Hemoglobin value at 13.6.   On review of systems, pt reports stable energy levels, left testicular swelling, and denies fevers, chills, night sweats, unexpected weight loss, CP, SOB, back pains, flank pains, abdominal pains, leg swelling, testicular pain, and any other symptoms.   On PMHx the pt reports CAD, two heart stents placed in 2016. On Social Hx the pt denies smoking.  On Family Hx the pt reports mother with lymphoma of the breast. Two sisters with breast cancer, one at age 21.   INTERVAL HISTORY:   [name] returns today for management and evaluation of his Primary testicular Diffuse Large B-Cell Lymphoma. The patient's last visit with [place] was on 01/10/2019. The pt  reports that he is doing well overall.  The pt reports that he has been well in the interim and denies any new concerns. Pt has had both doses of the COVID19 vaccine. He walks at least 3 days per week and averages about 3 miles each time.   He has previously diagnosed with neuropathy and was given a prescription to treat. Pt declined the medicine at the time due to concerns that he was on too much medication. This occurred during the time he was receiving chemotherapy. He has spoken to his PCP and a Podiatrist about  his neuropathy in his feet. His neuropathy is more prominent at night and the feeling often switches between hot and cold and is concentrated in his big toes. Massage has seemed to help these symptoms.   Lab results today (04/10/19) of CBC w/diff and CMP is as follows: all values are WNL except for Glucose at 100, Total Bilirubin at 1.4.  On review of systems, pt reports tingling/numbness in toes and denies fevers, chills, night sweats, testicular pain/swelling, leg swelling, SOB, chest pain, unexpected weight loss and any other symptoms.   MEDICAL HISTORY:  Past Medical History:  Diagnosis Date  . Arthritis   . Coronary artery disease   . HOH (hard of hearing)   . Hyperlipidemia   . Hypertension   . Ischemic cardiomyopathy   . Myocardial infarction ([place]) 2016  . Quadriceps tendon rupture    LEFT    SURGICAL HISTORY: Past Surgical History:  Procedure Laterality Date  . BIOPSY  04/27/2018   Procedure: BIOPSY;  Surgeon: Ronnette Juniper, MD;  Location: [facility];  Service: Gastroenterology;;  . CARDIAC CATHETERIZATION N/A 10/15/2014   Procedure: Left Heart Cath and Coronary Angiography;  Surgeon: Charolette Forward, MD;  Location: [facility];  Service: Cardiovascular;  Laterality: N/A;  . COLONOSCOPY    . ESOPHAGOGASTRODUODENOSCOPY (EGD) WITH PROPOFOL N/A 04/27/2018   Procedure: ESOPHAGOGASTRODUODENOSCOPY (EGD) WITH PROPOFOL;  Surgeon: Ronnette Juniper, MD;  Location: [facility];  Service: Gastroenterology;  Laterality: N/A;  . IR IMAGING GUIDED PORT INSERTION  03/14/2018  . MICROLARYNGOSCOPY WITH CO2 LASER AND EXCISION OF VOCAL CORD LESION N/A 11/26/2017   Procedure: MICROLARYNGOSCOPY WITH CO2 LASER AND EXCISION OF VOCAL CORD LESION WITH JET VENTILATION;  Surgeon: Melida Quitter, MD;  Location: [facility];  Service: ENT;  Laterality: N/A;  . QUADRICEPS TENDON REPAIR Left 02/20/2014   Procedure: LEFT REPAIR QUADRICEP TENDON;  Surgeon: Mauri Pole, MD;  Location: [facility];  Service: Orthopedics;   Laterality: Left;  . THROAT SURGERY  2010    SOCIAL HISTORY: Social History   Socioeconomic History  . Marital status: Divorced    Spouse name: Not on file  . Number of children: Not on file  . Years of education: Not on file  . Highest education level: Not on file  Occupational History  . Not on file  Tobacco Use  . Smoking status: Never Smoker  . Smokeless tobacco: Never Used  Substance and Sexual Activity  . Alcohol use: Not Currently  . Drug use: No  . Sexual activity: Not on file  Other Topics Concern  . Not on file  Social History Narrative  . Not on file   Social Determinants of Health   Financial Resource Strain:   . Difficulty of Paying Living Expenses: Not on file  Food Insecurity:   . Worried About Charity fundraiser in the Last Year: Not on file  . Ran Out of Food in the  Last Year: Not on file  Transportation Needs: No Transportation Needs  . Lack of Transportation (Medical): No  . Lack of Transportation (Non-Medical): No  Physical Activity:   . Days of Exercise per Week: Not on file  . Minutes of Exercise per Session: Not on file  Stress:   . Feeling of Stress : Not on file  Social Connections:   . Frequency of Communication with Friends and Family: Not on file  . Frequency of Social Gatherings with Friends and Family: Not on file  . Attends Religious Services: Not on file  . Active Member of Clubs or Organizations: Not on file  . Attends Archivist Meetings: Not on file  . Marital Status: Not on file  Intimate Partner Violence: Not At Risk  . Fear of Current or Ex-Partner: No  . Emotionally Abused: No  . Physically Abused: No  . Sexually Abused: No    FAMILY HISTORY: No family history on file.  ALLERGIES:  has No Known Allergies.  MEDICATIONS:  Current Outpatient Medications  Medication Sig Dispense Refill  . aspirin EC 81 MG tablet Take 81 mg by mouth daily.    Marland Kitchen atorvastatin (LIPITOR) 40 MG tablet Take 1 tablet (40 mg total)  by mouth daily at 6 PM. 60 tablet 3  . azithromycin (ZITHROMAX) 250 MG tablet Take 1 tablet (250 mg total) by mouth daily. Take first 2 tablets together, then 1 every day until finished. 6 tablet 0  . Calcium Carbonate-Vit D-Min (CALCIUM 1200 PO) Take 1 tablet by mouth daily after breakfast.    . carvedilol (COREG) 6.25 MG tablet Take 3.125 mg by mouth 2 (two) times daily with a meal.     . cholecalciferol (VITAMIN D) 1000 UNITS tablet Take 5,000 Units by mouth daily.     Marland Kitchen losartan (COZAAR) 25 MG tablet Take 25 mg by mouth daily after breakfast.     . Multiple Vitamin (MULTIVITAMIN WITH MINERALS) TABS tablet Take 1 tablet by mouth daily.    . nitroGLYCERIN (NITROSTAT) 0.4 MG SL tablet Place 1 tablet (0.4 mg total) under the tongue every 5 (five) minutes x 3 doses as needed for chest pain. 25 tablet 12  . ondansetron (ZOFRAN) 8 MG tablet Take 1 tablet (8 mg total) by mouth 2 (two) times daily as needed for refractory nausea / vomiting. Start on day 3 after cyclophosphamide chemotherapy. (Patient not taking: Reported on 08/24/2018) 30 tablet 1  . Polyethyl Glycol-Propyl Glycol (SYSTANE OP) Place 1 drop into both eyes at bedtime.     . polyethylene glycol (MIRALAX) packet Take 17 g by mouth daily. (Patient not taking: Reported on 12/26/2018) 30 each 1  . predniSONE (DELTASONE) 20 MG tablet Take 3 tablets (60 mg total) by mouth daily. Take on days 1-5 of chemotherapy. (Patient not taking: Reported on 08/24/2018) 15 tablet 5  . prochlorperazine (COMPAZINE) 10 MG tablet Take 1 tablet (10 mg total) by mouth every 6 (six) hours as needed (Nausea or vomiting). (Patient not taking: Reported on 08/24/2018) 30 tablet 6  . senna-docusate (SENNA S) 8.6-50 MG tablet Take 2 tablets by mouth at bedtime as needed for mild constipation or moderate constipation. (Patient not taking: Reported on 12/26/2018) 60 tablet 1  . white petrolatum (VASELINE) GEL Apply 1 application topically as needed for dry skin (itching).     No  current facility-administered medications for this visit.   Facility-Administered Medications Ordered in Other Visits  Medication Dose Route Frequency Provider Last Rate Last Admin  .  influenza vaccine adjuvanted (FLUAD) injection 0.5 mL  0.5 mL Intramuscular Once Irene Limbo Cloria Spring, MD        REVIEW OF SYSTEMS:   A 10+ POINT REVIEW OF SYSTEMS WAS OBTAINED including neurology, dermatology, psychiatry, cardiac, respiratory, lymph, extremities, GI, GU, Musculoskeletal, constitutional, breasts, reproductive, HEENT.  All pertinent positives are noted in the HPI.  All others are negative.   PHYSICAL EXAMINATION: ECOG PERFORMANCE STATUS: 2 - Symptomatic, <50% confined to bed  Vitals:   04/10/19 1506  BP: 122/76  Pulse: 62  Resp: 17  Temp: 98 F (36.7 C)  SpO2: 100%   Filed Weights   04/10/19 1506  Weight: 251 lb 6.4 oz (114 kg)   .Body mass index is 33.17 kg/m.  GENERAL:alert, in no acute distress and comfortable, HOH SKIN: no acute rashes, no significant lesions EYES: conjunctiva are pink and non-injected, sclera anicteric OROPHARYNX: MMM, no exudates, no oropharyngeal erythema or ulceration NECK: supple, no JVD LYMPH:  no palpable lymphadenopathy in the cervical, axillary or inguinal regions LUNGS: clear to auscultation b/l with normal respiratory effort HEART: regular rate & rhythm ABDOMEN:  normoactive bowel sounds , non tender, not distended. No palpable hepatosplenomegaly.  Extremity: no pedal edema PSYCH: alert & oriented x 3 with fluent speech NEURO: no focal motor/sensory deficits  LABORATORY DATA:  I have reviewed the data as listed  . CBC Latest Ref Rng & Units 04/10/2019 01/10/2019 12/26/2018  WBC 4.0 - 10.5 K/uL 5.2 4.5 6.7  Hemoglobin 13.0 - 17.0 g/dL 14.1 13.8 13.2  Hematocrit 39.0 - 52.0 % 43.1 41.7 41.8  Platelets 150 - 400 K/uL 196 191 PLATELET CLUMPS NOTED ON SMEAR, UNABLE TO ESTIMATE    . CMP Latest Ref Rng & Units 04/10/2019 01/10/2019 12/26/2018    Glucose 70 - 99 mg/dL 100(H) 152(H) 88  BUN 8 - 23 mg/dL 20 14 14   Creatinine 0.61 - 1.24 mg/dL 1.10 1.07 0.93  Sodium 135 - 145 mmol/L 142 140 140  Potassium 3.5 - 5.1 mmol/L 4.5 3.9 3.7  Chloride 98 - 111 mmol/L 107 106 106  CO2 22 - 32 mmol/L 29 26 24   Calcium 8.9 - 10.3 mg/dL 9.0 8.9 8.8(L)  Total Protein 6.5 - 8.1 g/dL 7.0 6.6 -  Total Bilirubin 0.3 - 1.2 mg/dL 1.4(H) 1.3(H) -  Alkaline Phos 38 - 126 U/L 58 56 -  AST 15 - 41 U/L 26 18 -  ALT 0 - 44 U/L 28 18 -   01/05/18 Pathology:    RADIOGRAPHIC STUDIES: I have personally reviewed the radiological images as listed and agreed with the findings in the report. No results found.  ASSESSMENT & PLAN:   79 y.o. male with  1. Primary Testicular Diffuse Large B-Cell Lymphoma, Stage 1E 11/30/16 NM Myocar Multi w/spect w/wall motion which revealed a LV EF of 44%  12/08/17 US Scrotum revealed Abnormal appearance of the left testicle which is larger than the right and diffusely heterogeneous in echotexture. Cannot exclude infiltrating process/mass. Appearance is concerning for possible neoplasm.  01/05/18 Left testes biopsy revealed Primary Testicular Diffuse Large B-Cell Lymphoma   01/25/18 Hep B, Hep C and HIV negative  01/31/18 ECHO revealed a LV EF of 51%. Left ventricle: The cavity size was normal. Wall motion was normal; there were no regional wall motion abnormalities. Atrial septum: No defect or patent foramen ovale was identified.  02/07/18 PET/CT revealed No findings for metabolically active lymphoma involving the neck, chest, abdomen/pelvis or osseous structures.  Due to admission after first  intrathecal methotrexate, concern for brain toxicity given patient's age, and concern for treatment tolerance, decided to hold IT MTX. No further concerns for bleeding, nausea, or vomiting.  08/23/2018 PET scan revealing Mildly hypermetabolic mildly enlarged AP window lymph node, stable since 02/07/2018 PET-CT. Low level  metabolism associated with new mild patchy consolidation in the medial segment right middle lobe, potentially Inflammatory. New mildly hypermetabolic right hilar lymph nodes, nonspecific, potentially reactive to the right middle lobe process. Nonspecific patchy hypermetabolism within the right testis is increased. No discrete right testicular mass on the noncontrast CT Images. These findings are equivocal for active lymphoma. Suggest attention on follow-up PET-CT in 3-6 months. Aortic Atherosclerosis (ICD10-I70.0).  2.  Past Medical History:  Diagnosis Date  . Arthritis   . Coronary artery disease   . HOH (hard of hearing)   . Hyperlipidemia   . Hypertension   . Ischemic cardiomyopathy   . Myocardial infarction ([place]) 2016  . Quadriceps tendon rupture    LEFT    PLAN: -Discussed pt labwork today, 04/10/19; blood counts and chemistries are normal  -Pt is now about 9 months from his last treatment -No clinical or laboratory evidence of the return of pt's Primary Testicular Diffuse Large B-Cell Lymphoma at this time -No indication to treat at this time  -Will continue to monitor at this time with labs and clinic visits -Will see back in 3 months with labs    FOLLOW UP: RTC with Dr Irene Limbo with labs in 3 months  The total time spent in the appt was 20 minutes and more than 50% was on counseling and direct patient cares.  All of the patient's questions were answered with apparent satisfaction. The patient knows to call the clinic with any problems, questions or concerns.   Sullivan Lone MD [place] AAHIVMS [facility] Hematology/Oncology Physician [facility]  (Office):       [phone] (Work cell):  [phone] (Fax):           [phone]  04/10/2019 4:14 PM  I, Yevette Edwards, am acting as a scribe for Dr. Sullivan Lone.   .I have reviewed the above documentation for accuracy and completeness, and I agree with the above. Brunetta Genera MD

## 2019-04-10 NOTE — Progress Notes (Signed)
Name: Carl [name]  MRN/ DOB: [id number], 1940/04/06    Age/ Sex: 79 y.o., male    PCP: [name], MD   Reason for Endocrinology Evaluation: Testicular radiotherapy  Referring Provider : Eppie Gibson, MD  Date of Initial Endocrinology Evaluation: 04/11/2019     HPI: Carl [name] is a 79 y.o. male with a past medical history of CAD ( S/P DES 2016) and HTN . The patient presented for initial endocrinology clinic visit on 04/11/2019 for consultative assistance with his testicular radiotherapy.   Pt was diagnosed with testicular lymphoma in 01/2018. He is S/P chemo and scrotal Radiation therapy, he completed radiation therapy in 09/2018.   He was referred here to evaluate , monitor and treat for hypogonadism should it develop following radiation exposure.  He has stable energy level  He is not sexually active  Denies gynecomastia Denies spontaneous erections for many years He denies any reduction in the frequency of shaving.  Pt has nocturia 1-2x at night. Pt has frequency during the day, no straining  Denies depression but has gained weight   Sees Dr. Terrence Dupont (cardiology ) No FH of prostate cancer He is not a smoker    HISTORY:  Past Medical History:  Past Medical History:  Diagnosis Date  . Arthritis   . Coronary artery disease   . HOH (hard of hearing)   . Hyperlipidemia   . Hypertension   . Ischemic cardiomyopathy   . Myocardial infarction ([place]) 2016  . Quadriceps tendon rupture    LEFT   Past Surgical History:  Past Surgical History:  Procedure Laterality Date  . BIOPSY  04/27/2018   Procedure: BIOPSY;  Surgeon: Ronnette Juniper, MD;  Location: [facility];  Service: Gastroenterology;;  . CARDIAC CATHETERIZATION N/A 10/15/2014   Procedure: Left Heart Cath and Coronary Angiography;  Surgeon: Charolette Forward, MD;  Location: [facility];  Service: Cardiovascular;  Laterality: N/A;  . COLONOSCOPY    . ESOPHAGOGASTRODUODENOSCOPY (EGD) WITH  PROPOFOL N/A 04/27/2018   Procedure: ESOPHAGOGASTRODUODENOSCOPY (EGD) WITH PROPOFOL;  Surgeon: Ronnette Juniper, MD;  Location: [facility];  Service: Gastroenterology;  Laterality: N/A;  . IR IMAGING GUIDED PORT INSERTION  03/14/2018  . MICROLARYNGOSCOPY WITH CO2 LASER AND EXCISION OF VOCAL CORD LESION N/A 11/26/2017   Procedure: MICROLARYNGOSCOPY WITH CO2 LASER AND EXCISION OF VOCAL CORD LESION WITH JET VENTILATION;  Surgeon: Melida Quitter, MD;  Location: [facility];  Service: ENT;  Laterality: N/A;  . QUADRICEPS TENDON REPAIR Left 02/20/2014   Procedure: LEFT REPAIR QUADRICEP TENDON;  Surgeon: Mauri Pole, MD;  Location: [facility];  Service: Orthopedics;  Laterality: Left;  . THROAT SURGERY  2010      Social History:  reports that he has never smoked. He has never used smokeless tobacco. He reports previous alcohol use. He reports that he does not use drugs.  Family History: family history is not on file.   HOME MEDICATIONS: Allergies as of 04/11/2019   No Known Allergies     Medication List       Accurate as of April 11, 2019  2:44 PM. If you have any questions, ask your nurse or doctor.        STOP taking these medications   azithromycin 250 MG tablet Commonly known as: ZITHROMAX Stopped by: Dorita Sciara, MD   ondansetron 8 MG tablet Commonly known as: Zofran Stopped by: Dorita Sciara, MD   polyethylene glycol 17 g packet Commonly known as: MiraLax Stopped  by: Dorita Sciara, MD   predniSONE 20 MG tablet Commonly known as: DELTASONE Stopped by: Dorita Sciara, MD   prochlorperazine 10 MG tablet Commonly known as: COMPAZINE Stopped by: Dorita Sciara, MD     TAKE these medications   aspirin EC 81 MG tablet Take 81 mg by mouth daily.   atorvastatin 40 MG tablet Commonly known as: LIPITOR Take 1 tablet (40 mg total) by mouth daily at 6 PM.   CALCIUM 1200 PO Take 1 tablet by mouth daily after breakfast.   carvedilol 6.25 MG  tablet Commonly known as: COREG Take 3.125 mg by mouth 2 (two) times daily with a meal.   cholecalciferol 1000 units tablet Commonly known as: VITAMIN D Take 5,000 Units by mouth daily.   losartan 25 MG tablet Commonly known as: COZAAR Take 25 mg by mouth daily after breakfast.   multivitamin with minerals Tabs tablet Take 1 tablet by mouth daily.   nitroGLYCERIN 0.4 MG SL tablet Commonly known as: NITROSTAT Place 1 tablet (0.4 mg total) under the tongue every 5 (five) minutes x 3 doses as needed for chest pain.   senna-docusate 8.6-50 MG tablet Commonly known as: Senna S Take 2 tablets by mouth at bedtime as needed for mild constipation or moderate constipation.   SYSTANE OP Place 1 drop into both eyes at bedtime.   Vaseline Gel Generic drug: white petrolatum Apply 1 application topically as needed for dry skin (itching).         REVIEW OF SYSTEMS: A comprehensive ROS was conducted with the patient and is negative except as per HPI     OBJECTIVE:  VS: BP 124/78 (BP Location: Left Arm, Patient Position: Sitting, Cuff Size: Normal)   Pulse 67   Temp 98.1 F (36.7 C)   Ht 6\' 1"  (1.854 m)   Wt 251 lb 6.4 oz (114 kg)   SpO2 97%   BMI 33.17 kg/m    Wt Readings from Last 3 Encounters:  04/11/19 251 lb 6.4 oz (114 kg)  04/10/19 251 lb 6.4 oz (114 kg)  01/10/19 245 lb 9.6 oz (111.4 kg)     EXAM: General: Pt appears well and is in NAD  Neck: General: Supple without adenopathy. Thyroid: Thyroid size normal.  No goiter or nodules appreciated. No thyroid bruit.  Lungs: Clear with good BS bilat with no rales, rhonchi, or wheezes  Heart: Auscultation: RRR.  Chest wall:  Minimal glandular tissue on the left.  Fatty tissue on the right.  Abdomen: Normoactive bowel sounds, soft, nontender, without masses or organomegaly palpable  Extremities:  BL LE: No pretibial edema normal ROM and strength.  Skin: Hair: Texture and amount normal with gender appropriate  distribution Skin Inspection: No rashes, acanthosis nigricans/skin tags. No lipohypertrophy Skin Palpation: Skin temperature, texture, and thickness normal to palpation  Neuro: Cranial nerves: II - XII grossly intact  Motor: Normal strength throughout DTRs: 2+ and symmetric in UE without delay in relaxation phase  Mental Status: Judgment, insight: Intact Orientation: Oriented to time, place, and person Mood and affect: No depression, anxiety, or agitation     DATA REVIEWED:   Results for Carl, [name] (MRN [id number]) as of 04/11/2019 14:43  Ref. Range 04/10/2019 13:47  Sodium Latest Ref Range: 135 - 145 mmol/L 142  Potassium Latest Ref Range: 3.5 - 5.1 mmol/L 4.5  Chloride Latest Ref Range: 98 - 111 mmol/L 107  CO2 Latest Ref Range: 22 - 32 mmol/L 29  Glucose Latest Ref Range:  70 - 99 mg/dL 100 (H)  BUN Latest Ref Range: 8 - 23 mg/dL 20  Creatinine Latest Ref Range: 0.61 - 1.24 mg/dL 1.10  Calcium Latest Ref Range: 8.9 - 10.3 mg/dL 9.0  Anion gap Latest Ref Range: 5 - 15  6  Alkaline Phosphatase Latest Ref Range: 38 - 126 U/L 58  Albumin Latest Ref Range: 3.5 - 5.0 g/dL 3.8  AST Latest Ref Range: 15 - 41 U/L 26  ALT Latest Ref Range: 0 - 44 U/L 28  Total Protein Latest Ref Range: 6.5 - 8.1 g/dL 7.0  Total Bilirubin Latest Ref Range: 0.3 - 1.2 mg/dL 1.4 (H)  GFR, Est Non African American Latest Ref Range: >60 mL/min >60  GFR, Est African American Latest Ref Range: >60 mL/min >60  WBC Latest Ref Range: 4.0 - 10.5 K/uL 5.2  RBC Latest Ref Range: 4.22 - 5.81 MIL/uL 4.44  Hemoglobin Latest Ref Range: 13.0 - 17.0 g/dL 14.1  HCT Latest Ref Range: 39.0 - 52.0 % 43.1  MCV Latest Ref Range: 80.0 - 100.0 fL 97.1  MCH Latest Ref Range: 26.0 - 34.0 pg 31.8  MCHC Latest Ref Range: 30.0 - 36.0 g/dL 32.7  RDW Latest Ref Range: 11.5 - 15.5 % 12.1  Platelets Latest Ref Range: 150 - 400 K/uL 196  nRBC Latest Ref Range: 0.0 - 0.2 % 0.0  Neutrophils Latest Units: % 56  Lymphocytes Latest  Units: % 23  Monocytes Relative Latest Units: % 15  Eosinophil Latest Units: % 4  Basophil Latest Units: % 1  Immature Granulocytes Latest Units: % 1  NEUT# Latest Ref Range: 1.7 - 7.7 K/uL 3.0  Lymphocyte # Latest Ref Range: 0.7 - 4.0 K/uL 1.2  Monocyte # Latest Ref Range: 0.1 - 1.0 K/uL 0.8  Eosinophils Absolute Latest Ref Range: 0.0 - 0.5 K/uL 0.2  Basophils Absolute Latest Ref Range: 0.0 - 0.1 K/uL 0.0  Abs Immature Granulocytes Latest Ref Range: 0.00 - 0.07 K/uL 0.03    ASSESSMENT/PLAN/RECOMMENDATIONS:    1. Testicular exposure to radiation:  -Patient is at high risk for primary hyper gonad is him following radiation exposure for the treatment of testicular lymphoma. -No obvious signs of hypogonadism at this point, I do suspect that his testosterone level is going to be somehow lower than average due to his advanced age. -I have discussed with the patient the endocrine Society guidelines in diagnosing hypogonadism -Patient is going to stop by the lab in the morning, fasting at his convenience for testosterone check. -We discussed side effects of testosterone therapy such as erythropoiesis, worsening of sleep apnea that is severe and untreated,  Prostate volumes and serum PSA increase in response to testosterone treatment which might increase BPH and worsen urinary outflow obstruction as well as prostate cancer risk.  - We also discussed there is a possibility of increased cardiovascular risk associated with testosterone use. -If his testosterone level is low, we will have to weigh in the risks versus the benefits of the treatment given his advanced age and comorbidities.  Follow-up in 6 months  Signed electronically by: Mack Guise, MD  [facility]  [facility] [address] [place],  [address] Phone: [phone] FAX: [phone]   CC: [name], [place] [address] Phone:  [phone] Fax: [phone]   Return to Endocrinology clinic as below: Future Appointments  Date Time Provider [place]  05/05/2019  8:00 AM [facility] None  07/14/2019  10:00 AM [facility] 2 [facility] None  07/14/2019 10:40 AM Brunetta Genera, MD [facility] None  10/12/2019  9:30 AM Enes Wegener, [name], MD [facility] None

## 2019-04-11 ENCOUNTER — Ambulatory Visit (INDEPENDENT_AMBULATORY_CARE_PROVIDER_SITE_OTHER): Payer: Medicare HMO | Admitting: Internal Medicine

## 2019-04-11 ENCOUNTER — Encounter: Payer: Self-pay | Admitting: Internal Medicine

## 2019-04-11 VITALS — BP 124/78 | HR 67 | Temp 98.1°F | Ht 73.0 in | Wt 251.4 lb

## 2019-04-11 DIAGNOSIS — T66XXXS Radiation sickness, unspecified, sequela: Secondary | ICD-10-CM | POA: Diagnosis not present

## 2019-04-11 DIAGNOSIS — C8339 Diffuse large B-cell lymphoma, extranodal and solid organ sites: Secondary | ICD-10-CM | POA: Diagnosis not present

## 2019-04-11 NOTE — Patient Instructions (Signed)
-   Please schedule a fasting lab appointment at 8 AM

## 2019-04-13 ENCOUNTER — Telehealth: Payer: Self-pay | Admitting: Hematology

## 2019-04-13 NOTE — Telephone Encounter (Signed)
Scheduled per los, patient has been called and notified. 

## 2019-05-05 ENCOUNTER — Other Ambulatory Visit: Payer: Self-pay

## 2019-05-05 ENCOUNTER — Other Ambulatory Visit (INDEPENDENT_AMBULATORY_CARE_PROVIDER_SITE_OTHER): Payer: Medicare HMO

## 2019-05-05 DIAGNOSIS — T66XXXS Radiation sickness, unspecified, sequela: Secondary | ICD-10-CM | POA: Diagnosis not present

## 2019-05-05 DIAGNOSIS — C8339 Diffuse large B-cell lymphoma, extranodal and solid organ sites: Secondary | ICD-10-CM | POA: Diagnosis not present

## 2019-05-05 LAB — CBC
HCT: 41.4 % (ref 39.0–52.0)
Hemoglobin: 14.1 g/dL (ref 13.0–17.0)
MCHC: 34.1 g/dL (ref 30.0–36.0)
MCV: 96 fl (ref 78.0–100.0)
Platelets: 138 10*3/uL — ABNORMAL LOW (ref 150.0–400.0)
RBC: 4.31 Mil/uL (ref 4.22–5.81)
RDW: 13 % (ref 11.5–15.5)
WBC: 4 10*3/uL (ref 4.0–10.5)

## 2019-05-05 LAB — T4, FREE: Free T4: 0.77 ng/dL (ref 0.60–1.60)

## 2019-05-05 LAB — TSH: TSH: 1 u[IU]/mL (ref 0.35–4.50)

## 2019-05-05 LAB — LUTEINIZING HORMONE: LH: 13.8 m[IU]/mL (ref 3.10–34.60)

## 2019-05-05 LAB — PSA: PSA: 1.42 ng/mL (ref 0.10–4.00)

## 2019-05-05 LAB — FOLLICLE STIMULATING HORMONE: FSH: 32.5 m[IU]/mL — ABNORMAL HIGH (ref 1.4–18.1)

## 2019-05-08 ENCOUNTER — Telehealth: Payer: Self-pay | Admitting: Internal Medicine

## 2019-05-08 LAB — TESTOSTERONE, TOTAL, LC/MS/MS: Testosterone, Total, LC-MS-MS: 414 ng/dL (ref 250–1100)

## 2019-05-08 LAB — TESTOSTERONE TOTAL,FREE,BIO, MALES
Albumin: 3.8 g/dL (ref 3.6–5.1)
Sex Hormone Binding: 96 nmol/L — ABNORMAL HIGH (ref 22–77)
Testosterone, Bioavailable: 42.7 ng/dL (ref 15.0–150.0)
Testosterone, Free: 24.4 pg/mL (ref 6.0–73.0)
Testosterone: 474 ng/dL (ref 250–827)

## 2019-05-08 NOTE — Telephone Encounter (Signed)
Please let him know his testosterone and thyroid function are normal and no need for testosterone therapy at this time.    Thanks    Abby Nena Jordan, MD  [facility]  [facility] [address] [place], [place] [address] Phone: [phone] FAX: [phone]

## 2019-05-09 NOTE — Telephone Encounter (Signed)
Please review message below.

## 2019-05-09 NOTE — Telephone Encounter (Signed)
LMTCB to get lab results.

## 2019-05-09 NOTE — Telephone Encounter (Signed)
Patient notified of lab results and states understanding.

## 2019-05-18 DIAGNOSIS — E785 Hyperlipidemia, unspecified: Secondary | ICD-10-CM | POA: Diagnosis not present

## 2019-05-18 DIAGNOSIS — M1990 Unspecified osteoarthritis, unspecified site: Secondary | ICD-10-CM | POA: Diagnosis not present

## 2019-05-18 DIAGNOSIS — I2510 Atherosclerotic heart disease of native coronary artery without angina pectoris: Secondary | ICD-10-CM | POA: Diagnosis not present

## 2019-05-18 DIAGNOSIS — I10 Essential (primary) hypertension: Secondary | ICD-10-CM | POA: Diagnosis not present

## 2019-05-18 DIAGNOSIS — I255 Ischemic cardiomyopathy: Secondary | ICD-10-CM | POA: Diagnosis not present

## 2019-05-18 DIAGNOSIS — R7303 Prediabetes: Secondary | ICD-10-CM | POA: Diagnosis not present

## 2019-07-11 ENCOUNTER — Telehealth: Payer: Self-pay | Admitting: Hematology

## 2019-07-11 NOTE — Telephone Encounter (Signed)
Rescheduled 06/04 appointment time, called patient regarding schedule change. Patient has been notified.

## 2019-07-13 DIAGNOSIS — H35033 Hypertensive retinopathy, bilateral: Secondary | ICD-10-CM | POA: Diagnosis not present

## 2019-07-13 DIAGNOSIS — H2513 Age-related nuclear cataract, bilateral: Secondary | ICD-10-CM | POA: Diagnosis not present

## 2019-07-13 DIAGNOSIS — H40013 Open angle with borderline findings, low risk, bilateral: Secondary | ICD-10-CM | POA: Diagnosis not present

## 2019-07-13 DIAGNOSIS — H35363 Drusen (degenerative) of macula, bilateral: Secondary | ICD-10-CM | POA: Diagnosis not present

## 2019-07-13 NOTE — Progress Notes (Signed)
HEMATOLOGY/ONCOLOGY CLINIC NOTE  Date of Service: 07/14/2019  Patient Care Team: [name], MD as PCP - General (Family Medicine)  CHIEF COMPLAINTS/PURPOSE OF CONSULTATION:  Primary testicular diffuse Large B-Cell Lymphoma   HISTORY OF PRESENTING ILLNESS:   Carl [name] is a wonderful 79 y.o. male who has been referred to [place] by Dr. [name] for evaluation and management of Diffuse Large B-Cell Lymphoma. The pt reports that he is doing well overall.  The pt notes that he first began feeling differently about 4-5 months ago, when he noticed left testicular swelling. He notes that the his testicle was "a little bit larger than a golf ball," and that this was not painful. The pt notes that he has not had any abdominal distension or abdominal pain. The pt denies any fevers, chills, night sweats, or unexpected weight loss. He also denies any leg swelling. The pt denies any trauma sustained by the testicles. He denies noticing any other related symptoms.   The pt reports that he believed he had food poisoning prior to this, as he had lots of diarrhea. He notes that he has observed a small knot on the back of his right wrist, which has not changed over the last year.  The pt has two stents in his heart which were placed in 2016. The pt notes that his blood pressure has been stable and well-controlled. He notes that his last ECHO was 6 months ago, and he has not had any CP or SOB since that time. He takes 81mg  aspirin daily. He also takes Atorvastatin. The pt denies feeling limited in his walking by SOB or CP. He notes that he could walk 2 miles before ceasing to walk, noting that feeling tired in his muscles would limit him.   The pt has recently pursued surgery on his throat with ENT Dr. Melida Quitter for altered voice and hoarseness related to his vocal cords.   The pt notes that he currently lives by himself, and is not married. He has a daughter who is 21 years old. He  notes that his daughter "tries to help out." He notes that he does not have a power of attorney, but that this would be his daughter.   Of note prior to the patient's visit today, pt has had a Left Testis Surgical biopsy completed on 01/05/18 with results revealing Diffuse Large B-Cell Lymphoma.   The pt also had an US Scrotum on 12/08/17 which revealed Abnormal appearance of the left testicle which is larger than the right and diffusely heterogeneous in echotexture. Cannot exclude infiltrating process/mass. Appearance is concerning for possible neoplasm.  Most recent lab results (11/26/17) of BMP revealed all values WNL except for Glucose at 104, Creatinine at 1.30, Calcium at 8.7, GFR at 59 11/26/17 Hemoglobin value at 13.6.   On review of systems, pt reports stable energy levels, left testicular swelling, and denies fevers, chills, night sweats, unexpected weight loss, CP, SOB, back pains, flank pains, abdominal pains, leg swelling, testicular pain, and any other symptoms.   On PMHx the pt reports CAD, two heart stents placed in 2016. On Social Hx the pt denies smoking.  On Family Hx the pt reports mother with lymphoma of the breast. Two sisters with breast cancer, one at age 69.   INTERVAL HISTORY:   Carl [name] returns today for management and evaluation of his Primary testicular Diffuse Large B-Cell Lymphoma. The patient's last visit with [place] was on 04/10/19. The pt  reports that he is doing well overall.  The pt reports he is good. Sometime the pt is tired, but that is not new. He has been able to do what he wants to do. Pt has been staying active with walking and golfing. He has been taking Calcium and 5000 units of Vitamin D.   Lab results today (07/14/19) of CBC w/diff and CMP is as follows: all values are WNL except for Glucose at 110, Calcium at 8.7, Total Bilirubin at 1.5 07/14/19 of LDH at 155  On review of systems, pt reports staying active, fatigue  and denies fever,  chills, night sweats, unexpected weight loss, new lumps/bumps, skin rashes, testicular pain or swelling, abdominal pain and any other symptoms.   MEDICAL HISTORY:  Past Medical History:  Diagnosis Date  . Arthritis   . Coronary artery disease   . HOH (hard of hearing)   . Hyperlipidemia   . Hypertension   . Ischemic cardiomyopathy   . Myocardial infarction ([place]) 2016  . Quadriceps tendon rupture    LEFT    SURGICAL HISTORY: Past Surgical History:  Procedure Laterality Date  . BIOPSY  04/27/2018   Procedure: BIOPSY;  Surgeon: Ronnette Juniper, MD;  Location: [facility];  Service: Gastroenterology;;  . CARDIAC CATHETERIZATION N/A 10/15/2014   Procedure: Left Heart Cath and Coronary Angiography;  Surgeon: Charolette Forward, MD;  Location: [facility];  Service: Cardiovascular;  Laterality: N/A;  . COLONOSCOPY    . ESOPHAGOGASTRODUODENOSCOPY (EGD) WITH PROPOFOL N/A 04/27/2018   Procedure: ESOPHAGOGASTRODUODENOSCOPY (EGD) WITH PROPOFOL;  Surgeon: Ronnette Juniper, MD;  Location: [facility];  Service: Gastroenterology;  Laterality: N/A;  . IR IMAGING GUIDED PORT INSERTION  03/14/2018  . MICROLARYNGOSCOPY WITH CO2 LASER AND EXCISION OF VOCAL CORD LESION N/A 11/26/2017   Procedure: MICROLARYNGOSCOPY WITH CO2 LASER AND EXCISION OF VOCAL CORD LESION WITH JET VENTILATION;  Surgeon: Melida Quitter, MD;  Location: [facility];  Service: ENT;  Laterality: N/A;  . QUADRICEPS TENDON REPAIR Left 02/20/2014   Procedure: LEFT REPAIR QUADRICEP TENDON;  Surgeon: Mauri Pole, MD;  Location: [facility];  Service: Orthopedics;  Laterality: Left;  . THROAT SURGERY  2010    SOCIAL HISTORY: Social History   Socioeconomic History  . Marital status: Divorced    Spouse name: Not on file  . Number of children: Not on file  . Years of education: Not on file  . Highest education level: Not on file  Occupational History  . Not on file  Tobacco Use  . Smoking status: Never Smoker  . Smokeless tobacco: Never Used   Substance and Sexual Activity  . Alcohol use: Not Currently  . Drug use: No  . Sexual activity: Not on file  Other Topics Concern  . Not on file  Social History Narrative  . Not on file   Social Determinants of Health   Financial Resource Strain:   . Difficulty of Paying Living Expenses:   Food Insecurity:   . Worried About Charity fundraiser in the Last Year:   . Arboriculturist in the Last Year:   Transportation Needs: No Transportation Needs  . Lack of Transportation (Medical): No  . Lack of Transportation (Non-Medical): No  Physical Activity:   . Days of Exercise per Week:   . Minutes of Exercise per Session:   Stress:   . Feeling of Stress :   Social Connections:   . Frequency of Communication with Friends and Family:   . Frequency of  Social Gatherings with Friends and Family:   . Attends Religious Services:   . Active Member of Clubs or Organizations:   . Attends Archivist Meetings:   Marland Kitchen Marital Status:   Intimate Partner Violence: Not At Risk  . Fear of Current or Ex-Partner: No  . Emotionally Abused: No  . Physically Abused: No  . Sexually Abused: No    FAMILY HISTORY: No family history on file.  ALLERGIES:  has No Known Allergies.  MEDICATIONS:  Current Outpatient Medications  Medication Sig Dispense Refill  . aspirin EC 81 MG tablet Take 81 mg by mouth daily.    Marland Kitchen atorvastatin (LIPITOR) 40 MG tablet Take 1 tablet (40 mg total) by mouth daily at 6 PM. 60 tablet 3  . Calcium Carbonate-Vit D-Min (CALCIUM 1200 PO) Take 1 tablet by mouth daily after breakfast.    . carvedilol (COREG) 6.25 MG tablet Take 3.125 mg by mouth 2 (two) times daily with a meal.     . cholecalciferol (VITAMIN D) 1000 UNITS tablet Take 5,000 Units by mouth daily.     Marland Kitchen losartan (COZAAR) 25 MG tablet Take 25 mg by mouth daily after breakfast.     . Multiple Vitamin (MULTIVITAMIN WITH MINERALS) TABS tablet Take 1 tablet by mouth daily.    . nitroGLYCERIN (NITROSTAT) 0.4 MG SL  tablet Place 1 tablet (0.4 mg total) under the tongue every 5 (five) minutes x 3 doses as needed for chest pain. 25 tablet 12  . Polyethyl Glycol-Propyl Glycol (SYSTANE OP) Place 1 drop into both eyes at bedtime.     . senna-docusate (SENNA S) 8.6-50 MG tablet Take 2 tablets by mouth at bedtime as needed for mild constipation or moderate constipation. 60 tablet 1  . white petrolatum (VASELINE) GEL Apply 1 application topically as needed for dry skin (itching).     No current facility-administered medications for this visit.   Facility-Administered Medications Ordered in Other Visits  Medication Dose Route Frequency Provider Last Rate Last Admin  . influenza vaccine adjuvanted (FLUAD) injection 0.5 mL  0.5 mL Intramuscular Once Irene Limbo Cloria Spring, MD        REVIEW OF SYSTEMS:   A 10+ POINT REVIEW OF SYSTEMS WAS OBTAINED including neurology, dermatology, psychiatry, cardiac, respiratory, lymph, extremities, GI, GU, Musculoskeletal, constitutional, breasts, reproductive, HEENT.  All pertinent positives are noted in the HPI.  All others are negative.   PHYSICAL EXAMINATION: ECOG PERFORMANCE STATUS: 2 - Symptomatic, <50% confined to bed  Vitals:   07/14/19 1006  BP: 129/84  Pulse: 60  Resp: 18  Temp: (!) 97.2 F (36.2 C)  SpO2: 99%   Filed Weights   07/14/19 1006  Weight: 253 lb 11.2 oz (115.1 kg)   .Body mass index is 33.47 kg/m.   GENERAL:alert, in no acute distress and comfortable SKIN: no acute rashes, no significant lesions EYES: conjunctiva are pink and non-injected, sclera anicteric OROPHARYNX: MMM, no exudates, no oropharyngeal erythema or ulceration NECK: supple, no JVD LYMPH:  no palpable lymphadenopathy in the cervical, axillary or inguinal regions LUNGS: clear to auscultation b/l with normal respiratory effort HEART: regular rate & rhythm ABDOMEN:  normoactive bowel sounds , non tender, not distended. Extremity: no pedal edema PSYCH: alert & oriented x 3 with  fluent speech NEURO: no focal motor/sensory deficits  LABORATORY DATA:  I have reviewed the data as listed  . CBC Latest Ref Rng & Units 07/14/2019 05/05/2019 04/10/2019  WBC 4.0 - 10.5 K/uL 4.8 4.0 5.2  Hemoglobin 13.0 -  17.0 g/dL 14.0 14.1 14.1  Hematocrit 39.0 - 52.0 % 42.4 41.4 43.1  Platelets 150 - 400 K/uL 162 138.0(L) 196    . CMP Latest Ref Rng & Units 07/14/2019 04/10/2019 01/10/2019  Glucose 70 - 99 mg/dL 110(H) 100(H) 152(H)  BUN 8 - 23 mg/dL 14 20 14   Creatinine 0.61 - 1.24 mg/dL 1.11 1.10 1.07  Sodium 135 - 145 mmol/L 138 142 140  Potassium 3.5 - 5.1 mmol/L 4.0 4.5 3.9  Chloride 98 - 111 mmol/L 107 107 106  CO2 22 - 32 mmol/L 22 29 26   Calcium 8.9 - 10.3 mg/dL 8.7(L) 9.0 8.9  Total Protein 6.5 - 8.1 g/dL 6.8 7.0 6.6  Total Bilirubin 0.3 - 1.2 mg/dL 1.5(H) 1.4(H) 1.3(H)  Alkaline Phos 38 - 126 U/L 56 58 56  AST 15 - 41 U/L 22 26 18   ALT 0 - 44 U/L 24 28 18    01/05/18 Pathology:    RADIOGRAPHIC STUDIES: I have personally reviewed the radiological images as listed and agreed with the findings in the report. No results found.  ASSESSMENT & PLAN:   79 y.o. male with  1. Primary Testicular Diffuse Large B-Cell Lymphoma, Stage 1E 11/30/16 NM Myocar Multi w/spect w/wall motion which revealed a LV EF of 44%  12/08/17 US Scrotum revealed Abnormal appearance of the left testicle which is larger than the right and diffusely heterogeneous in echotexture. Cannot exclude infiltrating process/mass. Appearance is concerning for possible neoplasm.  01/05/18 Left testes biopsy revealed Primary Testicular Diffuse Large B-Cell Lymphoma   01/25/18 Hep B, Hep C and HIV negative  01/31/18 ECHO revealed a LV EF of 51%. Left ventricle: The cavity size was normal. Wall motion was normal; there were no regional wall motion abnormalities. Atrial septum: No defect or patent foramen ovale was identified.  02/07/18 PET/CT revealed No findings for metabolically active lymphoma involving the  neck, chest, abdomen/pelvis or osseous structures.  Due to admission after first intrathecal methotrexate, concern for brain toxicity given patient's age, and concern for treatment tolerance, decided to hold IT MTX. No further concerns for bleeding, nausea, or vomiting.  08/23/2018 PET scan revealing Mildly hypermetabolic mildly enlarged AP window lymph node, stable since 02/07/2018 PET-CT. Low level metabolism associated with new mild patchy consolidation in the medial segment right middle lobe, potentially Inflammatory. New mildly hypermetabolic right hilar lymph nodes, nonspecific, potentially reactive to the right middle lobe process. Nonspecific patchy hypermetabolism within the right testis is increased. No discrete right testicular mass on the noncontrast CT Images. These findings are equivocal for active lymphoma. Suggest attention on follow-up PET-CT in 3-6 months. Aortic Atherosclerosis (ICD10-I70.0).  2.  Past Medical History:  Diagnosis Date  . Arthritis   . Coronary artery disease   . HOH (hard of hearing)   . Hyperlipidemia   . Hypertension   . Ischemic cardiomyopathy   . Myocardial infarction ([place]) 2016  . Quadriceps tendon rupture    LEFT    PLAN: -Discussed pt labwork today, 07/14/19; of CBC w/diff and CMP is as follows: all values are WNL except for Glucose at 110, Calcium at 8.7, Total Bilirubin at 1.5 -Discussed 07/14/19 of LDH at 155 -No clinical or laboratory evidence of the return of pt's Primary Testicular Diffuse Large B-Cell Lymphoma at this time -No indication to treat at this time  -Pt is 1 year out from treatment- last treatment was June 2020 -Recommends continue taking Vitamin D and Calcium  -Will continue to monitor at this time with labs and  clinic visits -Will see back in 3 months with labs   FOLLOW UP: RTC with Dr Irene Limbo with labs in 3 months  The total time spent in the appt was 20 minutes and more than 50% was on counseling and direct patient cares.   All of the patient's questions were answered with apparent satisfaction. The patient knows to call the clinic with any problems, questions or concerns.  Sullivan Lone MD MS AAHIVMS [facility] Hematology/Oncology Physician [facility]  (Office):       [phone] (Work cell):  [phone] (Fax):           [phone]  07/14/2019 10:26 AM  I, Dawayne Cirri am acting as a scribe for Dr. Sullivan Lone.   .I have reviewed the above documentation for accuracy and completeness, and I agree with the above. Brunetta Genera MD

## 2019-07-14 ENCOUNTER — Telehealth: Payer: Self-pay | Admitting: Hematology

## 2019-07-14 ENCOUNTER — Other Ambulatory Visit: Payer: Self-pay

## 2019-07-14 ENCOUNTER — Ambulatory Visit: Payer: No Typology Code available for payment source | Admitting: Hematology

## 2019-07-14 ENCOUNTER — Inpatient Hospital Stay: Payer: Medicare HMO | Attending: Hematology

## 2019-07-14 ENCOUNTER — Other Ambulatory Visit: Payer: No Typology Code available for payment source

## 2019-07-14 ENCOUNTER — Inpatient Hospital Stay (HOSPITAL_BASED_OUTPATIENT_CLINIC_OR_DEPARTMENT_OTHER): Payer: Medicare HMO | Admitting: Hematology

## 2019-07-14 VITALS — BP 129/84 | HR 60 | Temp 97.2°F | Resp 18 | Ht 73.0 in | Wt 253.7 lb

## 2019-07-14 DIAGNOSIS — C83398 Diffuse large b-cell lymphoma of other extranodal and solid organ sites: Secondary | ICD-10-CM

## 2019-07-14 DIAGNOSIS — C8339 Diffuse large B-cell lymphoma, extranodal and solid organ sites: Secondary | ICD-10-CM

## 2019-07-14 DIAGNOSIS — C8335 Diffuse large B-cell lymphoma, lymph nodes of inguinal region and lower limb: Secondary | ICD-10-CM | POA: Diagnosis not present

## 2019-07-14 DIAGNOSIS — I2510 Atherosclerotic heart disease of native coronary artery without angina pectoris: Secondary | ICD-10-CM | POA: Diagnosis not present

## 2019-07-14 DIAGNOSIS — Z79899 Other long term (current) drug therapy: Secondary | ICD-10-CM | POA: Diagnosis not present

## 2019-07-14 DIAGNOSIS — A059 Bacterial foodborne intoxication, unspecified: Secondary | ICD-10-CM | POA: Insufficient documentation

## 2019-07-14 LAB — CMP (CANCER CENTER ONLY)
ALT: 24 U/L (ref 0–44)
AST: 22 U/L (ref 15–41)
Albumin: 3.7 g/dL (ref 3.5–5.0)
Alkaline Phosphatase: 56 U/L (ref 38–126)
Anion gap: 9 (ref 5–15)
BUN: 14 mg/dL (ref 8–23)
CO2: 22 mmol/L (ref 22–32)
Calcium: 8.7 mg/dL — ABNORMAL LOW (ref 8.9–10.3)
Chloride: 107 mmol/L (ref 98–111)
Creatinine: 1.11 mg/dL (ref 0.61–1.24)
GFR, Est AFR Am: >60 mL/min (ref >60)
GFR, Estimated: >60 mL/min (ref >60)
Glucose, Bld: 110 mg/dL — ABNORMAL HIGH (ref 70–99)
Potassium: 4 mmol/L (ref 3.5–5.1)
Sodium: 138 mmol/L (ref 135–145)
Total Bilirubin: 1.5 mg/dL — ABNORMAL HIGH (ref 0.3–1.2)
Total Protein: 6.8 g/dL (ref 6.5–8.1)

## 2019-07-14 LAB — CBC WITH DIFFERENTIAL (CANCER CENTER ONLY)
Abs Immature Granulocytes: 0.02 10*3/uL (ref 0.00–0.07)
Basophils Absolute: 0.1 10*3/uL (ref 0.0–0.1)
Basophils Relative: 1 %
Eosinophils Absolute: 0.2 10*3/uL (ref 0.0–0.5)
Eosinophils Relative: 5 %
HCT: 42.4 % (ref 39.0–52.0)
Hemoglobin: 14 g/dL (ref 13.0–17.0)
Immature Granulocytes: 0 %
Lymphocytes Relative: 28 %
Lymphs Abs: 1.4 10*3/uL (ref 0.7–4.0)
MCH: 31.9 pg (ref 26.0–34.0)
MCHC: 33 g/dL (ref 30.0–36.0)
MCV: 96.6 fL (ref 80.0–100.0)
Monocytes Absolute: 0.7 10*3/uL (ref 0.1–1.0)
Monocytes Relative: 14 %
Neutro Abs: 2.5 10*3/uL (ref 1.7–7.7)
Neutrophils Relative %: 52 %
Platelet Count: 162 10*3/uL (ref 150–400)
RBC: 4.39 MIL/uL (ref 4.22–5.81)
RDW: 11.9 % (ref 11.5–15.5)
WBC Count: 4.8 10*3/uL (ref 4.0–10.5)
nRBC: 0 % (ref 0.0–0.2)

## 2019-07-14 LAB — LACTATE DEHYDROGENASE: LDH: 155 U/L (ref 98–192)

## 2019-07-14 NOTE — Telephone Encounter (Signed)
Scheduled apt per 6/4 los - gave patient AVS and calender

## 2019-08-17 DIAGNOSIS — I10 Essential (primary) hypertension: Secondary | ICD-10-CM | POA: Diagnosis not present

## 2019-08-17 DIAGNOSIS — I255 Ischemic cardiomyopathy: Secondary | ICD-10-CM | POA: Diagnosis not present

## 2019-08-17 DIAGNOSIS — E785 Hyperlipidemia, unspecified: Secondary | ICD-10-CM | POA: Diagnosis not present

## 2019-08-17 DIAGNOSIS — I2510 Atherosclerotic heart disease of native coronary artery without angina pectoris: Secondary | ICD-10-CM | POA: Diagnosis not present

## 2019-08-17 DIAGNOSIS — R7303 Prediabetes: Secondary | ICD-10-CM | POA: Diagnosis not present

## 2019-09-13 ENCOUNTER — Telehealth: Payer: Self-pay | Admitting: Hematology

## 2019-09-13 NOTE — Telephone Encounter (Signed)
Rescheduled 09/03 to 09/08 per provider pal, called and left a voicemail.

## 2019-10-06 ENCOUNTER — Telehealth: Payer: Self-pay | Admitting: Hematology

## 2019-10-06 NOTE — Telephone Encounter (Signed)
Called patient regarding voicemail that was left, patient will be notified of upcoming appointments.

## 2019-10-12 ENCOUNTER — Ambulatory Visit (INDEPENDENT_AMBULATORY_CARE_PROVIDER_SITE_OTHER): Payer: Medicare HMO | Admitting: Internal Medicine

## 2019-10-12 ENCOUNTER — Encounter: Payer: Self-pay | Admitting: Internal Medicine

## 2019-10-12 ENCOUNTER — Other Ambulatory Visit: Payer: Self-pay

## 2019-10-12 VITALS — BP 110/60 | HR 77 | Ht 73.0 in | Wt 256.6 lb

## 2019-10-12 DIAGNOSIS — T66XXXS Radiation sickness, unspecified, sequela: Secondary | ICD-10-CM

## 2019-10-12 DIAGNOSIS — Z8546 Personal history of malignant neoplasm of prostate: Secondary | ICD-10-CM

## 2019-10-12 NOTE — Patient Instructions (Signed)
-   You will be scheduled for labs in the next week or two, Please make sure you are here by 8 AM that day and fasting ( you can have water but no food  Before your labs)

## 2019-10-12 NOTE — Progress Notes (Signed)
Name: Carl [name]  MRN/ DOB: [id number], 05/03/1940    Age/ Sex: 79 y.o., male     PCP: [name], MD   Reason for Endocrinology Evaluation: Testicular radiotherapy     Initial Endocrinology Clinic Visit: 04/11/2019    PATIENT IDENTIFIER: Carl [name] is a 79 y.o., male with a past medical history of CAD ( S/P DES 2016) and HTN . He has followed with [facility] since 04/11/2019 for consultative assistance with management of his Testicular radiotherapy  HISTORICAL SUMMARY:  Pt was diagnosed with testicular lymphoma in 01/2018. He is S/P chemo and scrotal Radiation therapy, he completed radiation therapy in 09/2018.   He was referred here to evaluate , monitor and treat for hypogonadism should it develop following radiation exposure. Sees Dr. Terrence Dupont (cardiology ) SUBJECTIVE:    Today (10/12/2019):  Carl [name] is here for follow up on testicular exposure to radiation.  Pt forgot his hearing aids today and it was very difficult to converse with the pt.    He has tired.  He did not notice weight gain but in review of his weight flow chart, weight gain has been noted.   He is not sexually active , he does endorses seldom spontaneous erections.    HISTORY:  Past Medical History:  Past Medical History:  Diagnosis Date  . Arthritis   . Coronary artery disease   . HOH (hard of hearing)   . Hyperlipidemia   . Hypertension   . Ischemic cardiomyopathy   . Myocardial infarction ([place]) 2016  . Quadriceps tendon rupture    LEFT   Past Surgical History:  Past Surgical History:  Procedure Laterality Date  . BIOPSY  04/27/2018   Procedure: BIOPSY;  Surgeon: Ronnette Juniper, MD;  Location: [facility];  Service: Gastroenterology;;  . CARDIAC CATHETERIZATION N/A 10/15/2014   Procedure: Left Heart Cath and Coronary Angiography;  Surgeon: Charolette Forward, MD;  Location: [facility];  Service: Cardiovascular;  Laterality: N/A;  . COLONOSCOPY     . ESOPHAGOGASTRODUODENOSCOPY (EGD) WITH PROPOFOL N/A 04/27/2018   Procedure: ESOPHAGOGASTRODUODENOSCOPY (EGD) WITH PROPOFOL;  Surgeon: Ronnette Juniper, MD;  Location: [facility];  Service: Gastroenterology;  Laterality: N/A;  . IR IMAGING GUIDED PORT INSERTION  03/14/2018  . MICROLARYNGOSCOPY WITH CO2 LASER AND EXCISION OF VOCAL CORD LESION N/A 11/26/2017   Procedure: MICROLARYNGOSCOPY WITH CO2 LASER AND EXCISION OF VOCAL CORD LESION WITH JET VENTILATION;  Surgeon: Melida Quitter, MD;  Location: [facility];  Service: ENT;  Laterality: N/A;  . QUADRICEPS TENDON REPAIR Left 02/20/2014   Procedure: LEFT REPAIR QUADRICEP TENDON;  Surgeon: Mauri Pole, MD;  Location: [facility];  Service: Orthopedics;  Laterality: Left;  . THROAT SURGERY  2010    Social History:  reports that he has never smoked. He has never used smokeless tobacco. He reports previous alcohol use. He reports that he does not use drugs.  Family History: History reviewed. No pertinent family history.   HOME MEDICATIONS: Allergies as of 10/12/2019   No Known Allergies     Medication List       Accurate as of October 12, 2019  9:27 AM. If you have any questions, ask your nurse or doctor.        STOP taking these medications   senna-docusate 8.6-50 MG tablet Commonly known as: Senna S Stopped by: Dorita Sciara, MD     TAKE these medications   aspirin EC 81 MG tablet Take 81 mg by mouth  daily.   atorvastatin 40 MG tablet Commonly known as: LIPITOR Take 1 tablet (40 mg total) by mouth daily at 6 PM.   CALCIUM 1200 PO Take 1 tablet by mouth daily after breakfast.   carvedilol 6.25 MG tablet Commonly known as: COREG Take 3.125 mg by mouth 2 (two) times daily with a meal.   cholecalciferol 1000 units tablet Commonly known as: VITAMIN D Take 5,000 Units by mouth daily.   losartan 25 MG tablet Commonly known as: COZAAR Take 25 mg by mouth daily after breakfast.   multivitamin with minerals Tabs tablet Take 1  tablet by mouth daily.   nitroGLYCERIN 0.4 MG SL tablet Commonly known as: NITROSTAT Place 1 tablet (0.4 mg total) under the tongue every 5 (five) minutes x 3 doses as needed for chest pain.   SYSTANE OP Place 1 drop into both eyes at bedtime.   Vaseline Gel Generic drug: white petrolatum Apply 1 application topically as needed for dry skin (itching).         OBJECTIVE:   PHYSICAL EXAM: VS: BP 110/60 (BP Location: Left Arm, Patient Position: Sitting, Cuff Size: Normal)   Pulse 77   Ht 6\' 1"  (1.854 m)   Wt 256 lb 9.6 oz (116.4 kg)   SpO2 98%   BMI 33.85 kg/m    EXAM: General: Pt appears well and is in NAD  Chest : Pseudogynecomastia B/L noted ( no glandular tissue)  Lungs: Clear with good BS bilat with no rales, rhonchi, or wheezes  Heart: Auscultation: RRR.  Abdomen: Normoactive bowel sounds, soft, nontender, without masses or organomegaly palpable  Extremities:  BL LE: No pretibial edema normal ROM and strength.  Skin: Hair: Texture and amount normal with gender appropriate distribution Skin Inspection: No rashes  Mental Status: Judgment, insight: Intact Orientation: Oriented to time, place, and person Mood and affect: No depression, anxiety, or agitation     DATA REVIEWED:  Results for Carl [name], Carl [name] (MRN [id number]) as of 10/12/2019 10:12  Ref. Range 07/14/2019 08:53  Sodium Latest Ref Range: 135 - 145 mmol/L 138  Potassium Latest Ref Range: 3.5 - 5.1 mmol/L 4.0  Chloride Latest Ref Range: 98 - 111 mmol/L 107  CO2 Latest Ref Range: 22 - 32 mmol/L 22  Glucose Latest Ref Range: 70 - 99 mg/dL 110 (H)  BUN Latest Ref Range: 8 - 23 mg/dL 14  Creatinine Latest Ref Range: 0.61 - 1.24 mg/dL 1.11  Calcium Latest Ref Range: 8.9 - 10.3 mg/dL 8.7 (L)  Anion gap Latest Ref Range: 5 - 15  9  Alkaline Phosphatase Latest Ref Range: 38 - 126 U/L 56  Albumin Latest Ref Range: 3.5 - 5.0 g/dL 3.7  AST Latest Ref Range: 15 - 41 U/L 22  ALT Latest Ref Range: 0 - 44 U/L 24   Total Protein Latest Ref Range: 6.5 - 8.1 g/dL 6.8  Total Bilirubin Latest Ref Range: 0.3 - 1.2 mg/dL 1.5 (H)  GFR, Est Non African American Latest Ref Range: >60 mL/min >60  GFR, Est African American Latest Ref Range: >60 mL/min >60  WBC Latest Ref Range: 4.0 - 10.5 K/uL 4.8  RBC Latest Ref Range: 4.22 - 5.81 MIL/uL 4.39  Hemoglobin Latest Ref Range: 13.0 - 17.0 g/dL 14.0  HCT Latest Ref Range: 39 - 52 % 42.4  MCV Latest Ref Range: 80.0 - 100.0 fL 96.6  MCH Latest Ref Range: 26.0 - 34.0 pg 31.9  MCHC Latest Ref Range: 30.0 - 36.0 g/dL 33.0  RDW Latest  Ref Range: 11.5 - 15.5 % 11.9  Platelets Latest Ref Range: 150 - 400 K/uL 162  nRBC Latest Ref Range: 0.0 - 0.2 % 0.0  Neutrophils Latest Units: % 52  Lymphocytes Latest Units: % 28  Monocytes Relative Latest Units: % 14  Eosinophil Latest Units: % 5  Basophil Latest Units: % 1  Immature Granulocytes Latest Units: % 0  NEUT# Latest Ref Range: 1.7 - 7.7 K/uL 2.5  Lymphocyte # Latest Ref Range: 0.7 - 4.0 K/uL 1.4  Monocyte # Latest Ref Range: 0 - 1 K/uL 0.7  Eosinophils Absolute Latest Ref Range: 0 - 0 K/uL 0.2  Basophils Absolute Latest Ref Range: 0 - 0 K/uL 0.1  Abs Immature Granulocytes Latest Ref Range: 0.00 - 0.07 K/uL 0.02    ASSESSMENT / PLAN / RECOMMENDATIONS:    1. Testicular exposure to radiation:   - Pt endorses nipple soreness but on exam there was no glandular tissues.  - History limited today as the pt forgot his hearing aids - Will repeat testosterone check fasting an at 8 AM  F/U in 1 but sooner should labs come back abnormal     Signed electronically by: Mack Guise, MD  [facility]  [facility] [address] [place], [place] [address] Phone: [phone] FAX: [phone]      CC: [name], [place] [address] Phone: [phone]  Fax: [phone]   Return to Endocrinology clinic as below: Future Appointments   Date Time Provider [place]  10/12/2019  9:30 AM Darrio Bade, [name], MD [facility] None  10/18/2019  2:00 PM [facility] None  10/18/2019  2:40 PM Brunetta Genera, MD [facility] None

## 2019-10-13 ENCOUNTER — Ambulatory Visit: Payer: Medicare HMO | Admitting: Hematology

## 2019-10-13 ENCOUNTER — Other Ambulatory Visit: Payer: Medicare HMO

## 2019-10-18 ENCOUNTER — Telehealth: Payer: Self-pay | Admitting: Hematology

## 2019-10-18 ENCOUNTER — Other Ambulatory Visit: Payer: Self-pay

## 2019-10-18 ENCOUNTER — Inpatient Hospital Stay: Payer: Medicare HMO | Attending: Hematology

## 2019-10-18 ENCOUNTER — Inpatient Hospital Stay (HOSPITAL_BASED_OUTPATIENT_CLINIC_OR_DEPARTMENT_OTHER): Payer: Medicare HMO | Admitting: Hematology

## 2019-10-18 VITALS — BP 104/66 | HR 54 | Temp 97.5°F | Resp 18 | Ht 73.0 in | Wt 258.3 lb

## 2019-10-18 DIAGNOSIS — Z79899 Other long term (current) drug therapy: Secondary | ICD-10-CM | POA: Insufficient documentation

## 2019-10-18 DIAGNOSIS — Z5111 Encounter for antineoplastic chemotherapy: Secondary | ICD-10-CM

## 2019-10-18 DIAGNOSIS — I2510 Atherosclerotic heart disease of native coronary artery without angina pectoris: Secondary | ICD-10-CM | POA: Diagnosis not present

## 2019-10-18 DIAGNOSIS — R490 Dysphonia: Secondary | ICD-10-CM | POA: Diagnosis not present

## 2019-10-18 DIAGNOSIS — I700 Atherosclerosis of aorta: Secondary | ICD-10-CM | POA: Diagnosis not present

## 2019-10-18 DIAGNOSIS — I252 Old myocardial infarction: Secondary | ICD-10-CM | POA: Insufficient documentation

## 2019-10-18 DIAGNOSIS — C8339 Diffuse large B-cell lymphoma, extranodal and solid organ sites: Secondary | ICD-10-CM | POA: Diagnosis not present

## 2019-10-18 DIAGNOSIS — B356 Tinea cruris: Secondary | ICD-10-CM | POA: Diagnosis not present

## 2019-10-18 LAB — CBC WITH DIFFERENTIAL/PLATELET
Abs Immature Granulocytes: 0.03 10*3/uL (ref 0.00–0.07)
Basophils Absolute: 0.1 10*3/uL (ref 0.0–0.1)
Basophils Relative: 1 %
Eosinophils Absolute: 0.2 10*3/uL (ref 0.0–0.5)
Eosinophils Relative: 3 %
HCT: 40.5 % (ref 39.0–52.0)
Hemoglobin: 13.9 g/dL (ref 13.0–17.0)
Immature Granulocytes: 1 %
Lymphocytes Relative: 24 %
Lymphs Abs: 1.5 10*3/uL (ref 0.7–4.0)
MCH: 32.6 pg (ref 26.0–34.0)
MCHC: 34.3 g/dL (ref 30.0–36.0)
MCV: 95.1 fL (ref 80.0–100.0)
Monocytes Absolute: 0.7 10*3/uL (ref 0.1–1.0)
Monocytes Relative: 11 %
Neutro Abs: 3.7 10*3/uL (ref 1.7–7.7)
Neutrophils Relative %: 60 %
Platelets: 178 10*3/uL (ref 150–400)
RBC: 4.26 MIL/uL (ref 4.22–5.81)
RDW: 11.9 % (ref 11.5–15.5)
WBC: 6.2 10*3/uL (ref 4.0–10.5)
nRBC: 0 % (ref 0.0–0.2)

## 2019-10-18 LAB — CMP (CANCER CENTER ONLY)
ALT: 21 U/L (ref 0–44)
AST: 25 U/L (ref 15–41)
Albumin: 3.7 g/dL (ref 3.5–5.0)
Alkaline Phosphatase: 57 U/L (ref 38–126)
Anion gap: 7 (ref 5–15)
BUN: 21 mg/dL (ref 8–23)
CO2: 25 mmol/L (ref 22–32)
Calcium: 8.8 mg/dL — ABNORMAL LOW (ref 8.9–10.3)
Chloride: 108 mmol/L (ref 98–111)
Creatinine: 1.08 mg/dL (ref 0.61–1.24)
GFR, Est AFR Am: >60 mL/min (ref >60)
GFR, Estimated: >60 mL/min (ref >60)
Glucose, Bld: 109 mg/dL — ABNORMAL HIGH (ref 70–99)
Potassium: 4.1 mmol/L (ref 3.5–5.1)
Sodium: 140 mmol/L (ref 135–145)
Total Bilirubin: 1.5 mg/dL — ABNORMAL HIGH (ref 0.3–1.2)
Total Protein: 6.9 g/dL (ref 6.5–8.1)

## 2019-10-18 LAB — LACTATE DEHYDROGENASE: LDH: 176 U/L (ref 98–192)

## 2019-10-18 NOTE — Telephone Encounter (Signed)
Scheduled appointments per 9/8 los. Patient is aware of appointments and I gave him a calendar print out.

## 2019-10-18 NOTE — Progress Notes (Signed)
HEMATOLOGY/ONCOLOGY CLINIC NOTE  Date of Service: 10/18/2019  Patient Care Team: [name], MD as PCP - General (Family Medicine)  CHIEF COMPLAINTS/PURPOSE OF CONSULTATION:  Primary testicular diffuse Large B-Cell Lymphoma   HISTORY OF PRESENTING ILLNESS:   Carl [name] is a wonderful 79 y.o. male who has been referred to [place] by Dr. [name] for evaluation and management of Diffuse Large B-Cell Lymphoma. The pt reports that he is doing well overall.  The pt notes that he first began feeling differently about 4-5 months ago, when he noticed left testicular swelling. He notes that the his testicle was "a little bit larger than a golf ball," and that this was not painful. The pt notes that he has not had any abdominal distension or abdominal pain. The pt denies any fevers, chills, night sweats, or unexpected weight loss. He also denies any leg swelling. The pt denies any trauma sustained by the testicles. He denies noticing any other related symptoms.   The pt reports that he believed he had food poisoning prior to this, as he had lots of diarrhea. He notes that he has observed a small knot on the back of his right wrist, which has not changed over the last year.  The pt has two stents in his heart which were placed in 2016. The pt notes that his blood pressure has been stable and well-controlled. He notes that his last ECHO was 6 months ago, and he has not had any CP or SOB since that time. He takes 81mg  aspirin daily. He also takes Atorvastatin. The pt denies feeling limited in his walking by SOB or CP. He notes that he could walk 2 miles before ceasing to walk, noting that feeling tired in his muscles would limit him.   The pt has recently pursued surgery on his throat with ENT Dr. Melida Quitter for altered voice and hoarseness related to his vocal cords.   The pt notes that he currently lives by himself, and is not married. He has a daughter who is 61 years old. He  notes that his daughter "tries to help out." He notes that he does not have a power of attorney, but that this would be his daughter.   Of note prior to the patient's visit today, pt has had a Left Testis Surgical biopsy completed on 01/05/18 with results revealing Diffuse Large B-Cell Lymphoma.   The pt also had an US Scrotum on 12/08/17 which revealed Abnormal appearance of the left testicle which is larger than the right and diffusely heterogeneous in echotexture. Cannot exclude infiltrating process/mass. Appearance is concerning for possible neoplasm.  Most recent lab results (11/26/17) of BMP revealed all values WNL except for Glucose at 104, Creatinine at 1.30, Calcium at 8.7, GFR at 59 11/26/17 Hemoglobin value at 13.6.   On review of systems, pt reports stable energy levels, left testicular swelling, and denies fevers, chills, night sweats, unexpected weight loss, CP, SOB, back pains, flank pains, abdominal pains, leg swelling, testicular pain, and any other symptoms.   On PMHx the pt reports CAD, two heart stents placed in 2016. On Social Hx the pt denies smoking.  On Family Hx the pt reports mother with lymphoma of the breast. Two sisters with breast cancer, one at age 31.   INTERVAL HISTORY:  Carl [name] returns today for management and evaluation of his Primary testicular Diffuse Large B-Cell Lymphoma. The patient's last visit with [place] was on 07/14/2019. The pt reports  that he is doing well overall.  The pt reports that he had some jock itch that has improved since he began using Gold Bond powder. Pt did have some testicular swelling when the jock itch was particularly bad, this has nearly resolved. He has been eating well and is even gaining weight. Pt is currently taking 5000 UT Vitamin D daily.  He had his COVID19 vaccines and is interested in receiving the booster.   Lab results today (10/18/19) of CBC w/diff and CMP is as follows: all values are WNL except for Glucose  at 109, Calcium at 8.8, Total Bilirubin at 1.5. 10/18/2019 LDH at 176  On review of systems, pt reports improved skin itching and denies fevers, chills, night sweats, unexpected weight loss, new lumps/bumps, testicular swelling, abdominal pain and any other symptoms.   MEDICAL HISTORY:  Past Medical History:  Diagnosis Date  . Arthritis   . Coronary artery disease   . HOH (hard of hearing)   . Hyperlipidemia   . Hypertension   . Ischemic cardiomyopathy   . Myocardial infarction ([place]) 2016  . Quadriceps tendon rupture    LEFT    SURGICAL HISTORY: Past Surgical History:  Procedure Laterality Date  . BIOPSY  04/27/2018   Procedure: BIOPSY;  Surgeon: Ronnette Juniper, MD;  Location: [facility];  Service: Gastroenterology;;  . CARDIAC CATHETERIZATION N/A 10/15/2014   Procedure: Left Heart Cath and Coronary Angiography;  Surgeon: Charolette Forward, MD;  Location: [facility];  Service: Cardiovascular;  Laterality: N/A;  . COLONOSCOPY    . ESOPHAGOGASTRODUODENOSCOPY (EGD) WITH PROPOFOL N/A 04/27/2018   Procedure: ESOPHAGOGASTRODUODENOSCOPY (EGD) WITH PROPOFOL;  Surgeon: Ronnette Juniper, MD;  Location: [facility];  Service: Gastroenterology;  Laterality: N/A;  . IR IMAGING GUIDED PORT INSERTION  03/14/2018  . MICROLARYNGOSCOPY WITH CO2 LASER AND EXCISION OF VOCAL CORD LESION N/A 11/26/2017   Procedure: MICROLARYNGOSCOPY WITH CO2 LASER AND EXCISION OF VOCAL CORD LESION WITH JET VENTILATION;  Surgeon: Melida Quitter, MD;  Location: [facility];  Service: ENT;  Laterality: N/A;  . QUADRICEPS TENDON REPAIR Left 02/20/2014   Procedure: LEFT REPAIR QUADRICEP TENDON;  Surgeon: Mauri Pole, MD;  Location: [facility];  Service: Orthopedics;  Laterality: Left;  . THROAT SURGERY  2010    SOCIAL HISTORY: Social History   Socioeconomic History  . Marital status: Divorced    Spouse name: Not on file  . Number of children: Not on file  . Years of education: Not on file  . Highest education level: Not on file   Occupational History  . Not on file  Tobacco Use  . Smoking status: Never Smoker  . Smokeless tobacco: Never Used  Vaping Use  . Vaping Use: Never used  Substance and Sexual Activity  . Alcohol use: Not Currently  . Drug use: No  . Sexual activity: Not on file  Other Topics Concern  . Not on file  Social History Narrative  . Not on file   Social Determinants of Health   Financial Resource Strain:   . Difficulty of Paying Living Expenses: Not on file  Food Insecurity:   . Worried About Charity fundraiser in the Last Year: Not on file  . Ran Out of Food in the Last Year: Not on file  Transportation Needs:   . Lack of Transportation (Medical): Not on file  . Lack of Transportation (Non-Medical): Not on file  Physical Activity:   . Days of Exercise per Week: Not on file  . Minutes  of Exercise per Session: Not on file  Stress:   . Feeling of Stress : Not on file  Social Connections:   . Frequency of Communication with Friends and Family: Not on file  . Frequency of Social Gatherings with Friends and Family: Not on file  . Attends Religious Services: Not on file  . Active Member of Clubs or Organizations: Not on file  . Attends Archivist Meetings: Not on file  . Marital Status: Not on file  Intimate Partner Violence:   . Fear of Current or Ex-Partner: Not on file  . Emotionally Abused: Not on file  . Physically Abused: Not on file  . Sexually Abused: Not on file    FAMILY HISTORY: No family history on file.  ALLERGIES:  has No Known Allergies.  MEDICATIONS:  Current Outpatient Medications  Medication Sig Dispense Refill  . aspirin EC 81 MG tablet Take 81 mg by mouth daily.    Marland Kitchen atorvastatin (LIPITOR) 40 MG tablet Take 1 tablet (40 mg total) by mouth daily at 6 PM. 60 tablet 3  . Calcium Carbonate-Vit D-Min (CALCIUM 1200 PO) Take 1 tablet by mouth daily after breakfast.    . carvedilol (COREG) 6.25 MG tablet Take 3.125 mg by mouth 2 (two) times daily  with a meal.     . cholecalciferol (VITAMIN D) 1000 UNITS tablet Take 5,000 Units by mouth daily.     Marland Kitchen losartan (COZAAR) 25 MG tablet Take 25 mg by mouth daily after breakfast.     . Multiple Vitamin (MULTIVITAMIN WITH MINERALS) TABS tablet Take 1 tablet by mouth daily.    . nitroGLYCERIN (NITROSTAT) 0.4 MG SL tablet Place 1 tablet (0.4 mg total) under the tongue every 5 (five) minutes x 3 doses as needed for chest pain. 25 tablet 12  . Polyethyl Glycol-Propyl Glycol (SYSTANE OP) Place 1 drop into both eyes at bedtime.     . white petrolatum (VASELINE) GEL Apply 1 application topically as needed for dry skin (itching).     No current facility-administered medications for this visit.   Facility-Administered Medications Ordered in Other Visits  Medication Dose Route Frequency Provider Last Rate Last Admin  . influenza vaccine adjuvanted (FLUAD) injection 0.5 mL  0.5 mL Intramuscular Once Irene Limbo Cloria Spring, MD        REVIEW OF SYSTEMS:   A 10+ POINT REVIEW OF SYSTEMS WAS OBTAINED including neurology, dermatology, psychiatry, cardiac, respiratory, lymph, extremities, GI, GU, Musculoskeletal, constitutional, breasts, reproductive, HEENT.  All pertinent positives are noted in the HPI.  All others are negative.   PHYSICAL EXAMINATION: ECOG PERFORMANCE STATUS: 2 - Symptomatic, <50% confined to bed  Vitals:   10/18/19 1510  BP: 104/66  Pulse: (!) 54  Resp: 18  Temp: (!) 97.5 F (36.4 C)  SpO2: 97%   Filed Weights   10/18/19 1510  Weight: 258 lb 4.8 oz (117.2 kg)   .Body mass index is 34.08 kg/m.   GENERAL:alert, in no acute distress and comfortable SKIN: no acute rashes, no significant lesions EYES: conjunctiva are pink and non-injected, sclera anicteric OROPHARYNX: MMM, no exudates, no oropharyngeal erythema or ulceration NECK: supple, no JVD LYMPH:  no palpable lymphadenopathy in the cervical, axillary or inguinal regions LUNGS: clear to auscultation b/l with normal  respiratory effort HEART: regular rate & rhythm ABDOMEN:  normoactive bowel sounds , non tender, not distended. No palpable hepatosplenomegaly.  Extremity: no pedal edema PSYCH: alert & oriented x 3 with fluent speech NEURO: no focal motor/sensory deficits  LABORATORY DATA:  I have reviewed the data as listed  . CBC Latest Ref Rng & Units 10/18/2019 07/14/2019 05/05/2019  WBC 4.0 - 10.5 K/uL 6.2 4.8 4.0  Hemoglobin 13.0 - 17.0 g/dL 13.9 14.0 14.1  Hematocrit 39 - 52 % 40.5 42.4 41.4  Platelets 150 - 400 K/uL 178 162 138.0(L)    . CMP Latest Ref Rng & Units 10/18/2019 07/14/2019 04/10/2019  Glucose 70 - 99 mg/dL 109(H) 110(H) 100(H)  BUN 8 - 23 mg/dL 21 14 20   Creatinine 0.61 - 1.24 mg/dL 1.08 1.11 1.10  Sodium 135 - 145 mmol/L 140 138 142  Potassium 3.5 - 5.1 mmol/L 4.1 4.0 4.5  Chloride 98 - 111 mmol/L 108 107 107  CO2 22 - 32 mmol/L 25 22 29   Calcium 8.9 - 10.3 mg/dL 8.8(L) 8.7(L) 9.0  Total Protein 6.5 - 8.1 g/dL 6.9 6.8 7.0  Total Bilirubin 0.3 - 1.2 mg/dL 1.5(H) 1.5(H) 1.4(H)  Alkaline Phos 38 - 126 U/L 57 56 58  AST 15 - 41 U/L 25 22 26   ALT 0 - 44 U/L 21 24 28    01/05/18 Pathology:    RADIOGRAPHIC STUDIES: I have personally reviewed the radiological images as listed and agreed with the findings in the report. No results found.  ASSESSMENT & PLAN:   79 y.o. male with  1. Primary Testicular Diffuse Large B-Cell Lymphoma, Stage 1E 11/30/16 NM Myocar Multi w/spect w/wall motion which revealed a LV EF of 44%  12/08/17 US Scrotum revealed Abnormal appearance of the left testicle which is larger than the right and diffusely heterogeneous in echotexture. Cannot exclude infiltrating process/mass. Appearance is concerning for possible neoplasm.  01/05/18 Left testes biopsy revealed Primary Testicular Diffuse Large B-Cell Lymphoma   01/25/18 Hep B, Hep C and HIV negative  01/31/18 ECHO revealed a LV EF of 51%. Left ventricle: The cavity size was normal. Wall motion was normal;  there were no regional wall motion abnormalities. Atrial septum: No defect or patent foramen ovale was identified.  02/07/18 PET/CT revealed No findings for metabolically active lymphoma involving the neck, chest, abdomen/pelvis or osseous structures.  Due to admission after first intrathecal methotrexate, concern for brain toxicity given patient's age, and concern for treatment tolerance, decided to hold IT MTX. No further concerns for bleeding, nausea, or vomiting.  08/23/2018 PET scan revealing Mildly hypermetabolic mildly enlarged AP window lymph node, stable since 02/07/2018 PET-CT. Low level metabolism associated with new mild patchy consolidation in the medial segment right middle lobe, potentially Inflammatory. New mildly hypermetabolic right hilar lymph nodes, nonspecific, potentially reactive to the right middle lobe process. Nonspecific patchy hypermetabolism within the right testis is increased. No discrete right testicular mass on the noncontrast CT Images. These findings are equivocal for active lymphoma. Suggest attention on follow-up PET-CT in 3-6 months. Aortic Atherosclerosis (ICD10-I70.0).  2.  Past Medical History:  Diagnosis Date  . Arthritis   . Coronary artery disease   . HOH (hard of hearing)   . Hyperlipidemia   . Hypertension   . Ischemic cardiomyopathy   . Myocardial infarction ([place]) 2016  . Quadriceps tendon rupture    LEFT    PLAN: -Discussed pt labwork today, 10/18/19; blood counts are nml, blood chemistries are okay, LDH is WNL -No lab or clinical evidence of Primary Testicular Diffuse Large B-Cell Lymphoma recurrence at this time.  -No indication for treatment at this time. Will continue watchful observation.  -Pt is currently 15 months out from last treatment. Will space out f/u  due to disease stability. -Discussed the CDC guidelines regarding COVID19 booster. Recommend pt receive as soon as possible.  -Continue 5000 UT Vitamin D daily -Will see back in 4  months with labs -Advised pt to contact sooner if rash worsens.    FOLLOW UP: RTC with Dr Irene Limbo with labs in 4 months   The total time spent in the appt was 20 minutes and more than 50% was on counseling and direct patient cares.  All of the patient's questions were answered with apparent satisfaction. The patient knows to call the clinic with any problems, questions or concerns.   Sullivan Lone MD [place] AAHIVMS [facility] Hematology/Oncology Physician [facility]  (Office):       [phone] (Work cell):  [phone] (Fax):           [phone]  10/18/2019 3:45 PM  I, Yevette Edwards, am acting as a scribe for Dr. Sullivan Lone.   .I have reviewed the above documentation for accuracy and completeness, and I agree with the above. Brunetta Genera MD

## 2019-10-19 ENCOUNTER — Other Ambulatory Visit: Payer: Medicare HMO

## 2019-10-19 DIAGNOSIS — Z5111 Encounter for antineoplastic chemotherapy: Secondary | ICD-10-CM

## 2019-10-19 DIAGNOSIS — C8339 Diffuse large B-cell lymphoma, extranodal and solid organ sites: Secondary | ICD-10-CM

## 2019-10-19 DIAGNOSIS — T66XXXS Radiation sickness, unspecified, sequela: Secondary | ICD-10-CM

## 2019-10-24 LAB — TESTOSTERONE FREE MS/DIALYSIS
Free Testosterone, Serum: 20 pg/mL — ABNORMAL LOW
Testosterone, Serum (Total): 329 ng/dL
Testosterone-% Free: 0.6 %

## 2019-10-25 ENCOUNTER — Telehealth: Payer: Self-pay | Admitting: Internal Medicine

## 2019-10-25 DIAGNOSIS — T66XXXS Radiation sickness, unspecified, sequela: Secondary | ICD-10-CM

## 2019-10-25 DIAGNOSIS — R7989 Other specified abnormal findings of blood chemistry: Secondary | ICD-10-CM

## 2019-10-25 NOTE — Telephone Encounter (Signed)
Carl [name],    Please let the pt know that his testosterone is slightly lower then it has been.  Based on his insurance requirements , a repeat is necessary if we decide to treat him.    Labs have to be done at 8 AM fasting . Please schedule him for that .    Thanks    Abby Nena Jordan, MD  [facility] Endocrinology  [facility] [address] [place], [place] [address] Phone: [phone] FAX: [phone]

## 2019-10-25 NOTE — Telephone Encounter (Signed)
Called pt and left voicemail requesting a call back. °

## 2019-10-26 NOTE — Telephone Encounter (Signed)
Patient returned call - asked if you could try calling him again when you were free. [phone]

## 2019-10-26 NOTE — Telephone Encounter (Signed)
Pt called back before I was able to return his phone call. Pt was given MD message, and another lab appt scheduled.

## 2019-11-01 ENCOUNTER — Other Ambulatory Visit: Payer: Self-pay | Admitting: Internal Medicine

## 2019-11-01 ENCOUNTER — Other Ambulatory Visit: Payer: Self-pay

## 2019-11-01 ENCOUNTER — Other Ambulatory Visit (INDEPENDENT_AMBULATORY_CARE_PROVIDER_SITE_OTHER): Payer: Medicare HMO

## 2019-11-01 DIAGNOSIS — T66XXXS Radiation sickness, unspecified, sequela: Secondary | ICD-10-CM | POA: Diagnosis not present

## 2019-11-01 DIAGNOSIS — E291 Testicular hypofunction: Secondary | ICD-10-CM | POA: Diagnosis not present

## 2019-11-01 DIAGNOSIS — R7989 Other specified abnormal findings of blood chemistry: Secondary | ICD-10-CM

## 2019-11-01 LAB — COMPREHENSIVE METABOLIC PANEL
ALT: 24 U/L (ref 0–53)
AST: 26 U/L (ref 0–37)
Albumin: 3.9 g/dL (ref 3.5–5.2)
Alkaline Phosphatase: 45 U/L (ref 39–117)
BUN: 19 mg/dL (ref 6–23)
CO2: 28 mEq/L (ref 19–32)
Calcium: 8.9 mg/dL (ref 8.4–10.5)
Chloride: 107 mEq/L (ref 96–112)
Creatinine, Ser: 1.13 mg/dL (ref 0.40–1.50)
GFR: 62.2 mL/min (ref >60.00)
Glucose, Bld: 101 mg/dL — ABNORMAL HIGH (ref 70–99)
Potassium: 4.3 mEq/L (ref 3.5–5.1)
Sodium: 140 mEq/L (ref 135–145)
Total Bilirubin: 1 mg/dL (ref 0.2–1.2)
Total Protein: 6.8 g/dL (ref 6.0–8.3)

## 2019-11-01 LAB — LUTEINIZING HORMONE: LH: 25.29 m[IU]/mL (ref 3.10–34.60)

## 2019-11-01 LAB — FOLLICLE STIMULATING HORMONE: FSH: 36.2 m[IU]/mL — ABNORMAL HIGH (ref 1.4–18.1)

## 2019-11-06 LAB — TESTOSTERONE FREE MS/DIALYSIS
Free Testosterone, Serum: 19 pg/mL — ABNORMAL LOW
Testosterone, Serum (Total): 371 ng/dL
Testosterone-% Free: 0.5 %

## 2019-11-07 ENCOUNTER — Telehealth: Payer: Self-pay | Admitting: Internal Medicine

## 2019-11-07 MED ORDER — TESTOSTERONE 20.25 MG/1.25GM (1.62%) TD GEL: 20.2500 mg | Freq: Every day | TRANSDERMAL | 5 refills | Status: DC

## 2019-11-07 NOTE — Telephone Encounter (Signed)
Left a voice mail on 11/07/2019 at noon  for a call back to discuss low testosterone results and starting androgel    Abby Nena Jordan, MD  [facility] Endocrinology  [facility] [address] [place], [place] [address] Phone: [phone] FAX: [phone]

## 2019-11-08 LAB — SPECIMEN STATUS REPORT

## 2019-11-08 LAB — SEX HORMONE BINDING GLOBULIN: Sex Hormone Binding: 84.9 nmol/L — ABNORMAL HIGH (ref 19.3–76.4)

## 2019-11-08 NOTE — Telephone Encounter (Signed)
Pt stopped by the office on 11/08/2019 , we discussed low testosterone level, he will started on low dose androgel at 1 pump to one shoulder daily .   Abby Nena Jordan, MD  [facility] Endocrinology  [facility] [address] [place], [place] [address] Phone: [phone] FAX: [phone]

## 2019-11-13 ENCOUNTER — Telehealth: Payer: Self-pay

## 2019-11-13 NOTE — Telephone Encounter (Signed)
Prior Josem Kaufmann will be processed when received.

## 2019-11-13 NOTE — Telephone Encounter (Signed)
Pt called to say [facility] will not fill Androgel stating provider has to approve the prescription. Please advise pt [phone].

## 2019-11-15 NOTE — Telephone Encounter (Signed)
Patient called asking why his medication has not been called in - I informed him of Shannon's last note stating we are waiting for the PA fax from pharmacy, he said he would call his pharmacy. FYI

## 2019-11-23 DIAGNOSIS — I10 Essential (primary) hypertension: Secondary | ICD-10-CM | POA: Diagnosis not present

## 2019-11-23 DIAGNOSIS — R7303 Prediabetes: Secondary | ICD-10-CM | POA: Diagnosis not present

## 2019-11-23 DIAGNOSIS — E785 Hyperlipidemia, unspecified: Secondary | ICD-10-CM | POA: Diagnosis not present

## 2019-11-23 DIAGNOSIS — I2510 Atherosclerotic heart disease of native coronary artery without angina pectoris: Secondary | ICD-10-CM | POA: Diagnosis not present

## 2019-11-23 DIAGNOSIS — I255 Ischemic cardiomyopathy: Secondary | ICD-10-CM | POA: Diagnosis not present

## 2019-11-24 IMAGING — PT NUCLEAR MEDICINE PET IMAGE RESTAGING (PS) SKULL BASE TO THIGH
1 of 8 series · 3 of 16 positions shown, 4 images · non-contrast
Comparison: 02/07/2018 PET-CT.

CLINICAL DATA: Subsequent treatment strategy for primary left
testicular diffuse large B-cell lymphoma status post left
orchiectomy and chemotherapy.

EXAM:
NUCLEAR MEDICINE PET SKULL BASE TO THIGH
TECHNIQUE: 12.0 mCi F-18 FDG was injected intravenously. Full-ring PET imaging
was performed from the skull base to thigh after the radiotracer. CT
data was obtained and used for attenuation correction and anatomic
localization.
Fasting blood glucose: 101 mg/dl

[Series 3: ct sk_thigh 5.0 hd_fov · axial · 1.17mm/px · z∈[-1362,-426]mm · 3 of 235 slices shown, 4 images]
[im 1/235  soft-tissue]
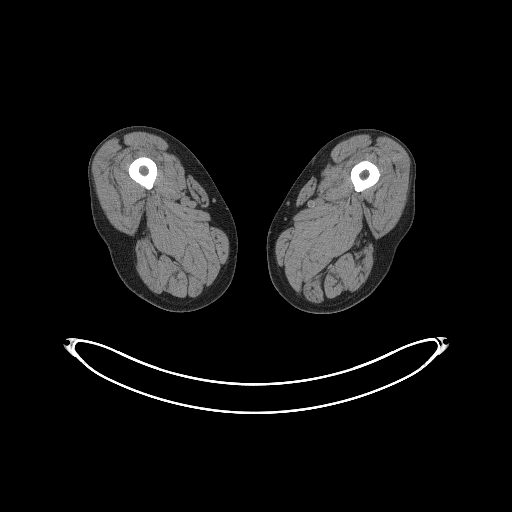
[im 1/235  bone]
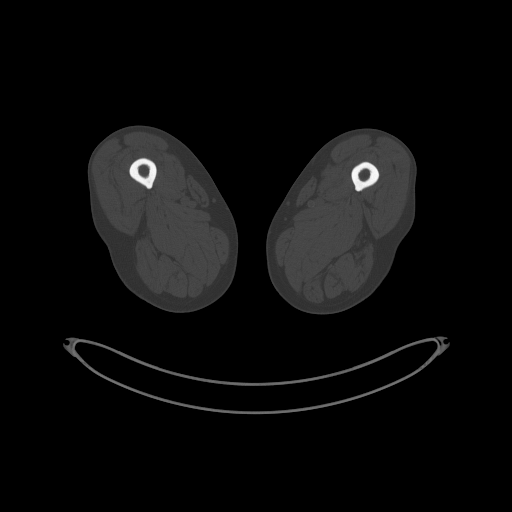
[im 118/235  soft-tissue]
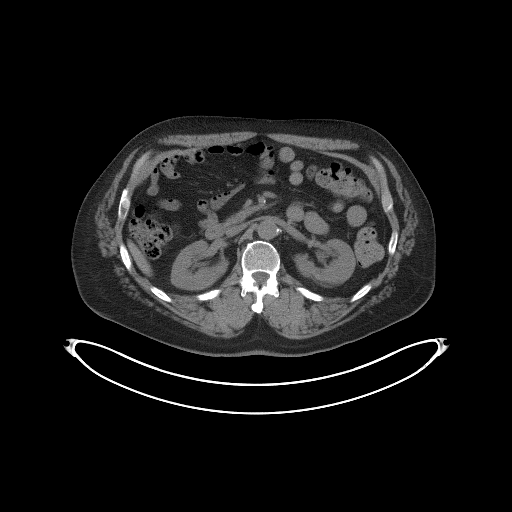
[im 235/235  soft-tissue]
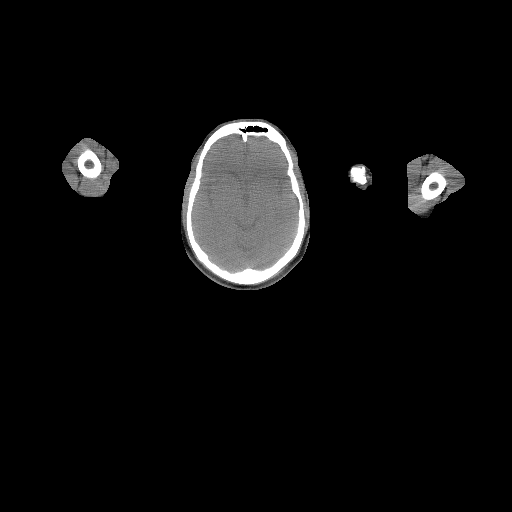

[3 of 16 positions shown; findings below may reference images not displayed]

FINDINGS: Mediastinal blood pool activity: SUV max

Liver activity: SUV max

NECK: No hypermetabolic lymph nodes in the neck.

Incidental CT findings: Mucous retention cyst versus polyp in the
inferior left maxillary sinus, stable.

CHEST: Mildly enlarged and mildly hypermetabolic 1.0 cm AP window
node with max SUV 3.9 (series 3/image 62), previously 1.1 cm with
max SUV 4.1, not appreciably changed. No new enlarged or
hypermetabolic mediastinal nodes.

Mild hypermetabolism within right hilar lymph nodes with max SUV
4.3, new from prior. No hypermetabolic left hilar nodes.

No enlarged or hypermetabolic axillary lymph nodes.

There is low level metabolism (max SUV 2.5) associated with a new
mild patchy consolidation in the medial segment right middle lobe
(series 8/image 48).

Incidental CT findings: Anterior left lower lobe 3 mm solid
pulmonary nodule (series 8/image 31), stable. No new significant
pulmonary nodules. Stable symmetric mild bilateral gynecomastia.
Coronary atherosclerosis. Right internal jugular Port-A-Cath
terminates at the cavoatrial junction. Atherosclerotic nonaneurysmal
thoracic aorta.

ABDOMEN/PELVIS: No abnormal hypermetabolic activity within the
liver, pancreas, adrenal glands, or spleen. No hypermetabolic lymph
nodes in the abdomen or pelvis.

Patchy hypermetabolism within the right testis with max SUV 5.6,
previous max SUV 3.7, increased. No discrete right testicular mass
on the CT images. Left orchiectomy.

Incidental CT findings: Simple 1.2 cm upper right renal cyst.
Atherosclerotic nonaneurysmal abdominal aorta. Moderate diffuse
colonic diverticulosis. Mild prostatomegaly. Chronic diffuse bladder
wall thickening.

SKELETON: No focal hypermetabolic activity to suggest skeletal
metastasis.

Incidental CT findings: none
IMPRESSION: 1. Mildly hypermetabolic mildly enlarged AP window lymph node,
stable since 02/07/2018 PET-CT.
2. Low level metabolism associated with new mild patchy
consolidation in the medial segment right middle lobe, potentially
inflammatory.
3. New mildly hypermetabolic right hilar lymph nodes, nonspecific,
potentially reactive to the right middle lobe process.
4. Nonspecific patchy hypermetabolism within the right testis is
increased. No discrete right testicular mass on the noncontrast CT
images.
5. These findings are equivocal for active lymphoma. Suggest
attention on follow-up PET-CT in 3-6 months.
6.  Aortic Atherosclerosis ([id number]).

## 2019-12-04 ENCOUNTER — Telehealth: Payer: Self-pay | Admitting: Internal Medicine

## 2019-12-04 NOTE — Telephone Encounter (Signed)
PA completed.

## 2019-12-04 NOTE — Telephone Encounter (Deleted)
Spoken to patient's wife Butch Penny) on [place], she stated that she was wondering if possible for patient to take less pills. She stated that patient have been more forgetful recently. He does not check his sugar and she had call to remind him to take his medications he still forget. She is in [place] and taking care of her mother, right now so it is tough. Please advise.

## 2019-12-04 NOTE — Telephone Encounter (Signed)
error 

## 2019-12-04 NOTE — Telephone Encounter (Signed)
[facility] calling asking if we had gotten the PA for testosterone through covermymeds and they asked if it could be urgent whenever we do get it.  Key number - [id number]

## 2019-12-21 DIAGNOSIS — Z0000 Encounter for general adult medical examination without abnormal findings: Secondary | ICD-10-CM | POA: Diagnosis not present

## 2019-12-21 DIAGNOSIS — I10 Essential (primary) hypertension: Secondary | ICD-10-CM | POA: Diagnosis not present

## 2019-12-21 DIAGNOSIS — E7800 Pure hypercholesterolemia, unspecified: Secondary | ICD-10-CM | POA: Diagnosis not present

## 2019-12-21 DIAGNOSIS — I700 Atherosclerosis of aorta: Secondary | ICD-10-CM | POA: Diagnosis not present

## 2019-12-28 ENCOUNTER — Other Ambulatory Visit: Payer: Self-pay | Admitting: Internal Medicine

## 2019-12-28 DIAGNOSIS — E291 Testicular hypofunction: Secondary | ICD-10-CM | POA: Insufficient documentation

## 2020-01-01 DIAGNOSIS — R7303 Prediabetes: Secondary | ICD-10-CM | POA: Diagnosis not present

## 2020-01-01 DIAGNOSIS — I10 Essential (primary) hypertension: Secondary | ICD-10-CM | POA: Diagnosis not present

## 2020-01-01 DIAGNOSIS — E785 Hyperlipidemia, unspecified: Secondary | ICD-10-CM | POA: Diagnosis not present

## 2020-02-12 ENCOUNTER — Encounter: Payer: Self-pay | Admitting: Internal Medicine

## 2020-02-12 ENCOUNTER — Other Ambulatory Visit: Payer: Self-pay

## 2020-02-12 ENCOUNTER — Ambulatory Visit: Payer: Medicare PPO | Admitting: Internal Medicine

## 2020-02-12 VITALS — BP 100/70 | HR 76 | Ht 73.0 in | Wt 258.2 lb

## 2020-02-12 DIAGNOSIS — E291 Testicular hypofunction: Secondary | ICD-10-CM | POA: Diagnosis not present

## 2020-02-12 LAB — COMPREHENSIVE METABOLIC PANEL
ALT: 30 U/L (ref 0–53)
AST: 28 U/L (ref 0–37)
Albumin: 4.3 g/dL (ref 3.5–5.2)
Alkaline Phosphatase: 50 U/L (ref 39–117)
BUN: 19 mg/dL (ref 6–23)
CO2: 29 mEq/L (ref 19–32)
Calcium: 8.8 mg/dL (ref 8.4–10.5)
Chloride: 102 mEq/L (ref 96–112)
Creatinine, Ser: 1.18 mg/dL (ref 0.40–1.50)
GFR: 57.74 mL/min — ABNORMAL LOW (ref >60.00)
Glucose, Bld: 100 mg/dL — ABNORMAL HIGH (ref 70–99)
Potassium: 4.3 mEq/L (ref 3.5–5.1)
Sodium: 138 mEq/L (ref 135–145)
Total Bilirubin: 1.2 mg/dL (ref 0.2–1.2)
Total Protein: 6.9 g/dL (ref 6.0–8.3)

## 2020-02-12 LAB — CBC
HCT: 44.2 % (ref 39.0–52.0)
Hemoglobin: 14.8 g/dL (ref 13.0–17.0)
MCHC: 33.6 g/dL (ref 30.0–36.0)
MCV: 95.3 fl (ref 78.0–100.0)
Platelets: 135 10*3/uL — ABNORMAL LOW (ref 150.0–400.0)
RBC: 4.64 Mil/uL (ref 4.22–5.81)
RDW: 12.7 % (ref 11.5–15.5)
WBC: 5.3 10*3/uL (ref 4.0–10.5)

## 2020-02-12 LAB — PSA: PSA: 2.15 ng/mL (ref 0.10–4.00)

## 2020-02-12 LAB — TESTOSTERONE: Testosterone: 272.69 ng/dL — ABNORMAL LOW (ref 300.00–890.00)

## 2020-02-12 NOTE — Progress Notes (Signed)
Name: Carl [name]  MRN/ DOB: [id number], 14-Aug-1940    Age/ Sex: 80 y.o., male     PCP: Carl Cruel, MD   Reason for Endocrinology Evaluation: Testicular radiotherapy     Initial Endocrinology Clinic Visit: 04/11/2019    PATIENT IDENTIFIER: Carl [name] is a 80 y.o., male with a past medical history of CAD ( S/P DES 2016) and HTN . He has followed with [facility] since 04/11/2019 for consultative assistance with management of his Testicular radiotherapy  HISTORICAL SUMMARY:  Pt was diagnosed with testicular lymphoma in 01/2018. He is S/P chemo and scrotal Radiation therapy, he completed radiation therapy in 09/2018.   He was referred here to evaluate , monitor and treat for hypogonadism should it develop following radiation exposure. Sees Dr. 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   He denies any change in energy level Denies rash  Has noted slight increase in frequency. No worsening of nocturia.  He is not sexually active , he does endorses seldom spontaneous erections.    He initially started with 1 pump to a shoulder at first but somehow increased it to 1 pump to each shoulder     HISTORY:  Past Medical History:  Past Medical History:  Diagnosis Date  . Arthritis   . Coronary artery disease   . HOH (hard of hearing)   . Hyperlipidemia   . Hypertension   . Ischemic cardiomyopathy   . Myocardial infarction ([place]) 2016  . Quadriceps tendon rupture    LEFT   Past Surgical History:  Past Surgical History:  Procedure Laterality Date  . BIOPSY  04/27/2018   Procedure: BIOPSY;  Surgeon: Ronnette Juniper, MD;  Location: [facility];  Service: Gastroenterology;;  . CARDIAC CATHETERIZATION N/A 10/15/2014    Procedure: Left Heart Cath and Coronary Angiography;  Surgeon: Charolette Forward, MD;  Location: [facility] [facility];  Service: Cardiovascular;  Laterality: N/A;  . COLONOSCOPY    . ESOPHAGOGASTRODUODENOSCOPY (EGD) WITH PROPOFOL N/A 04/27/2018   Procedure: ESOPHAGOGASTRODUODENOSCOPY (EGD) WITH PROPOFOL;  Surgeon: Ronnette Juniper, MD;  Location: [facility];  Service: Gastroenterology;  Laterality: N/A;  . IR IMAGING GUIDED PORT INSERTION  03/14/2018  . MICROLARYNGOSCOPY WITH CO2 LASER AND EXCISION OF VOCAL CORD LESION N/A 11/26/2017   Procedure: MICROLARYNGOSCOPY WITH CO2 LASER AND EXCISION OF VOCAL CORD LESION WITH JET VENTILATION;  Surgeon: Melida Quitter, MD;  Location: [facility];  Service: ENT;  Laterality: N/A;  . QUADRICEPS TENDON REPAIR Left 02/20/2014   Procedure: LEFT REPAIR QUADRICEP TENDON;  Surgeon: Mauri Pole, MD;  Location: [facility];  Service: Orthopedics;  Laterality: Left;  . THROAT SURGERY  2010    Social History:  reports that he has never smoked. He has never used smokeless tobacco. He reports previous alcohol use. He reports that he does not use drugs.  Family History: No family history on file.   HOME MEDICATIONS: Allergies as of 02/12/2020   No Known Allergies     Medication List       Accurate as of February 12, 2020  1:35 PM. If you have any questions, ask your nurse or doctor.        aspirin EC 81 MG tablet Take 81 mg by mouth daily.  atorvastatin 40 MG tablet Commonly known as: LIPITOR Take 1 tablet (40 mg total) by mouth daily at 6 PM.   CALCIUM 1200 PO Take 1 tablet by mouth daily after breakfast.   carvedilol 6.25 MG tablet Commonly known as: COREG Take 3.125 mg by mouth 2 (two) times daily with a meal.   cholecalciferol 1000 units tablet Commonly known as: VITAMIN D Take 5,000 Units by mouth daily.   losartan 25 MG tablet Commonly known as: COZAAR Take 25 mg by mouth daily after breakfast.   multivitamin with minerals Tabs tablet Take 1 tablet by mouth  daily.   nitroGLYCERIN 0.4 MG SL tablet Commonly known as: NITROSTAT Place 1 tablet (0.4 mg total) under the tongue every 5 (five) minutes x 3 doses as needed for chest pain.   SYSTANE OP Place 1 drop into both eyes at bedtime.   Testosterone 20.25 MG/1.25GM (1.62%) Gel Commonly known as: AndroGel Place 20.25 mg onto the skin daily.   white petrolatum Gel Commonly known as: VASELINE Apply 1 application topically as needed for dry skin (itching).         OBJECTIVE:   PHYSICAL EXAM: VS: BP 100/70   Pulse 76   Ht 6\' 1"  (1.854 m)   Wt 258 lb 4 oz (117.1 kg)   SpO2 97%   BMI 34.07 kg/m    EXAM: General: Pt appears well and is in NAD  Lungs: Clear with good BS bilat with no rales, rhonchi, or wheezes  Heart: Auscultation: RRR.  Abdomen: Normoactive bowel sounds, soft, nontender, without masses or organomegaly palpable  Extremities:  BL LE: No pretibial edema normal ROM and strength.  Mental Status: Judgment, insight: Intact Orientation: Oriented to time, place, and person Mood and affect: No depression, anxiety, or agitation     DATA REVIEWED:  Results for Carl [name] (MRN [id number]) as of 02/13/2020 15:50  Ref. Range 02/12/2020 11:41  Sodium Latest Ref Range: 135 - 145 mEq/L 138  Potassium Latest Ref Range: 3.5 - 5.1 mEq/L 4.3  Chloride Latest Ref Range: 96 - 112 mEq/L 102  CO2 Latest Ref Range: 19 - 32 mEq/L 29  Glucose Latest Ref Range: 70 - 99 mg/dL 100 (H)  BUN Latest Ref Range: 6 - 23 mg/dL 19  Creatinine Latest Ref Range: 0.40 - 1.50 mg/dL 1.18  Calcium Latest Ref Range: 8.4 - 10.5 mg/dL 8.8  Alkaline Phosphatase Latest Ref Range: 39 - 117 U/L 50  Albumin Latest Ref Range: 3.5 - 5.2 g/dL 4.3  AST Latest Ref Range: 0 - 37 U/L 28  ALT Latest Ref Range: 0 - 53 U/L 30  Total Protein Latest Ref Range: 6.0 - 8.3 g/dL 6.9  Total Bilirubin Latest Ref Range: 0.2 - 1.2 mg/dL 1.2  GFR Latest Ref Range: >60.00 mL/min 57.74 (L)  WBC Latest Ref Range: 4.0 -  10.5 K/uL 5.3  RBC Latest Ref Range: 4.22 - 5.81 Mil/uL 4.64  Hemoglobin Latest Ref Range: 13.0 - 17.0 g/dL 14.8  HCT Latest Ref Range: 39.0 - 52.0 % 44.2  MCV Latest Ref Range: 78.0 - 100.0 fl 95.3  MCHC Latest Ref Range: 30.0 - 36.0 g/dL 33.6  RDW Latest Ref Range: 11.5 - 15.5 % 12.7  Platelets Latest Ref Range: 150.0 - 400.0 K/uL 135.0 Repeated and verified X2. (L)  Testosterone Latest Ref Range: 300.00 - 890.00 ng/dL 272.69 (L)  PSA Latest Ref Range: 0.10 - 4.00 ng/mL 2.15     ASSESSMENT / PLAN / RECOMMENDATIONS:    1.  Hypogonadism:  - He is tolerating testosterone gel without side effects - Repeat total testosterone slightly low, he initially started with 1 pump a day and subsequently increased to 2 pumps a day, will continue current dose and make adjustments if needed on next visit if his level's don't improve . Testosterone goal for him given advanced age is low normal level.  - otherwise his H/H, PSA and BMP  is normal   Medication  Testosterone gel 1 pump to each shoulder ( 2 pumps daily )      F/U in 4 months        Signed electronically by: Lyndle Herrlich, MD  [facility] End[facility]  [facility] [address] [place] [place], [place] [address] Phone: [phone] FAX: [phone]      CC: Daisy Floro, MD [address] [place] [address] Phone: [phone]  Fax: [phone]   Return to Endocrinology clinic as below: Future Appointments  Date Time Provider Department Center  02/15/2020  2:00 PM [facility] None  02/15/2020  2:40 PM Johney Maine, MD [facility] None  06/12/2020 11:10 AM Kaedin Hicklin, [name], MD [facility] None  10/11/2020  7:30 AM Kionna Brier, [name], MD [facility] None

## 2020-02-13 ENCOUNTER — Encounter: Payer: Self-pay | Admitting: Internal Medicine

## 2020-02-15 ENCOUNTER — Inpatient Hospital Stay (HOSPITAL_BASED_OUTPATIENT_CLINIC_OR_DEPARTMENT_OTHER): Payer: Medicare PPO | Admitting: Hematology

## 2020-02-15 ENCOUNTER — Telehealth: Payer: Self-pay | Admitting: Hematology

## 2020-02-15 ENCOUNTER — Inpatient Hospital Stay: Payer: Medicare PPO | Attending: Hematology

## 2020-02-15 ENCOUNTER — Other Ambulatory Visit: Payer: Self-pay

## 2020-02-15 VITALS — BP 139/72 | HR 61 | Temp 98.0°F | Resp 15 | Ht 73.0 in | Wt 259.7 lb

## 2020-02-15 DIAGNOSIS — C8339 Diffuse large B-cell lymphoma, extranodal and solid organ sites: Secondary | ICD-10-CM

## 2020-02-15 DIAGNOSIS — I700 Atherosclerosis of aorta: Secondary | ICD-10-CM | POA: Insufficient documentation

## 2020-02-15 DIAGNOSIS — Z79899 Other long term (current) drug therapy: Secondary | ICD-10-CM | POA: Insufficient documentation

## 2020-02-15 DIAGNOSIS — Z5111 Encounter for antineoplastic chemotherapy: Secondary | ICD-10-CM

## 2020-02-15 DIAGNOSIS — R5383 Other fatigue: Secondary | ICD-10-CM | POA: Insufficient documentation

## 2020-02-15 DIAGNOSIS — N5089 Other specified disorders of the male genital organs: Secondary | ICD-10-CM | POA: Diagnosis not present

## 2020-02-15 LAB — CBC WITH DIFFERENTIAL/PLATELET
Abs Immature Granulocytes: 0.01 10*3/uL (ref 0.00–0.07)
Basophils Absolute: 0.1 10*3/uL (ref 0.0–0.1)
Basophils Relative: 1 %
Eosinophils Absolute: 0.2 10*3/uL (ref 0.0–0.5)
Eosinophils Relative: 4 %
HCT: 43.1 % (ref 39.0–52.0)
Hemoglobin: 14.3 g/dL (ref 13.0–17.0)
Immature Granulocytes: 0 %
Lymphocytes Relative: 32 %
Lymphs Abs: 1.4 10*3/uL (ref 0.7–4.0)
MCH: 31.9 pg (ref 26.0–34.0)
MCHC: 33.2 g/dL (ref 30.0–36.0)
MCV: 96.2 fL (ref 80.0–100.0)
Monocytes Absolute: 0.6 10*3/uL (ref 0.1–1.0)
Monocytes Relative: 14 %
Neutro Abs: 2.3 10*3/uL (ref 1.7–7.7)
Neutrophils Relative %: 49 %
Platelets: 157 10*3/uL (ref 150–400)
RBC: 4.48 MIL/uL (ref 4.22–5.81)
RDW: 12.1 % (ref 11.5–15.5)
WBC: 4.5 10*3/uL (ref 4.0–10.5)
nRBC: 0 % (ref 0.0–0.2)

## 2020-02-15 LAB — CMP (CANCER CENTER ONLY)
ALT: 27 U/L (ref 0–44)
AST: 25 U/L (ref 15–41)
Albumin: 3.8 g/dL (ref 3.5–5.0)
Alkaline Phosphatase: 52 U/L (ref 38–126)
Anion gap: 7 (ref 5–15)
BUN: 15 mg/dL (ref 8–23)
CO2: 26 mmol/L (ref 22–32)
Calcium: 9.1 mg/dL (ref 8.9–10.3)
Chloride: 106 mmol/L (ref 98–111)
Creatinine: 1.44 mg/dL — ABNORMAL HIGH (ref 0.61–1.24)
GFR, Estimated: 49 mL/min — ABNORMAL LOW (ref >60)
Glucose, Bld: 87 mg/dL (ref 70–99)
Potassium: 4.4 mmol/L (ref 3.5–5.1)
Sodium: 139 mmol/L (ref 135–145)
Total Bilirubin: 1.5 mg/dL — ABNORMAL HIGH (ref 0.3–1.2)
Total Protein: 7.2 g/dL (ref 6.5–8.1)

## 2020-02-15 LAB — LACTATE DEHYDROGENASE: LDH: 175 U/L (ref 98–192)

## 2020-02-15 NOTE — Telephone Encounter (Signed)
Scheduled follow-up appointment per 1/6 los. Patient is aware. °

## 2020-02-15 NOTE — Progress Notes (Signed)
HEMATOLOGY/ONCOLOGY CLINIC NOTE  Date of Service: 02/15/2020  Patient Care Team: Daisy Floro, MD as PCP - General (Family Medicine) [facility], [name], MD as Consulting Physician (Endocrinology)  CHIEF COMPLAINTS/PURPOSE OF CONSULTATION:  Primary testicular diffuse Large B-Cell Lymphoma   HISTORY OF PRESENTING ILLNESS:   Carl [name] is a wonderful 80 y.o. male who has been referred to [place] by Dr. Daisy Floro for evaluation and management of Diffuse Large B-Cell Lymphoma. The pt reports that he is doing well overall.  The pt notes that he first began feeling differently about 4-5 months ago, when he noticed left testicular swelling. He notes that the his testicle was "a little bit larger than a golf ball," and that this was not painful. The pt notes that he has not had any abdominal distension or abdominal pain. The pt denies any fevers, chills, night sweats, or unexpected weight loss. He also denies any leg swelling. The pt denies any trauma sustained by the testicles. He denies noticing any other related symptoms.   The pt reports that he believed he had food poisoning prior to this, as he had lots of diarrhea. He notes that he has observed a small knot on the back of his right wrist, which has not changed over the last year.  The pt has two stents in his heart which were placed in 2016. The pt notes that his blood pressure has been stable and well-controlled. He notes that his last ECHO was 6 months ago, and he has not had any CP or SOB since that time. He takes 81mg  aspirin daily. He also takes Atorvastatin. The pt denies feeling limited in his walking by SOB or CP. He notes that he could walk 2 miles before ceasing to walk, noting that feeling tired in his muscles would limit him.   The pt has recently pursued surgery on his throat with ENT Dr. Christia Reading for altered voice and hoarseness related to his vocal cords.   The pt notes that he currently lives  by himself, and is not married. He has a daughter who is 80 years old. He notes that his daughter "tries to help out." He notes that he does not have a power of attorney, but that this would be his daughter.   Of note prior to the patient's visit today, pt has had a Left Testis Surgical biopsy completed on 01/05/18 with results revealing Diffuse Large B-Cell Lymphoma.   The pt also had an US Scrotum on 12/08/17 which revealed Abnormal appearance of the left testicle which is larger than the right and diffusely heterogeneous in echotexture. Cannot exclude infiltrating process/mass. Appearance is concerning for possible neoplasm.  Most recent lab results (11/26/17) of BMP revealed all values WNL except for Glucose at 104, Creatinine at 1.30, Calcium at 8.7, GFR at 59 11/26/17 Hemoglobin value at 13.6.   On review of systems, pt reports stable energy levels, left testicular swelling, and denies fevers, chills, night sweats, unexpected weight loss, CP, SOB, back pains, flank pains, abdominal pains, leg swelling, testicular pain, and any other symptoms.   On PMHx the pt reports CAD, two heart stents placed in 2016. On Social Hx the pt denies smoking.  On Family Hx the pt reports mother with lymphoma of the breast. Two sisters with breast cancer, one at age 39.   INTERVAL HISTORY:   Carl [name] returns today for management and evaluation of his Primary testicular Diffuse Large B-Cell Lymphoma. The patient's last  visit with [place] was on 10/18/2019. The pt reports that he is doing well overall.  The pt reports that he started receiving Testosterone therapy under the supervision of Dr. Kelton Pillar at the beginning of November. The pt does not note any change in the way he feels since starting the medication. Pt tries to combat his mild fatigue with regular activity. He is walking, on average, three days per week. Pt is hydrating well, drinking at least 64 oz of water per day. Pt denies any issues  with his prostate or completely emptying his bladder. He has received his COVID19 booster.   Lab results today (02/15/20) of CBC w/diff and CMP is as follows: all values are WNL except for Creatinine at 1.44, Total Bilirubin at 1.5, GFR Est at 49. 02/15/2020 LDH at 175  On review of systems, pt reports fatigue and denies fevers, chills, night sweats, low appetite, unexpected weight loss, leg swelling, new lumps/bumps, headaches, incomplete bladder emptying and any other symptoms.    MEDICAL HISTORY:  Past Medical History:  Diagnosis Date  . Arthritis   . Coronary artery disease   . HOH (hard of hearing)   . Hyperlipidemia   . Hypertension   . Ischemic cardiomyopathy   . Myocardial infarction ([place]) 2016  . Quadriceps tendon rupture    LEFT    SURGICAL HISTORY: Past Surgical History:  Procedure Laterality Date  . BIOPSY  04/27/2018   Procedure: BIOPSY;  Surgeon: Ronnette Juniper, MD;  Location: [facility];  Service: Gastroenterology;;  . CARDIAC CATHETERIZATION N/A 10/15/2014   Procedure: Left Heart Cath and Coronary Angiography;  Surgeon: Charolette Forward, MD;  Location: [place] [facility];  Service: Cardiovascular;  Laterality: N/A;  . COLONOSCOPY    . ESOPHAGOGASTRODUODENOSCOPY (EGD) WITH PROPOFOL N/A 04/27/2018   Procedure: ESOPHAGOGASTRODUODENOSCOPY (EGD) WITH PROPOFOL;  Surgeon: Ronnette Juniper, MD;  Location: [facility];  Service: Gastroenterology;  Laterality: N/A;  . IR IMAGING GUIDED PORT INSERTION  03/14/2018  . MICROLARYNGOSCOPY WITH CO2 LASER AND EXCISION OF VOCAL CORD LESION N/A 11/26/2017   Procedure: MICROLARYNGOSCOPY WITH CO2 LASER AND EXCISION OF VOCAL CORD LESION WITH JET VENTILATION;  Surgeon: Melida Quitter, MD;  Location: [facility];  Service: ENT;  Laterality: N/A;  . QUADRICEPS TENDON REPAIR Left 02/20/2014   Procedure: LEFT REPAIR QUADRICEP TENDON;  Surgeon: Mauri Pole, MD;  Location: [facility];  Service: Orthopedics;  Laterality: Left;  . THROAT SURGERY  2010    SOCIAL  HISTORY: Social History   Socioeconomic History  . Marital status: Divorced    Spouse name: Not on file  . Number of children: Not on file  . Years of education: Not on file  . Highest education level: Not on file  Occupational History  . Not on file  Tobacco Use  . Smoking status: Never Smoker  . Smokeless tobacco: Never Used  Vaping Use  . Vaping Use: Never used  Substance and Sexual Activity  . Alcohol use: Not Currently  . Drug use: No  . Sexual activity: Not on file  Other Topics Concern  . Not on file  Social History Narrative  . Not on file   Social Determinants of Health   Financial Resource Strain: Not on file  Food Insecurity: Not on file  Transportation Needs: Not on file  Physical Activity: Not on file  Stress: Not on file  Social Connections: Not on file  Intimate Partner Violence: Not on file    FAMILY HISTORY: No family history on file.  ALLERGIES:  has No Known Allergies.  MEDICATIONS:  Current Outpatient Medications  Medication Sig Dispense Refill  . aspirin EC 81 MG tablet Take 81 mg by mouth daily.    Marland Kitchen atorvastatin (LIPITOR) 40 MG tablet Take 1 tablet (40 mg total) by mouth daily at 6 PM. 60 tablet 3  . Calcium Carbonate-Vit D-Min (CALCIUM 1200 PO) Take 1 tablet by mouth daily after breakfast.    . carvedilol (COREG) 6.25 MG tablet Take 3.125 mg by mouth 2 (two) times daily with a meal.     . cholecalciferol (VITAMIN D) 1000 UNITS tablet Take 5,000 Units by mouth daily.     Marland Kitchen losartan (COZAAR) 25 MG tablet Take 25 mg by mouth daily after breakfast.     . Multiple Vitamin (MULTIVITAMIN WITH MINERALS) TABS tablet Take 1 tablet by mouth daily.    . nitroGLYCERIN (NITROSTAT) 0.4 MG SL tablet Place 1 tablet (0.4 mg total) under the tongue every 5 (five) minutes x 3 doses as needed for chest pain. 25 tablet 12  . Polyethyl Glycol-Propyl Glycol (SYSTANE OP) Place 1 drop into both eyes at bedtime.    . Testosterone (ANDROGEL) 20.25 MG/1.25GM (1.62%)  GEL Place 20.25 mg onto the skin daily. 1.25 g 5  . white petrolatum (VASELINE) GEL Apply 1 application topically as needed for dry skin (itching).     No current facility-administered medications for this visit.   Facility-Administered Medications Ordered in Other Visits  Medication Dose Route Frequency Provider Last Rate Last Admin  . influenza vaccine adjuvanted (FLUAD) injection 0.5 mL  0.5 mL Intramuscular Once Candise Che Corene Cornea, MD        REVIEW OF SYSTEMS:   A 10+ POINT REVIEW OF SYSTEMS WAS OBTAINED including neurology, dermatology, psychiatry, cardiac, respiratory, lymph, extremities, GI, GU, Musculoskeletal, constitutional, breasts, reproductive, HEENT.  All pertinent positives are noted in the HPI.  All others are negative.   PHYSICAL EXAMINATION: ECOG PERFORMANCE STATUS: 2 - Symptomatic, <50% confined to bed  There were no vitals filed for this visit. There were no vitals filed for this visit. .There is no height or weight on file to calculate BMI.   GENERAL:alert, in no acute distress and comfortable SKIN: no acute rashes, no significant lesions EYES: conjunctiva are pink and non-injected, sclera anicteric OROPHARYNX: MMM, no exudates, no oropharyngeal erythema or ulceration NECK: supple, no JVD LYMPH:  no palpable lymphadenopathy in the cervical, axillary or inguinal regions LUNGS: clear to auscultation b/l with normal respiratory effort HEART: regular rate & rhythm ABDOMEN:  normoactive bowel sounds , non tender, not distended. No palpable hepatosplenomegaly.  Extremity: no pedal edema PSYCH: alert & oriented x 3 with fluent speech NEURO: no focal motor/sensory deficits  LABORATORY DATA:  I have reviewed the data as listed  . CBC Latest Ref Rng & Units 02/12/2020 10/18/2019 07/14/2019  WBC 4.0 - 10.5 K/uL 5.3 6.2 4.8  Hemoglobin 13.0 - 17.0 g/dL 38.7 56.4 33.2  Hematocrit 39.0 - 52.0 % 44.2 40.5 42.4  Platelets 150.0 - 400.0 K/uL 135.0 Repeated and verified X2.(L)  178 162    . CMP Latest Ref Rng & Units 02/12/2020 11/01/2019 10/18/2019  Glucose 70 - 99 mg/dL 951(O) 841(Y) 606(T)  BUN 6 - 23 mg/dL 19 19 21   Creatinine 0.40 - 1.50 mg/dL 0.16 0.10  Sodium 135 - 145 mEq/L 138 140 140  Potassium 3.5 - 5.1 mEq/L 4.3 4.3 4.1  Chloride 96 - 112 mEq/L 102 107 108  CO2 19 - 32 mEq/L 29 28 25  Calcium 8.4 - 10.5 mg/dL 8.8 8.9 8.8(L)  Total Protein 6.0 - 8.3 g/dL 6.9 6.8 6.9  Total Bilirubin 0.2 - 1.2 mg/dL 1.2 1.0 1.5(H)  Alkaline Phos 39 - 117 U/L 50 45 57  AST 0 - 37 U/L 28 26 25   ALT 0 - 53 U/L 30 24 21    01/05/18 Pathology:    RADIOGRAPHIC STUDIES: I have personally reviewed the radiological images as listed and agreed with the findings in the report. No results found.  ASSESSMENT & PLAN:   80 y.o. male with  1. Primary Testicular Diffuse Large B-Cell Lymphoma, Stage 1E 11/30/16 NM Myocar Multi w/spect w/wall motion which revealed a LV EF of 44%  12/08/17 US Scrotum revealed Abnormal appearance of the left testicle which is larger than the right and diffusely heterogeneous in echotexture. Cannot exclude infiltrating process/mass. Appearance is concerning for possible neoplasm.  01/05/18 Left testes biopsy revealed Primary Testicular Diffuse Large B-Cell Lymphoma   01/25/18 Hep B, Hep C and HIV negative  01/31/18 ECHO revealed a LV EF of 51%. Left ventricle: The cavity size was normal. Wall motion was normal; there were no regional wall motion abnormalities. Atrial septum: No defect or patent foramen ovale was identified.  02/07/18 PET/CT revealed No findings for metabolically active lymphoma involving the neck, chest, abdomen/pelvis or osseous structures.  Due to admission after first intrathecal methotrexate, concern for brain toxicity given patient's age, and concern for treatment tolerance, decided to hold IT MTX. No further concerns for bleeding, nausea, or vomiting.  08/23/2018 PET scan revealing Mildly hypermetabolic mildly  enlarged AP window lymph node, stable since 02/07/2018 PET-CT. Low level metabolism associated with new mild patchy consolidation in the medial segment right middle lobe, potentially Inflammatory. New mildly hypermetabolic right hilar lymph nodes, nonspecific, potentially reactive to the right middle lobe process. Nonspecific patchy hypermetabolism within the right testis is increased. No discrete right testicular mass on the noncontrast CT Images. These findings are equivocal for active lymphoma. Suggest attention on follow-up PET-CT in 3-6 months. Aortic Atherosclerosis (ICD10-I70.0).  2.  Past Medical History:  Diagnosis Date  . Arthritis   . Coronary artery disease   . HOH (hard of hearing)   . Hyperlipidemia   . Hypertension   . Ischemic cardiomyopathy   . Myocardial infarction ([place]) 2016  . Quadriceps tendon rupture    LEFT    PLAN: -Discussed pt labwork today, 02/15/20; blood counts are nml, kidney numbers have bumped up slightly, LDH is WNL. -No lab or clinical evidence of Primary Testicular DLBCL recurrence at this time. Will continue watchful observation. -Recommended that the pt continue to eat well, drink at least 48-64 oz of water each day, and walk 20-30 minutes each day.  -Will space out f/u to q107months if labs & clinic visits remain stable. -w/f urinary retention symptoms and f/u with PCP for possible BPH issues. -Will see back in 4 months with labs   FOLLOW UP: RTC with Dr Irene Limbo with labs in 4 months   The total time spent in the appt was 20 minutes and more than 50% was on counseling and direct patient cares.  All of the patient's questions were answered with apparent satisfaction. The patient knows to call the clinic with any problems, questions or concerns.   Sullivan Lone MD [place] AAHIVMS [facility] Hematology/Oncology [facility]ician [facility]  (Office):       [phone] (Work cell):  [phone] (Fax):           [phone]  02/15/2020 7:59 AM  I,  Yevette Edwards, am acting as a scribe for Dr. Sullivan Lone.   .I have reviewed the above documentation for accuracy and completeness, and I agree with the above. Brunetta Genera MD

## 2020-02-22 DIAGNOSIS — I2510 Atherosclerotic heart disease of native coronary artery without angina pectoris: Secondary | ICD-10-CM | POA: Diagnosis not present

## 2020-02-22 DIAGNOSIS — I255 Ischemic cardiomyopathy: Secondary | ICD-10-CM | POA: Diagnosis not present

## 2020-02-22 DIAGNOSIS — I10 Essential (primary) hypertension: Secondary | ICD-10-CM | POA: Diagnosis not present

## 2020-02-22 DIAGNOSIS — E785 Hyperlipidemia, unspecified: Secondary | ICD-10-CM | POA: Diagnosis not present

## 2020-02-22 DIAGNOSIS — R7303 Prediabetes: Secondary | ICD-10-CM | POA: Diagnosis not present

## 2020-05-07 ENCOUNTER — Other Ambulatory Visit: Payer: Self-pay | Admitting: Internal Medicine

## 2020-05-08 MED ORDER — TESTOSTERONE 20.25 MG/ACT (1.62%) TD GEL: TRANSDERMAL | 0 refills | Status: DC

## 2020-05-08 NOTE — Addendum Note (Signed)
Addended by: Jacqualin Combes on: 05/08/2020 11:27 AM   Modules accepted: Orders

## 2020-05-08 NOTE — Telephone Encounter (Signed)
Faxed to [facility] pharmacy.

## 2020-05-23 DIAGNOSIS — R7303 Prediabetes: Secondary | ICD-10-CM | POA: Diagnosis not present

## 2020-05-23 DIAGNOSIS — E785 Hyperlipidemia, unspecified: Secondary | ICD-10-CM | POA: Diagnosis not present

## 2020-05-23 DIAGNOSIS — I2510 Atherosclerotic heart disease of native coronary artery without angina pectoris: Secondary | ICD-10-CM | POA: Diagnosis not present

## 2020-05-23 DIAGNOSIS — I255 Ischemic cardiomyopathy: Secondary | ICD-10-CM | POA: Diagnosis not present

## 2020-05-23 DIAGNOSIS — I10 Essential (primary) hypertension: Secondary | ICD-10-CM | POA: Diagnosis not present

## 2020-06-08 ENCOUNTER — Other Ambulatory Visit: Payer: Self-pay | Admitting: Internal Medicine

## 2020-06-10 MED ORDER — TESTOSTERONE 20.25 MG/ACT (1.62%) TD GEL: TRANSDERMAL | 0 refills | Status: DC

## 2020-06-10 NOTE — Addendum Note (Signed)
Addended by: Jacqualin Combes on: 06/10/2020 08:43 AM   Modules accepted: Orders

## 2020-06-10 NOTE — Telephone Encounter (Signed)
Please advise. I tried to refill and it will just print.

## 2020-06-11 NOTE — Progress Notes (Signed)
Name: Carl [name]  MRN/ DOB: [id number], 12/16/1940    Age/ Sex: 80 y.o., male     PCP: [name], MD   Reason for Endocrinology Evaluation: Testicular radiotherapy     Initial Endocrinology Clinic Visit: 04/11/2019    PATIENT IDENTIFIER: Carl [name] is a 80 y.o., male with a past medical history of CAD ( S/P DES 2016) and HTN . He has followed with [facility] since 04/11/2019 for consultative assistance with management of his Testicular radiotherapy  HISTORICAL SUMMARY:  Pt was diagnosed with testicular lymphoma in 01/2018. He is S/P chemo and scrotal Radiation therapy, he completed radiation therapy in 09/2018.   He was referred here to evaluate , monitor and treat for hypogonadism should it develop following radiation exposure. Sees Dr. 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    He has been taking testosterone 1 pump to each shoulder daily  Has noted improvement in energy level  He was also noted with weight loss  Denies rash   Has noted increase   Frequency that he attributes ot increase water intake    HISTORY:  Past Medical History:  Past Medical History:  Diagnosis Date  . Arthritis   . Coronary artery disease   . HOH (hard of hearing)   . Hyperlipidemia   . Hypertension   . Ischemic cardiomyopathy   . Myocardial infarction ([place]) 2016  . Quadriceps tendon rupture    LEFT   Past Surgical History:  Past Surgical History:  Procedure Laterality Date  . BIOPSY  04/27/2018   Procedure: BIOPSY;  Surgeon: Ronnette Juniper, MD;  Location: [facility];  Service: Gastroenterology;;  . CARDIAC CATHETERIZATION N/A 10/15/2014   Procedure: Left Heart Cath and Coronary Angiography;  Surgeon: Charolette Forward,  MD;  Location: [facility];  Service: Cardiovascular;  Laterality: N/A;  . COLONOSCOPY    . ESOPHAGOGASTRODUODENOSCOPY (EGD) WITH PROPOFOL N/A 04/27/2018   Procedure: ESOPHAGOGASTRODUODENOSCOPY (EGD) WITH PROPOFOL;  Surgeon: Ronnette Juniper, MD;  Location: [facility];  Service: Gastroenterology;  Laterality: N/A;  . IR IMAGING GUIDED PORT INSERTION  03/14/2018  . MICROLARYNGOSCOPY WITH CO2 LASER AND EXCISION OF VOCAL CORD LESION N/A 11/26/2017   Procedure: MICROLARYNGOSCOPY WITH CO2 LASER AND EXCISION OF VOCAL CORD LESION WITH JET VENTILATION;  Surgeon: Melida Quitter, MD;  Location: [facility];  Service: ENT;  Laterality: N/A;  . QUADRICEPS TENDON REPAIR Left 02/20/2014   Procedure: LEFT REPAIR QUADRICEP TENDON;  Surgeon: Mauri Pole, MD;  [facility]: [facility];  Service: Orthopedics;  Laterality: Left;  . THROAT SURGERY  2010    Social History:  reports that he has never smoked. He has never used smokeless tobacco. He reports previous alcohol use. He reports that he does not use drugs.  Family History: No family history on file.   HOME MEDICATIONS: Allergies as of 06/12/2020   No Known Allergies     Medication List       Accurate as of Jun 12, 2020 11:24 AM. If you have any questions, ask your nurse or doctor.        aspirin EC 81 MG tablet Take 81 mg by mouth daily.   atorvastatin 40 MG tablet Commonly known as: LIPITOR Take 1 tablet (40 mg total)  by mouth daily at 6 PM.   CALCIUM 1200 PO Take 1 tablet by mouth daily after breakfast.   carvedilol 6.25 MG tablet Commonly known as: COREG Take 3.125 mg by mouth 2 (two) times daily with a meal.   cholecalciferol 1000 units tablet Commonly known as: VITAMIN D Take 5,000 Units by mouth daily.   losartan 25 MG tablet Commonly known as: COZAAR Take 25 mg by mouth daily after breakfast.   multivitamin with minerals Tabs tablet Take 1 tablet by mouth daily.   nitroGLYCERIN 0.4 MG SL tablet Commonly known as: NITROSTAT Place 1  tablet (0.4 mg total) under the tongue every 5 (five) minutes x 3 doses as needed for chest pain.   SYSTANE OP Place 1 drop into both eyes at bedtime.   Testosterone 20.25 MG/ACT (1.62%) Gel APPLY 1 PUMP ONTO THE SKIN ONCE DAILY   white petrolatum Gel Commonly known as: VASELINE Apply 1 application topically as needed for dry skin (itching).         OBJECTIVE:   PHYSICAL EXAM: VS: BP 116/70   Pulse 65   Ht 6\' 1"  (1.854 m)   Wt 251 lb 8 oz (114.1 kg)   SpO2 98%   BMI 33.18 kg/m    EXAM: General: Pt appears well and is in NAD  Lungs: Clear with good BS bilat with no rales, rhonchi, or wheezes  Heart: Auscultation: RRR.  Abdomen: Normoactive bowel sounds, soft, nontender, without masses or organomegaly palpable  Extremities:  BL LE: No pretibial edema normal ROM and strength.  Mental Status: Judgment, insight: Intact Orientation: Oriented to time, place, and person Mood and affect: No depression, anxiety, or agitation     DATA REVIEWED:  Results for Carl [name], Carl [name] (MRN [id number]) as of 06/13/2020 13:34  Ref. Range 06/12/2020 11:32  Sodium Latest Ref Range: 135 - 145 mEq/L 137  Potassium Latest Ref Range: 3.5 - 5.1 mEq/L 4.1  Chloride Latest Ref Range: 96 - 112 mEq/L 104  CO2 Latest Ref Range: 19 - 32 mEq/L 27  Glucose Latest Ref Range: 70 - 99 mg/dL 110 (H)  BUN Latest Ref Range: 6 - 23 mg/dL 19  Creatinine Latest Ref Range: 0.40 - 1.50 mg/dL 1.12  Calcium Latest Ref Range: 8.4 - 10.5 mg/dL 8.8  Alkaline Phosphatase Latest Ref Range: 39 - 117 U/L 52  Albumin Latest Ref Range: 3.5 - 5.2 g/dL 4.0  AST Latest Ref Range: 0 - 37 U/L 29  ALT Latest Ref Range: 0 - 53 U/L 30  Total Protein Latest Ref Range: 6.0 - 8.3 g/dL 6.6  Total Bilirubin Latest Ref Range: 0.2 - 1.2 mg/dL 2.2 (H)  GFR Latest Ref Range: >60.00 mL/min 61.32  WBC Latest Ref Range: 4.0 - 10.5 K/uL 4.7  RBC Latest Ref Range: 4.22 - 5.81 Mil/uL 4.56  Hemoglobin Latest Ref Range: 13.0 - 17.0 g/dL  14.8  HCT Latest Ref Range: 39.0 - 52.0 % 44.1  MCV Latest Ref Range: 78.0 - 100.0 fl 96.7  MCHC Latest Ref Range: 30.0 - 36.0 g/dL 33.6  RDW Latest Ref Range: 11.5 - 15.5 % 13.2  Platelets Latest Ref Range: 150.0 - 400.0 K/uL 123.0 (L)  Testosterone Latest Ref Range: 300.00 - 890.00 ng/dL 653.58  PSA Latest Ref Range: 0.10 - 4.00 ng/mL 2.63     ASSESSMENT / PLAN / RECOMMENDATIONS:    1. Hypogonadism:  - He is tolerating testosterone gel without side effects - Testosterone goal for him given advanced age is low normal  level.  -Repeat testosterone levels today show upper normal levels, I would suggest reducing testosterone dose -Rest of his labs show normal H/H, PSA and BMP    Medication  Decrease testosterone gel 1 pump to 1 shoulder daily ( 1 pump daily )      F/U in 4 months    Addendum: Attempted to call the patient on 06/13/2020 at 1330 left a message for call back    Signed electronically by: Mack Guise, MD  [facility] Endocrinology  [facility] [address] [place], [place] [address] Phone: [phone] FAX: [phone]      CC: [name], [place] [address] Phone: [phone]  Fax: [phone]   Return to Endocrinology clinic as below: Future Appointments  Date Time Provider [place]  06/17/2020  1:30 PM [facility] None  06/17/2020  2:00 PM Brunetta Genera, MD [facility] None  10/11/2020  7:30 AM Terrance Lanahan, [name], MD [facility] None

## 2020-06-12 ENCOUNTER — Ambulatory Visit: Payer: Medicare PPO | Admitting: Internal Medicine

## 2020-06-12 ENCOUNTER — Encounter: Payer: Self-pay | Admitting: Internal Medicine

## 2020-06-12 ENCOUNTER — Other Ambulatory Visit: Payer: Self-pay

## 2020-06-12 VITALS — BP 116/70 | HR 65 | Ht 73.0 in | Wt 251.5 lb

## 2020-06-12 DIAGNOSIS — E291 Testicular hypofunction: Secondary | ICD-10-CM | POA: Diagnosis not present

## 2020-06-12 LAB — COMPREHENSIVE METABOLIC PANEL
ALT: 30 U/L (ref 0–53)
AST: 29 U/L (ref 0–37)
Albumin: 4 g/dL (ref 3.5–5.2)
Alkaline Phosphatase: 52 U/L (ref 39–117)
BUN: 19 mg/dL (ref 6–23)
CO2: 27 mEq/L (ref 19–32)
Calcium: 8.8 mg/dL (ref 8.4–10.5)
Chloride: 104 mEq/L (ref 96–112)
Creatinine, Ser: 1.12 mg/dL (ref 0.40–1.50)
GFR: 61.32 mL/min (ref >60.00)
Glucose, Bld: 110 mg/dL — ABNORMAL HIGH (ref 70–99)
Potassium: 4.1 mEq/L (ref 3.5–5.1)
Sodium: 137 mEq/L (ref 135–145)
Total Bilirubin: 2.2 mg/dL — ABNORMAL HIGH (ref 0.2–1.2)
Total Protein: 6.6 g/dL (ref 6.0–8.3)

## 2020-06-12 LAB — CBC
HCT: 44.1 % (ref 39.0–52.0)
Hemoglobin: 14.8 g/dL (ref 13.0–17.0)
MCHC: 33.6 g/dL (ref 30.0–36.0)
MCV: 96.7 fl (ref 78.0–100.0)
Platelets: 123 10*3/uL — ABNORMAL LOW (ref 150.0–400.0)
RBC: 4.56 Mil/uL (ref 4.22–5.81)
RDW: 13.2 % (ref 11.5–15.5)
WBC: 4.7 10*3/uL (ref 4.0–10.5)

## 2020-06-12 LAB — PSA: PSA: 2.63 ng/mL (ref 0.10–4.00)

## 2020-06-12 LAB — TESTOSTERONE: Testosterone: 653.58 ng/dL (ref 300.00–890.00)

## 2020-06-12 NOTE — Patient Instructions (Signed)
-   Continue Testosterone 1 pump to each shoulder daily

## 2020-06-13 ENCOUNTER — Ambulatory Visit: Payer: Medicare PPO | Admitting: Hematology

## 2020-06-13 ENCOUNTER — Telehealth: Payer: Self-pay | Admitting: Internal Medicine

## 2020-06-13 ENCOUNTER — Encounter: Payer: Self-pay | Admitting: Internal Medicine

## 2020-06-13 ENCOUNTER — Other Ambulatory Visit: Payer: Medicare PPO

## 2020-06-13 NOTE — Telephone Encounter (Signed)
Left a message for a call back to discuss the need to reduce Testosterone from 1 pump to each shoulder to 1 pump to ONE shoulder daily     Awaiting a call back   [place], MD  [facility] Endocrinology  [facility] [address] [place],  [address] Phone: [phone] FAX: [phone]

## 2020-06-14 NOTE — Telephone Encounter (Signed)
Patient returned your call.  His call back # is [phone]. He did advise that a detailed message can be left

## 2020-06-14 NOTE — Telephone Encounter (Signed)
Spoke to the pt on 06/14/2020 at 1515 and discussed reducing testosterone to 1 pump daily    Pt expressed understanding    Abby Nena Jordan, MD  [facility] Endocrinology  [facility] [address] [place], [place] [address] Phone: [phone] FAX: [phone]

## 2020-06-15 NOTE — Progress Notes (Signed)
HEMATOLOGY/ONCOLOGY CLINIC NOTE  Date of Service: 06/17/2020  Patient Care Team: [name], MD as PCP - General (Family Medicine) [facility], [name], MD as Consulting Physician (Endocrinology)  CHIEF COMPLAINTS/PURPOSE OF CONSULTATION:  Primary testicular diffuse Large B-Cell Lymphoma   HISTORY OF PRESENTING ILLNESS:   Carl [name] is a wonderful 80 y.o. male who has been referred to [place] by Dr. [name] for evaluation and management of Diffuse Large B-Cell Lymphoma. The pt reports that he is doing well overall.  The pt notes that he first began feeling differently about 4-5 months ago, when he noticed left testicular swelling. He notes that the his testicle was "a little bit larger than a golf ball," and that this was not painful. The pt notes that he has not had any abdominal distension or abdominal pain. The pt denies any fevers, chills, night sweats, or unexpected weight loss. He also denies any leg swelling. The pt denies any trauma sustained by the testicles. He denies noticing any other related symptoms.   The pt reports that he believed he had food poisoning prior to this, as he had lots of diarrhea. He notes that he has observed a small knot on the back of his right wrist, which has not changed over the last year.  The pt has two stents in his heart which were placed in 2016. The pt notes that his blood pressure has been stable and well-controlled. He notes that his last ECHO was 6 months ago, and he has not had any CP or SOB since that time. He takes 81mg  aspirin daily. He also takes Atorvastatin. The pt denies feeling limited in his walking by SOB or CP. He notes that he could walk 2 miles before ceasing to walk, noting that feeling tired in his muscles would limit him.   The pt has recently pursued surgery on his throat with ENT Dr. Melida Quitter for altered voice and hoarseness related to his vocal cords.   The pt notes that he currently lives  by himself, and is not married. He has a daughter who is 52 years old. He notes that his daughter "tries to help out." He notes that he does not have a power of attorney, but that this would be his daughter.   Of note prior to the patient's visit today, pt has had a Left Testis Surgical biopsy completed on 01/05/18 with results revealing Diffuse Large B-Cell Lymphoma.   The pt also had an US Scrotum on 12/08/17 which revealed Abnormal appearance of the left testicle which is larger than the right and diffusely heterogeneous in echotexture. Cannot exclude infiltrating process/mass. Appearance is concerning for possible neoplasm.  Most recent lab results (11/26/17) of BMP revealed all values WNL except for Glucose at 104, Creatinine at 1.30, Calcium at 8.7, GFR at 59 11/26/17 Hemoglobin value at 13.6.   On review of systems, pt reports stable energy levels, left testicular swelling, and denies fevers, chills, night sweats, unexpected weight loss, CP, SOB, back pains, flank pains, abdominal pains, leg swelling, testicular pain, and any other symptoms.   On PMHx the pt reports CAD, two heart stents placed in 2016. On Social Hx the pt denies smoking.  On Family Hx the pt reports mother with lymphoma of the breast. Two sisters with breast cancer, one at age 1.   INTERVAL HISTORY:   Carl [name] returns today for management and evaluation of his Primary testicular Diffuse Large B-Cell Lymphoma. The patient's last  visit with [place] was on 02/15/2020. The pt reports that he is doing well overall.  The pt reports that he has been eating and sleeping well. The pt notes no new symptoms or concerns. He notes no medication changes. The pt notes he has been walking recently.  Lab results today 06/17/2020 of CBC w/diff and CMP is as follows: all values are WNL. CMP pending. 06/17/2020 LDH 162  On review of systems, pt denies headaches, testicular pain/swelling, leg swelling, abdominal pain, changes in  breathing, and any other symptoms.  MEDICAL HISTORY:  Past Medical History:  Diagnosis Date  . Arthritis   . Coronary artery disease   . HOH (hard of hearing)   . Hyperlipidemia   . Hypertension   . Ischemic cardiomyopathy   . Myocardial infarction ([place]) 2016  . Quadriceps tendon rupture    LEFT    SURGICAL HISTORY: Past Surgical History:  Procedure Laterality Date  . BIOPSY  04/27/2018   Procedure: BIOPSY;  Surgeon: Ronnette Juniper, MD;  Location: [facility];  Service: Gastroenterology;;  . CARDIAC CATHETERIZATION N/A 10/15/2014   Procedure: Left Heart Cath and Coronary Angiography;  Surgeon: Charolette Forward, MD;  Location: [facility];  Service: Cardiovascular;  Laterality: N/A;  . COLONOSCOPY    . ESOPHAGOGASTRODUODENOSCOPY (EGD) WITH PROPOFOL N/A 04/27/2018   Procedure: ESOPHAGOGASTRODUODENOSCOPY (EGD) WITH PROPOFOL;  Surgeon: Ronnette Juniper, MD;  [facility]: [facility];  Service: Gastroenterology;  Laterality: N/A;  . IR IMAGING GUIDED PORT INSERTION  03/14/2018  . MICROLARYNGOSCOPY WITH CO2 LASER AND EXCISION OF VOCAL CORD LESION N/A 11/26/2017   Procedure: MICROLARYNGOSCOPY WITH CO2 LASER AND EXCISION OF VOCAL CORD LESION WITH JET VENTILATION;  Surgeon: Melida Quitter, MD;  Location: [facility];  Service: ENT;  Laterality: N/A;  . QUADRICEPS TENDON REPAIR Left 02/20/2014   Procedure: LEFT REPAIR QUADRICEP TENDON;  Surgeon: Mauri Pole, MD;  [facility];  Service: Orthopedics;  Laterality: Left;  . THROAT SURGERY  2010    SOCIAL HISTORY: Social History   Socioeconomic History  . Marital status: Divorced    Spouse name: Not on file  . Number of children: Not on file  . Years of education: Not on file  . Highest education level: Not on file  Occupational History  . Not on file  Tobacco Use  . Smoking status: Never Smoker  . Smokeless tobacco: Never Used  Vaping Use  . Vaping Use: Never used  Substance and Sexual Activity  . Alcohol use: Not Currently  . Drug use: No   . Sexual activity: Not on file  Other Topics Concern  . Not on file  Social History Narrative  . Not on file   Social Determinants of Health   Financial Resource Strain: Not on file  Food Insecurity: Not on file  Transportation Needs: Not on file  Physical Activity: Not on file  Stress: Not on file  Social Connections: Not on file  Intimate Partner Violence: Not on file    FAMILY HISTORY: No family history on file.  ALLERGIES:  has No Known Allergies.  MEDICATIONS:  Current Outpatient Medications  Medication Sig Dispense Refill  . aspirin EC 81 MG tablet Take 81 mg by mouth daily.    Marland Kitchen atorvastatin (LIPITOR) 40 MG tablet Take 1 tablet (40 mg total) by mouth daily at 6 PM. 60 tablet 3  . Calcium Carbonate-Vit D-Min (CALCIUM 1200 PO) Take 1 tablet by mouth daily after breakfast.    . carvedilol (COREG) 6.25 MG tablet Take  3.125 mg by mouth 2 (two) times daily with a meal.     . cholecalciferol (VITAMIN D) 1000 UNITS tablet Take 5,000 Units by mouth daily.     Marland Kitchen losartan (COZAAR) 25 MG tablet Take 25 mg by mouth daily after breakfast.     . Multiple Vitamin (MULTIVITAMIN WITH MINERALS) TABS tablet Take 1 tablet by mouth daily.    . nitroGLYCERIN (NITROSTAT) 0.4 MG SL tablet Place 1 tablet (0.4 mg total) under the tongue every 5 (five) minutes x 3 doses as needed for chest pain. 25 tablet 12  . Polyethyl Glycol-Propyl Glycol (SYSTANE OP) Place 1 drop into both eyes at bedtime.    . Testosterone 20.25 MG/ACT (1.62%) GEL APPLY 1 PUMP ONTO THE SKIN ONCE DAILY 75 g 0  . white petrolatum (VASELINE) GEL Apply 1 application topically as needed for dry skin (itching).     No current facility-administered medications for this visit.   Facility-Administered Medications Ordered in Other Visits  Medication Dose Route Frequency Provider Last Rate Last Admin  . influenza vaccine adjuvanted (FLUAD) injection 0.5 mL  0.5 mL Intramuscular Once Brunetta Genera, MD        REVIEW OF  SYSTEMS:   10 Point review of Systems was done is negative except as noted above.  PHYSICAL EXAMINATION: ECOG PERFORMANCE STATUS: 2 - Symptomatic, <50% confined to bed  Vitals:   06/17/20 1340  BP: 114/74  Pulse: (!) 56  Resp: 17  Temp: 98 F (36.7 C)  SpO2: 97%   Filed Weights   06/17/20 1340  Weight: 253 lb 14.4 oz (115.2 kg)   .Body mass index is 33.5 kg/m.   GENERAL:alert, in no acute distress and comfortable SKIN: no acute rashes, no significant lesions EYES: conjunctiva are pink and non-injected, sclera anicteric OROPHARYNX: MMM, no exudates, no oropharyngeal erythema or ulceration NECK: supple, no JVD LYMPH:  no palpable lymphadenopathy in the cervical, axillary or inguinal regions LUNGS: clear to auscultation b/l with normal respiratory effort HEART: regular rate & rhythm ABDOMEN:  normoactive bowel sounds , non tender, not distended. No palpable hepatosplenomegaly.  Extremity: no pedal edema PSYCH: alert & oriented x 3 with fluent speech NEURO: no focal motor/sensory deficits  LABORATORY DATA:  I have reviewed the data as listed  . CBC Latest Ref Rng & Units 06/17/2020 06/12/2020 02/15/2020  WBC 4.0 - 10.5 K/uL 5.2 4.7 4.5  Hemoglobin 13.0 - 17.0 g/dL 15.0 14.8 14.3  Hematocrit 39.0 - 52.0 % 46.1 44.1 43.1  Platelets 150 - 400 K/uL 158 123.0(L) 157    . CMP Latest Ref Rng & Units 06/17/2020 06/12/2020 02/15/2020  Glucose 70 - 99 mg/dL 97 110(H) 87  BUN 8 - 23 mg/dL 14 19 15   Creatinine 0.61 - 1.24 mg/dL 1.22 1.12 1.44(H)  Sodium 135 - 145 mmol/L 142 137 139  Potassium 3.5 - 5.1 mmol/L 4.2 4.1 4.4  Chloride 98 - 111 mmol/L 106 104 106  CO2 22 - 32 mmol/L 27 27 26   Calcium 8.9 - 10.3 mg/dL 9.0 8.8 9.1  Total Protein 6.5 - 8.1 g/dL 6.9 6.6 7.2  Total Bilirubin 0.3 - 1.2 mg/dL 1.7(H) 2.2(H) 1.5(H)  Alkaline Phos 38 - 126 U/L 56 52 52  AST 15 - 41 U/L 24 29 25   ALT 0 - 44 U/L 28 30 27    01/05/18 Pathology:    RADIOGRAPHIC STUDIES: I have personally reviewed  the radiological images as listed and agreed with the findings in the report. No results found.  ASSESSMENT & PLAN:   80 y.o. male with  1. Primary Testicular Diffuse Large B-Cell Lymphoma, Stage 1E 11/30/16 NM Myocar Multi w/spect w/wall motion which revealed a LV EF of 44%  12/08/17 US Scrotum revealed Abnormal appearance of the left testicle which is larger than the right and diffusely heterogeneous in echotexture. Cannot exclude infiltrating process/mass. Appearance is concerning for possible neoplasm.  01/05/18 Left testes biopsy revealed Primary Testicular Diffuse Large B-Cell Lymphoma   01/25/18 Hep B, Hep C and HIV negative  01/31/18 ECHO revealed a LV EF of 51%. Left ventricle: The cavity size was normal. Wall motion was normal; there were no regional wall motion abnormalities. Atrial septum: No defect or patent foramen ovale was identified.  02/07/18 PET/CT revealed No findings for metabolically active lymphoma involving the neck, chest, abdomen/pelvis or osseous structures.  Due to admission after first intrathecal methotrexate, concern for brain toxicity given patient's age, and concern for treatment tolerance, decided to hold IT MTX. No further concerns for bleeding, nausea, or vomiting.  08/23/2018 PET scan revealing Mildly hypermetabolic mildly enlarged AP window lymph node, stable since 02/07/2018 PET-CT. Low level metabolism associated with new mild patchy consolidation in the medial segment right middle lobe, potentially Inflammatory. New mildly hypermetabolic right hilar lymph nodes, nonspecific, potentially reactive to the right middle lobe process. Nonspecific patchy hypermetabolism within the right testis is increased. No discrete right testicular mass on the noncontrast CT Images. These findings are equivocal for active lymphoma. Suggest attention on follow-up PET-CT in 3-6 months. Aortic Atherosclerosis (ICD10-I70.0).  2.  Past Medical History:  Diagnosis Date  .  Arthritis   . Coronary artery disease   . HOH (hard of hearing)   . Hyperlipidemia   . Hypertension   . Ischemic cardiomyopathy   . Myocardial infarction ([place]) 2016  . Quadriceps tendon rupture    LEFT    PLAN: -Discussed pt labwork today, 06/17/2020; blood counts all completely normal, other labs pending. -Advised pt he is now over two years out from treatment. The risk of lymphoma progression now drops to less than 5%. -Advised pt we will move appointments now to every six months unless new symptoms. -Recommended pt receive the second COVID booster shot as recently approved. The pt has already received this. -Discussed Evusheld and pt's eligibility. Will send referral. -No lab or clinical evidence of Primary Testicular DLBCL recurrence at this time. Will continue watchful observation. -Recommended that the pt continue to eat well, drink at least 48-64 oz of water each day, and walk 20-30 minutes each day.  -Will see back in 6 months with labs.   FOLLOW UP: Referral for Evusheld RTC with Dr Irene Limbo with labs in 6 months   The total time spent in the appt was 20 minutes and more than 50% was on counseling and direct patient cares.  All of the patient's questions were answered with apparent satisfaction. The patient knows to call the clinic with any problems, questions or concerns.   Sullivan Lone MD [place] AAHIVMS [facility] Hematology/Oncology Physician [facility]  (Office):       [phone] (Work cell):  [phone] (Fax):           [phone]  06/17/2020 2:09 PM  I, Reinaldo Raddle, am acting as scribe for Dr. Sullivan Lone, MD.

## 2020-06-17 ENCOUNTER — Other Ambulatory Visit: Payer: Self-pay

## 2020-06-17 ENCOUNTER — Inpatient Hospital Stay: Payer: Medicare PPO | Attending: Hematology | Admitting: Hematology

## 2020-06-17 ENCOUNTER — Inpatient Hospital Stay: Payer: Medicare PPO

## 2020-06-17 VITALS — BP 114/74 | HR 56 | Temp 98.0°F | Resp 17 | Ht 73.0 in | Wt 253.9 lb

## 2020-06-17 DIAGNOSIS — C8339 Diffuse large B-cell lymphoma, extranodal and solid organ sites: Secondary | ICD-10-CM

## 2020-06-17 DIAGNOSIS — Z79899 Other long term (current) drug therapy: Secondary | ICD-10-CM | POA: Insufficient documentation

## 2020-06-17 DIAGNOSIS — I700 Atherosclerosis of aorta: Secondary | ICD-10-CM | POA: Diagnosis not present

## 2020-06-17 DIAGNOSIS — Z5111 Encounter for antineoplastic chemotherapy: Secondary | ICD-10-CM

## 2020-06-17 LAB — CBC WITH DIFFERENTIAL/PLATELET
Abs Immature Granulocytes: 0.02 10*3/uL (ref 0.00–0.07)
Basophils Absolute: 0 10*3/uL (ref 0.0–0.1)
Basophils Relative: 1 %
Eosinophils Absolute: 0.2 10*3/uL (ref 0.0–0.5)
Eosinophils Relative: 4 %
HCT: 46.1 % (ref 39.0–52.0)
Hemoglobin: 15 g/dL (ref 13.0–17.0)
Immature Granulocytes: 0 %
Lymphocytes Relative: 24 %
Lymphs Abs: 1.2 10*3/uL (ref 0.7–4.0)
MCH: 32.1 pg (ref 26.0–34.0)
MCHC: 32.5 g/dL (ref 30.0–36.0)
MCV: 98.5 fL (ref 80.0–100.0)
Monocytes Absolute: 0.7 10*3/uL (ref 0.1–1.0)
Monocytes Relative: 13 %
Neutro Abs: 3 10*3/uL (ref 1.7–7.7)
Neutrophils Relative %: 58 %
Platelets: 158 10*3/uL (ref 150–400)
RBC: 4.68 MIL/uL (ref 4.22–5.81)
RDW: 12.4 % (ref 11.5–15.5)
WBC: 5.2 10*3/uL (ref 4.0–10.5)
nRBC: 0 % (ref 0.0–0.2)

## 2020-06-17 LAB — CMP (CANCER CENTER ONLY)
ALT: 28 U/L (ref 0–44)
AST: 24 U/L (ref 15–41)
Albumin: 3.8 g/dL (ref 3.5–5.0)
Alkaline Phosphatase: 56 U/L (ref 38–126)
Anion gap: 9 (ref 5–15)
BUN: 14 mg/dL (ref 8–23)
CO2: 27 mmol/L (ref 22–32)
Calcium: 9 mg/dL (ref 8.9–10.3)
Chloride: 106 mmol/L (ref 98–111)
Creatinine: 1.22 mg/dL (ref 0.61–1.24)
GFR, Estimated: 59 mL/min — ABNORMAL LOW (ref >60)
Glucose, Bld: 97 mg/dL (ref 70–99)
Potassium: 4.2 mmol/L (ref 3.5–5.1)
Sodium: 142 mmol/L (ref 135–145)
Total Bilirubin: 1.7 mg/dL — ABNORMAL HIGH (ref 0.3–1.2)
Total Protein: 6.9 g/dL (ref 6.5–8.1)

## 2020-06-17 LAB — LACTATE DEHYDROGENASE: LDH: 162 U/L (ref 98–192)

## 2020-06-24 ENCOUNTER — Other Ambulatory Visit: Payer: Self-pay | Admitting: Adult Health

## 2020-06-24 NOTE — Progress Notes (Signed)
I called patient to discuss Evusheld, a long acting monoclonal antibody injection administered to patients with decreased immune systems or intolerance/allergy to the COVID 19 vaccine as COVID19 prevention.    Unable to reach patient.  LMOM to return my call.  With him being 2 years out from his lymphoma treatment, it is unclear what other indicators there are for him receiving the injection.  Will await his call back.  Wilber Bihari, NP

## 2020-07-13 ENCOUNTER — Other Ambulatory Visit: Payer: Self-pay | Admitting: Internal Medicine

## 2020-07-15 DIAGNOSIS — H43823 Vitreomacular adhesion, bilateral: Secondary | ICD-10-CM | POA: Diagnosis not present

## 2020-07-15 DIAGNOSIS — H40013 Open angle with borderline findings, low risk, bilateral: Secondary | ICD-10-CM | POA: Diagnosis not present

## 2020-07-15 DIAGNOSIS — H25013 Cortical age-related cataract, bilateral: Secondary | ICD-10-CM | POA: Diagnosis not present

## 2020-07-15 DIAGNOSIS — H2513 Age-related nuclear cataract, bilateral: Secondary | ICD-10-CM | POA: Diagnosis not present

## 2020-08-22 DIAGNOSIS — I255 Ischemic cardiomyopathy: Secondary | ICD-10-CM | POA: Diagnosis not present

## 2020-08-22 DIAGNOSIS — I10 Essential (primary) hypertension: Secondary | ICD-10-CM | POA: Diagnosis not present

## 2020-08-22 DIAGNOSIS — I2510 Atherosclerotic heart disease of native coronary artery without angina pectoris: Secondary | ICD-10-CM | POA: Diagnosis not present

## 2020-08-22 DIAGNOSIS — R7303 Prediabetes: Secondary | ICD-10-CM | POA: Diagnosis not present

## 2020-08-22 DIAGNOSIS — E785 Hyperlipidemia, unspecified: Secondary | ICD-10-CM | POA: Diagnosis not present

## 2020-08-29 ENCOUNTER — Other Ambulatory Visit: Payer: Self-pay | Admitting: Internal Medicine

## 2020-08-30 NOTE — Telephone Encounter (Signed)
Is this okay to refill? 

## 2020-09-05 ENCOUNTER — Other Ambulatory Visit: Payer: Self-pay

## 2020-09-05 MED ORDER — TESTOSTERONE 20.25 MG/ACT (1.62%) TD GEL: TRANSDERMAL | 0 refills | Status: DC

## 2020-09-05 NOTE — Telephone Encounter (Signed)
I don't think it printed

## 2020-09-11 ENCOUNTER — Other Ambulatory Visit: Payer: Self-pay | Admitting: Internal Medicine

## 2020-09-11 MED ORDER — TESTOSTERONE 20.25 MG/ACT (1.62%) TD GEL: TRANSDERMAL | 5 refills | Status: DC

## 2020-10-11 ENCOUNTER — Ambulatory Visit: Payer: Medicare HMO | Admitting: Internal Medicine

## 2020-10-17 DIAGNOSIS — E291 Testicular hypofunction: Secondary | ICD-10-CM | POA: Diagnosis not present

## 2020-10-17 DIAGNOSIS — E669 Obesity, unspecified: Secondary | ICD-10-CM | POA: Diagnosis not present

## 2020-10-17 DIAGNOSIS — K5900 Constipation, unspecified: Secondary | ICD-10-CM | POA: Diagnosis not present

## 2020-10-17 DIAGNOSIS — E785 Hyperlipidemia, unspecified: Secondary | ICD-10-CM | POA: Diagnosis not present

## 2020-10-17 DIAGNOSIS — I10 Essential (primary) hypertension: Secondary | ICD-10-CM | POA: Diagnosis not present

## 2020-10-17 DIAGNOSIS — N529 Male erectile dysfunction, unspecified: Secondary | ICD-10-CM | POA: Diagnosis not present

## 2020-10-17 DIAGNOSIS — I255 Ischemic cardiomyopathy: Secondary | ICD-10-CM | POA: Diagnosis not present

## 2020-10-17 DIAGNOSIS — C8590 Non-Hodgkin lymphoma, unspecified, unspecified site: Secondary | ICD-10-CM | POA: Diagnosis not present

## 2020-10-17 DIAGNOSIS — I25119 Atherosclerotic heart disease of native coronary artery with unspecified angina pectoris: Secondary | ICD-10-CM | POA: Diagnosis not present

## 2020-11-21 DIAGNOSIS — Z23 Encounter for immunization: Secondary | ICD-10-CM | POA: Diagnosis not present

## 2020-11-21 DIAGNOSIS — I10 Essential (primary) hypertension: Secondary | ICD-10-CM | POA: Diagnosis not present

## 2020-11-21 DIAGNOSIS — E785 Hyperlipidemia, unspecified: Secondary | ICD-10-CM | POA: Diagnosis not present

## 2020-11-21 DIAGNOSIS — I2510 Atherosclerotic heart disease of native coronary artery without angina pectoris: Secondary | ICD-10-CM | POA: Diagnosis not present

## 2020-12-18 ENCOUNTER — Ambulatory Visit: Payer: Medicare PPO | Admitting: Internal Medicine

## 2020-12-18 ENCOUNTER — Encounter: Payer: Self-pay | Admitting: Internal Medicine

## 2020-12-18 ENCOUNTER — Other Ambulatory Visit: Payer: Self-pay

## 2020-12-18 ENCOUNTER — Inpatient Hospital Stay: Payer: Medicare PPO | Attending: Hematology | Admitting: Hematology

## 2020-12-18 VITALS — BP 113/68 | HR 75 | Temp 98.1°F | Resp 17 | Ht 73.0 in | Wt 242.3 lb

## 2020-12-18 VITALS — BP 120/74 | HR 68 | Ht 73.0 in | Wt 240.0 lb

## 2020-12-18 DIAGNOSIS — E291 Testicular hypofunction: Secondary | ICD-10-CM

## 2020-12-18 DIAGNOSIS — C8339 Diffuse large B-cell lymphoma, extranodal and solid organ sites: Secondary | ICD-10-CM | POA: Diagnosis not present

## 2020-12-18 DIAGNOSIS — T66XXXS Radiation sickness, unspecified, sequela: Secondary | ICD-10-CM | POA: Diagnosis not present

## 2020-12-18 LAB — COMPREHENSIVE METABOLIC PANEL
ALT: 22 U/L (ref 0–53)
AST: 24 U/L (ref 0–37)
Albumin: 4 g/dL (ref 3.5–5.2)
Alkaline Phosphatase: 58 U/L (ref 39–117)
BUN: 14 mg/dL (ref 6–23)
CO2: 28 mEq/L (ref 19–32)
Calcium: 8.8 mg/dL (ref 8.4–10.5)
Chloride: 106 mEq/L (ref 96–112)
Creatinine, Ser: 1.17 mg/dL (ref 0.40–1.50)
GFR: 57.98 mL/min — ABNORMAL LOW (ref >60.00)
Glucose, Bld: 90 mg/dL (ref 70–99)
Potassium: 4 mEq/L (ref 3.5–5.1)
Sodium: 140 mEq/L (ref 135–145)
Total Bilirubin: 1.7 mg/dL — ABNORMAL HIGH (ref 0.2–1.2)
Total Protein: 6.8 g/dL (ref 6.0–8.3)

## 2020-12-18 LAB — CBC
HCT: 43.5 % (ref 39.0–52.0)
Hemoglobin: 14.5 g/dL (ref 13.0–17.0)
MCHC: 33.2 g/dL (ref 30.0–36.0)
MCV: 96.7 fl (ref 78.0–100.0)
Platelets: 150 10*3/uL (ref 150.0–400.0)
RBC: 4.51 Mil/uL (ref 4.22–5.81)
RDW: 12.9 % (ref 11.5–15.5)
WBC: 4.3 10*3/uL (ref 4.0–10.5)

## 2020-12-18 LAB — TESTOSTERONE: Testosterone: 175.75 ng/dL — ABNORMAL LOW (ref 300.00–890.00)

## 2020-12-18 LAB — PSA: PSA: 2.64 ng/mL (ref 0.10–4.00)

## 2020-12-18 NOTE — Progress Notes (Signed)
Name: Carl [name]  MRN/ DOB: [id number], 1940/04/13    Age/ Sex: 80 y.o., male     PCP: [name], MD   Reason for Endocrinology Evaluation: Testicular radiotherapy/ Hypogonadism      Initial Endocrinology Clinic Visit: 04/11/2019    PATIENT IDENTIFIER: Mr. Carl [name] is a 80 y.o., male with a past medical history of CAD ( S/P DES 2016) and HTN . He has followed with [facility] since 04/11/2019 for consultative assistance with management of his Testicular radiotherapy  HISTORICAL SUMMARY:  Pt was diagnosed with testicular lymphoma in 01/2018. He is S/P chemo and scrotal Radiation therapy, he completed radiation therapy in 09/2018.    He was referred here to evaluate , monitor and treat for hypogonadism should it develop following radiation exposure.  Sees Dr. 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    He has been taking testosterone 1 pump to one shoulder daily   He continues to loose weight  Denies skin  rash  Urinary frequency stable  Has been a bit more tired     HISTORY:  Past Medical History:  Past Medical History:  Diagnosis Date   Arthritis    Coronary artery disease    HOH (hard of hearing)    Hyperlipidemia    Hypertension    Ischemic cardiomyopathy    Myocardial infarction ([place]) 2016   Quadriceps tendon rupture    LEFT   Past Surgical History:  Past Surgical History:  Procedure Laterality Date   BIOPSY  04/27/2018   Procedure: BIOPSY;  Surgeon: Ronnette Juniper, MD;  Location: [facility];  Service: Gastroenterology;;   CARDIAC CATHETERIZATION N/A 10/15/2014   Procedure: Left Heart Cath and Coronary Angiography;  Surgeon: Charolette Forward, MD;  Location: [facility];  Service:  Cardiovascular;  Laterality: N/A;   COLONOSCOPY     ESOPHAGOGASTRODUODENOSCOPY (EGD) WITH PROPOFOL N/A 04/27/2018   Procedure: ESOPHAGOGASTRODUODENOSCOPY (EGD) WITH PROPOFOL;  Surgeon: Ronnette Juniper, MD;  Location: [facility];  Service: Gastroenterology;  Laterality: N/A;   IR IMAGING GUIDED PORT INSERTION  03/14/2018   MICROLARYNGOSCOPY WITH CO2 LASER AND EXCISION OF VOCAL CORD LESION N/A 11/26/2017   Procedure: MICROLARYNGOSCOPY WITH CO2 LASER AND EXCISION OF VOCAL CORD LESION WITH JET VENTILATION;  Surgeon: Melida Quitter, MD;  Location: [facility];  Service: ENT;  Laterality: N/A;   QUADRICEPS TENDON REPAIR Left 02/20/2014   Procedure: LEFT REPAIR QUADRICEP TENDON;  Surgeon: Mauri Pole, MD;  Location: [facility];  Service: Orthopedics;  Laterality: Left;   THROAT SURGERY  2010   Social History:  reports that he has never smoked. He has never used smokeless tobacco. He reports that he does not currently use alcohol. He reports that he does not use drugs. Family History: No family history on file.   HOME MEDICATIONS: Allergies as of 12/18/2020   No Known Allergies      Medication List        Accurate as of December 18, 2020 11:14 AM. If you have any questions, ask your nurse or doctor.          aspirin EC 81 MG tablet Take 81 mg by mouth daily.   atorvastatin 40 MG tablet Commonly known as: LIPITOR Take 1 tablet (40 mg  total) by mouth daily at 6 PM.   CALCIUM 1200 PO Take 1 tablet by mouth daily after breakfast.   carvedilol 6.25 MG tablet Commonly known as: COREG Take 3.125 mg by mouth 2 (two) times daily with a meal.   cholecalciferol 1000 units tablet Commonly known as: VITAMIN D Take 5,000 Units by mouth daily.   losartan 25 MG tablet Commonly known as: COZAAR Take 25 mg by mouth daily after breakfast.   multivitamin with minerals Tabs tablet Take 1 tablet by mouth daily.   nitroGLYCERIN 0.4 MG SL tablet Commonly known as: NITROSTAT Place 1 tablet (0.4 mg total)  under the tongue every 5 (five) minutes x 3 doses as needed for chest pain.   SYSTANE OP Place 1 drop into both eyes at bedtime.   Testosterone 20.25 MG/ACT (1.62%) Gel APPLY 1 PUMP ONTO THE SKIN ONCE DAILY   white petrolatum Gel Commonly known as: VASELINE Apply 1 application topically as needed for dry skin (itching).          OBJECTIVE:   PHYSICAL EXAM: VS: BP 120/74 (BP Location: Left Arm, Patient Position: Sitting, Cuff Size: Small)   Pulse 68   Ht 6\' 1"  (1.854 m)   Wt 240 lb (108.9 kg)   SpO2 99%   BMI 31.66 kg/m    EXAM: General: Pt appears well and is in NAD  Lungs: Clear with good BS bilat with no rales, rhonchi, or wheezes  Heart: Auscultation: RRR.  Abdomen: Normoactive bowel sounds, soft, nontender, without masses or organomegaly palpable  Extremities:  BL LE: No pretibial edema normal ROM and strength.  Mental Status: Judgment, insight: Intact Orientation: Oriented to time, place, and person Mood and affect: No depression, anxiety, or agitation     DATA REVIEWED:  Results for ONDRA, DEBOARD (MRN [id number]) as of 12/19/2020 09:11  Ref. Range 12/18/2020 11:26  Sodium Latest Ref Range: 135 - 145 mEq/L 140  Potassium Latest Ref Range: 3.5 - 5.1 mEq/L 4.0  Chloride Latest Ref Range: 96 - 112 mEq/L 106  CO2 Latest Ref Range: 19 - 32 mEq/L 28  Glucose Latest Ref Range: 70 - 99 mg/dL 90  BUN Latest Ref Range: 6 - 23 mg/dL 14  Creatinine Latest Ref Range: 0.40 - 1.50 mg/dL 1.17  Calcium Latest Ref Range: 8.4 - 10.5 mg/dL 8.8  Alkaline Phosphatase Latest Ref Range: 39 - 117 U/L 58  Albumin Latest Ref Range: 3.5 - 5.2 g/dL 4.0  AST Latest Ref Range: 0 - 37 U/L 24  ALT Latest Ref Range: 0 - 53 U/L 22  Total Protein Latest Ref Range: 6.0 - 8.3 g/dL 6.8  Total Bilirubin Latest Ref Range: 0.2 - 1.2 mg/dL 1.7 (H)  GFR Latest Ref Range: >60.00 mL/min 57.98 (L)  WBC Latest Ref Range: 4.0 - 10.5 K/uL 4.3  RBC Latest Ref Range: 4.22 - 5.81 Mil/uL 4.51   Hemoglobin Latest Ref Range: 13.0 - 17.0 g/dL 14.5  HCT Latest Ref Range: 39.0 - 52.0 % 43.5  MCV Latest Ref Range: 78.0 - 100.0 fl 96.7  MCHC Latest Ref Range: 30.0 - 36.0 g/dL 33.2  RDW Latest Ref Range: 11.5 - 15.5 % 12.9  Platelets Latest Ref Range: 150.0 - 400.0 K/uL 150.0  Testosterone Latest Ref Range: 300.00 - 890.00 ng/dL 175.75 (L)  PSA Latest Ref Range: 0.10 - 4.00 ng/mL 2.64    ASSESSMENT / PLAN / RECOMMENDATIONS:    1. Hypogonadism:  - He is tolerating testosterone gel without side effects - Testosterone  goal for him given advanced age is low normal level. When he was on the 2 pumps a day his, testosterone was elevated at 653 ng/dL , we reduced it to 1 pump a day and currently at 175.75 , no changes at this time  -Rest of his labs show normal H/H, PSA and BMP    Medication  Continue testosterone gel 1 pump to 1 shoulder daily ( 1 pump daily )      F/U in 6 months      Signed electronically by: Mack Guise, MD  [facility]  [facility] [address] [place], [place] [address] Phone: [phone] FAX: [phone]      CC: [name], [place] [address] Phone: [phone]  Fax: [phone]   Return to Endocrinology clinic as below: Future Appointments  Date Time Provider [place]  12/18/2020  2:00 PM Brunetta Genera, MD [facility] None

## 2020-12-19 ENCOUNTER — Telehealth: Payer: Self-pay | Admitting: Internal Medicine

## 2020-12-19 NOTE — Telephone Encounter (Signed)
Second attempt to contact the patient, received no answer

## 2020-12-19 NOTE — Telephone Encounter (Signed)
Please let the pt know that his Testosterone is acceptable at this time, its lower then it has been but I'd rather it a bit low then high     Continue Testosterone one pump , to one shoulder daily     The rest of  his labs such calcium, potassium , sodium and hemoglobin look normal    Thanks  Gillespie, MD  [facility]  [facility] [address] [place], [place] [address] Phone: [phone] FAX: [phone]

## 2020-12-19 NOTE — Telephone Encounter (Signed)
Attempted to contact the patient to inform him of his Testosterone levels as well as a few other labs. LVM for a call back when convenient.

## 2020-12-27 NOTE — Addendum Note (Signed)
Addended by: Sullivan Lone on: 12/27/2020 04:50 PM   Modules accepted: Orders

## 2020-12-27 NOTE — Progress Notes (Signed)
HEMATOLOGY/ONCOLOGY CLINIC NOTE  Date of Service: .12/18/2020    Patient Care Team: [name], MD as PCP - General (Family Medicine) [facility], [name], MD as Consulting Physician (Endocrinology)  CHIEF COMPLAINTS/PURPOSE OF CONSULTATION:  Primary testicular diffuse Large B-Cell Lymphoma   HISTORY OF PRESENTING ILLNESS:   Carl [name] is a wonderful 80 y.o. male who has been referred to [place] by Dr. [name] for evaluation and management of Diffuse Large B-Cell Lymphoma. The pt reports that he is doing well overall.  The pt notes that he first began feeling differently about 4-5 months ago, when he noticed left testicular swelling. He notes that the his testicle was "a little bit larger than a golf ball," and that this was not painful. The pt notes that he has not had any abdominal distension or abdominal pain. The pt denies any fevers, chills, night sweats, or unexpected weight loss. He also denies any leg swelling. The pt denies any trauma sustained by the testicles. He denies noticing any other related symptoms.   The pt reports that he believed he had food poisoning prior to this, as he had lots of diarrhea. He notes that he has observed a small knot on the back of his right wrist, which has not changed over the last year.  The pt has two stents in his heart which were placed in 2016. The pt notes that his blood pressure has been stable and well-controlled. He notes that his last ECHO was 6 months ago, and he has not had any CP or SOB since that time. He takes 81mg  aspirin daily. He also takes Atorvastatin. The pt denies feeling limited in his walking by SOB or CP. He notes that he could walk 2 miles before ceasing to walk, noting that feeling tired in his muscles would limit him.   The pt has recently pursued surgery on his throat with ENT Dr. Melida Quitter for altered voice and hoarseness related to his vocal cords.   The pt notes that he currently  lives by himself, and is not married. He has a daughter who is 62 years old. He notes that his daughter "tries to help out." He notes that he does not have a power of attorney, but that this would be his daughter.   Of note prior to the patient's visit today, pt has had a Left Testis Surgical biopsy completed on 01/05/18 with results revealing Diffuse Large B-Cell Lymphoma.   The pt also had an US Scrotum on 12/08/17 which revealed Abnormal appearance of the left testicle which is larger than the right and diffusely heterogeneous in echotexture. Cannot exclude infiltrating process/mass. Appearance is concerning for possible neoplasm.  Most recent lab results (11/26/17) of BMP revealed all values WNL except for Glucose at 104, Creatinine at 1.30, Calcium at 8.7, GFR at 59 11/26/17 Hemoglobin value at 13.6.   On review of systems, pt reports stable energy levels, left testicular swelling, and denies fevers, chills, night sweats, unexpected weight loss, CP, SOB, back pains, flank pains, abdominal pains, leg swelling, testicular pain, and any other symptoms.   On PMHx the pt reports CAD, two heart stents placed in 2016. On Social Hx the pt denies smoking.  On Family Hx the pt reports mother with lymphoma of the breast. Two sisters with breast cancer, one at age 52.   INTERVAL HISTORY:   Carl [name] returns today for management and evaluation of his Primary testicular Diffuse Large B-Cell Lymphoma. The  patient's last visit with [place] was on 06/17/2020.  He notes no acute medical medical concerns over the last 6 months.  He notes mild chronic fatigue which he attributes to his age.  No fevers no chills no night sweats.  No testicular pain or swelling.  Normal abdominal pain or distention.  No chest pain or shortness of breath. He has not noted any new lumps or bumps.  Lab results today 12/18/2020 of CBC w/diff is within normal limits  CMP unremarkable except mildly elevated bilirubin of  1.7   On review of systems, pt denies headaches, testicular pain/swelling, leg swelling, abdominal pain, changes in breathing, and any other symptoms.  MEDICAL HISTORY:  Past Medical History:  Diagnosis Date   Arthritis    Coronary artery disease    HOH (hard of hearing)    Hyperlipidemia    Hypertension    Ischemic cardiomyopathy    Myocardial infarction ([place]) 2016   Quadriceps tendon rupture    LEFT    SURGICAL HISTORY: Past Surgical History:  Procedure Laterality Date   BIOPSY  04/27/2018   Procedure: BIOPSY;  Surgeon: Ronnette Juniper, MD;  Location: [facility];  Service: Gastroenterology;;   CARDIAC CATHETERIZATION N/A 10/15/2014   Procedure: Left Heart Cath and Coronary Angiography;  Surgeon: Charolette Forward, MD;  Location: [facility];  Service: Cardiovascular;  Laterality: N/A;   COLONOSCOPY     ESOPHAGOGASTRODUODENOSCOPY (EGD) WITH PROPOFOL N/A 04/27/2018   Procedure: ESOPHAGOGASTRODUODENOSCOPY (EGD) WITH PROPOFOL;  Surgeon: Ronnette Juniper, MD;  [facility];  Service: Gastroenterology;  Laterality: N/A;   IR IMAGING GUIDED PORT INSERTION  03/14/2018   MICROLARYNGOSCOPY WITH CO2 LASER AND EXCISION OF VOCAL CORD LESION N/A 11/26/2017   Procedure: MICROLARYNGOSCOPY WITH CO2 LASER AND EXCISION OF VOCAL CORD LESION WITH JET VENTILATION;  Surgeon: Melida Quitter, MD;  Location: [facility];  Service: ENT;  Laterality: N/A;   QUADRICEPS TENDON REPAIR Left 02/20/2014   Procedure: LEFT REPAIR QUADRICEP TENDON;  Surgeon: Mauri Pole, MD;  [facility]: [facility];  Service: Orthopedics;  Laterality: Left;   THROAT SURGERY  2010    SOCIAL HISTORY: Social History   Socioeconomic History   Marital status: Divorced    Spouse name: Not on file   Number of children: Not on file   Years of education: Not on file   Highest education level: Not on file  Occupational History   Not on file  Tobacco Use   Smoking status: Never   Smokeless tobacco: Never  Vaping Use   Vaping Use: Never  used  Substance and Sexual Activity   Alcohol use: Not Currently   Drug use: No   Sexual activity: Not on file  Other Topics Concern   Not on file  Social History Narrative   Not on file   Social Determinants of Health   Financial Resource Strain: Not on file  Food Insecurity: Not on file  Transportation Needs: Not on file  Physical Activity: Not on file  Stress: Not on file  Social Connections: Not on file  Intimate Partner Violence: Not on file    FAMILY HISTORY: No family history on file.  ALLERGIES:  has No Known Allergies.  MEDICATIONS:  Current Outpatient Medications  Medication Sig Dispense Refill   aspirin EC 81 MG tablet Take 81 mg by mouth daily.     atorvastatin (LIPITOR) 40 MG tablet Take 1 tablet (40 mg total) by mouth daily at 6 PM. 60 tablet 3   Calcium Carbonate-Vit D-Min (CALCIUM  1200 PO) Take 1 tablet by mouth daily after breakfast.     carvedilol (COREG) 6.25 MG tablet Take 3.125 mg by mouth 2 (two) times daily with a meal.      cholecalciferol (VITAMIN D) 1000 UNITS tablet Take 5,000 Units by mouth daily.      losartan (COZAAR) 25 MG tablet Take 25 mg by mouth daily after breakfast.      Multiple Vitamin (MULTIVITAMIN WITH MINERALS) TABS tablet Take 1 tablet by mouth daily.     nitroGLYCERIN (NITROSTAT) 0.4 MG SL tablet Place 1 tablet (0.4 mg total) under the tongue every 5 (five) minutes x 3 doses as needed for chest pain. 25 tablet 12   Polyethyl Glycol-Propyl Glycol (SYSTANE OP) Place 1 drop into both eyes at bedtime.     Testosterone 20.25 MG/ACT (1.62%) GEL APPLY 1 PUMP ONTO THE SKIN ONCE DAILY 75 g 5   white petrolatum (VASELINE) GEL Apply 1 application topically as needed for dry skin (itching).     No current facility-administered medications for this visit.   Facility-Administered Medications Ordered in Other Visits  Medication Dose Route Frequency Provider Last Rate Last Admin   influenza vaccine adjuvanted (FLUAD) injection 0.5 mL  0.5 mL  Intramuscular Once Brunetta Genera, MD        REVIEW OF SYSTEMS:   .10 Point review of Systems was done is negative except as noted above.  PHYSICAL EXAMINATION: ECOG PERFORMANCE STATUS: 2 - Symptomatic, <50% confined to bed  Vitals:   12/18/20 1401  BP: 113/68  Pulse: 75  Resp: 17  Temp: 98.1 F (36.7 C)  SpO2: 96%   Filed Weights   12/18/20 1401  Weight: 242 lb 4.8 oz (109.9 kg)   .Body mass index is 31.97 kg/m.   .10 Point review of Systems was done is negative except as noted above.   LABORATORY DATA:  I have reviewed the data as listed  . CBC Latest Ref Rng & Units 12/18/2020 06/17/2020 06/12/2020  WBC 4.0 - 10.5 K/uL 4.3 5.2 4.7  Hemoglobin 13.0 - 17.0 g/dL 14.5 15.0 14.8  Hematocrit 39.0 - 52.0 % 43.5 46.1 44.1  Platelets 150.0 - 400.0 K/uL 150.0 158 123.0(L)    . CMP Latest Ref Rng & Units 12/18/2020 06/17/2020 06/12/2020  Glucose 70 - 99 mg/dL 90 97 110(H)  BUN 6 - 23 mg/dL 14 14 19   Creatinine 0.40 - 1.50 mg/dL 1.17 1.22 1.12  Sodium 135 - 145 mEq/L 140 142 137  Potassium 3.5 - 5.1 mEq/L 4.0 4.2 4.1  Chloride 96 - 112 mEq/L 106 106 104  CO2 19 - 32 mEq/L 28 27 27   Calcium 8.4 - 10.5 mg/dL 8.8 9.0 8.8  Total Protein 6.0 - 8.3 g/dL 6.8 6.9 6.6  Total Bilirubin 0.2 - 1.2 mg/dL 1.7(H) 1.7(H) 2.2(H)  Alkaline Phos 39 - 117 U/L 58 56 52  AST 0 - 37 U/L 24 24 29   ALT 0 - 53 U/L 22 28 30    Component     Latest Ref Rng & Units 12/18/2020  Testosterone     300.00 - 890.00 ng/dL 175.75 (L)  PSA     0.10 - 4.00 ng/mL 2.64   01/05/18 Pathology:    RADIOGRAPHIC STUDIES: I have personally reviewed the radiological images as listed and agreed with the findings in the report. No results found.  ASSESSMENT & PLAN:   80 y.o. male with  1. Primary Testicular Diffuse Large B-Cell Lymphoma, Stage 1E 11/30/16 NM Myocar Multi w/spect  w/wall motion which revealed a LV EF of 44%  12/08/17 US Scrotum revealed Abnormal appearance of the left testicle which is  larger than the right and diffusely heterogeneous in echotexture. Cannot exclude infiltrating process/mass. Appearance is concerning for possible neoplasm.  01/05/18 Left testes biopsy revealed Primary Testicular Diffuse Large B-Cell Lymphoma   01/25/18 Hep B, Hep C and HIV negative  01/31/18 ECHO revealed a LV EF of 51%. Left ventricle: The cavity size was normal. Wall motion was normal; there were no regional wall motion abnormalities. Atrial septum: No defect or patent foramen ovale was identified.  02/07/18 PET/CT revealed No findings for metabolically active lymphoma involving the neck, chest, abdomen/pelvis or osseous structures.  Due to admission after first intrathecal methotrexate, concern for brain toxicity given patient's age, and concern for treatment tolerance, decided to hold IT MTX. No further concerns for bleeding, nausea, or vomiting.  08/23/2018 PET scan revealing Mildly hypermetabolic mildly enlarged AP window lymph node, stable since 02/07/2018 PET-CT. Low level metabolism associated with new mild patchy consolidation in the medial segment right middle lobe, potentially Inflammatory. New mildly hypermetabolic right hilar lymph nodes, nonspecific, potentially reactive to the right middle lobe process. Nonspecific patchy hypermetabolism within the right testis is increased. No discrete right testicular mass on the noncontrast CT Images. These findings are equivocal for active lymphoma. Suggest attention on follow-up PET-CT in 3-6 months. Aortic Atherosclerosis (ICD10-I70.0).  2.  Past Medical History:  Diagnosis Date   Arthritis    Coronary artery disease    HOH (hard of hearing)    Hyperlipidemia    Hypertension    Ischemic cardiomyopathy    Myocardial infarction ([place]) 2016   Quadriceps tendon rupture    LEFT    PLAN: -Discussed pt labwork today, 12/18/2020; blood counts all completely normal, cmp unremarkable -No lab or clinical evidence of Primary Testicular DLBCL  recurrence at this time. Will continue watchful observation. -He is on testosterone replacement but his primary care physician and testosterone and PSA levels were drawn at the request today. -Will see back in 6 months with labs.   FOLLOW UP: RTC with Dr Irene Limbo with labs in 6 months IR for port cath removal in 1-2 weeks  The total time spent in the appt was 20 minutes and more than 50% was on counseling and direct patient cares.  All of the patient's questions were answered with apparent satisfaction. The patient knows to call the clinic with any problems, questions or concerns.   Sullivan Lone MD [place] AAHIVMS [facility] Hematology/Oncology Physician [facility]

## 2021-01-06 DIAGNOSIS — E669 Obesity, unspecified: Secondary | ICD-10-CM | POA: Diagnosis not present

## 2021-01-06 DIAGNOSIS — Z0000 Encounter for general adult medical examination without abnormal findings: Secondary | ICD-10-CM | POA: Diagnosis not present

## 2021-01-06 DIAGNOSIS — Z6832 Body mass index (BMI) 32.0-32.9, adult: Secondary | ICD-10-CM | POA: Diagnosis not present

## 2021-01-21 ENCOUNTER — Other Ambulatory Visit: Payer: Self-pay | Admitting: Radiology

## 2021-01-22 ENCOUNTER — Other Ambulatory Visit: Payer: Self-pay

## 2021-01-22 ENCOUNTER — Ambulatory Visit (HOSPITAL_COMMUNITY)
Admission: RE | Admit: 2021-01-22 | Discharge: 2021-01-22 | Disposition: A | Discharge status: Home / Self Care | Payer: Medicare PPO | Source: Ambulatory Visit | Attending: Hematology | Admitting: Hematology

## 2021-01-22 ENCOUNTER — Encounter (HOSPITAL_COMMUNITY): Payer: Self-pay

## 2021-01-22 DIAGNOSIS — I2510 Atherosclerotic heart disease of native coronary artery without angina pectoris: Secondary | ICD-10-CM | POA: Diagnosis not present

## 2021-01-22 DIAGNOSIS — I252 Old myocardial infarction: Secondary | ICD-10-CM | POA: Insufficient documentation

## 2021-01-22 DIAGNOSIS — Z452 Encounter for adjustment and management of vascular access device: Secondary | ICD-10-CM | POA: Insufficient documentation

## 2021-01-22 DIAGNOSIS — Z955 Presence of coronary angioplasty implant and graft: Secondary | ICD-10-CM | POA: Diagnosis not present

## 2021-01-22 DIAGNOSIS — E785 Hyperlipidemia, unspecified: Secondary | ICD-10-CM | POA: Insufficient documentation

## 2021-01-22 DIAGNOSIS — I255 Ischemic cardiomyopathy: Secondary | ICD-10-CM | POA: Diagnosis not present

## 2021-01-22 DIAGNOSIS — C8339 Diffuse large B-cell lymphoma, extranodal and solid organ sites: Secondary | ICD-10-CM | POA: Insufficient documentation

## 2021-01-22 DIAGNOSIS — I10 Essential (primary) hypertension: Secondary | ICD-10-CM | POA: Insufficient documentation

## 2021-01-22 HISTORY — PX: IR REMOVAL TUN ACCESS W/ PORT W/O FL MOD SED: IMG2290

## 2021-01-22 MED ORDER — MIDAZOLAM HCL 2 MG/2ML IJ SOLN
INTRAMUSCULAR | Status: AC | PRN
  Administered 2021-01-22: .5 mg via INTRAVENOUS

## 2021-01-22 MED ORDER — SODIUM CHLORIDE 0.9 % IV SOLN: INTRAVENOUS | Status: DC

## 2021-01-22 MED ORDER — FENTANYL CITRATE (PF) 100 MCG/2ML IJ SOLN
INTRAMUSCULAR | Status: AC | PRN
  Administered 2021-01-22: 25 ug via INTRAVENOUS

## 2021-01-22 MED ORDER — LIDOCAINE HCL 1 % IJ SOLN
INTRAMUSCULAR | Status: AC
  Filled 2021-01-22: qty 20

## 2021-01-22 MED ORDER — FENTANYL CITRATE (PF) 100 MCG/2ML IJ SOLN
INTRAMUSCULAR | Status: AC
  Filled 2021-01-22: qty 2

## 2021-01-22 MED ORDER — MIDAZOLAM HCL 2 MG/2ML IJ SOLN
INTRAMUSCULAR | Status: AC
  Filled 2021-01-22: qty 2

## 2021-01-22 MED ORDER — LIDOCAINE HCL (PF) 1 % IJ SOLN
INTRAMUSCULAR | Status: AC | PRN
  Administered 2021-01-22: 10 mL via INTRADERMAL

## 2021-01-22 NOTE — H&P (Signed)
Chief Complaint: Patient was seen in consultation today for port removal.  Referring Physician(s): Brunetta Genera  Supervising Physician: Aletta Edouard  Patient Status: Carl [name] - Out-pt  History of Present Illness: Carl [name] is a 80 y.o. 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 Mr. Carl [name] was diagnosed with primarily testicular large B-cell lymphoma in 2018 and underwent port placement in IR 03/14/18 (right IJ, Dr. Earleen Newport). He has been referred to IR for port removal as he is currently considered to be in remission.   Mr. Ran denies any complaints today, he is curious how the port is removed from the vein and if there will be bleeding. He is note sure when the port was used last. He understands the procedure today and is agreeable to proceed as planned.  Past Medical History:  Diagnosis Date   Arthritis    Coronary artery disease    HOH (hard of hearing)    Hyperlipidemia    Hypertension    Ischemic cardiomyopathy    Myocardial infarction ([place]) 2016   Quadriceps tendon rupture    LEFT    Past Surgical History:  Procedure Laterality Date   BIOPSY  04/27/2018   Procedure: BIOPSY;  Surgeon: Ronnette Juniper, MD;  Location: [facility];  Service: Gastroenterology;;   CARDIAC CATHETERIZATION N/A 10/15/2014   Procedure: Left Heart Cath and Coronary Angiography;  Surgeon: Charolette Forward, MD;  Location: [place] [facility];  Service: Cardiovascular;  Laterality: N/A;   COLONOSCOPY     ESOPHAGOGASTRODUODENOSCOPY (EGD) WITH PROPOFOL N/A 04/27/2018   Procedure: ESOPHAGOGASTRODUODENOSCOPY (EGD) WITH PROPOFOL;  Surgeon: Ronnette Juniper, MD;  Location: [facility];  Service: Gastroenterology;  Laterality: N/A;   IR IMAGING GUIDED PORT INSERTION  03/14/2018   MICROLARYNGOSCOPY WITH CO2 LASER AND EXCISION OF VOCAL CORD  LESION N/A 11/26/2017   Procedure: MICROLARYNGOSCOPY WITH CO2 LASER AND EXCISION OF VOCAL CORD LESION WITH JET VENTILATION;  Surgeon: Melida Quitter, MD;  Location: [place];  Service: ENT;  Laterality: N/A;   QUADRICEPS TENDON REPAIR Left 02/20/2014   Procedure: LEFT REPAIR QUADRICEP TENDON;  Surgeon: Mauri Pole, MD;  Location: [facility];  Service: Orthopedics;  Laterality: Left;   THROAT SURGERY  2010    Allergies: Patient has no known allergies.  Medications: Prior to Admission medications   Medication Sig Start Date End Date Taking? Authorizing Provider  aspirin EC 81 MG tablet Take 81 mg by mouth daily.    [provider]  atorvastatin (LIPITOR) 40 MG tablet Take 1 tablet (40 mg total) by mouth daily at 6 PM. 10/19/14   Charolette Forward, MD  Calcium Carbonate-Vit D-Min (CALCIUM 1200 PO) Take 1 tablet by mouth daily after breakfast.    [provider]  carvedilol (COREG) 6.25 MG tablet Take 3.125 mg by mouth 2 (two) times daily with a meal.     [provider]  cholecalciferol (VITAMIN D) 1000 UNITS tablet Take 5,000 Units by mouth daily.     [provider]  losartan (COZAAR) 25 MG tablet Take 25 mg by mouth daily after breakfast.     [provider]  Multiple Vitamin (MULTIVITAMIN WITH MINERALS) TABS tablet Take 1 tablet by mouth daily.    [provider]  nitroGLYCERIN (NITROSTAT) 0.4 MG SL tablet Place 1 tablet (0.4 mg total) under the tongue every 5 (five) minutes x 3 doses as needed  for chest pain. 10/19/14   Charolette Forward, MD  Polyethyl Glycol-Propyl Glycol (SYSTANE OP) Place 1 drop into both eyes at bedtime.    [provider]  Testosterone 20.25 MG/ACT (1.62%) GEL APPLY 1 PUMP ONTO THE SKIN ONCE DAILY 09/11/20   [name], [name], MD  white petrolatum (VASELINE) GEL Apply 1 application topically as needed for dry skin (itching).    [provider]     No family history on file.  Social History    Socioeconomic History   Marital status: Divorced    Spouse name: Not on file   Number of children: Not on file   Years of education: Not on file   Highest education level: Not on file  Occupational History   Not on file  Tobacco Use   Smoking status: Never   Smokeless tobacco: Never  Vaping Use   Vaping Use: Never used  Substance and Sexual Activity   Alcohol use: Not Currently   Drug use: No   Sexual activity: Not on file  Other Topics Concern   Not on file  Social History Narrative   Not on file   Social Determinants of Health   Financial Resource Strain: Not on file  Food Insecurity: Not on file  Transportation Needs: Not on file  Physical Activity: Not on file  Stress: Not on file  Social Connections: Not on file     Review of Systems: A 12 point ROS discussed and pertinent positives are indicated in the HPI above.  All other systems are negative.  Review of Systems  Constitutional:  Negative for chills and fever.  Respiratory:  Negative for cough and shortness of breath.   Cardiovascular:  Negative for chest pain.  Gastrointestinal:  Negative for abdominal pain, nausea and vomiting.  Musculoskeletal:  Negative for back pain.  Neurological:  Negative for headaches.   Vital Signs: BP 118/78    Pulse 61    Temp 97.7 F (36.5 C) (Oral)    Resp 18    SpO2 98%   Physical Exam Vitals reviewed.  Constitutional:      General: He is not in acute distress.    Comments: Hard of hearing  HENT:     Mouth/Throat:     Mouth: Mucous membranes are moist.     Pharynx: Oropharynx is clear. No oropharyngeal exudate or posterior oropharyngeal erythema.  Cardiovascular:     Rate and Rhythm: Normal rate and regular rhythm.     Comments: (+) right chest port Pulmonary:     Effort: Pulmonary effort is normal.     Breath sounds: Normal breath sounds.  Abdominal:     General: There is no distension.     Palpations: Abdomen is soft.     Tenderness: There is no abdominal  tenderness.  Skin:    General: Skin is warm and dry.  Neurological:     Mental Status: He is alert and oriented to person, place, and time.  Psychiatric:        Mood and Affect: Mood normal.        Behavior: Behavior normal.        Thought Content: Thought content normal.        Judgment: Judgment normal.         Imaging: No results found.  Labs:  CBC: Recent Labs    02/15/20 1343 06/12/20 1132 06/17/20 1322 12/18/20 1126  WBC 4.5 4.7 5.2 4.3  HGB 14.3 14.8 15.0 14.5  HCT 43.1 44.1 46.1  43.5  PLT 157 123.0* 158 150.0    COAGS: No results for input(s): INR, APTT in the last 8760 hours.  BMP: Recent Labs    02/15/20 1343 06/12/20 1132 06/17/20 1322 12/18/20 1126  NA 139 137 142 140  K 4.4 4.1 4.2 4.0  CL 106 104 106 106  CO2 26 27 27 28   GLUCOSE 87 110* 97 90  BUN 15 19 14 14   CALCIUM 9.1 8.8 9.0 8.8  CREATININE 1.44* 1.12 1.22 1.17  GFRNONAA 49*  --  59*  --     LIVER FUNCTION TESTS: Recent Labs    02/15/20 1343 06/12/20 1132 06/17/20 1322 12/18/20 1126  BILITOT 1.5* 2.2* 1.7* 1.7*  AST 25 29 24 24   ALT 27 30 28 22   ALKPHOS 52 52 56 58  PROT 7.2 6.6 6.9 6.8  ALBUMIN 3.8 4.0 3.8 4.0    TUMOR MARKERS: No results for input(s): AFPTM, CEA, CA199, CHROMGRNA in the last 8760 hours.  Assessment and Plan:  80 y/o M with history of diffuse large B-cell lymphoma now in remission who presents today for port removal with moderate sedation.  Risks and benefits of image-guided Port-a-catheter placement were discussed with the patient including, but not limited to bleeding, infection, pneumothorax, or fibrin sheath development and need for additional procedures.  All of the patient's questions were answered, patient is agreeable to proceed.  Consent signed and in chart.  Thank you for this interesting consult.  I greatly enjoyed meeting EMAN MORIMOTO and look forward to participating in their care.  A copy of this report was sent to the  requesting provider on this date.  Electronically Signed: Joaquim Nam, PA-C 01/22/2021, 10:46 AM   I spent a total of 30 Minutes  in face to face in clinical consultation, greater than 50% of which was counseling/coordinating care for port removal.

## 2021-01-22 NOTE — Procedures (Signed)
Interventional Radiology Procedure Note  Procedure: Port removal  Complications: None  Estimated Blood Loss: < 10 mL  Findings: Right chest port removed in entirety using sharp and blunt dissection. Wound closed.  Venetia Night. Kathlene Cote, M.D Pager:  [phone]

## 2021-02-19 DIAGNOSIS — I2510 Atherosclerotic heart disease of native coronary artery without angina pectoris: Secondary | ICD-10-CM | POA: Diagnosis not present

## 2021-02-19 DIAGNOSIS — I255 Ischemic cardiomyopathy: Secondary | ICD-10-CM | POA: Diagnosis not present

## 2021-02-19 DIAGNOSIS — I10 Essential (primary) hypertension: Secondary | ICD-10-CM | POA: Diagnosis not present

## 2021-02-19 DIAGNOSIS — R7303 Prediabetes: Secondary | ICD-10-CM | POA: Diagnosis not present

## 2021-02-25 ENCOUNTER — Telehealth: Payer: Self-pay | Admitting: Internal Medicine

## 2021-02-25 NOTE — Telephone Encounter (Signed)
Labs have been faxed to Dr. Terrence Dupont office.

## 2021-02-25 NOTE — Telephone Encounter (Signed)
Patient called and requested that his most recent labs be faxed to Dr Terrence Dupont at [phone]

## 2021-03-04 DIAGNOSIS — I25119 Atherosclerotic heart disease of native coronary artery with unspecified angina pectoris: Secondary | ICD-10-CM | POA: Diagnosis not present

## 2021-03-04 DIAGNOSIS — C8330 Diffuse large B-cell lymphoma, unspecified site: Secondary | ICD-10-CM | POA: Diagnosis not present

## 2021-03-04 DIAGNOSIS — I771 Stricture of artery: Secondary | ICD-10-CM | POA: Diagnosis not present

## 2021-03-04 DIAGNOSIS — I10 Essential (primary) hypertension: Secondary | ICD-10-CM | POA: Diagnosis not present

## 2021-03-04 DIAGNOSIS — D8481 Immunodeficiency due to conditions classified elsewhere: Secondary | ICD-10-CM | POA: Diagnosis not present

## 2021-03-04 DIAGNOSIS — I700 Atherosclerosis of aorta: Secondary | ICD-10-CM | POA: Diagnosis not present

## 2021-03-04 DIAGNOSIS — N529 Male erectile dysfunction, unspecified: Secondary | ICD-10-CM | POA: Diagnosis not present

## 2021-03-04 DIAGNOSIS — M1990 Unspecified osteoarthritis, unspecified site: Secondary | ICD-10-CM | POA: Diagnosis not present

## 2021-03-04 DIAGNOSIS — Z803 Family history of malignant neoplasm of breast: Secondary | ICD-10-CM | POA: Diagnosis not present

## 2021-03-04 DIAGNOSIS — K5900 Constipation, unspecified: Secondary | ICD-10-CM | POA: Diagnosis not present

## 2021-03-04 DIAGNOSIS — M792 Neuralgia and neuritis, unspecified: Secondary | ICD-10-CM | POA: Diagnosis not present

## 2021-03-04 DIAGNOSIS — R2681 Unsteadiness on feet: Secondary | ICD-10-CM | POA: Diagnosis not present

## 2021-03-04 DIAGNOSIS — Z7982 Long term (current) use of aspirin: Secondary | ICD-10-CM | POA: Diagnosis not present

## 2021-03-04 DIAGNOSIS — I255 Ischemic cardiomyopathy: Secondary | ICD-10-CM | POA: Diagnosis not present

## 2021-03-04 DIAGNOSIS — Z6830 Body mass index (BMI) 30.0-30.9, adult: Secondary | ICD-10-CM | POA: Diagnosis not present

## 2021-03-04 DIAGNOSIS — E785 Hyperlipidemia, unspecified: Secondary | ICD-10-CM | POA: Diagnosis not present

## 2021-03-04 DIAGNOSIS — R32 Unspecified urinary incontinence: Secondary | ICD-10-CM | POA: Diagnosis not present

## 2021-03-04 DIAGNOSIS — I252 Old myocardial infarction: Secondary | ICD-10-CM | POA: Diagnosis not present

## 2021-03-14 DIAGNOSIS — L309 Dermatitis, unspecified: Secondary | ICD-10-CM | POA: Diagnosis not present

## 2021-04-10 ENCOUNTER — Other Ambulatory Visit: Payer: Self-pay | Admitting: Internal Medicine

## 2021-04-11 ENCOUNTER — Other Ambulatory Visit (HOSPITAL_COMMUNITY): Payer: Self-pay

## 2021-04-11 ENCOUNTER — Telehealth: Payer: Self-pay

## 2021-04-11 NOTE — Telephone Encounter (Signed)
Patient Advocate Encounter ?  ?Received notification from [facility] that prior authorization for Testosterone 20.25mg  1.62% gel is required by his/her insurance [employer]. ?  ?PA submitted on 04/11/21 ? ?Key#: [id number] ? ?Status is pending ?   ?[facility] will continue to follow: ? ?Patient Advocate ?Fax: [phone]  ?

## 2021-04-11 NOTE — Telephone Encounter (Signed)
Patient Advocate Encounter ? ?Prior Authorization for Testosterone 1.62% gel has been approved.   ? ?PA# [id number] ? ?Effective dates: 02/09/21 through 02/08/22 ? ?Per Test Claim Patients co-pay is $10.  ? ?Spoke with Pharmacy to Process. ? ?Patient Advocate ?Fax: [phone]  ?

## 2021-04-24 DIAGNOSIS — I10 Essential (primary) hypertension: Secondary | ICD-10-CM | POA: Diagnosis not present

## 2021-04-24 DIAGNOSIS — I2510 Atherosclerotic heart disease of native coronary artery without angina pectoris: Secondary | ICD-10-CM | POA: Diagnosis not present

## 2021-04-24 DIAGNOSIS — I255 Ischemic cardiomyopathy: Secondary | ICD-10-CM | POA: Diagnosis not present

## 2021-04-24 DIAGNOSIS — E785 Hyperlipidemia, unspecified: Secondary | ICD-10-CM | POA: Diagnosis not present

## 2021-05-29 ENCOUNTER — Telehealth: Payer: Self-pay | Admitting: Hematology

## 2021-05-29 NOTE — Telephone Encounter (Signed)
Per provider schedule change called and left message for pt about change in appointments. Call back number if changes are needed ?

## 2021-06-17 ENCOUNTER — Other Ambulatory Visit: Payer: Medicare PPO

## 2021-06-17 ENCOUNTER — Ambulatory Visit: Payer: Medicare PPO | Admitting: Hematology

## 2021-06-19 ENCOUNTER — Other Ambulatory Visit: Payer: Self-pay

## 2021-06-19 ENCOUNTER — Inpatient Hospital Stay: Payer: Medicare PPO | Attending: Hematology

## 2021-06-19 ENCOUNTER — Inpatient Hospital Stay: Payer: Medicare PPO | Admitting: Hematology

## 2021-06-19 VITALS — BP 109/85 | HR 67 | Temp 97.5°F | Resp 18 | Wt 236.5 lb

## 2021-06-19 DIAGNOSIS — C8339 Diffuse large B-cell lymphoma, extranodal and solid organ sites: Secondary | ICD-10-CM | POA: Diagnosis not present

## 2021-06-19 DIAGNOSIS — I252 Old myocardial infarction: Secondary | ICD-10-CM | POA: Diagnosis not present

## 2021-06-19 DIAGNOSIS — Z79899 Other long term (current) drug therapy: Secondary | ICD-10-CM | POA: Diagnosis not present

## 2021-06-19 LAB — CBC WITH DIFFERENTIAL/PLATELET
Abs Immature Granulocytes: 0.01 10*3/uL (ref 0.00–0.07)
Basophils Absolute: 0 10*3/uL (ref 0.0–0.1)
Basophils Relative: 1 %
Eosinophils Absolute: 0.2 10*3/uL (ref 0.0–0.5)
Eosinophils Relative: 3 %
HCT: 40.5 % (ref 39.0–52.0)
Hemoglobin: 13.9 g/dL (ref 13.0–17.0)
Immature Granulocytes: 0 %
Lymphocytes Relative: 24 %
Lymphs Abs: 1.1 10*3/uL (ref 0.7–4.0)
MCH: 32.6 pg (ref 26.0–34.0)
MCHC: 34.3 g/dL (ref 30.0–36.0)
MCV: 94.8 fL (ref 80.0–100.0)
Monocytes Absolute: 0.6 10*3/uL (ref 0.1–1.0)
Monocytes Relative: 12 %
Neutro Abs: 2.8 10*3/uL (ref 1.7–7.7)
Neutrophils Relative %: 60 %
Platelets: 156 10*3/uL (ref 150–400)
RBC: 4.27 MIL/uL (ref 4.22–5.81)
RDW: 12.1 % (ref 11.5–15.5)
WBC: 4.7 10*3/uL (ref 4.0–10.5)
nRBC: 0 % (ref 0.0–0.2)

## 2021-06-19 LAB — CMP (CANCER CENTER ONLY)
ALT: 24 U/L (ref 0–44)
AST: 21 U/L (ref 15–41)
Albumin: 3.8 g/dL (ref 3.5–5.0)
Alkaline Phosphatase: 47 U/L (ref 38–126)
Anion gap: 4 — ABNORMAL LOW (ref 5–15)
BUN: 12 mg/dL (ref 8–23)
CO2: 27 mmol/L (ref 22–32)
Calcium: 8.9 mg/dL (ref 8.9–10.3)
Chloride: 108 mmol/L (ref 98–111)
Creatinine: 1.1 mg/dL (ref 0.61–1.24)
GFR, Estimated: >60 mL/min (ref >60)
Glucose, Bld: 132 mg/dL — ABNORMAL HIGH (ref 70–99)
Potassium: 4.1 mmol/L (ref 3.5–5.1)
Sodium: 139 mmol/L (ref 135–145)
Total Bilirubin: 1.9 mg/dL — ABNORMAL HIGH (ref 0.3–1.2)
Total Protein: 6.7 g/dL (ref 6.5–8.1)

## 2021-06-19 LAB — LACTATE DEHYDROGENASE: LDH: 119 U/L (ref 98–192)

## 2021-06-25 ENCOUNTER — Encounter: Payer: Self-pay | Admitting: Internal Medicine

## 2021-06-25 ENCOUNTER — Ambulatory Visit: Payer: Medicare PPO | Admitting: Internal Medicine

## 2021-06-25 VITALS — BP 120/80 | HR 66 | Ht 73.0 in | Wt 238.0 lb

## 2021-06-25 DIAGNOSIS — E291 Testicular hypofunction: Secondary | ICD-10-CM

## 2021-06-25 LAB — BASIC METABOLIC PANEL
BUN: 14 mg/dL (ref 6–23)
CO2: 27 mEq/L (ref 19–32)
Calcium: 8.7 mg/dL (ref 8.4–10.5)
Chloride: 105 mEq/L (ref 96–112)
Creatinine, Ser: 1.07 mg/dL (ref 0.40–1.50)
GFR: 64.31 mL/min (ref >60.00)
Glucose, Bld: 100 mg/dL — ABNORMAL HIGH (ref 70–99)
Potassium: 4.3 mEq/L (ref 3.5–5.1)
Sodium: 139 mEq/L (ref 135–145)

## 2021-06-25 LAB — CBC
HCT: 41.8 % (ref 39.0–52.0)
Hemoglobin: 14.1 g/dL (ref 13.0–17.0)
MCHC: 33.7 g/dL (ref 30.0–36.0)
MCV: 97.1 fl (ref 78.0–100.0)
Platelets: 126 10*3/uL — ABNORMAL LOW (ref 150.0–400.0)
RBC: 4.31 Mil/uL (ref 4.22–5.81)
RDW: 12.9 % (ref 11.5–15.5)
WBC: 5.2 10*3/uL (ref 4.0–10.5)

## 2021-06-25 LAB — T4, FREE: Free T4: 0.83 ng/dL (ref 0.60–1.60)

## 2021-06-25 LAB — PSA: PSA: 2.65 ng/mL (ref 0.10–4.00)

## 2021-06-25 LAB — TSH: TSH: 0.84 u[IU]/mL (ref 0.35–5.50)

## 2021-06-25 NOTE — Progress Notes (Signed)
? ? ?HEMATOLOGY/ONCOLOGY CLINIC NOTE ? ?Date of Service: .06/19/2021 ? ? ? ?Patient Care Team: ?[name], MD as PCP - General (Family Medicine) ?[name], [name], MD as Consulting Physician (Endocrinology) ? ?CHIEF COMPLAINTS/PURPOSE OF CONSULTATION:  ?Follow-up for primary testicular large B-cell lymphoma for continued surveillance ? ?HISTORY OF PRESENTING ILLNESS:  ? ?Please see previous note for details on initial presentation ? ?INTERVAL HISTORY:  ? ?Carl [name] is here for continued evaluation and management of his primary testicular large B-cell lymphoma for continued surveillance. ?He notes that he has been doing well.  Good p.o. intake.  Weight has been stable. ?No new lumps or bumps. ?No fevers no chills no night sweats no unexpected weight loss. ?No other acute new focal symptoms. ?Labs done today were discussed in detail with the patient. ? ?MEDICAL HISTORY:  ?Past Medical History:  ?Diagnosis Date  ? Arthritis   ? Coronary artery disease   ? HOH (hard of hearing)   ? Hyperlipidemia   ? Hypertension   ? Ischemic cardiomyopathy   ? Myocardial infarction [facility]) 2016  ? Quadriceps tendon rupture   ? LEFT  ? ? ?SURGICAL HISTORY: ?Past Surgical History:  ?Procedure Laterality Date  ? BIOPSY  04/27/2018  ? Procedure: BIOPSY;  Surgeon: Ronnette Juniper, MD;  Location: [facility];  Service: Gastroenterology;;  ? CARDIAC CATHETERIZATION N/A 10/15/2014  ? Procedure: Left Heart Cath and Coronary Angiography;  Surgeon: Charolette Forward, MD;  Location: [place] [facility];  Service: Cardiovascular;  Laterality: N/A;  ? COLONOSCOPY    ? ESOPHAGOGASTRODUODENOSCOPY (EGD) WITH PROPOFOL N/A 04/27/2018  ? Procedure: ESOPHAGOGASTRODUODENOSCOPY (EGD) WITH PROPOFOL;  Surgeon: Ronnette Juniper, MD;  Location: [facility];  Service: Gastroenterology;  Laterality: N/A;  ? IR IMAGING GUIDED PORT INSERTION  03/14/2018  ? IR REMOVAL TUN ACCESS W/ PORT W/O FL MOD SED  01/22/2021  ? MICROLARYNGOSCOPY WITH CO2 LASER AND  EXCISION OF VOCAL CORD LESION N/A 11/26/2017  ? Procedure: MICROLARYNGOSCOPY WITH CO2 LASER AND EXCISION OF VOCAL CORD LESION WITH JET VENTILATION;  Surgeon: Melida Quitter, MD;  Location: [place];  Service: ENT;  Laterality: N/A;  ? QUADRICEPS TENDON REPAIR Left 02/20/2014  ? Procedure: LEFT REPAIR QUADRICEP TENDON;  Surgeon: Mauri Pole, MD;  Location: [facility];  Service: Orthopedics;  Laterality: Left;  ? THROAT SURGERY  2010  ? ? ?SOCIAL HISTORY: ?Social History  ? ?Socioeconomic History  ? Marital status: Divorced  ?  Spouse name: Not on file  ? Number of children: Not on file  ? Years of education: Not on file  ? Highest education level: Not on file  ?Occupational History  ? Not on file  ?Tobacco Use  ? Smoking status: Never  ? Smokeless tobacco: Never  ?Vaping Use  ? Vaping Use: Never used  ?Substance and Sexual Activity  ? Alcohol use: Not Currently  ? Drug use: No  ? Sexual activity: Not on file  ?Other Topics Concern  ? Not on file  ?Social History Narrative  ? Not on file  ? ?Social Determinants of Health  ? ?Financial Resource Strain: Not on file  ?Food Insecurity: Not on file  ?Transportation Needs: Not on file  ?Physical Activity: Not on file  ?Stress: Not on file  ?Social Connections: Not on file  ?Intimate Partner Violence: Not on file  ? ? ?FAMILY HISTORY: ?No family history on file. ? ?ALLERGIES:  has No Known Allergies. ? ?MEDICATIONS:  ?Current Outpatient Medications  ?Medication Sig Dispense Refill  ? aspirin  EC 81 MG tablet Take 81 mg by mouth daily.    ? atorvastatin (LIPITOR) 40 MG tablet Take 1 tablet (40 mg total) by mouth daily at 6 PM. 60 tablet 3  ? Calcium Carbonate-Vit D-Min (CALCIUM 1200 PO) Take 1 tablet by mouth daily after breakfast.    ? carvedilol (COREG) 6.25 MG tablet Take 3.125 mg by mouth 2 (two) times daily with a meal.     ? cholecalciferol (VITAMIN D) 1000 UNITS tablet Take 5,000 Units by mouth daily.     ? losartan (COZAAR) 25 MG tablet Take 25 mg by mouth daily after  breakfast.     ? Multiple Vitamin (MULTIVITAMIN WITH MINERALS) TABS tablet Take 1 tablet by mouth daily.    ? nitroGLYCERIN (NITROSTAT) 0.4 MG SL tablet Place 1 tablet (0.4 mg total) under the tongue every 5 (five) minutes x 3 doses as needed for chest pain. 25 tablet 12  ? Polyethyl Glycol-Propyl Glycol (SYSTANE OP) Place 1 drop into both eyes at bedtime.    ? Testosterone 20.25 MG/ACT (1.62%) GEL APPLY 1 PUMP ONTO THE SKIN ONCE DAILY 75 g 0  ? white petrolatum (VASELINE) GEL Apply 1 application topically as needed for dry skin (itching).    ? ?No current facility-administered medications for this visit.  ? ?Facility-Administered Medications Ordered in Other Visits  ?Medication Dose Route Frequency Provider Last Rate Last Admin  ? influenza vaccine adjuvanted (FLUAD) injection 0.5 mL  0.5 mL Intramuscular Once Brunetta Genera, MD      ? ? ?REVIEW OF SYSTEMS:   ?10 Point review of Systems was done is negative except as noted above. ? ?PHYSICAL EXAMINATION: ?ECOG PERFORMANCE STATUS: 2 - Symptomatic, <50% confined to bed ? ?Vitals:  ? 06/19/21 1150  ?BP: 109/85  ?Pulse: 67  ?Resp: 18  ?Temp: (!) 97.5 ?F (36.4 ?C)  ?SpO2: 96%  ? ?Filed Weights  ? 06/19/21 1150  ?Weight: 236 lb 8 oz (107.3 kg)  ? ?.Body mass index is 31.2 kg/m?.  ? ?NAD ?GENERAL:alert, in no acute distress and comfortable ?SKIN: no acute rashes, no significant lesions ?EYES: conjunctiva are pink and non-injected, sclera anicteric ?OROPHARYNX: MMM, no exudates, no oropharyngeal erythema or ulceration ?NECK: supple, no JVD ?LYMPH:  no palpable lymphadenopathy in the cervical, axillary or inguinal regions ?LUNGS: clear to auscultation b/l with normal respiratory effort ?HEART: regular rate & rhythm ?ABDOMEN:  normoactive bowel sounds , non tender, not distended. ?Extremity: no pedal edema ?PSYCH: alert & oriented x 3 with fluent speech ?NEURO: no focal motor/sensory deficits, hard of hearing ? ? ? ?LABORATORY DATA:  ?I have reviewed the data as  listed ? ?. ? ?  Latest Ref Rng & Units 06/25/2021  ? 11:25 AM 06/19/2021  ? 11:19 AM 12/18/2020  ? 11:26 AM  ?CBC  ?WBC 4.0 - 10.5 K/uL 5.2   4.7   4.3    ?Hemoglobin 13.0 - 17.0 g/dL 14.1   13.9   14.5    ?Hematocrit 39.0 - 52.0 % 41.8   40.5   43.5    ?Platelets 150.0 - 400.0 K/uL 126.0   156   150.0    ? ? ?. ? ?  Latest Ref Rng & Units 06/25/2021  ? 11:25 AM 06/19/2021  ? 11:19 AM 12/18/2020  ? 11:26 AM  ?CMP  ?Glucose 70 - 99 mg/dL 100   132   90    ?BUN 6 - 23 mg/dL '14   12   14    '$ ?  Creatinine 0.40 - 1.50 mg/dL 1.07   1.10   1.17    ?Sodium 135 - 145 mEq/L 139   139   140    ?Potassium 3.5 - 5.1 mEq/L 4.3   4.1   4.0    ?Chloride 96 - 112 mEq/L 105   108   106    ?CO2 19 - 32 mEq/L '27   27   28    '$ ?Calcium 8.4 - 10.5 mg/dL 8.7   8.9   8.8    ?Total Protein 6.5 - 8.1 g/dL  6.7   6.8    ?Total Bilirubin 0.3 - 1.2 mg/dL  1.9   1.7    ?Alkaline Phos 38 - 126 U/L  47   58    ?AST 15 - 41 U/L  21   24    ?ALT 0 - 44 U/L  24   22    ? ?. ?Lab Results  ?Component Value Date  ? LDH 119 06/19/2021  ? ? ? ?01/05/18 Pathology: ? ? ? ?RADIOGRAPHIC STUDIES: ?I have personally reviewed the radiological images as listed and agreed with the findings in the report. ?No results found. ? ?ASSESSMENT & PLAN:  ? ?81 y.o. male with ? ?1. Primary Testicular Diffuse Large B-Cell Lymphoma, Stage 1E ?11/30/16 NM Myocar Multi w/spect w/wall motion which revealed a LV EF of 44% ? ?12/08/17 US Scrotum revealed Abnormal appearance of the left testicle which is larger than the right and diffusely heterogeneous in echotexture. Cannot exclude infiltrating process/mass. Appearance is concerning for possible neoplasm. ? ?01/05/18 Left testes biopsy revealed Primary Testicular Diffuse Large B-Cell Lymphoma  ? ?01/25/18 Hep B, Hep C and HIV negative ? ?01/31/18 ECHO revealed a LV EF of 51%. Left ventricle: The cavity size was normal. Wall motion was normal; there were no regional wall motion abnormalities. Atrial septum: No defect or patent foramen  ovale was identified. ? ?02/07/18 PET/CT revealed No findings for metabolically active lymphoma involving the neck, chest, abdomen/pelvis or osseous structures. ? ?Due to admission after first intrathecal methotrexate

## 2021-06-25 NOTE — Progress Notes (Signed)
Name: Carl [name]  MRN/ DOB: [id number], 1940/05/25    Age/ Sex: 81 y.o., male     PCP: [name], MD   Reason for Endocrinology Evaluation: Testicular radiotherapy/ Hypogonadism      Initial Endocrinology Clinic Visit: 04/11/2019    PATIENT IDENTIFIER: Carl [name] is a 81 y.o., male with a past medical history of CAD ( S/P DES 2016) and HTN . He has followed with [facility] since 04/11/2019 for consultative assistance with management of his Testicular radiotherapy  HISTORICAL SUMMARY:  Pt was diagnosed with testicular lymphoma in 01/2018. He is S/P chemo and scrotal Radiation therapy, he completed radiation therapy in 09/2018.    He was referred here to evaluate , monitor and treat for hypogonadism should it develop following radiation exposure.  Sees Dr. 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   Weight has been stable  Energy level stable  He sleeps well at night  Nocturia x1  He has been taking testosterone 1 pump to one shoulder daily  Denies skin  rash  No recent PE/DVA  No CAD    HISTORY:  Past Medical History:  Past Medical History:  Diagnosis Date   Arthritis    Coronary artery disease    HOH (hard of hearing)    Hyperlipidemia    Hypertension    Ischemic cardiomyopathy    Myocardial infarction ([place]) 2016   Quadriceps tendon rupture    LEFT   Past Surgical History:  Past Surgical History:  Procedure Laterality Date   BIOPSY  04/27/2018   Procedure: BIOPSY;  Surgeon: Ronnette Juniper, MD;  Location: [facility];  Service: Gastroenterology;;   CARDIAC CATHETERIZATION N/A 10/15/2014   Procedure: Left Heart Cath and Coronary Angiography;  Surgeon: Charolette Forward, MD;  Location: [facility];  Service: Cardiovascular;  Laterality: N/A;   COLONOSCOPY     ESOPHAGOGASTRODUODENOSCOPY (EGD) WITH PROPOFOL N/A 04/27/2018   Procedure: ESOPHAGOGASTRODUODENOSCOPY (EGD) WITH PROPOFOL;  Surgeon: Ronnette Juniper, MD;  Location: [facility];  Service: Gastroenterology;  Laterality: N/A;   IR IMAGING GUIDED PORT INSERTION  03/14/2018   IR REMOVAL TUN ACCESS W/ PORT W/O FL MOD SED  01/22/2021   MICROLARYNGOSCOPY WITH CO2 LASER AND EXCISION OF VOCAL CORD LESION N/A 11/26/2017   Procedure: MICROLARYNGOSCOPY WITH CO2 LASER AND EXCISION OF VOCAL CORD LESION WITH JET VENTILATION;  Surgeon: Melida Quitter, MD;  Location: [facility];  Service: ENT;  Laterality: N/A;   QUADRICEPS TENDON REPAIR Left 02/20/2014   Procedure: LEFT REPAIR QUADRICEP TENDON;  Surgeon: Mauri Pole, MD;  Location: [facility];  Service: Orthopedics;  Laterality: Left;   THROAT SURGERY  2010   Social History:  reports that he has never smoked. He has never used smokeless tobacco. He reports that he does not currently use alcohol. He reports that he does not use drugs. Family History: No family history on file.   HOME MEDICATIONS: Allergies as of 06/25/2021   No Known Allergies      Medication List        Accurate as of Jun 25, 2021 11:15 AM. If you have any questions, ask your nurse or doctor.          aspirin EC 81 MG tablet Take 81  mg by mouth daily.   atorvastatin 40 MG tablet Commonly known as: LIPITOR Take 1 tablet (40 mg total) by mouth daily at 6 PM.   CALCIUM 1200 PO Take 1 tablet by mouth daily after breakfast.   carvedilol 6.25 MG tablet Commonly known as: COREG Take 3.125 mg by mouth 2 (two) times daily with a meal.   cholecalciferol 1000 units tablet Commonly known as: VITAMIN D Take 5,000 Units by mouth daily.   losartan 25 MG tablet Commonly known as: COZAAR Take 25 mg by mouth daily after breakfast.   multivitamin with minerals Tabs tablet Take 1 tablet by mouth daily.   nitroGLYCERIN 0.4 MG SL  tablet Commonly known as: NITROSTAT Place 1 tablet (0.4 mg total) under the tongue every 5 (five) minutes x 3 doses as needed for chest pain.   SYSTANE OP Place 1 drop into both eyes at bedtime.   Testosterone 20.25 MG/ACT (1.62%) Gel APPLY 1 PUMP ONTO THE SKIN ONCE DAILY   white petrolatum Gel Commonly known as: VASELINE Apply 1 application topically as needed for dry skin (itching).          OBJECTIVE:   PHYSICAL EXAM: VS: BP 120/80 (BP Location: Left Arm, Patient Position: Sitting, Cuff Size: Large)   Pulse 66   Ht '6\' 1"'$  (1.854 m)   Wt 238 lb (108 kg)   SpO2 98%   BMI 31.40 kg/m    EXAM: General: Pt appears well and is in NAD  Lungs: Clear with good BS bilat with no rales, rhonchi, or wheezes  Heart: Auscultation: RRR.  Abdomen: Normoactive bowel sounds, soft, nontender, without masses or organomegaly palpable  Extremities:  BL LE: No pretibial edema normal ROM and strength.  Mental Status: Judgment, insight: Intact Orientation: Oriented to time, place, and person Mood and affect: No depression, anxiety, or agitation     DATA REVIEWED:  Latest Reference Range & Units 06/19/21 11:19  Sodium 135 - 145 mmol/L 139  Potassium 3.5 - 5.1 mmol/L 4.1  Chloride 98 - 111 mmol/L 108  CO2 22 - 32 mmol/L 27  Glucose 70 - 99 mg/dL 132 (H)  BUN 8 - 23 mg/dL 12  Creatinine 0.61 - 1.24 mg/dL 1.10  Calcium 8.9 - 10.3 mg/dL 8.9  Anion gap 5 - 15  4 (L)  Alkaline Phosphatase 38 - 126 U/L 47  Albumin 3.5 - 5.0 g/dL 3.8  AST 15 - 41 U/L 21  ALT 0 - 44 U/L 24  Total Protein 6.5 - 8.1 g/dL 6.7  Total Bilirubin 0.3 - 1.2 mg/dL 1.9 (H)  GFR, Est Non African American >60 mL/min >60    Latest Reference Range & Units 06/25/21 11:25  Sex Horm Binding Glob, Serum 22 - 77 nmol/L 90 (H)  Testosterone 250 - 827 ng/dL 629  Testosterone, Bioavailable 15.0 - 150.0 ng/dL 62.0  Testosterone Free 6.0 - 73.0 pg/mL 35.4    ASSESSMENT / PLAN / RECOMMENDATIONS:    1. Hypogonadism:  -  He is tolerating testosterone gel without side effects - Testosterone goal for him given advanced age is low normal level. When he was on the 2 pumps a day his, testosterone was elevated at 653 ng/dL , we reduced it to 1 pump a day , it is acceptable at this time with normal free testosterone.  Part of his total testosterone is due to elevation of sex hormone binding globulin -CBC CMP and PSA are normal, no changes  Medication  Continue testosterone gel 1 pump to  1 shoulder daily ( 1 pump daily )      F/U in 6 months      Signed electronically by: Mack Guise, MD  [facility] Endocrin[facility]  [address], [place] [address] Phone: [phone] FAX: [phone]      CC: [name], [place] [address] Phone: [phone]  Fax: [phone]   Return to Endocrinology clinic as below: Future Appointments  Date Time Provider [place]  12/19/2021 11:30 AM [facility] None  12/19/2021 12:00 PM Brunetta Genera, MD [facility] None

## 2021-06-26 LAB — TESTOSTERONE TOTAL,FREE,BIO, MALES
Albumin: 3.8 g/dL (ref 3.6–5.1)
Sex Hormone Binding: 90 nmol/L — ABNORMAL HIGH (ref 22–77)
Testosterone, Bioavailable: 62 ng/dL (ref 15.0–150.0)
Testosterone, Free: 35.4 pg/mL (ref 6.0–73.0)
Testosterone: 629 ng/dL (ref 250–827)

## 2021-06-27 ENCOUNTER — Encounter: Payer: Self-pay | Admitting: Internal Medicine

## 2021-06-27 MED ORDER — TESTOSTERONE 20.25 MG/ACT (1.62%) TD GEL: 1.0000 | Freq: Every day | TRANSDERMAL | 5 refills | Status: DC

## 2021-07-15 DIAGNOSIS — H25813 Combined forms of age-related cataract, bilateral: Secondary | ICD-10-CM | POA: Diagnosis not present

## 2021-07-15 DIAGNOSIS — H04123 Dry eye syndrome of bilateral lacrimal glands: Secondary | ICD-10-CM | POA: Diagnosis not present

## 2021-07-15 DIAGNOSIS — H40013 Open angle with borderline findings, low risk, bilateral: Secondary | ICD-10-CM | POA: Diagnosis not present

## 2021-07-15 DIAGNOSIS — H35363 Drusen (degenerative) of macula, bilateral: Secondary | ICD-10-CM | POA: Diagnosis not present

## 2021-07-31 DIAGNOSIS — I2510 Atherosclerotic heart disease of native coronary artery without angina pectoris: Secondary | ICD-10-CM | POA: Diagnosis not present

## 2021-07-31 DIAGNOSIS — I10 Essential (primary) hypertension: Secondary | ICD-10-CM | POA: Diagnosis not present

## 2021-07-31 DIAGNOSIS — R7303 Prediabetes: Secondary | ICD-10-CM | POA: Diagnosis not present

## 2021-07-31 DIAGNOSIS — I255 Ischemic cardiomyopathy: Secondary | ICD-10-CM | POA: Diagnosis not present

## 2021-07-31 DIAGNOSIS — E785 Hyperlipidemia, unspecified: Secondary | ICD-10-CM | POA: Diagnosis not present

## 2021-09-18 ENCOUNTER — Encounter: Payer: Medicare PPO | Admitting: Family

## 2021-09-19 NOTE — Progress Notes (Signed)
  This encounter was created in error - please disregard. No show 

## 2021-09-30 ENCOUNTER — Encounter: Payer: Self-pay | Admitting: Family Medicine

## 2021-09-30 ENCOUNTER — Ambulatory Visit: Payer: Medicare PPO | Admitting: Family Medicine

## 2021-09-30 VITALS — BP 118/78 | HR 60 | Temp 97.7°F | Resp 20 | Ht 73.0 in | Wt 240.8 lb

## 2021-09-30 DIAGNOSIS — I2510 Atherosclerotic heart disease of native coronary artery without angina pectoris: Secondary | ICD-10-CM | POA: Diagnosis not present

## 2021-09-30 DIAGNOSIS — I159 Secondary hypertension, unspecified: Secondary | ICD-10-CM | POA: Diagnosis not present

## 2021-09-30 DIAGNOSIS — E7849 Other hyperlipidemia: Secondary | ICD-10-CM | POA: Diagnosis not present

## 2021-09-30 DIAGNOSIS — I10 Essential (primary) hypertension: Secondary | ICD-10-CM | POA: Insufficient documentation

## 2021-09-30 DIAGNOSIS — E291 Testicular hypofunction: Secondary | ICD-10-CM | POA: Diagnosis not present

## 2021-09-30 DIAGNOSIS — E785 Hyperlipidemia, unspecified: Secondary | ICD-10-CM | POA: Insufficient documentation

## 2021-09-30 NOTE — Patient Instructions (Signed)
No changes in your medicines Keep working on your weight by walking and watching carbs in your food

## 2021-09-30 NOTE — Progress Notes (Signed)
Provider:  Alain Honey, MD  Careteam: Patient Care Team: [name], MD as PCP - General (Family Medicine) [facility], [name], MD as Consulting Physician (Endocrinology)  PLACE OF SERVICE:  [facility] Directive information Does Patient Have a Medical Advance Directive?: Yes, Type of Advance Directive: [place];Living will, Does patient want to make changes to medical advance directive?: No - Patient declined  No Known Allergies  Chief Complaint  Patient presents with   New Patient (Initial Visit)    Patient presents today for a new patient appointment.     HPI: Patient is a 81 y.o. male new patient today.  He is followed by cardiology as well as endocrine.  Had a MI previously with stent.  Medications include carvedilol and losartan as well as atorvastatin for cholesterol.  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  This is followed by endocrinology. He lives alone and does his own cooking.  Most of the day he is spent doing idle chores.  Had formally played golf and I encouraged him to try to get back into that now Had recent labs done about 2 months ago including normal PSA therapeutic testosterone level.  Normal hemoglobin and white count and normal TSH. He is does describe some numbness in both great toes and has been diagnosed with neuropathy, question etiology.  He is not diabetic nor does he have history of lumbar disc disease  Review of Systems:  Review of Systems  Constitutional: Negative.   HENT:  Positive for hearing loss.   Eyes: Negative.   Respiratory: Negative.    Cardiovascular: Negative.   Gastrointestinal: Negative.   Genitourinary: Negative.   Neurological: Negative.   Psychiatric/Behavioral: Negative.    All other systems reviewed and are negative.   Past Medical History:  Diagnosis Date   Arthritis     Coronary artery disease    HOH (hard of hearing)    Hyperlipidemia    Hypertension    Ischemic cardiomyopathy    Lymphoma ([place])    Myocardial infarction ([place]) 2016   Quadriceps tendon rupture    LEFT   Past Surgical History:  Procedure Laterality Date   BIOPSY  04/27/2018   Procedure: BIOPSY;  Surgeon: Ronnette Juniper, MD;  [facility];  Service: Gastroenterology;;   CARDIAC CATHETERIZATION N/A 10/15/2014   Procedure: Left Heart Cath and Coronary Angiography;  Surgeon: Charolette Forward, MD;  Location: [facility];  Service: Cardiovascular;  Laterality: N/A;   COLONOSCOPY     ESOPHAGOGASTRODUODENOSCOPY (EGD) WITH PROPOFOL N/A 04/27/2018   Procedure: ESOPHAGOGASTRODUODENOSCOPY (EGD) WITH PROPOFOL;  Surgeon: Ronnette Juniper, MD;  [facility];  Service: Gastroenterology;  Laterality: N/A;   IR IMAGING GUIDED PORT INSERTION  03/14/2018   IR REMOVAL TUN ACCESS W/ PORT W/O FL MOD SED  01/22/2021   MICROLARYNGOSCOPY WITH CO2 LASER AND EXCISION OF VOCAL CORD LESION N/A 11/26/2017   Procedure: MICROLARYNGOSCOPY WITH CO2 LASER AND EXCISION OF VOCAL CORD LESION WITH JET VENTILATION;  Surgeon: Melida Quitter, MD;  Location: [facility];  Service: ENT;  Laterality: N/A;   QUADRICEPS TENDON REPAIR Left 02/20/2014   Procedure: LEFT REPAIR QUADRICEP TENDON;  Surgeon: Mauri Pole, MD;  [facility];  Service: Orthopedics;  Laterality: Left;   THROAT SURGERY  2010   Social History:   reports that he has quit smoking. His smoking use included cigarettes. He has  never used smokeless tobacco. He reports current alcohol use. He reports that he does not use drugs.  Family History  Problem Relation Age of Onset   Cancer Mother    Heart attack Father    Breast cancer Sister    Breast cancer Sister    Breast cancer Sister    Breast cancer Sister    Heart attack Brother        2005    Medications: Patient's Medications  New Prescriptions   No medications on file  Previous Medications    ASPIRIN EC 81 MG TABLET    Take 81 mg by mouth daily.   ATORVASTATIN (LIPITOR) 40 MG TABLET    Take 1 tablet (40 mg total) by mouth daily at 6 PM.   CALCIUM CARBONATE-VIT D-MIN (CALCIUM 1200 PO)    Take 1 tablet by mouth daily after breakfast.   CARVEDILOL (COREG) 6.25 MG TABLET    Take 3.125 mg by mouth 2 (two) times daily with a meal.    CHOLECALCIFEROL (VITAMIN D) 1000 UNITS TABLET    Take 5,000 Units by mouth daily.    LOSARTAN (COZAAR) 25 MG TABLET    Take 25 mg by mouth daily after breakfast.    MULTIPLE VITAMIN (MULTIVITAMIN WITH MINERALS) TABS TABLET    Take 1 tablet by mouth daily.   NITROGLYCERIN (NITROSTAT) 0.4 MG SL TABLET    Place 1 tablet (0.4 mg total) under the tongue every 5 (five) minutes x 3 doses as needed for chest pain.   POLYETHYL GLYCOL-PROPYL GLYCOL (SYSTANE OP)    Place 1 drop into both eyes at bedtime.   TESTOSTERONE 20.25 MG/ACT (1.62%) GEL    Apply 1 Pump topically daily.   WHITE PETROLATUM (VASELINE) GEL    Apply 1 application topically as needed for dry skin (itching).  Modified Medications   No medications on file  Discontinued Medications   No medications on file    Physical Exam:  Vitals:   09/30/21 1019  BP: 118/78  Pulse: 60  Resp: 20  Temp: 97.7 F (36.5 C)  SpO2: 98%  Weight: 240 lb 12.8 oz (109.2 kg)  Height: '6\' 1"'$  (1.854 m)   Body mass index is 31.77 kg/m. Wt Readings from Last 3 Encounters:  09/30/21 240 lb 12.8 oz (109.2 kg)  06/25/21 238 lb (108 kg)  06/19/21 236 lb 8 oz (107.3 kg)    Physical Exam Vitals and nursing note reviewed.  Constitutional:      Appearance: He is obese.  HENT:     Head: Normocephalic.  Eyes:     Extraocular Movements: Extraocular movements intact.     Pupils: Pupils are equal, round, and reactive to light.     Comments: Has a small bilateral cataracts  Cardiovascular:     Rate and Rhythm: Normal rate and regular rhythm.     Pulses: Normal pulses.  Pulmonary:     Effort: Pulmonary effort is normal.      Breath sounds: Normal breath sounds.  Musculoskeletal:     Cervical back: Normal range of motion.  Skin:    General: Skin is warm.  Neurological:     General: No focal deficit present.     Mental Status: He is alert and oriented to person, place, and time.  Psychiatric:        Mood and Affect: Mood normal.        Behavior: Behavior normal.        Thought Content: Thought content normal.  Judgment: Judgment normal.     Labs reviewed: Basic Metabolic Panel: Recent Labs    12/18/20 1126 06/19/21 1119 06/25/21 1125  NA 140 139 139  K 4.0 4.1 4.3  CL 106 108 105  CO2 '28 27 27  '$ GLUCOSE 90 132* 100*  BUN '14 12 14  '$ CREATININE 1.17 1.10 1.07  CALCIUM 8.8 8.9 8.7  TSH  --   --  0.84   Liver Function Tests: Recent Labs    12/18/20 1126 06/19/21 1119  AST 24 21  ALT 22 24  ALKPHOS 58 47  BILITOT 1.7* 1.9*  PROT 6.8 6.7  ALBUMIN 4.0 3.8   No results for input(s): "LIPASE", "AMYLASE" in the last 8760 hours. No results for input(s): "AMMONIA" in the last 8760 hours. CBC: Recent Labs    12/18/20 1126 06/19/21 1119 06/25/21 1125  WBC 4.3 4.7 5.2  NEUTROABS  --  2.8  --   HGB 14.5 13.9 14.1  HCT 43.5 40.5 41.8  MCV 96.7 94.8 97.1  PLT 150.0 156 126.0*   Lipid Panel: No results for input(s): "CHOL", "HDL", "LDLCALC", "TRIG", "CHOLHDL", "LDLDIRECT" in the last 8760 hours. TSH: Recent Labs    06/25/21 1125  TSH 0.84   A1C: Lab Results  Component Value Date   HGBA1C 5.3 10/15/2014     Assessment/Plan  1. Coronary artery disease involving native heart without angina pectoris, unspecified vessel or lesion type Asymptomatic.  Takes baby aspirin daily.  Has never used nitroglycerin given to him after MI that he had about 7 years ago  2. Hypogonadism in male On testosterone replacement therapy.  Followed by endocrine  3. Secondary hypertension Blood pressure is good at 118/78 on combination losartan and carvedilol  4. Other hyperlipidemia Lipids  were last assessed greater than 1 year ago.  He does take [place] atorvastatin 40 mg.  Will recheck today   Alain Honey, MD [place] [phone]

## 2021-10-01 LAB — LIPID PANEL
Cholesterol: 113 mg/dL (ref <200)
HDL: 55 mg/dL (ref >=40)
LDL Cholesterol (Calc): 45 mg/dL (calc)
Non-HDL Cholesterol (Calc): 58 mg/dL (calc) (ref <130)
Total CHOL/HDL Ratio: 2.1 (calc) (ref <5.0)
Triglycerides: 45 mg/dL (ref <150)

## 2021-10-30 DIAGNOSIS — I2510 Atherosclerotic heart disease of native coronary artery without angina pectoris: Secondary | ICD-10-CM | POA: Diagnosis not present

## 2021-10-30 DIAGNOSIS — E785 Hyperlipidemia, unspecified: Secondary | ICD-10-CM | POA: Diagnosis not present

## 2021-10-30 DIAGNOSIS — I255 Ischemic cardiomyopathy: Secondary | ICD-10-CM | POA: Diagnosis not present

## 2021-10-30 DIAGNOSIS — I10 Essential (primary) hypertension: Secondary | ICD-10-CM | POA: Diagnosis not present

## 2021-12-18 ENCOUNTER — Other Ambulatory Visit: Payer: Self-pay

## 2021-12-18 DIAGNOSIS — C8339 Diffuse large B-cell lymphoma, extranodal and solid organ sites: Secondary | ICD-10-CM

## 2021-12-19 ENCOUNTER — Inpatient Hospital Stay: Payer: Medicare PPO | Attending: Hematology

## 2021-12-19 ENCOUNTER — Inpatient Hospital Stay: Payer: Medicare PPO | Admitting: Hematology

## 2021-12-19 VITALS — BP 127/78 | HR 59 | Temp 97.9°F | Resp 17 | Wt 239.1 lb

## 2021-12-19 DIAGNOSIS — C8339 Diffuse large B-cell lymphoma, extranodal and solid organ sites: Secondary | ICD-10-CM | POA: Insufficient documentation

## 2021-12-19 DIAGNOSIS — I2510 Atherosclerotic heart disease of native coronary artery without angina pectoris: Secondary | ICD-10-CM | POA: Insufficient documentation

## 2021-12-19 DIAGNOSIS — Z79890 Hormone replacement therapy (postmenopausal): Secondary | ICD-10-CM | POA: Insufficient documentation

## 2021-12-19 DIAGNOSIS — E785 Hyperlipidemia, unspecified: Secondary | ICD-10-CM | POA: Diagnosis not present

## 2021-12-19 DIAGNOSIS — I10 Essential (primary) hypertension: Secondary | ICD-10-CM | POA: Diagnosis not present

## 2021-12-19 DIAGNOSIS — I252 Old myocardial infarction: Secondary | ICD-10-CM | POA: Insufficient documentation

## 2021-12-19 DIAGNOSIS — Z79899 Other long term (current) drug therapy: Secondary | ICD-10-CM | POA: Diagnosis not present

## 2021-12-19 LAB — CBC WITH DIFFERENTIAL (CANCER CENTER ONLY)
Abs Immature Granulocytes: 0.01 10*3/uL (ref 0.00–0.07)
Basophils Absolute: 0 10*3/uL (ref 0.0–0.1)
Basophils Relative: 1 %
Eosinophils Absolute: 0.1 10*3/uL (ref 0.0–0.5)
Eosinophils Relative: 3 %
HCT: 42.1 % (ref 39.0–52.0)
Hemoglobin: 14.3 g/dL (ref 13.0–17.0)
Immature Granulocytes: 0 %
Lymphocytes Relative: 31 %
Lymphs Abs: 1.3 10*3/uL (ref 0.7–4.0)
MCH: 32.7 pg (ref 26.0–34.0)
MCHC: 34 g/dL (ref 30.0–36.0)
MCV: 96.3 fL (ref 80.0–100.0)
Monocytes Absolute: 0.6 10*3/uL (ref 0.1–1.0)
Monocytes Relative: 14 %
Neutro Abs: 2.1 10*3/uL (ref 1.7–7.7)
Neutrophils Relative %: 51 %
Platelet Count: 150 10*3/uL (ref 150–400)
RBC: 4.37 MIL/uL (ref 4.22–5.81)
RDW: 12.1 % (ref 11.5–15.5)
WBC Count: 4.2 10*3/uL (ref 4.0–10.5)
nRBC: 0 % (ref 0.0–0.2)

## 2021-12-19 LAB — CMP (CANCER CENTER ONLY)
ALT: 29 U/L (ref 0–44)
AST: 27 U/L (ref 15–41)
Albumin: 3.9 g/dL (ref 3.5–5.0)
Alkaline Phosphatase: 45 U/L (ref 38–126)
Anion gap: 3 — ABNORMAL LOW (ref 5–15)
BUN: 16 mg/dL (ref 8–23)
CO2: 29 mmol/L (ref 22–32)
Calcium: 8.8 mg/dL — ABNORMAL LOW (ref 8.9–10.3)
Chloride: 107 mmol/L (ref 98–111)
Creatinine: 1.18 mg/dL (ref 0.61–1.24)
GFR, Estimated: >60 mL/min (ref >60)
Glucose, Bld: 97 mg/dL (ref 70–99)
Potassium: 4.4 mmol/L (ref 3.5–5.1)
Sodium: 139 mmol/L (ref 135–145)
Total Bilirubin: 1.6 mg/dL — ABNORMAL HIGH (ref 0.3–1.2)
Total Protein: 7.1 g/dL (ref 6.5–8.1)

## 2021-12-19 LAB — LACTATE DEHYDROGENASE: LDH: 139 U/L (ref 98–192)

## 2021-12-25 NOTE — Progress Notes (Signed)
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 He notes that he has been doing well and has no new focal symptoms.  No new scrotal pain or swelling.  No other new lumps or bumps. No fevers no chills no night sweats no unexpected weight loss. No new headaches or focal neurological deficits. No other acute new symptoms. Has taken his flu shot and COVID-19 vaccine and she will be taking his RSV vaccine. Labs done today were discussed in detail with the patient.  MEDICAL HISTORY:  Past Medical History:  Diagnosis Date   Arthritis    Coronary artery disease    HOH (hard of hearing)    Hyperlipidemia    Hypertension    Ischemic cardiomyopathy    Myocardial infarction ([name]) 2016   Quadriceps tendon rupture    LEFT    SURGICAL HISTORY: Past Surgical History:  Procedure Laterality Date   BIOPSY  04/27/2018   Procedure: BIOPSY;  Surgeon: Ronnette Juniper, MD;  Location: [facility];  Service: Gastroenterology;;   CARDIAC CATHETERIZATION N/A 10/15/2014   Procedure: Left Heart Cath and Coronary Angiography;  Surgeon: Charolette Forward, MD;  Location: [facility];  Service: Cardiovascular;  Laterality: N/A;   COLONOSCOPY     ESOPHAGOGASTRODUODENOSCOPY (EGD) WITH PROPOFOL N/A 04/27/2018   Procedure: ESOPHAGOGASTRODUODENOSCOPY (EGD) WITH PROPOFOL;  Surgeon: Ronnette Juniper, MD;  [facility];  Service: Gastroenterology;  Laterality: N/A;   IR IMAGING GUIDED  PORT INSERTION  03/14/2018   IR REMOVAL TUN ACCESS W/ PORT W/O FL MOD SED  01/22/2021   MICROLARYNGOSCOPY WITH CO2 LASER AND EXCISION OF VOCAL CORD LESION N/A 11/26/2017   Procedure: MICROLARYNGOSCOPY WITH CO2 LASER AND EXCISION OF VOCAL CORD LESION WITH JET VENTILATION;  Surgeon: Melida Quitter, MD;  Location: [facility];  Service: ENT;  Laterality: N/A;   QUADRICEPS TENDON REPAIR Left 02/20/2014   Procedure: LEFT REPAIR QUADRICEP TENDON;  Surgeon: Mauri Pole, MD;  Location: [facility];  Service: Orthopedics;  Laterality: Left;   THROAT SURGERY  2010    SOCIAL HISTORY: Social History   Socioeconomic History   Marital status: Divorced    Spouse name: Not on file   Number of children: Not on file   Years of education: Not on file   Highest education level: Not on file  Occupational History   Not on file  Tobacco Use   Smoking status: Never   Smokeless tobacco: Never  Vaping Use   Vaping Use: Never used  Substance and Sexual Activity   Alcohol use: Not Currently   Drug use: No   Sexual activity: Not on file  Other Topics Concern   Not on file  Social History Narrative   Not on file   Social Determinants of Health   Financial Resource Strain: Not on file  Food Insecurity: Not on file  Transportation Needs: Not on file  Physical Activity: Not on file  Stress: Not on file  Social Connections: Not on file  Intimate Partner Violence: Not on file    FAMILY HISTORY:  No family history on file.  ALLERGIES:  has No Known Allergies.  MEDICATIONS:  Current Outpatient Medications  Medication Sig Dispense Refill   aspirin EC 81 MG tablet Take 81 mg by mouth daily.     atorvastatin (LIPITOR) 40 MG tablet Take 1 tablet (40 mg total) by mouth daily at 6 PM. 60 tablet 3   Calcium Carbonate-Vit D-Min (CALCIUM 1200 PO) Take 1 tablet by mouth daily after breakfast.     carvedilol (COREG) 6.25 MG tablet Take 3.125 mg by mouth 2 (two) times daily with a meal.      cholecalciferol (VITAMIN D)  1000 UNITS tablet Take 5,000 Units by mouth daily.      losartan (COZAAR) 25 MG tablet Take 25 mg by mouth daily after breakfast.      Multiple Vitamin (MULTIVITAMIN WITH MINERALS) TABS tablet Take 1 tablet by mouth daily.     nitroGLYCERIN (NITROSTAT) 0.4 MG SL tablet Place 1 tablet (0.4 mg total) under the tongue every 5 (five) minutes x 3 doses as needed for chest pain. 25 tablet 12   Polyethyl Glycol-Propyl Glycol (SYSTANE OP) Place 1 drop into both eyes at bedtime.     Testosterone 20.25 MG/ACT (1.62%) GEL APPLY 1 PUMP ONTO THE SKIN ONCE DAILY 75 g 0   white petrolatum (VASELINE) GEL Apply 1 application topically as needed for dry skin (itching).     No current facility-administered medications for this visit.   Facility-Administered Medications Ordered in Other Visits  Medication Dose Route Frequency Provider Last Rate Last Admin   influenza vaccine adjuvanted (FLUAD) injection 0.5 mL  0.5 mL Intramuscular Once Brunetta Genera, MD        REVIEW OF SYSTEMS:   10 Point review of Systems was done is negative except as noted above.  PHYSICAL EXAMINATION: ECOG PERFORMANCE STATUS: 2 - Symptomatic, <50% confined to bed  Vitals:   06/19/21 1150  BP: 109/85  Pulse: 67  Resp: 18  Temp: (!) 97.5 F (36.4 C)  SpO2: 96%   Filed Weights   06/19/21 1150  Weight: 236 lb 8 oz (107.3 kg)   .Body mass index is 31.2 kg/m.  NAD GENERAL:alert, in no acute distress and comfortable SKIN: no acute rashes, no significant lesions EYES: conjunctiva are pink and non-injected, sclera anicteric OROPHARYNX: MMM, no exudates, no oropharyngeal erythema or ulceration NECK: supple, no JVD LYMPH:  no palpable lymphadenopathy in the cervical, axillary or inguinal regions LUNGS: clear to auscultation b/l with normal respiratory effort HEART: regular rate & rhythm ABDOMEN:  normoactive bowel sounds , non tender, not distended. Extremity: no pedal edema PSYCH: alert & oriented x 3 with fluent  speech NEURO: no focal motor/sensory deficits  LABORATORY DATA:  I have reviewed the data as listed  .    Latest Ref Rng & Units 12/19/2021   11:36 AM 06/25/2021   11:25 AM 06/19/2021   11:19 AM  CBC  WBC 4.0 - 10.5 K/uL 4.2  5.2  4.7   Hemoglobin 13.0 - 17.0 g/dL 14.3  14.1  13.9   Hematocrit 39.0 - 52.0 % 42.1  41.8  40.5   Platelets 150 - 400 K/uL 150  126.0  156     .    Latest Ref Rng & Units 12/19/2021   11:36 AM 06/25/2021   11:25 AM 06/19/2021   11:19 AM  CMP  Glucose 70 - 99 mg/dL 97  100  132   BUN 8 - 23 mg/dL 16  14  12  Creatinine 0.61 - 1.24 mg/dL 1.18  1.07  1.10   Sodium 135 - 145 mmol/L 139  139  139   Potassium 3.5 - 5.1 mmol/L 4.4  4.3  4.1   Chloride 98 - 111 mmol/L 107  105  108   CO2 22 - 32 mmol/L '29  27  27   '$ Calcium 8.9 - 10.3 mg/dL 8.8  8.7  8.9   Total Protein 6.5 - 8.1 g/dL 7.1   6.7   Total Bilirubin 0.3 - 1.2 mg/dL 1.6   1.9   Alkaline Phos 38 - 126 U/L 45   47   AST 15 - 41 U/L 27   21   ALT 0 - 44 U/L 29   24    . Lab Results  Component Value Date   LDH 139 12/19/2021     01/05/18 Pathology:    RADIOGRAPHIC STUDIES: I have personally reviewed the radiological images as listed and agreed with the findings in the report. No results found.  ASSESSMENT & PLAN:   81 y.o. male with  1. Primary Testicular Diffuse Large B-Cell Lymphoma, Stage 1E 11/30/16 NM Myocar Multi w/spect w/wall motion which revealed a LV EF of 44%  12/08/17 US Scrotum revealed Abnormal appearance of the left testicle which is larger than the right and diffusely heterogeneous in echotexture. Cannot exclude infiltrating process/mass. Appearance is concerning for possible neoplasm.  01/05/18 Left testes biopsy revealed Primary Testicular Diffuse Large B-Cell Lymphoma   01/25/18 Hep B, Hep C and HIV negative  01/31/18 ECHO revealed a LV EF of 51%. Left ventricle: The cavity size was normal. Wall motion was normal; there were no regional wall motion  abnormalities. Atrial septum: No defect or patent foramen ovale was identified.  02/07/18 PET/CT revealed No findings for metabolically active lymphoma involving the neck, chest, abdomen/pelvis or osseous structures.  Due to admission after first intrathecal methotrexate, concern for brain toxicity given patient's age, and concern for treatment tolerance, decided to hold IT MTX. No further concerns for bleeding, nausea, or vomiting.  08/23/2018 PET scan revealing Mildly hypermetabolic mildly enlarged AP window lymph node, stable since 02/07/2018 PET-CT. Low level metabolism associated with new mild patchy consolidation in the medial segment right middle lobe, potentially Inflammatory. New mildly hypermetabolic right hilar lymph nodes, nonspecific, potentially reactive to the right middle lobe process. Nonspecific patchy hypermetabolism within the right testis is increased. No discrete right testicular mass on the noncontrast CT Images. These findings are equivocal for active lymphoma. Suggest attention on follow-up PET-CT in 3-6 months. Aortic Atherosclerosis (ICD10-I70.0).  Past Medical History:  Diagnosis Date   Arthritis    Coronary artery disease    HOH (hard of hearing)    Hyperlipidemia    Hypertension    Ischemic cardiomyopathy    Myocardial infarction ([place]) 2016   Quadriceps tendon rupture    LEFT    PLAN: Patient's labs done today were discussed in detail with him CBC CMP and LDH within normal limits Patient has no lab findings or clinical symptoms suggestive of recurrence/progression of his primary testicular large B-cell lymphoma at this time. -He is doing well overall and trying to maintain active lifestyle. -We discussed vaccine recommendations in detail We should see him back 1 more time for active surveillance in 6 months after which if there are no acute concerns we shall moved to once a year follow-ups  FOLLOW UP: RTC with Dr Irene Limbo with labs in 6 months  The total time  spent in the  appointment was 20 minutes*.  All of the patient's questions were answered with apparent satisfaction. The patient knows to call the clinic with any problems, questions or concerns.   Sullivan Lone MD MS AAHIVMS [name] Hematology/Oncology Physician [facility]  .*Total Encounter Time as defined by the Centers for Medicare and Medicaid Services includes, in addition to the face-to-face time of a patient visit (documented in the note above) non-face-to-face time: obtaining and reviewing outside history, ordering and reviewing medications, tests or procedures, care coordination (communications with other health care professionals or caregivers) and documentation in the medical record.

## 2021-12-30 ENCOUNTER — Ambulatory Visit: Payer: Medicare PPO | Admitting: Internal Medicine

## 2021-12-30 ENCOUNTER — Encounter: Payer: Self-pay | Admitting: Internal Medicine

## 2021-12-30 VITALS — BP 120/84 | HR 60 | Ht 73.0 in | Wt 238.6 lb

## 2021-12-30 DIAGNOSIS — E291 Testicular hypofunction: Secondary | ICD-10-CM | POA: Diagnosis not present

## 2021-12-30 MED ORDER — TESTOSTERONE 20.25 MG/ACT (1.62%) TD GEL: 1.0000 | Freq: Every day | TRANSDERMAL | 5 refills | Status: DC

## 2021-12-30 NOTE — Progress Notes (Signed)
Name: Carl [name]  MRN/ DOB: [id number], 1940/02/13    Age/ Sex: 81 y.o., male     PCP: [name], MD   Reason for Endocrinology Evaluation: Testicular radiotherapy/ Hypogonadism      Initial Endocrinology Clinic Visit: 04/11/2019    PATIENT IDENTIFIER: Carl [name] is a 81 y.o., male with a past medical history of CAD ( S/P DES 2016) and HTN . He has followed with [facility] since 04/11/2019 for consultative assistance with management of his Testicular radiotherapy  HISTORICAL SUMMARY:  Pt was diagnosed with testicular lymphoma in 01/2018. He is S/P chemo and scrotal Radiation therapy, he completed radiation therapy in 09/2018.    He was referred here to evaluate , monitor and treat for hypogonadism should it develop following radiation exposure.  Sees Dr. 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   Weight has been stable  Energy level stable  Nocturia x1  He has been taking testosterone 1 pump to one shoulder daily  Denies skin  rash  Denies SOB  or chest pain  Denies spontaneous erections       HISTORY:  Past Medical History:  Past Medical History:  Diagnosis Date   Arthritis    Coronary artery disease    HOH (hard of hearing)    Hyperlipidemia    Hypertension    Ischemic cardiomyopathy    Lymphoma ([place])    Myocardial infarction ([place]) 2016   Quadriceps tendon rupture    LEFT   Past Surgical History:  Past Surgical History:  Procedure Laterality Date   BIOPSY  04/27/2018   Procedure: BIOPSY;  Surgeon: Ronnette Juniper, MD;  Location: [facility];  Service: Gastroenterology;;   CARDIAC CATHETERIZATION N/A 10/15/2014   Procedure: Left Heart Cath and Coronary Angiography;  Surgeon: Charolette Forward,  MD;  Location: [facility];  Service: Cardiovascular;  Laterality: N/A;   COLONOSCOPY     ESOPHAGOGASTRODUODENOSCOPY (EGD) WITH PROPOFOL N/A 04/27/2018   Procedure: ESOPHAGOGASTRODUODENOSCOPY (EGD) WITH PROPOFOL;  Surgeon: Ronnette Juniper, MD;  [facility];  Service: Gastroenterology;  Laterality: N/A;   IR IMAGING GUIDED PORT INSERTION  03/14/2018   IR REMOVAL TUN ACCESS W/ PORT W/O FL MOD SED  01/22/2021   MICROLARYNGOSCOPY WITH CO2 LASER AND EXCISION OF VOCAL CORD LESION N/A 11/26/2017   Procedure: MICROLARYNGOSCOPY WITH CO2 LASER AND EXCISION OF VOCAL CORD LESION WITH JET VENTILATION;  Surgeon: Melida Quitter, MD;  Location: [facility];  Service: ENT;  Laterality: N/A;   QUADRICEPS TENDON REPAIR Left 02/20/2014   Procedure: LEFT REPAIR QUADRICEP TENDON;  Surgeon: Mauri Pole, MD;  [facility];  Service: Orthopedics;  Laterality: Left;   THROAT SURGERY  2010   Social History:  reports that he has quit smoking. His smoking use included cigarettes. He has never used smokeless tobacco. He reports current alcohol use. He reports that he does not use drugs. Family History:  Family History  Problem Relation Age of Onset   Cancer Mother    Heart attack Father    Breast cancer Sister    Breast cancer Sister    Breast cancer Sister    Breast cancer Sister    Heart attack Brother        2005  HOME MEDICATIONS: Allergies as of 12/30/2021   No Known Allergies      Medication List        Accurate as of December 30, 2021 11:50 AM. If you have any questions, ask your nurse or doctor.          aspirin EC 81 MG tablet Take 81 mg by mouth daily.   atorvastatin 40 MG tablet Commonly known as: LIPITOR Take 1 tablet (40 mg total) by mouth daily at 6 PM.   CALCIUM 1200 PO Take 1 tablet by mouth daily after breakfast.   carvedilol 6.25 MG tablet Commonly known as: COREG Take 3.125 mg by mouth 2 (two) times daily with a meal.   cholecalciferol 1000 units  tablet Commonly known as: VITAMIN D Take 5,000 Units by mouth daily.   losartan 25 MG tablet Commonly known as: COZAAR Take 25 mg by mouth daily after breakfast.   multivitamin with minerals Tabs tablet Take 1 tablet by mouth daily.   nitroGLYCERIN 0.4 MG SL tablet Commonly known as: NITROSTAT Place 1 tablet (0.4 mg total) under the tongue every 5 (five) minutes x 3 doses as needed for chest pain.   SYSTANE OP Place 1 drop into both eyes at bedtime.   Testosterone 20.25 MG/ACT (1.62%) Gel Apply 1 Pump topically daily.   white petrolatum Gel Commonly known as: VASELINE Apply 1 application topically as needed for dry skin (itching).          OBJECTIVE:   PHYSICAL EXAM: VS: BP 120/84 (BP Location: Right Arm, Patient Position: Sitting, Cuff Size: Normal)   Pulse 60   Ht '6\' 1"'$  (1.854 m)   Wt 238 lb 9.6 oz (108.2 kg)   SpO2 98%   BMI 31.48 kg/m    EXAM: General: Pt appears well and is in NAD  Lungs: Clear with good BS bilat with no rales, rhonchi, or wheezes  Heart: Auscultation: RRR.  Extremities:  BL LE: Trace pretibial edema   Mental Status: Judgment, insight: Intact Orientation: Oriented to time, place, and person Mood and affect: No depression, anxiety, or agitation     DATA REVIEWED:  Latest Reference Range & Units 12/19/21 11:36  Sodium 135 - 145 mmol/L 139  Potassium 3.5 - 5.1 mmol/L 4.4  Chloride 98 - 111 mmol/L 107  CO2 22 - 32 mmol/L 29  Glucose 70 - 99 mg/dL 97  BUN 8 - 23 mg/dL 16  Creatinine 0.61 - 1.24 mg/dL 1.18  Calcium 8.9 - 10.3 mg/dL 8.8 (L)  Anion gap 5 - 15  3 (L)  Alkaline Phosphatase 38 - 126 U/L 45  Albumin 3.5 - 5.0 g/dL 3.9  AST 15 - 41 U/L 27  ALT 0 - 44 U/L 29  Total Protein 6.5 - 8.1 g/dL 7.1  Total Bilirubin 0.3 - 1.2 mg/dL 1.6 (H)  GFR, Est Non African American >60 mL/min >60    Latest Reference Range & Units 12/19/21 11:36  WBC 4.0 - 10.5 K/uL 4.2  RBC 4.22 - 5.81 MIL/uL 4.37  Hemoglobin 13.0 - 17.0 g/dL 14.3  HCT  39.0 - 52.0 % 42.1  MCV 80.0 - 100.0 fL 96.3  MCH 26.0 - 34.0 pg 32.7  MCHC 30.0 - 36.0 g/dL 34.0  RDW 11.5 - 15.5 % 12.1  Platelets 150 - 400 K/uL 150  nRBC 0.0 - 0.2 % 0.0  Neutrophils % 51  Lymphocytes % 31  Monocytes Relative % 14  Eosinophil % 3  Basophil % 1  Immature Granulocytes %  0  NEUT# 1.7 - 7.7 K/uL 2.1  Lymphocyte # 0.7 - 4.0 K/uL 1.3  Monocyte # 0.1 - 1.0 K/uL 0.6  Eosinophils Absolute 0.0 - 0.5 K/uL 0.1  Basophils Absolute 0.0 - 0.1 K/uL 0.0  Abs Immature Granulocytes 0.00 - 0.07 K/uL 0.01   Testosterone pending   ASSESSMENT / PLAN / RECOMMENDATIONS:    1. Hypogonadism:  - He is tolerating testosterone gel without side effects - Testosterone goal for him given advanced age is low normal level. When he was on the 2 pumps a day his, testosterone was elevated at 653 ng/dL , we reduced it to 1 pump a day . - Part of his total testosterone is due to elevation of sex hormone binding globulin, will proceed with free testosterone check today  -CBC CMP and PSA are normal, no changes at this time pending testosterone results   Medication  Continue testosterone gel 1 pump to 1 shoulder daily ( 1 pump daily )      F/U in 6 months      Signed electronically by: Mack Guise, MD  [facility]  [facility] [address] [place], [place] [address] Phone: [phone] FAX: [phone]      CC: [name], [place] [address] Phone: [phone]  Fax: [phone]   Return to Endocrinology clinic as below: Future Appointments  Date Time Provider [place]  04/01/2022  9:30 AM Wardell Honour, MD [facility] None  06/19/2022 10:00 AM [facility] None  06/19/2022 10:30 AM Brunetta Genera, MD [facility] None

## 2022-01-05 LAB — TESTOSTERONE FREE MS/DIALYSIS
Free Testosterone, Serum: 29 pg/mL — ABNORMAL LOW
Testosterone, Serum (Total): 485 ng/dL
Testosterone-% Free: 0.6 %

## 2022-01-06 ENCOUNTER — Telehealth: Payer: Self-pay | Admitting: Internal Medicine

## 2022-01-06 MED ORDER — TESTOSTERONE 20.25 MG/ACT (1.62%) TD GEL: 1.0000 | Freq: Two times a day (BID) | TRANSDERMAL | 5 refills | Status: DC

## 2022-01-06 NOTE — Telephone Encounter (Signed)
LMTCB

## 2022-01-06 NOTE — Telephone Encounter (Signed)
Patient notified and will pick up new script

## 2022-01-06 NOTE — Telephone Encounter (Signed)
Please let him know that his testosterone is low and need to increase to 1 pump to each shoulder ( right now he is doing 1 pump to ONE shoulder)   Thanks

## 2022-01-29 DIAGNOSIS — R7303 Prediabetes: Secondary | ICD-10-CM | POA: Diagnosis not present

## 2022-01-29 DIAGNOSIS — I420 Dilated cardiomyopathy: Secondary | ICD-10-CM | POA: Diagnosis not present

## 2022-01-29 DIAGNOSIS — I10 Essential (primary) hypertension: Secondary | ICD-10-CM | POA: Diagnosis not present

## 2022-01-29 DIAGNOSIS — I2510 Atherosclerotic heart disease of native coronary artery without angina pectoris: Secondary | ICD-10-CM | POA: Diagnosis not present

## 2022-04-01 ENCOUNTER — Ambulatory Visit: Payer: Medicare PPO | Admitting: Family Medicine

## 2022-04-01 ENCOUNTER — Encounter: Payer: Self-pay | Admitting: Family Medicine

## 2022-04-01 VITALS — BP 118/70 | HR 61 | Temp 98.1°F | Ht 73.0 in | Wt 241.6 lb

## 2022-04-01 DIAGNOSIS — E7849 Other hyperlipidemia: Secondary | ICD-10-CM | POA: Diagnosis not present

## 2022-04-01 DIAGNOSIS — C8339 Diffuse large B-cell lymphoma, extranodal and solid organ sites: Secondary | ICD-10-CM

## 2022-04-01 DIAGNOSIS — E291 Testicular hypofunction: Secondary | ICD-10-CM

## 2022-04-01 DIAGNOSIS — I159 Secondary hypertension, unspecified: Secondary | ICD-10-CM

## 2022-04-01 DIAGNOSIS — C83398 Diffuse large b-cell lymphoma of other extranodal and solid organ sites: Secondary | ICD-10-CM

## 2022-04-01 NOTE — Progress Notes (Signed)
Provider:  Alain Honey, MD  Careteam: Patient Care Team: [name], MD as PCP - General (Family Medicine) [facility], [name], MD as Consulting Physician (Endocrinology)  PLACE OF SERVICE:  [place] Directive information    No Known Allergies  Chief Complaint  Patient presents with   Medical Management of Chronic Issues    Paitent presents today for a 6 month follow-up   Quality Metric Gaps    AWV, COVID#4     HPI: Patient is a 82 y.o. male .  Patient is here for medical management of chronic problems including aortic atherosclerosis, obesity, hypercholesterolemia and hypertension. Patient claims he is doing well.  He walks regularly.  He is having some issues with the right great toe as well as balance.  There have been no falls. Appetite is good weight is stable.  Sleeps okay.  [place] [facility] [place] that is coming up next month  Review of Systems:  Review of Systems  Constitutional: Negative.   HENT:  Positive for hearing loss.   Respiratory: Negative.    Cardiovascular: Negative.   Gastrointestinal: Negative.   Genitourinary: Negative.   Neurological: Negative.   All other systems reviewed and are negative.   Past Medical History:  Diagnosis Date   Arthritis    Coronary artery disease    HOH (hard of hearing)    Hyperlipidemia    Hypertension    Ischemic cardiomyopathy    Lymphoma ([place])    Myocardial infarction ([place]) 2016   Quadriceps tendon rupture    LEFT   Past Surgical History:  Procedure Laterality Date   BIOPSY  04/27/2018   Procedure: BIOPSY;  Surgeon: Ronnette Juniper, MD;  Location: [facility];  Service: Gastroenterology;;   CARDIAC CATHETERIZATION N/A 10/15/2014   Procedure: Left Heart Cath and Coronary Angiography;  Surgeon: Charolette Forward, MD;  Location: [place] [facility];  Service: Cardiovascular;  Laterality: N/A;   COLONOSCOPY     ESOPHAGOGASTRODUODENOSCOPY (EGD) WITH PROPOFOL N/A 04/27/2018    Procedure: ESOPHAGOGASTRODUODENOSCOPY (EGD) WITH PROPOFOL;  Surgeon: Ronnette Juniper, MD;  [facility];  Service: Gastroenterology;  Laterality: N/A;   IR IMAGING GUIDED PORT INSERTION  03/14/2018   IR REMOVAL TUN ACCESS W/ PORT W/O FL MOD SED  01/22/2021   MICROLARYNGOSCOPY WITH CO2 LASER AND EXCISION OF VOCAL CORD LESION N/A 11/26/2017   Procedure: MICROLARYNGOSCOPY WITH CO2 LASER AND EXCISION OF VOCAL CORD LESION WITH JET VENTILATION;  Surgeon: Melida Quitter, MD;  Location: [facility];  Service: ENT;  Laterality: N/A;   QUADRICEPS TENDON REPAIR Left 02/20/2014   Procedure: LEFT REPAIR QUADRICEP TENDON;  Surgeon: Mauri Pole, MD;  Location: [facility];  Service: Orthopedics;  Laterality: Left;   THROAT SURGERY  2010   Social History:   reports that he has quit smoking. His smoking use included cigarettes. He has never used smokeless tobacco. He reports current alcohol use. He reports that he does not use drugs.  Family History  Problem Relation Age of Onset   Cancer Mother    Heart attack Father    Breast cancer Sister    Breast cancer Sister    Breast cancer Sister    Breast cancer Sister    Heart attack Brother        2005    Medications: Patient's Medications  New Prescriptions   No medications on file  Previous Medications   ASPIRIN EC 81 MG TABLET    Take 81 mg by mouth  daily.   ATORVASTATIN (LIPITOR) 40 MG TABLET    Take 1 tablet (40 mg total) by mouth daily at 6 PM.   CALCIUM CARBONATE-VIT D-MIN (CALCIUM 1200 PO)    Take 1 tablet by mouth daily after breakfast.   CARVEDILOL (COREG) 6.25 MG TABLET    Take 3.125 mg by mouth 2 (two) times daily with a meal.    CHOLECALCIFEROL (VITAMIN D) 1000 UNITS TABLET    Take 5,000 Units by mouth daily.    LOSARTAN (COZAAR) 25 MG TABLET    Take 25 mg by mouth daily after breakfast.    MULTIPLE VITAMIN (MULTIVITAMIN WITH MINERALS) TABS TABLET    Take 1 tablet by mouth daily.   NITROGLYCERIN (NITROSTAT) 0.4 MG SL TABLET    Place 1 tablet  (0.4 mg total) under the tongue every 5 (five) minutes x 3 doses as needed for chest pain.   POLYETHYL GLYCOL-PROPYL GLYCOL (SYSTANE OP)    Place 1 drop into both eyes at bedtime.   TESTOSTERONE 20.25 MG/ACT (1.62%) GEL    Apply 1 Pump topically in the morning and at bedtime.   WHITE PETROLATUM (VASELINE) GEL    Apply 1 application topically as needed for dry skin (itching).  Modified Medications   No medications on file  Discontinued Medications   No medications on file    Physical Exam:  Vitals:   04/01/22 0904  BP: 118/70  Pulse: 61  Temp: 98.1 F (36.7 C)  SpO2: 96%  Weight: 241 lb 9.6 oz (109.6 kg)  Height: 6' 1"$  (1.854 m)   Body mass index is 31.88 kg/m. Wt Readings from Last 3 Encounters:  04/01/22 241 lb 9.6 oz (109.6 kg)  12/30/21 238 lb 9.6 oz (108.2 kg)  12/19/21 239 lb 1.6 oz (108.5 kg)    Physical Exam Vitals and nursing note reviewed.  Constitutional:      Appearance: Normal appearance.  Cardiovascular:     Rate and Rhythm: Normal rate and regular rhythm.  Pulmonary:     Effort: Pulmonary effort is normal.     Breath sounds: Normal breath sounds.  Abdominal:     General: Bowel sounds are normal.     Palpations: Abdomen is soft.  Musculoskeletal:     Comments: Right great toenail thickened.  Fungal infection  Neurological:     General: No focal deficit present.     Mental Status: He is alert and oriented to person, place, and time.  Psychiatric:        Mood and Affect: Mood normal.        Behavior: Behavior normal.     Labs reviewed: Basic Metabolic Panel: Recent Labs    06/19/21 1119 06/25/21 1125 12/19/21 1136  NA 139 139 139  K 4.1 4.3 4.4  CL 108 105 107  CO2 27 27 29  $ GLUCOSE 132* 100* 97  BUN 12 14 16  $ CREATININE 1.10 1.07 1.18  CALCIUM 8.9 8.7 8.8*  TSH  --  0.84  --    Liver Function Tests: Recent Labs    06/19/21 1119 12/19/21 1136  AST 21 27  ALT 24 29  ALKPHOS 47 45  BILITOT 1.9* 1.6*  PROT 6.7 7.1  ALBUMIN 3.8  3.9   No results for input(s): "LIPASE", "AMYLASE" in the last 8760 hours. No results for input(s): "AMMONIA" in the last 8760 hours. CBC: Recent Labs    06/19/21 1119 06/25/21 1125 12/19/21 1136  WBC 4.7 5.2 4.2  NEUTROABS 2.8  --  2.1  HGB 13.9 14.1 14.3  HCT 40.5 41.8 42.1  MCV 94.8 97.1 96.3  PLT 156 126.0* 150   Lipid Panel: Recent Labs    09/30/21 1122  CHOL 113  HDL 55  LDLCALC 45  TRIG 45  CHOLHDL 2.1   TSH: Recent Labs    06/25/21 1125  TSH 0.84   A1C: Lab Results  Component Value Date   HGBA1C 5.3 10/15/2014     Assessment/Plan  1. Other hyperlipidemia Lipids were assessed 6 months ago and are at target  2. Secondary hypertension Blood pressure is good at 118/74 on carvedilol, losartan  3. Hypogonadism in male He sees endocrine for hormone replacement, specifically testosterone.  4. 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  Was diagnosed with testicular lymphoma in 2019 now status post chemo and radiation   Alain Honey, MD [place] [phone]

## 2022-04-24 ENCOUNTER — Other Ambulatory Visit (HOSPITAL_COMMUNITY): Payer: Self-pay

## 2022-04-30 DIAGNOSIS — E785 Hyperlipidemia, unspecified: Secondary | ICD-10-CM | POA: Diagnosis not present

## 2022-04-30 DIAGNOSIS — I255 Ischemic cardiomyopathy: Secondary | ICD-10-CM | POA: Diagnosis not present

## 2022-04-30 DIAGNOSIS — I10 Essential (primary) hypertension: Secondary | ICD-10-CM | POA: Diagnosis not present

## 2022-04-30 DIAGNOSIS — I2510 Atherosclerotic heart disease of native coronary artery without angina pectoris: Secondary | ICD-10-CM | POA: Diagnosis not present

## 2022-05-26 ENCOUNTER — Other Ambulatory Visit (HOSPITAL_COMMUNITY): Payer: Self-pay

## 2022-05-26 ENCOUNTER — Telehealth: Payer: Self-pay

## 2022-05-26 NOTE — Telephone Encounter (Signed)
PA request received via CMM for Testosterone 20.25 MG/ACT(1.62%) gel  PA has been submitted to [facility] and is pending determination  Key: [id number]  *PA unable to process, test claim shows refill too soon.

## 2022-05-28 NOTE — Telephone Encounter (Signed)
Pa has been APPROVED until 02/09/23

## 2022-06-08 ENCOUNTER — Other Ambulatory Visit: Payer: Self-pay

## 2022-06-08 DIAGNOSIS — C8339 Diffuse large B-cell lymphoma, extranodal and solid organ sites: Secondary | ICD-10-CM

## 2022-06-19 ENCOUNTER — Other Ambulatory Visit: Payer: Self-pay

## 2022-06-19 ENCOUNTER — Inpatient Hospital Stay: Payer: Medicare PPO | Admitting: Hematology

## 2022-06-19 ENCOUNTER — Inpatient Hospital Stay: Payer: Medicare PPO | Attending: Hematology

## 2022-06-19 VITALS — BP 129/94 | HR 62 | Temp 97.3°F | Resp 17 | Ht 73.0 in | Wt 240.6 lb

## 2022-06-19 DIAGNOSIS — Z8249 Family history of ischemic heart disease and other diseases of the circulatory system: Secondary | ICD-10-CM | POA: Diagnosis not present

## 2022-06-19 DIAGNOSIS — Z803 Family history of malignant neoplasm of breast: Secondary | ICD-10-CM | POA: Diagnosis not present

## 2022-06-19 DIAGNOSIS — E785 Hyperlipidemia, unspecified: Secondary | ICD-10-CM | POA: Insufficient documentation

## 2022-06-19 DIAGNOSIS — Z87891 Personal history of nicotine dependence: Secondary | ICD-10-CM | POA: Diagnosis not present

## 2022-06-19 DIAGNOSIS — C8336 Diffuse large B-cell lymphoma, intrapelvic lymph nodes: Secondary | ICD-10-CM | POA: Insufficient documentation

## 2022-06-19 DIAGNOSIS — Z79899 Other long term (current) drug therapy: Secondary | ICD-10-CM | POA: Diagnosis not present

## 2022-06-19 DIAGNOSIS — Z809 Family history of malignant neoplasm, unspecified: Secondary | ICD-10-CM | POA: Insufficient documentation

## 2022-06-19 DIAGNOSIS — C8339 Diffuse large B-cell lymphoma, extranodal and solid organ sites: Secondary | ICD-10-CM | POA: Diagnosis not present

## 2022-06-19 DIAGNOSIS — I10 Essential (primary) hypertension: Secondary | ICD-10-CM | POA: Insufficient documentation

## 2022-06-19 LAB — CMP (CANCER CENTER ONLY)
ALT: 23 U/L (ref 0–44)
AST: 25 U/L (ref 15–41)
Albumin: 4 g/dL (ref 3.5–5.0)
Alkaline Phosphatase: 50 U/L (ref 38–126)
Anion gap: 5 (ref 5–15)
BUN: 25 mg/dL — ABNORMAL HIGH (ref 8–23)
CO2: 29 mmol/L (ref 22–32)
Calcium: 8.9 mg/dL (ref 8.9–10.3)
Chloride: 105 mmol/L (ref 98–111)
Creatinine: 1.42 mg/dL — ABNORMAL HIGH (ref 0.61–1.24)
GFR, Estimated: 49 mL/min — ABNORMAL LOW (ref >60)
Glucose, Bld: 96 mg/dL (ref 70–99)
Potassium: 4.6 mmol/L (ref 3.5–5.1)
Sodium: 139 mmol/L (ref 135–145)
Total Bilirubin: 1.5 mg/dL — ABNORMAL HIGH (ref 0.3–1.2)
Total Protein: 7 g/dL (ref 6.5–8.1)

## 2022-06-19 LAB — CBC WITH DIFFERENTIAL (CANCER CENTER ONLY)
Abs Immature Granulocytes: 0.02 10*3/uL (ref 0.00–0.07)
Basophils Absolute: 0 10*3/uL (ref 0.0–0.1)
Basophils Relative: 1 %
Eosinophils Absolute: 0.2 10*3/uL (ref 0.0–0.5)
Eosinophils Relative: 4 %
HCT: 45.3 % (ref 39.0–52.0)
Hemoglobin: 15.2 g/dL (ref 13.0–17.0)
Immature Granulocytes: 0 %
Lymphocytes Relative: 31 %
Lymphs Abs: 1.7 10*3/uL (ref 0.7–4.0)
MCH: 32.4 pg (ref 26.0–34.0)
MCHC: 33.6 g/dL (ref 30.0–36.0)
MCV: 96.6 fL (ref 80.0–100.0)
Monocytes Absolute: 0.8 10*3/uL (ref 0.1–1.0)
Monocytes Relative: 15 %
Neutro Abs: 2.7 10*3/uL (ref 1.7–7.7)
Neutrophils Relative %: 49 %
Platelet Count: 166 10*3/uL (ref 150–400)
RBC: 4.69 MIL/uL (ref 4.22–5.81)
RDW: 12.3 % (ref 11.5–15.5)
WBC Count: 5.4 10*3/uL (ref 4.0–10.5)
nRBC: 0 % (ref 0.0–0.2)

## 2022-06-19 LAB — LACTATE DEHYDROGENASE: LDH: 134 U/L (ref 98–192)

## 2022-06-19 NOTE — Progress Notes (Signed)
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 Patient was last seen by me on 12/19/2021 and was doing well overall with no new medical concerns.   Today, he reports that he has been feeling well overall since his last visit with [place] and he continues to stay active with engaging in golf. He also aims to walk daily. His energy levels have improved.   He complains of poor eye sight. He denies any fever, chills, night sweats, back pain, new lumps/bumps, new headaches, infection issues, or new medications.  Patient reports that he drinks about 45 oz daily. He drinks about 1 cup of coffee daily. He denies any issues passing urine or prostate problems.He is not taking any diuretics. Patient does not take any OTC pain mediations at this time.   MEDICAL HISTORY:  Past Medical History:  Diagnosis Date   Arthritis    Coronary artery disease    HOH (hard of hearing)    Hyperlipidemia    Hypertension    Ischemic cardiomyopathy    Lymphoma ([facility])    Myocardial infarction ([facility]) 2016   Quadriceps tendon rupture    LEFT    SURGICAL HISTORY: Past Surgical History:  Procedure Laterality Date   BIOPSY  04/27/2018   Procedure: BIOPSY;  Surgeon: Kerin Salen, MD;  [facility];  Service: Gastroenterology;;   CARDIAC CATHETERIZATION N/A 10/15/2014   Procedure: Left Heart Cath and Coronary Angiography;  Surgeon: Rinaldo Cloud, MD;  Location: [facility] INVASIVE [facility] LAB;   Service: Cardiovascular;  Laterality: N/A;   COLONOSCOPY     ESOPHAGOGASTRODUODENOSCOPY (EGD) WITH PROPOFOL N/A 04/27/2018   Procedure: ESOPHAGOGASTRODUODENOSCOPY (EGD) WITH PROPOFOL;  Surgeon: Kerin Salen, MD;  [facility];  Service: Gastroenterology;  Laterality: N/A;   IR IMAGING GUIDED PORT INSERTION  03/14/2018   IR REMOVAL TUN ACCESS W/ PORT W/O FL MOD SED  01/22/2021   MICROLARYNGOSCOPY WITH CO2 LASER AND EXCISION OF VOCAL CORD LESION N/A 11/26/2017   Procedure: MICROLARYNGOSCOPY WITH CO2 LASER AND EXCISION OF VOCAL CORD LESION WITH JET VENTILATION;  Surgeon: Christia Reading, MD;  Location: [facility];  Service: ENT;  Laterality: N/A;   QUADRICEPS TENDON REPAIR Left 02/20/2014   Procedure: LEFT REPAIR QUADRICEP TENDON;  Surgeon: Shelda Pal, MD;  [facility];  Service: Orthopedics;  Laterality: Left;   THROAT SURGERY  2010    SOCIAL HISTORY: Social History   Socioeconomic History   Marital status: Divorced    Spouse name: Not on file   Number of children: Not on file   Years of education: Not on file   Highest education level: Not on file  Occupational History   Not on file  Tobacco Use   Smoking status: Former    Types: Cigarettes   Smokeless tobacco: Never  Vaping Use   Vaping Use: Never used  Substance and Sexual Activity   Alcohol use: Yes    Comment: Once a week wine   Drug use: Never   Sexual activity: Not on file  Other Topics Concern   Not  on file  Social History Narrative   Not on file   Social Determinants of Health   Financial Resource Strain: Not on file  Food Insecurity: Not on file  Transportation Needs: No Transportation Needs (08/24/2018)   PRAPARE - Administrator, Civil Service (Medical): No    Lack of Transportation (Non-Medical): No  Physical Activity: Not on file  Stress: Not on file  Social Connections: Not on file  Intimate Partner Violence: Not At Risk (08/24/2018)   Humiliation, Afraid, Rape, and Kick questionnaire     Fear of Current or Ex-Partner: No    Emotionally Abused: No    Physically Abused: No    Sexually Abused: No    FAMILY HISTORY: Family History  Problem Relation Age of Onset   Cancer Mother    Heart attack Father    Breast cancer Sister    Breast cancer Sister    Breast cancer Sister    Breast cancer Sister    Heart attack Brother        2005    ALLERGIES:  has No Known Allergies.  MEDICATIONS:  Current Outpatient Medications  Medication Sig Dispense Refill   aspirin EC 81 MG tablet Take 81 mg by mouth daily.     atorvastatin (LIPITOR) 40 MG tablet Take 1 tablet (40 mg total) by mouth daily at 6 PM. 60 tablet 3   Calcium Carbonate-Vit D-Min (CALCIUM 1200 PO) Take 1 tablet by mouth daily after breakfast.     carvedilol (COREG) 6.25 MG tablet Take 3.125 mg by mouth 2 (two) times daily with a meal.      cholecalciferol (VITAMIN D) 1000 UNITS tablet Take 5,000 Units by mouth daily.      losartan (COZAAR) 25 MG tablet Take 25 mg by mouth daily after breakfast.      Multiple Vitamin (MULTIVITAMIN WITH MINERALS) TABS tablet Take 1 tablet by mouth daily.     nitroGLYCERIN (NITROSTAT) 0.4 MG SL tablet Place 1 tablet (0.4 mg total) under the tongue every 5 (five) minutes x 3 doses as needed for chest pain. 25 tablet 12   Polyethyl Glycol-Propyl Glycol (SYSTANE OP) Place 1 drop into both eyes at bedtime.     Testosterone 20.25 MG/ACT (1.62%) GEL Apply 1 Pump topically in the morning and at bedtime. 75 g 5   white petrolatum (VASELINE) GEL Apply 1 application topically as needed for dry skin (itching).     No current facility-administered medications for this visit.   Facility-Administered Medications Ordered in Other Visits  Medication Dose Route Frequency Provider Last Rate Last Admin   influenza vaccine adjuvanted (FLUAD) injection 0.5 mL  0.5 mL Intramuscular Once Johney Maine, MD        REVIEW OF SYSTEMS:    10 Point review of Systems was done is negative except as  noted above.   PHYSICAL EXAMINATION: ECOG PERFORMANCE STATUS: 2 - Symptomatic, <50% confined to bed  Vitals:   06/19/22 1017  BP: (!) 129/94  Pulse: 62  Resp: 17  Temp: (!) 97.3 F (36.3 C)  SpO2: 98%    Filed Weights   06/19/22 1017  Weight: 240 lb 9.6 oz (109.1 kg)    .Body mass index is 31.74 kg/m.    GENERAL:alert, in no acute distress and comfortable SKIN: no acute rashes, no significant lesions EYES: conjunctiva are pink and non-injected, sclera anicteric OROPHARYNX: MMM, no exudates, no oropharyngeal erythema or ulceration NECK: supple, no JVD LYMPH:  no palpable lymphadenopathy in the  cervical, axillary or inguinal regions LUNGS: clear to auscultation b/l with normal respiratory effort HEART: regular rate & rhythm ABDOMEN:  normoactive bowel sounds , non tender, not distended. Extremity: no pedal edema PSYCH: alert & oriented x 3 with fluent speech NEURO: no focal motor/sensory deficits   LABORATORY DATA:  I have reviewed the data as listed  .    Latest Ref Rng & Units 06/19/2022    9:34 AM 12/19/2021   11:36 AM 06/25/2021   11:25 AM  CBC  WBC 4.0 - 10.5 K/uL 5.4  4.2  5.2   Hemoglobin 13.0 - 17.0 g/dL 69.6  29.5  28.4   Hematocrit 39.0 - 52.0 % 45.3  42.1  41.8   Platelets 150 - 400 K/uL 166  150  126.0     .    Latest Ref Rng & Units 06/19/2022    9:34 AM 12/19/2021   11:36 AM 06/25/2021   11:25 AM  CMP  Glucose 70 - 99 mg/dL 96  97  132   BUN 8 - 23 mg/dL 25  16  14    Creatinine 0.61 - 1.24 mg/dL 4.40  1.02  7.25   Sodium 135 - 145 mmol/L 139  139  139   Potassium 3.5 - 5.1 mmol/L 4.6  4.4  4.3   Chloride 98 - 111 mmol/L 105  107  105   CO2 22 - 32 mmol/L 29  29  27    Calcium 8.9 - 10.3 mg/dL 8.9  8.8  8.7   Total Protein 6.5 - 8.1 g/dL 7.0  7.1    Total Bilirubin 0.3 - 1.2 mg/dL 1.5  1.6    Alkaline Phos 38 - 126 U/L 50  45    AST 15 - 41 U/L 25  27    ALT 0 - 44 U/L 23  29     . Lab Results  Component Value Date   LDH 134  06/19/2022     01/05/18 Pathology:    RADIOGRAPHIC STUDIES: I have personally reviewed the radiological images as listed and agreed with the findings in the report. No results found.  ASSESSMENT & PLAN:   82 y.o. male with  1. Primary Testicular Diffuse Large B-Cell Lymphoma, Stage 1E 11/30/16 NM Myocar Multi w/spect w/wall motion which revealed a LV EF of 44%  12/08/17 US Scrotum revealed Abnormal appearance of the left testicle which is larger than the right and diffusely heterogeneous in echotexture. Cannot exclude infiltrating process/mass. Appearance is concerning for possible neoplasm.  01/05/18 Left testes biopsy revealed Primary Testicular Diffuse Large B-Cell Lymphoma   01/25/18 Hep B, Hep C and HIV negative  01/31/18 ECHO revealed a LV EF of 51%. Left ventricle: The cavity size was normal. Wall motion was normal; there were no regional wall motion abnormalities. Atrial septum: No defect or patent foramen ovale was identified.  02/07/18 PET/CT revealed No findings for metabolically active lymphoma involving the neck, chest, abdomen/pelvis or osseous structures.  Due to admission after first intrathecal methotrexate, concern for brain toxicity given patient's age, and concern for treatment tolerance, decided to hold IT MTX. No further concerns for bleeding, nausea, or vomiting.  08/23/2018 PET scan revealing Mildly hypermetabolic mildly enlarged AP window lymph node, stable since 02/07/2018 PET-CT. Low level metabolism associated with new mild patchy consolidation in the medial segment right middle lobe, potentially Inflammatory. New mildly hypermetabolic right hilar lymph nodes, nonspecific, potentially reactive to the right middle lobe process. Nonspecific patchy hypermetabolism within the right testis is  increased. No discrete right testicular mass on the noncontrast CT Images. These findings are equivocal for active lymphoma. Suggest attention on follow-up PET-CT in 3-6  months. Aortic Atherosclerosis (ICD10-I70.0).  Past Medical History:  Diagnosis Date   Arthritis    Coronary artery disease    HOH (hard of hearing)    Hyperlipidemia    Hypertension    Ischemic cardiomyopathy    Lymphoma (HCC)    Myocardial infarction ([facility]CC) 2016   Quadriceps tendon rupture    LEFT    PLAN:  -Patient will be 5 years into remission at the end of this year -Discussed lab results on 06/19/2022 in detail with patient. CBC normal, showed WBC of 5.4K, hemoglobin improved to 15.2, and platelets of 166K. -CMP reveals mild dehydration -LDH normal -recommend patient to drink at least 64 ounces of water daily for optimal hydration levels  -Will extend clinical visits to once a year follow-ups as patient is nearly 5 years into remission -Patient regularly follows up with PCP annually -recommend patient to see PCP in 3 months to recheck kidney levels -contacted PCP to ensure patient's follow-up visit in 3 months -advised patient to be careful with taking any medications that could bother the kidneys such as certain OTC pain medications  FOLLOW UP: RTC with Dr Candise Che with labs in 12 months  The total time spent in the appointment was 20 minutes* .  All of the patient's questions were answered with apparent satisfaction. The patient knows to call the clinic with any problems, questions or concerns.   Wyvonnia Lora MD MS AAHIVMS [facility] Hematology/Oncology Physician [facility]  .*Total Encounter Time as defined by the Centers for Medicare and Medicaid Services includes, in addition to the face-to-face time of a patient visit (documented in the note above) non-face-to-face time: obtaining and reviewing outside history, ordering and reviewing medications, tests or procedures, care coordination (communications with other health care professionals or caregivers) and documentation in the medical record.    I,Mitra Faeizi,acting as a Neurosurgeon for Wyvonnia Lora, MD.,have  documented all relevant documentation on the behalf of Wyvonnia Lora, MD,as directed by  Wyvonnia Lora, MD while in the presence of Wyvonnia Lora, MD.  .I have reviewed the above documentation for accuracy and completeness, and I agree with the above. Johney Maine MD

## 2022-06-22 ENCOUNTER — Telehealth: Payer: Self-pay | Admitting: Hematology

## 2022-06-29 ENCOUNTER — Ambulatory Visit: Payer: Medicare PPO | Admitting: Internal Medicine

## 2022-06-29 ENCOUNTER — Encounter: Payer: Self-pay | Admitting: Internal Medicine

## 2022-06-29 VITALS — BP 122/78 | HR 71 | Ht 73.0 in | Wt 243.0 lb

## 2022-06-29 DIAGNOSIS — E291 Testicular hypofunction: Secondary | ICD-10-CM

## 2022-06-29 MED ORDER — TESTOSTERONE 20.25 MG/ACT (1.62%) TD GEL: 1.0000 | Freq: Two times a day (BID) | TRANSDERMAL | 5 refills | Status: DC

## 2022-06-29 NOTE — Progress Notes (Signed)
Name: Carl [name]  MRN/ DOB: [id number], 1940-06-24    Age/ Sex: 82 y.o., male     PCP: Daisy Floro, MD   Reason for Endocrinology Evaluation: Testicular radiotherapy/ Hypogonadism      Initial Endocrinology Clinic Visit: 04/11/2019    PATIENT IDENTIFIER: Mr. Carl [name] is a 82 y.o., male with a past medical history of CAD ( S/P DES 2016) and HTN . He has followed with [facility] since 04/11/2019 for consultative assistance with management of his Testicular radiotherapy  HISTORICAL SUMMARY:  Pt was diagnosed with testicular lymphoma in 01/2018. He is S/P chemo and scrotal Radiation therapy, he completed radiation therapy in 09/2018.    He was referred here to evaluate , monitor and treat for hypogonadism should it develop following radiation exposure.  Sees Dr. 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    He continues to follow up with Oncology 06/19/2022 with 5 yr remission    Energy level stable  Nocturia x1  Energy stable  Denies palpitations and SOB Has been out of  Testosterone for a week      HISTORY:  Past Medical History:  Past Medical History:  Diagnosis Date   Arthritis    Coronary artery disease    HOH (hard of hearing)    Hyperlipidemia    Hypertension    Ischemic cardiomyopathy    Lymphoma (HCC)    Myocardial infarction (HCC) 2016   Quadriceps tendon rupture    LEFT   Past Surgical History:  Past Surgical History:  Procedure Laterality Date   BIOPSY  04/27/2018   Procedure: BIOPSY;  Surgeon: Kerin Salen, MD;  Location: [facility];  Service: Gastroenterology;;   CARDIAC CATHETERIZATION N/A 10/15/2014   Procedure: Left Heart Cath and Coronary Angiography;  Surgeon: Rinaldo Cloud, MD;   Location: [facility] INVASIVE CV LAB;  Service: Cardiovascular;  Laterality: N/A;   COLONOSCOPY     ESOPHAGOGASTRODUODENOSCOPY (EGD) WITH PROPOFOL N/A 04/27/2018   Procedure: ESOPHAGOGASTRODUODENOSCOPY (EGD) WITH PROPOFOL;  Surgeon: Kerin Salen, MD;  Location: [facility];  Service: Gastroenterology;  Laterality: N/A;   IR IMAGING GUIDED PORT INSERTION  03/14/2018   IR REMOVAL TUN ACCESS W/ PORT W/O FL MOD SED  01/22/2021   MICROLARYNGOSCOPY WITH CO2 LASER AND EXCISION OF VOCAL CORD LESION N/A 11/26/2017   Procedure: MICROLARYNGOSCOPY WITH CO2 LASER AND EXCISION OF VOCAL CORD LESION WITH JET VENTILATION;  Surgeon: Christia Reading, MD;  Location: [facility] OR;  Service: ENT;  Laterality: N/A;   QUADRICEPS TENDON REPAIR Left 02/20/2014   Procedure: LEFT REPAIR QUADRICEP TENDON;  Surgeon: Shelda Pal, MD;  Location: [facility] ORS;  Service: Orthopedics;  Laterality: Left;   THROAT SURGERY  2010   Social History:  reports that he has quit smoking. His smoking use included cigarettes. He has never used smokeless tobacco. He reports current alcohol use. He reports that he does not use drugs. Family History:  Family History  Problem Relation Age of Onset   Cancer Mother    Heart attack Father    Breast cancer Sister    Breast cancer Sister    Breast cancer Sister    Breast cancer Sister    Heart attack Brother        2005  HOME MEDICATIONS: Allergies as of 06/29/2022   No Known Allergies      Medication List        Accurate as of Jun 29, 2022 11:48 AM. If you have any questions, ask your nurse or doctor.          aspirin EC 81 MG tablet Take 81 mg by mouth daily.   atorvastatin 40 MG tablet Commonly known as: LIPITOR Take 1 tablet (40 mg total) by mouth daily at 6 PM.   CALCIUM 1200 PO Take 1 tablet by mouth daily after breakfast.   carvedilol 6.25 MG tablet Commonly known as: COREG Take 3.125 mg by mouth 2 (two) times daily with a meal.   cholecalciferol 1000 units tablet Commonly known  as: VITAMIN D Take 5,000 Units by mouth daily.   losartan 25 MG tablet Commonly known as: COZAAR Take 25 mg by mouth daily after breakfast.   multivitamin with minerals Tabs tablet Take 1 tablet by mouth daily.   nitroGLYCERIN 0.4 MG SL tablet Commonly known as: NITROSTAT Place 1 tablet (0.4 mg total) under the tongue every 5 (five) minutes x 3 doses as needed for chest pain.   SYSTANE OP Place 1 drop into both eyes at bedtime.   Testosterone 20.25 MG/ACT (1.62%) Gel Apply 1 Pump topically in the morning and at bedtime.   white petrolatum Gel Commonly known as: VASELINE Apply 1 application topically as needed for dry skin (itching).          OBJECTIVE:   PHYSICAL EXAM: VS: BP 122/78 (BP Location: Left Arm, Patient Position: Sitting, Cuff Size: Large)   Pulse 71   Ht 6\' 1"  (1.854 m)   Wt 243 lb (110.2 kg)   SpO2 95%   BMI 32.06 kg/m    EXAM: General: Pt appears well and is in NAD  Lungs: Clear with good BS bilat   Heart: Auscultation: RRR.  Extremities:  BL LE: Trace pretibial edema   Mental Status: Judgment, insight: Intact Orientation: Oriented to time, place, and person Mood and affect: No depression, anxiety, or agitation     DATA REVIEWED:   Latest Reference Range & Units 06/19/22 09:34  Sodium 135 - 145 mmol/L 139  Potassium 3.5 - 5.1 mmol/L 4.6  Chloride 98 - 111 mmol/L 105  CO2 22 - 32 mmol/L 29  Glucose 70 - 99 mg/dL 96  BUN 8 - 23 mg/dL 25 (H)  Creatinine 1.61 - 1.24 mg/dL 0.96 (H)  Calcium 8.9 - 10.3 mg/dL 8.9  Anion gap 5 - 15  5  Alkaline Phosphatase 38 - 126 U/L 50  Albumin 3.5 - 5.0 g/dL 4.0  AST 15 - 41 U/L 25  ALT 0 - 44 U/L 23  Total Protein 6.5 - 8.1 g/dL 7.0  Total Bilirubin 0.3 - 1.2 mg/dL 1.5 (H)  GFR, Est Non African American >60 mL/min 49 (L)    Latest Reference Range & Units 06/19/22 09:34  WBC 4.0 - 10.5 K/uL 5.4  RBC 4.22 - 5.81 MIL/uL 4.69  Hemoglobin 13.0 - 17.0 g/dL 04.5  HCT 40.9 - 81.1 % 45.3  MCV 80.0 -  100.0 fL 96.6  MCH 26.0 - 34.0 pg 32.4  MCHC 30.0 - 36.0 g/dL 91.4  RDW 78.2 - 95.6 % 12.3  Platelets 150 - 400 K/uL 166  nRBC 0.0 - 0.2 % 0.0  Neutrophils % 49  Lymphocytes % 31  Monocytes Relative % 15  Eosinophil % 4  Basophil % 1  Immature Granulocytes %  0  NEUT# 1.7 - 7.7 K/uL 2.7  Lymphocyte # 0.7 - 4.0 K/uL 1.7  Monocyte # 0.1 - 1.0 K/uL 0.8  Eosinophils Absolute 0.0 - 0.5 K/uL 0.2  Basophils Absolute 0.0 - 0.1 K/uL 0.0  Abs Immature Granulocytes 0.00 - 0.07 K/uL 0.02    ASSESSMENT / PLAN / RECOMMENDATIONS:    1. Hypogonadism:   - Testosterone goal for him given advanced age is low normal level. When he was on the 2 pumps a day his, testosterone was elevated at 653 ng/dL , we reduced it to 1 pump a day , but this has been low so I increased it to 2 pumps a day in November 2023, unfortunately [facility] did not update his prescription, and he was unable to receive his last refill because he was told he finished the prescription too soon, he has been having multiple issues with different medications with [facility] and has changed his pharmacy to [facility] -I would not repeat his testosterone today, as he has been out of his testosterone for a week - Part of his total testosterone is due to elevation of sex hormone binding globulin, it is important to monitor his free testosterone -His most recent CBC and CMP have been normal  Medication  Continue testosterone gel 1 pump to each shoulder      F/U in 6 months      Signed electronically by: Lyndle Herrlich, MD  [facility]  [facility] [address] [place] [place], [place] [address] Phone: [phone] FAX: [phone]      CC: Daisy Floro, MD [address] [place] [address] Phone: [phone]  Fax: [phone]   Return to Endocrinology clinic as below: Future Appointments  Date Time Provider Department Center  09/30/2022 10:00 AM Frederica Kuster, MD  [facility] None  12/30/2022 10:10 AM Daisja Kessinger, [name], MD [facility] None  06/21/2023 10:00 AM [facility] None  06/21/2023 10:30 AM Johney Maine, MD [facility] None

## 2022-06-29 NOTE — Patient Instructions (Signed)
Take Testosterone 1 pump to each shoulder daily ( total of 2 pumps a day )

## 2022-07-16 DIAGNOSIS — I25119 Atherosclerotic heart disease of native coronary artery with unspecified angina pectoris: Secondary | ICD-10-CM | POA: Diagnosis not present

## 2022-07-16 DIAGNOSIS — E785 Hyperlipidemia, unspecified: Secondary | ICD-10-CM | POA: Diagnosis not present

## 2022-07-16 DIAGNOSIS — R32 Unspecified urinary incontinence: Secondary | ICD-10-CM | POA: Diagnosis not present

## 2022-07-16 DIAGNOSIS — N529 Male erectile dysfunction, unspecified: Secondary | ICD-10-CM | POA: Diagnosis not present

## 2022-07-16 DIAGNOSIS — E669 Obesity, unspecified: Secondary | ICD-10-CM | POA: Diagnosis not present

## 2022-07-16 DIAGNOSIS — I252 Old myocardial infarction: Secondary | ICD-10-CM | POA: Diagnosis not present

## 2022-07-16 DIAGNOSIS — I10 Essential (primary) hypertension: Secondary | ICD-10-CM | POA: Diagnosis not present

## 2022-07-16 DIAGNOSIS — R2681 Unsteadiness on feet: Secondary | ICD-10-CM | POA: Diagnosis not present

## 2022-07-16 DIAGNOSIS — E291 Testicular hypofunction: Secondary | ICD-10-CM | POA: Diagnosis not present

## 2022-07-28 DIAGNOSIS — H25813 Combined forms of age-related cataract, bilateral: Secondary | ICD-10-CM | POA: Diagnosis not present

## 2022-07-28 DIAGNOSIS — H35363 Drusen (degenerative) of macula, bilateral: Secondary | ICD-10-CM | POA: Diagnosis not present

## 2022-07-28 DIAGNOSIS — H40013 Open angle with borderline findings, low risk, bilateral: Secondary | ICD-10-CM | POA: Diagnosis not present

## 2022-07-28 DIAGNOSIS — H04123 Dry eye syndrome of bilateral lacrimal glands: Secondary | ICD-10-CM | POA: Diagnosis not present

## 2022-07-30 DIAGNOSIS — R7303 Prediabetes: Secondary | ICD-10-CM | POA: Diagnosis not present

## 2022-07-30 DIAGNOSIS — I2510 Atherosclerotic heart disease of native coronary artery without angina pectoris: Secondary | ICD-10-CM | POA: Diagnosis not present

## 2022-07-30 DIAGNOSIS — I10 Essential (primary) hypertension: Secondary | ICD-10-CM | POA: Diagnosis not present

## 2022-07-30 DIAGNOSIS — E782 Mixed hyperlipidemia: Secondary | ICD-10-CM | POA: Diagnosis not present

## 2022-07-31 DIAGNOSIS — E785 Hyperlipidemia, unspecified: Secondary | ICD-10-CM | POA: Diagnosis not present

## 2022-07-31 DIAGNOSIS — R7303 Prediabetes: Secondary | ICD-10-CM | POA: Diagnosis not present

## 2022-07-31 DIAGNOSIS — I10 Essential (primary) hypertension: Secondary | ICD-10-CM | POA: Diagnosis not present

## 2022-08-28 DIAGNOSIS — H25811 Combined forms of age-related cataract, right eye: Secondary | ICD-10-CM | POA: Diagnosis not present

## 2022-08-28 DIAGNOSIS — H25813 Combined forms of age-related cataract, bilateral: Secondary | ICD-10-CM | POA: Diagnosis not present

## 2022-09-09 ENCOUNTER — Encounter: Payer: Self-pay | Admitting: Internal Medicine

## 2022-09-09 ENCOUNTER — Ambulatory Visit: Payer: Medicare PPO | Admitting: Internal Medicine

## 2022-09-09 VITALS — BP 124/88 | HR 69 | Temp 98.6°F | Resp 17 | Ht 73.0 in | Wt 248.0 lb

## 2022-09-09 DIAGNOSIS — R0981 Nasal congestion: Secondary | ICD-10-CM

## 2022-09-09 DIAGNOSIS — E7849 Other hyperlipidemia: Secondary | ICD-10-CM

## 2022-09-09 DIAGNOSIS — U071 COVID-19: Secondary | ICD-10-CM

## 2022-09-09 DIAGNOSIS — R059 Cough, unspecified: Secondary | ICD-10-CM

## 2022-09-09 LAB — POC COVID19 BINAXNOW: SARS Coronavirus 2 Ag: POSITIVE — AB

## 2022-09-09 MED ORDER — NIRMATRELVIR/RITONAVIR (PAXLOVID)TABLET: 3.0000 | ORAL_TABLET | Freq: Two times a day (BID) | ORAL | 0 refills | Status: AC

## 2022-09-09 NOTE — Progress Notes (Signed)
3     Location:     Place of Service:     Provider:   Code Status:  Goals of Care:     09/09/2022    2:28 PM  Advanced Directives  Does Patient Have a Medical Advance Directive? Yes  Type of Estate agent of [place];Living will  Does patient want to make changes to medical advance directive? No - Patient declined  Copy of Healthcare Power of Attorney in Chart? No - copy requested     Chief Complaint  Patient presents with   Acute Visit    Patient states he has has took 2 covid test and one said negative and the other said positive. Patient states he has a cough, head congestion , and temp 100.5   Immunizations    Patient need shingles vaccine and updated covid vaccine    Quality Metric Gaps    Patient is due for AWV    HPI: Patient is a 82 y.o. 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, HLD Hypertension  Came with above complains    Past Medical History:  Diagnosis Date   Arthritis    Coronary artery disease    HOH (hard of hearing)    Hyperlipidemia    Hypertension    Ischemic cardiomyopathy    Lymphoma (HCC)    Myocardial infarction (HCC) 2016   Quadriceps tendon rupture    LEFT    Past Surgical History:  Procedure Laterality Date   BIOPSY  04/27/2018   Procedure: BIOPSY;  Surgeon: Kerin Salen, MD;  Location: [facility];  Service: Gastroenterology;;   CARDIAC CATHETERIZATION N/A 10/15/2014   Procedure: Left Heart Cath and Coronary Angiography;  Surgeon: Rinaldo Cloud, MD;  Location: [facility] INVASIVE CV LAB;  Service: Cardiovascular;  Laterality: N/A;   COLONOSCOPY     ESOPHAGOGASTRODUODENOSCOPY (EGD) WITH PROPOFOL N/A 04/27/2018   Procedure: ESOPHAGOGASTRODUODENOSCOPY (EGD) WITH PROPOFOL;  Surgeon: Kerin Salen, MD;  Location: [facility];  Service: Gastroenterology;  Laterality: N/A;   IR IMAGING GUIDED  PORT INSERTION  03/14/2018   IR REMOVAL TUN ACCESS W/ PORT W/O FL MOD SED  01/22/2021   MICROLARYNGOSCOPY WITH CO2 LASER AND EXCISION OF VOCAL CORD LESION N/A 11/26/2017   Procedure: MICROLARYNGOSCOPY WITH CO2 LASER AND EXCISION OF VOCAL CORD LESION WITH JET VENTILATION;  Surgeon: Christia Reading, MD;  Location: [facility] OR;  Service: ENT;  Laterality: N/A;   QUADRICEPS TENDON REPAIR Left 02/20/2014   Procedure: LEFT REPAIR QUADRICEP TENDON;  Surgeon: Shelda Pal, MD;  Location: [facility]S;  Service: Orthopedics;  Laterality: Left;   THROAT SURGERY  2010    No Known Allergies  Outpatient Encounter Medications as of 09/09/2022  Medication Sig   aspirin EC 81 MG tablet Take 81 mg by mouth daily.   atorvastatin (LIPITOR) 40 MG tablet Take 1 tablet (40 mg total) by mouth daily at 6 PM.   Calcium Carbonate-Vit D-Min (CALCIUM 1200 PO) Take 1 tablet by mouth daily after breakfast.   carvedilol (COREG) 6.25 MG tablet Take 3.125 mg by mouth 2 (two) times daily with a meal.    cholecalciferol (VITAMIN D) 1000 UNITS tablet Take 5,000 Units by mouth daily.    losartan (COZAAR) 25 MG tablet Take 25 mg by mouth daily after breakfast.    Multiple Vitamin (MULTIVITAMIN WITH MINERALS) TABS tablet Take 1 tablet by  mouth daily.   nitroGLYCERIN (NITROSTAT) 0.4 MG SL tablet Place 1 tablet (0.4 mg total) under the tongue every 5 (five) minutes x 3 doses as needed for chest pain.   Polyethyl Glycol-Propyl Glycol (SYSTANE OP) Place 1 drop into both eyes at bedtime.   Testosterone 20.25 MG/ACT (1.62%) GEL Apply 1 Pump topically in the morning and at bedtime.   white petrolatum (VASELINE) GEL Apply 1 application topically as needed for dry skin (itching).   Facility-Administered Encounter Medications as of 09/09/2022  Medication   influenza vaccine adjuvanted (FLUAD) injection 0.5 mL    Review of Systems:  Review of Systems  Constitutional:  Positive for fever. Negative for activity change, appetite change and unexpected  weight change.  HENT: Negative.    Respiratory:  Positive for cough. Negative for shortness of breath.   Cardiovascular:  Negative for leg swelling.  Gastrointestinal:  Negative for constipation.  Genitourinary:  Negative for frequency.  Musculoskeletal:  Positive for arthralgias. Negative for gait problem and myalgias.  Skin: Negative.  Negative for rash.  Neurological:  Positive for weakness. Negative for dizziness.  Psychiatric/Behavioral:  Negative for confusion and sleep disturbance.   All other systems reviewed and are negative.   Health Maintenance  Topic Date Due   Zoster Vaccines- Shingrix (2 of 2) 06/30/2019   Medicare Annual Wellness (AWV)  12/20/2020   COVID-19 Vaccine (7 - 2023-24 season) 01/13/2022   INFLUENZA VACCINE  09/10/2022   DTaP/Tdap/Td (4 - Td or Tdap) 04/18/2029   Pneumonia Vaccine 23+ Years old  Completed   HPV VACCINES  Aged Out    Physical Exam: Vitals:   09/09/22 1423  BP: 124/88  Pulse: 69  Resp: 17  Temp: 98.6 F (37 C)  TempSrc: Temporal  SpO2: 96%  Weight: 248 lb (112.5 kg)  Height: 6\' 1"  (1.854 m)   Body mass index is 32.72 kg/m. Physical Exam Vitals reviewed.  Constitutional:      Appearance: Normal appearance.  HENT:     Head: Normocephalic.     Nose: Nose normal.     Mouth/Throat:     Mouth: Mucous membranes are moist.     Pharynx: Oropharynx is clear.  Eyes:     Pupils: Pupils are equal, round, and reactive to light.  Cardiovascular:     Rate and Rhythm: Normal rate and regular rhythm.     Pulses: Normal pulses.     Heart sounds: No murmur heard. Pulmonary:     Effort: Pulmonary effort is normal. No respiratory distress.     Breath sounds: Normal breath sounds. No rales.  Abdominal:     General: Abdomen is flat. Bowel sounds are normal.     Palpations: Abdomen is soft.  Musculoskeletal:        General: No swelling.     Cervical back: Neck supple.  Skin:    General: Skin is warm.  Neurological:     General: No  focal deficit present.     Mental Status: He is alert and oriented to person, place, and time.  Psychiatric:        Mood and Affect: Mood normal.        Thought Content: Thought content normal.     Labs reviewed: Basic Metabolic Panel: Recent Labs    12/19/21 1136 06/19/22 0934  NA 139 139  K 4.4 4.6  CL 107 105  CO2 29 29  GLUCOSE 97 96  BUN 16 25*  CREATININE 1.18 1.42*  CALCIUM 8.8* 8.9  Liver Function Tests: Recent Labs    12/19/21 1136 06/19/22 0934  AST 27 25  ALT 29 23  ALKPHOS 45 50  BILITOT 1.6* 1.5*  PROT 7.1 7.0  ALBUMIN 3.9 4.0   No results for input(s): "LIPASE", "AMYLASE" in the last 8760 hours. No results for input(s): "AMMONIA" in the last 8760 hours. CBC: Recent Labs    12/19/21 1136 06/19/22 0934  WBC 4.2 5.4  NEUTROABS 2.1 2.7  HGB 14.3 15.2  HCT 42.1 45.3  MCV 96.3 96.6  PLT 150 166   Lipid Panel: Recent Labs    09/30/21 1122  CHOL 113  HDL 55  LDLCALC 45  TRIG 45  CHOLHDL 2.1   Lab Results  Component Value Date   HGBA1C 5.3 10/15/2014    Procedures since last visit: No results found.  Assessment/Plan 1. Cough, unspecified type  - POC COVID-19  2. Chronic nasal congestion  - POC COVID-19 3 Covid Positive  Paxlovid Do not take Atorvastatin while on Paxlovid Mucinex for cough Tylenol for fever   Labs/tests ordered:   Next appt:  10/14/2022

## 2022-09-09 NOTE — Patient Instructions (Signed)
Hold your Atorvastatin when on Paxlovid Go to ED if get SOB or high fever Take Tylenol for Fever  Mucinex PRN for Cough

## 2022-09-30 ENCOUNTER — Ambulatory Visit: Payer: Medicare PPO | Admitting: Family Medicine

## 2022-10-14 ENCOUNTER — Ambulatory Visit: Payer: Medicare PPO | Admitting: Sports Medicine

## 2022-10-14 ENCOUNTER — Encounter: Payer: Self-pay | Admitting: Sports Medicine

## 2022-10-14 VITALS — BP 130/80 | HR 61 | Temp 97.7°F | Resp 17 | Ht 73.0 in | Wt 250.0 lb

## 2022-10-14 DIAGNOSIS — E291 Testicular hypofunction: Secondary | ICD-10-CM

## 2022-10-14 DIAGNOSIS — I2510 Atherosclerotic heart disease of native coronary artery without angina pectoris: Secondary | ICD-10-CM

## 2022-10-14 DIAGNOSIS — I10 Essential (primary) hypertension: Secondary | ICD-10-CM

## 2022-10-14 DIAGNOSIS — N1831 Chronic kidney disease, stage 3a: Secondary | ICD-10-CM | POA: Diagnosis not present

## 2022-10-14 DIAGNOSIS — Z23 Encounter for immunization: Secondary | ICD-10-CM

## 2022-10-14 DIAGNOSIS — C8339 Diffuse large B-cell lymphoma, extranodal and solid organ sites: Secondary | ICD-10-CM | POA: Diagnosis not present

## 2022-10-14 NOTE — Progress Notes (Unsigned)
Careteam: Patient Care Team: Carl Sheffield, MD as PCP - General (Internal Medicine) [name], [name], MD as Consulting Physician (Endocrinology)  PLACE OF SERVICE:  [facility]  Advanced Directive information Does Patient Have a Medical Advance Directive?: Yes, Type of Advance Directive: Healthcare Power of [place];Living will, Does patient want to make changes to medical advance directive?: No - Patient declined  No Known Allergies  Chief Complaint  Patient presents with   Medical Management of Chronic Issues    Patient is being seen for 6 month follow up    Immunizations    Patient is due for covid vaccine      HPI: Patient is a 82 y.o. male is here for follow up  He lives alone, independent with ADLS and IADLS   Exercises 4 times/ week  He plays golf    Hypogonadism - follows  with endocrinology On testosterone injections  B cell lymphoma Follows with oncology, last seen in May /2024  Denies fevers, night sweats  Reports good appetite    Pt reports problems with his balance  No recent falls Denies pain  Ambulates independently    Review of Systems:  Review of Systems  Constitutional:  Negative for chills and fever.  HENT:  Negative for congestion and sore throat.   Eyes:  Negative for double vision.  Respiratory:  Negative for cough, sputum production and shortness of breath.   Cardiovascular:  Negative for chest pain, palpitations and leg swelling.  Gastrointestinal:  Negative for abdominal pain, heartburn and nausea.  Genitourinary:  Negative for dysuria, frequency and hematuria.  Musculoskeletal:  Negative for falls and myalgias.  Neurological:  Negative for dizziness, sensory change and focal weakness.    Past Medical History:  Diagnosis Date   Arthritis    Coronary artery disease    HOH (hard of hearing)    Hyperlipidemia    Hypertension    Ischemic cardiomyopathy    Lymphoma (HCC)    Myocardial infarction (HCC) 2016    Quadriceps tendon rupture    LEFT   Past Surgical History:  Procedure Laterality Date   BIOPSY  04/27/2018   Procedure: BIOPSY;  Surgeon: Kerin Salen, MD;  Location: [facility];  Service: Gastroenterology;;   CARDIAC CATHETERIZATION N/A 10/15/2014   Procedure: Left Heart Cath and Coronary Angiography;  Surgeon: Rinaldo Cloud, MD;  Location: [facility] INVASIVE CV LAB;  Service: Cardiovascular;  Laterality: N/A;   COLONOSCOPY     ESOPHAGOGASTRODUODENOSCOPY (EGD) WITH PROPOFOL N/A 04/27/2018   Procedure: ESOPHAGOGASTRODUODENOSCOPY (EGD) WITH PROPOFOL;  Surgeon: Kerin Salen, MD;  Location: [facility];  Service: Gastroenterology;  Laterality: N/A;   IR IMAGING GUIDED PORT INSERTION  03/14/2018   IR REMOVAL TUN ACCESS W/ PORT W/O FL MOD SED  01/22/2021   MICROLARYNGOSCOPY WITH CO2 LASER AND EXCISION OF VOCAL CORD LESION N/A 11/26/2017   Procedure: MICROLARYNGOSCOPY WITH CO2 LASER AND EXCISION OF VOCAL CORD LESION WITH JET VENTILATION;  Surgeon: Christia Reading, MD;  Location: [facility] OR;  Service: ENT;  Laterality: N/A;   QUADRICEPS TENDON REPAIR Left 02/20/2014   Procedure: LEFT REPAIR QUADRICEP TENDON;  Surgeon: Shelda Pal, MD;  Location: [facility]S;  Service: Orthopedics;  Laterality: Left;   THROAT SURGERY  2010   Social History:   reports that he has quit smoking. His smoking use included cigarettes. He has never used smokeless tobacco. He reports current alcohol use. He reports that he does not use drugs.  Family History  Problem Relation Age of Onset  Cancer Mother    Heart attack Father    Breast cancer Sister    Breast cancer Sister    Breast cancer Sister    Breast cancer Sister    Heart attack Brother        2005    Medications: Patient's Medications  New Prescriptions   No medications on file  Previous Medications   ASPIRIN EC 81 MG TABLET    Take 81 mg by mouth daily.   ATORVASTATIN (LIPITOR) 40 MG TABLET    Take 1 tablet (40 mg total) by mouth daily at 6 PM.   CALCIUM CARBONATE-VIT  D-MIN (CALCIUM 1200 PO)    Take 1 tablet by mouth daily after breakfast.   CARVEDILOL (COREG) 6.25 MG TABLET    Take 3.125 mg by mouth 2 (two) times daily with a meal.    CHOLECALCIFEROL (VITAMIN D) 1000 UNITS TABLET    Take 5,000 Units by mouth daily.    LOSARTAN (COZAAR) 25 MG TABLET    Take 25 mg by mouth daily after breakfast.    MULTIPLE VITAMIN (MULTIVITAMIN WITH MINERALS) TABS TABLET    Take 1 tablet by mouth daily.   NITROGLYCERIN (NITROSTAT) 0.4 MG SL TABLET    Place 1 tablet (0.4 mg total) under the tongue every 5 (five) minutes x 3 doses as needed for chest pain.   POLYETHYL GLYCOL-PROPYL GLYCOL (SYSTANE OP)    Place 1 drop into both eyes at bedtime.   TESTOSTERONE 20.25 MG/ACT (1.62%) GEL    Apply 1 Pump topically in the morning and at bedtime.   WHITE PETROLATUM (VASELINE) GEL    Apply 1 application topically as needed for dry skin (itching).  Modified Medications   No medications on file  Discontinued Medications   No medications on file    Physical Exam:  Vitals:   10/14/22 1343  BP: 130/80  Pulse: 61  Resp: 17  Temp: 97.7 F (36.5 C)  TempSrc: Temporal  SpO2: 96%  Weight: 250 lb (113.4 kg)  Height: 6\' 1"  (1.854 m)   Body mass index is 32.98 kg/m. Wt Readings from Last 3 Encounters:  10/14/22 250 lb (113.4 kg)  09/09/22 248 lb (112.5 kg)  06/29/22 243 lb (110.2 kg)    Physical Exam Constitutional:      Appearance: Normal appearance.  HENT:     Head: Normocephalic and atraumatic.  Cardiovascular:     Rate and Rhythm: Normal rate and regular rhythm.     Pulses: Normal pulses.     Heart sounds: Normal heart sounds.  Pulmonary:     Effort: No respiratory distress.     Breath sounds: No stridor. No wheezing or rales.  Abdominal:     General: Bowel sounds are normal. There is no distension.     Palpations: Abdomen is soft.     Tenderness: There is no abdominal tenderness. There is no right CVA tenderness or guarding.  Musculoskeletal:        General: No  swelling.  Neurological:     Mental Status: He is alert. Mental status is at baseline.     Sensory: No sensory deficit.     Motor: No weakness.   ***  Labs reviewed: Basic Metabolic Panel: Recent Labs    12/19/21 1136 06/19/22 0934  NA 139 139  K 4.4 4.6  CL 107 105  CO2 29 29  GLUCOSE 97 96  BUN 16 25*  CREATININE 1.18 1.42*  CALCIUM 8.8* 8.9   Liver Function Tests: Recent  Labs    12/19/21 1136 06/19/22 0934  AST 27 25  ALT 29 23  ALKPHOS 45 50  BILITOT 1.6* 1.5*  PROT 7.1 7.0  ALBUMIN 3.9 4.0   No results for input(s): "LIPASE", "AMYLASE" in the last 8760 hours. No results for input(s): "AMMONIA" in the last 8760 hours. CBC: Recent Labs    12/19/21 1136 06/19/22 0934  WBC 4.2 5.4  NEUTROABS 2.1 2.7  HGB 14.3 15.2  HCT 42.1 45.3  MCV 96.3 96.6  PLT 150 166   Lipid Panel: No results for input(s): "CHOL", "HDL", "LDLCALC", "TRIG", "CHOLHDL", "LDLDIRECT" in the last 8760 hours. TSH: No results for input(s): "TSH" in the last 8760 hours. A1C: Lab Results  Component Value Date   HGBA1C 5.3 10/15/2014     Assessment/Plan 1. Hypogonadism in male Follows with endo  On testosterone  2. Diffuse large B-cell lymphoma of solid organ excluding spleen (HCC) Follows with oncology - Basic Metabolic Panel with eGFR  3. Stage 3a chronic kidney disease (HCC) Avoid nephrotoxic meds Will check bmp - Basic Metabolic Panel with eGFR  4. Coronary artery disease involving native heart without angina pectoris, unspecified vessel or lesion type Denies chest pain  Cont with asp, statin   5. Primary hypertension At goal  Cont with losartan, coreg   No follow-ups on file.: 3 months

## 2022-10-15 LAB — BASIC METABOLIC PANEL WITH GFR
BUN/Creatinine Ratio: 18 (calc) (ref 6–22)
BUN: 24 mg/dL (ref 7–25)
CO2: 26 mmol/L (ref 20–32)
Calcium: 9 mg/dL (ref 8.6–10.3)
Chloride: 105 mmol/L (ref 98–110)
Creat: 1.35 mg/dL — ABNORMAL HIGH (ref 0.70–1.22)
Glucose, Bld: 80 mg/dL (ref 65–99)
Potassium: 4.7 mmol/L (ref 3.5–5.3)
Sodium: 140 mmol/L (ref 135–146)
eGFR: 52 mL/min/{1.73_m2} — ABNORMAL LOW (ref >=60)

## 2022-10-15 NOTE — Progress Notes (Deleted)
Plz call the patient and inform that kidney function improved from last check.

## 2022-10-29 DIAGNOSIS — I255 Ischemic cardiomyopathy: Secondary | ICD-10-CM | POA: Diagnosis not present

## 2022-10-29 DIAGNOSIS — E782 Mixed hyperlipidemia: Secondary | ICD-10-CM | POA: Diagnosis not present

## 2022-10-29 DIAGNOSIS — I2510 Atherosclerotic heart disease of native coronary artery without angina pectoris: Secondary | ICD-10-CM | POA: Diagnosis not present

## 2022-10-29 DIAGNOSIS — I10 Essential (primary) hypertension: Secondary | ICD-10-CM | POA: Diagnosis not present

## 2022-11-24 DIAGNOSIS — H2511 Age-related nuclear cataract, right eye: Secondary | ICD-10-CM | POA: Diagnosis not present

## 2022-11-24 DIAGNOSIS — H25811 Combined forms of age-related cataract, right eye: Secondary | ICD-10-CM | POA: Diagnosis not present

## 2022-11-24 DIAGNOSIS — H268 Other specified cataract: Secondary | ICD-10-CM | POA: Diagnosis not present

## 2022-12-14 DIAGNOSIS — H25812 Combined forms of age-related cataract, left eye: Secondary | ICD-10-CM | POA: Diagnosis not present

## 2022-12-22 DIAGNOSIS — H25812 Combined forms of age-related cataract, left eye: Secondary | ICD-10-CM | POA: Diagnosis not present

## 2022-12-22 DIAGNOSIS — H268 Other specified cataract: Secondary | ICD-10-CM | POA: Diagnosis not present

## 2022-12-22 DIAGNOSIS — H2512 Age-related nuclear cataract, left eye: Secondary | ICD-10-CM | POA: Diagnosis not present

## 2022-12-30 ENCOUNTER — Ambulatory Visit: Payer: Medicare PPO | Admitting: Internal Medicine

## 2022-12-30 ENCOUNTER — Encounter: Payer: Self-pay | Admitting: Internal Medicine

## 2022-12-30 VITALS — BP 120/80 | HR 74 | Ht 73.0 in | Wt 256.0 lb

## 2022-12-30 DIAGNOSIS — E291 Testicular hypofunction: Secondary | ICD-10-CM

## 2022-12-30 DIAGNOSIS — R972 Elevated prostate specific antigen [PSA]: Secondary | ICD-10-CM

## 2022-12-30 LAB — PSA: PSA: 4.91 ng/mL — ABNORMAL HIGH (ref 0.10–4.00)

## 2022-12-30 LAB — T4, FREE: Free T4: 0.87 ng/dL (ref 0.60–1.60)

## 2022-12-30 LAB — TSH: TSH: 1.32 u[IU]/mL (ref 0.35–5.50)

## 2022-12-30 NOTE — Progress Notes (Unsigned)
Name: Carl [name]  MRN/ DOB: [id number], 07-12-1940    Age/ Sex: 82 y.o., male     PCP: [name], MD   Reason for Endocrinology Evaluation: Testicular radiotherapy/ Hypogonadism      Initial Endocrinology Clinic Visit: 04/11/2019    PATIENT IDENTIFIER: Mr. Carl [name] is a 82 y.o., male with a past medical history of CAD ( S/P DES 2016) and HTN . He has followed with [facility] since 04/11/2019 for consultative assistance with management of his Testicular radiotherapy  HISTORICAL SUMMARY:  Pt was diagnosed with testicular lymphoma in 01/2018. He is S/P chemo and scrotal Radiation therapy, he completed radiation therapy in 09/2018.    He was referred here to evaluate , monitor and treat for hypogonadism should it develop following radiation exposure.  Sees Dr. Sharyn Lull (cardiology )   Pt was noted with low free testosterone x2 by 10/2019 and was started on testosterone replacement therapy    SUBJECTIVE:    Today (12/30/2022):  Carl [name] is here for follow up on hypogonadism secondary to testicular exposure to radiation.   Patient continues to be in remission from non-Hodgkin's lymphoma Patient has been noted weight gain, he also attributes more fatigue to weight gain He denies any chest pain or shortness of breath No lower extremity edema No skin rash with testosterone use Nocturia x1        HISTORY:  Past Medical History:  Past Medical History:  Diagnosis Date  . Arthritis   . Coronary artery disease   . HOH (hard of hearing)   . Hyperlipidemia   . Hypertension   . Ischemic cardiomyopathy   . Lymphoma (HCC)   . Myocardial infarction (HCC) 2016  . Quadriceps tendon rupture    LEFT   Past Surgical History:  Past Surgical History:  Procedure Laterality Date  . BIOPSY  04/27/2018   Procedure: BIOPSY;  Surgeon: Kerin Salen, MD;  Location: [facility];  Service: Gastroenterology;;  . CARDIAC CATHETERIZATION N/A  10/15/2014   Procedure: Left Heart Cath and Coronary Angiography;  Surgeon: Rinaldo Cloud, MD;  Location: [facility] INVASIVE CV LAB;  Service: Cardiovascular;  Laterality: N/A;  . COLONOSCOPY    . ESOPHAGOGASTRODUODENOSCOPY (EGD) WITH PROPOFOL N/A 04/27/2018   Procedure: ESOPHAGOGASTRODUODENOSCOPY (EGD) WITH PROPOFOL;  Surgeon: Kerin Salen, MD;  Location: [facility];  Service: Gastroenterology;  Laterality: N/A;  . IR IMAGING GUIDED PORT INSERTION  03/14/2018  . IR REMOVAL TUN ACCESS W/ PORT W/O FL MOD SED  01/22/2021  . MICROLARYNGOSCOPY WITH CO2 LASER AND EXCISION OF VOCAL CORD LESION N/A 11/26/2017   Procedure: MICROLARYNGOSCOPY WITH CO2 LASER AND EXCISION OF VOCAL CORD LESION WITH JET VENTILATION;  Surgeon: Christia Reading, MD;  [facility] OR;  Service: ENT;  Laterality: N/A;  . QUADRICEPS TENDON REPAIR Left 02/20/2014   Procedure: LEFT REPAIR QUADRICEP TENDON;  Surgeon: Shelda Pal, MD;  Location: [facility] ORS;  Service: Orthopedics;  Laterality: Left;  . THROAT SURGERY  2010   Social History:  reports that he has quit smoking. His smoking use included cigarettes. He has never used smokeless tobacco. He reports current alcohol use. He reports that he does not use drugs. Family History:  Family History  Problem Relation Age of Onset  . Cancer Mother   . Heart attack Father   . Breast cancer Sister   . Breast cancer Sister   . Breast cancer Sister   . Breast cancer Sister   . Heart attack Brother  2005     HOME MEDICATIONS: Allergies as of 12/30/2022   No Known Allergies      Medication List        Accurate as of December 30, 2022 10:19 AM. If you have any questions, ask your nurse or doctor.          aspirin EC 81 MG tablet Take 81 mg by mouth daily.   atorvastatin 40 MG tablet Commonly known as: LIPITOR Take 1 tablet (40 mg total) by mouth daily at 6 PM. What changed: additional instructions   CALCIUM 1200 PO Take 1 tablet by mouth daily after breakfast.    carvedilol 6.25 MG tablet Commonly known as: COREG Take 3.125 mg by mouth 2 (two) times daily with a meal.   cholecalciferol 1000 units tablet Commonly known as: VITAMIN D Take 5,000 Units by mouth daily.   losartan 25 MG tablet Commonly known as: COZAAR Take 25 mg by mouth daily after breakfast.   losartan 50 MG tablet Commonly known as: COZAAR Take by mouth.   multivitamin with minerals Tabs tablet Take 1 tablet by mouth daily.   nitroGLYCERIN 0.4 MG SL tablet Commonly known as: NITROSTAT Place 1 tablet (0.4 mg total) under the tongue every 5 (five) minutes x 3 doses as needed for chest pain.   SYSTANE OP Place 1 drop into both eyes at bedtime.   Testosterone 20.25 MG/ACT (1.62%) Gel Apply 1 Pump topically in the morning and at bedtime.   white petrolatum Gel Commonly known as: VASELINE Apply 1 application topically as needed for dry skin (itching).          OBJECTIVE:   PHYSICAL EXAM: VS: BP 120/80 (BP Location: Left Arm, Patient Position: Sitting, Cuff Size: Large)   Pulse 74   Ht 6\' 1"  (1.854 m)   Wt 256 lb (116.1 kg)   SpO2 95%   BMI 33.78 kg/m    EXAM: General: Pt appears well and is in NAD  Lungs: Clear with good BS bilat   Heart: Auscultation: RRR.  Extremities:  BL LE: Trace pretibial edema   Mental Status: Judgment, insight: Intact Orientation: Oriented to time, place, and person Mood and affect: No depression, anxiety, or agitation     DATA REVIEWED:  Latest Reference Range & Units 12/30/22 10:21  TSH 0.35 - 5.50 uIU/mL 1.32  T4,Free(Direct) 0.60 - 1.60 ng/dL 1.61  PSA 0.96 - 0.45 ng/mL 4.91 (H)  (H): Data is abnormally high   Latest Reference Range & Units 10/14/22 14:10  Sodium 135 - 146 mmol/L 140  Potassium 3.5 - 5.3 mmol/L 4.7  Chloride 98 - 110 mmol/L 105  CO2 20 - 32 mmol/L 26  Glucose 65 - 99 mg/dL 80  BUN 7 - 25 mg/dL 24  Creatinine 4.09 - 8.11 mg/dL 9.14 (H)  Calcium 8.6 - 10.3 mg/dL 9.0  BUN/Creatinine Ratio 6 - 22  (calc) 18  eGFR > OR = 60 mL/min/1.70m2 52 (L)    Latest Reference Range & Units 12/30/21 12:03  Testosterone-% Free % 0.6  Testosterone, Serum (Total) ng/dL 782  Free Testosterone, Serum pg/mL 29 (L)  (L): Data is abnormally low   ASSESSMENT / PLAN / RECOMMENDATIONS:    1. Hypogonadism:   - Testosterone goal for him given advanced age is low normal level.  week - Part of his total testosterone is due to elevation of sex hormone binding globulin, it is important to monitor his free testosterone - testosterone pending today   Medication  Continue testosterone  gel 1 pump BID     2 Elevated PSA:  -The patient will be referred to urology for further evaluation of this, patient is on testosterone therapy  F/U in 6 months      Signed electronically by: Lyndle Herrlich, MD  [facility] Endocrinology  [facility] [address] [place] [place], [place] [address] Phone: [phone] FAX: [phone]      CC: [name], MD [address] [place] [address] Phone: [phone]  Fax: [phone]   Return to Endocrinology clinic as below: Future Appointments  Date Time Provider Department Center  01/13/2023  1:40 PM [name], MD [facility] None  06/21/2023 10:00 AM [facility] None  06/21/2023 10:30 AM Johney Maine, MD [facility] None

## 2022-12-31 ENCOUNTER — Telehealth: Payer: Self-pay | Admitting: Internal Medicine

## 2022-12-31 DIAGNOSIS — R972 Elevated prostate specific antigen [PSA]: Secondary | ICD-10-CM | POA: Insufficient documentation

## 2022-12-31 MED ORDER — TESTOSTERONE 20.25 MG/ACT (1.62%) TD GEL: 1.0000 | Freq: Two times a day (BID) | TRANSDERMAL | 5 refills | Status: DC

## 2022-12-31 NOTE — Telephone Encounter (Signed)
LMTCB

## 2022-12-31 NOTE — Telephone Encounter (Signed)
Please let the patient know that his PSA, which is prostate test has increased and since he is on testosterone, I will need to send him to a urologist to evaluate this increase.  This could be as simple as inflammation but I cannot say for sure and this is why a urologist will be the best next  step   Thanks

## 2022-12-31 NOTE — Telephone Encounter (Signed)
Patient aware and will wait to hear from urology for scheduling

## 2023-01-13 ENCOUNTER — Encounter: Payer: Self-pay | Admitting: Sports Medicine

## 2023-01-13 ENCOUNTER — Ambulatory Visit: Payer: Medicare PPO | Admitting: Sports Medicine

## 2023-01-13 VITALS — BP 100/80 | HR 72 | Temp 96.7°F | Resp 17 | Ht 73.0 in | Wt 257.8 lb

## 2023-01-13 DIAGNOSIS — E291 Testicular hypofunction: Secondary | ICD-10-CM

## 2023-01-13 DIAGNOSIS — C83398 Diffuse large b-cell lymphoma of other extranodal and solid organ sites: Secondary | ICD-10-CM | POA: Diagnosis not present

## 2023-01-13 DIAGNOSIS — I10 Essential (primary) hypertension: Secondary | ICD-10-CM

## 2023-01-13 DIAGNOSIS — I959 Hypotension, unspecified: Secondary | ICD-10-CM | POA: Diagnosis not present

## 2023-01-13 DIAGNOSIS — E7849 Other hyperlipidemia: Secondary | ICD-10-CM

## 2023-01-13 DIAGNOSIS — N1832 Chronic kidney disease, stage 3b: Secondary | ICD-10-CM

## 2023-01-13 LAB — BASIC METABOLIC PANEL WITH GFR
BUN: 13 mg/dL (ref 7–25)
CO2: 30 mmol/L (ref 20–32)
Calcium: 8.7 mg/dL (ref 8.6–10.3)
Chloride: 104 mmol/L (ref 98–110)
Creat: 1.14 mg/dL (ref 0.70–1.22)
Glucose, Bld: 84 mg/dL (ref 65–99)
Potassium: 4.8 mmol/L (ref 3.5–5.3)
Sodium: 139 mmol/L (ref 135–146)
eGFR: 63 mL/min/{1.73_m2} (ref >=60)

## 2023-01-13 MED ORDER — LOSARTAN POTASSIUM 25 MG PO TABS: 25.0000 mg | ORAL_TABLET | Freq: Every day | ORAL | 1 refills | Status: AC

## 2023-01-13 NOTE — Progress Notes (Signed)
Careteam: Patient Care Team: Carl Sheffield, MD as PCP - General (Internal Medicine) [name], Carl Dolores, MD as Consulting Physician (Endocrinology)  PLACE OF SERVICE:  [facility]  Advanced Directive information Does Patient Have a Medical Advance Directive?: Yes, Type of Advance Directive: Healthcare Power of [place];Living will;Out of facility DNR (pink MOST or yellow form), Does patient want to make changes to medical advance directive?: No - Patient declined  No Known Allergies  Chief Complaint  Patient presents with   Medical Management of Chronic Issues    3 month follow up.   Immunizations    Discuss the need for Shingrix vaccine, and Covid Booster.   Health Maintenance    Discuss the need for AWV.     HPI: Patient is a 82 y.o. male is  here for follow up  Has no concers today  Hypotension  Bp running low  On coreg and losartan  Denies feeling dizzy or lightheaded    HLD  On lipitor  Denies muscle cramps   Hypogonadism  On testosterone  Follows with endocrinology  Walks 3 miles daily  Denies sob , orthopnea, PND, lower extremity swelling   CKD stage 3b   Lives alone Independent with ADLS , IADLS  Walks independently  No problems with her balance No recent falls Denies joint pains  B cell lymphoma  Follows with oncology  Denies night sweats  Reports good appetite   Review of Systems:  Review of Systems  Constitutional:  Negative for chills and fever.  HENT:  Negative for congestion and sore throat.   Eyes:  Negative for double vision.  Respiratory:  Negative for cough, sputum production and shortness of breath.   Cardiovascular:  Negative for chest pain, palpitations and leg swelling.  Gastrointestinal:  Negative for abdominal pain, heartburn and nausea.  Genitourinary:  Negative for dysuria, frequency and hematuria.  Musculoskeletal:  Negative for falls and myalgias.  Neurological:  Negative for dizziness, sensory change and  focal weakness.   Negative unless indicated in HPI.   Past Medical History:  Diagnosis Date   Arthritis    Coronary artery disease    HOH (hard of hearing)    Hyperlipidemia    Hypertension    Ischemic cardiomyopathy    Lymphoma (HCC)    Myocardial infarction (HCC) 2016   Quadriceps tendon rupture    LEFT   Past Surgical History:  Procedure Laterality Date   BIOPSY  04/27/2018   Procedure: BIOPSY;  Surgeon: Kerin Salen, MD;  Location: [facility];  Service: Gastroenterology;;   CARDIAC CATHETERIZATION N/A 10/15/2014   Procedure: Left Heart Cath and Coronary Angiography;  Surgeon: Rinaldo Cloud, MD;  Location: [facility] INVASIVE CV LAB;  Service: Cardiovascular;  Laterality: N/A;   COLONOSCOPY     ESOPHAGOGASTRODUODENOSCOPY (EGD) WITH PROPOFOL N/A 04/27/2018   Procedure: ESOPHAGOGASTRODUODENOSCOPY (EGD) WITH PROPOFOL;  Surgeon: Kerin Salen, MD;  Location: [facility];  Service: Gastroenterology;  Laterality: N/A;   IR IMAGING GUIDED PORT INSERTION  03/14/2018   IR REMOVAL TUN ACCESS W/ PORT W/O FL MOD SED  01/22/2021   MICROLARYNGOSCOPY WITH CO2 LASER AND EXCISION OF VOCAL CORD LESION N/A 11/26/2017   Procedure: MICROLARYNGOSCOPY WITH CO2 LASER AND EXCISION OF VOCAL CORD LESION WITH JET VENTILATION;  Surgeon: Christia Reading, MD;  Location: [facility] OR;  Service: ENT;  Laterality: N/A;   OTHER SURGICAL HISTORY     Cataract   QUADRICEPS TENDON REPAIR Left 02/20/2014   Procedure: LEFT REPAIR QUADRICEP TENDON;  Surgeon: Shelda Pal, MD;  Location: [facility]S;  Service: Orthopedics;  Laterality: Left;   THROAT SURGERY  2010   Social History:   reports that he has quit smoking. His smoking use included cigarettes. He has never used smokeless tobacco. He reports that he does not currently use alcohol. He reports that he does not use drugs.  Family History  Problem Relation Age of Onset   Cancer Mother    Heart attack Father    Breast cancer Sister    Breast cancer Sister    Breast cancer  Sister    Breast cancer Sister    Heart attack Brother        2005    Medications: Patient's Medications  New Prescriptions   No medications on file  Previous Medications   ASPIRIN EC 81 MG TABLET    Take 81 mg by mouth daily.   ATORVASTATIN (LIPITOR) 40 MG TABLET    Take 1 tablet (40 mg total) by mouth daily at 6 PM.   CALCIUM CARBONATE-VIT D-MIN (CALCIUM 1200 PO)    Take 1 tablet by mouth daily after breakfast.   CARVEDILOL (COREG) 6.25 MG TABLET    Take 3.125 mg by mouth 2 (two) times daily with a meal.    CHOLECALCIFEROL (VITAMIN D) 1000 UNITS TABLET    Take 5,000 Units by mouth daily.    LOSARTAN (COZAAR) 25 MG TABLET    Take 25 mg by mouth daily after breakfast.    LOSARTAN (COZAAR) 50 MG TABLET    Take by mouth.   MULTIPLE VITAMIN (MULTIVITAMIN WITH MINERALS) TABS TABLET    Take 1 tablet by mouth daily.   NITROGLYCERIN (NITROSTAT) 0.4 MG SL TABLET    Place 1 tablet (0.4 mg total) under the tongue every 5 (five) minutes x 3 doses as needed for chest pain.   POLYETHYL GLYCOL-PROPYL GLYCOL (SYSTANE OP)    Place 1 drop into both eyes at bedtime.   TESTOSTERONE 20.25 MG/ACT (1.62%) GEL    Apply 1 Pump topically in the morning and at bedtime.   WHITE PETROLATUM (VASELINE) GEL    Apply 1 application topically as needed for dry skin (itching).  Modified Medications   No medications on file  Discontinued Medications   No medications on file    Physical Exam: Vitals:   01/13/23 1326  BP: 100/80  Pulse: 72  Resp: 17  Temp: (!) 96.7 F (35.9 C)  SpO2: 98%  Weight: 257 lb 12.8 oz (116.9 kg)  Height: 6\' 1"  (1.854 m)   Body mass index is 34.01 kg/m. BP Readings from Last 3 Encounters:  01/13/23 100/80  12/30/22 120/80  10/14/22 130/80   Wt Readings from Last 3 Encounters:  01/13/23 257 lb 12.8 oz (116.9 kg)  12/30/22 256 lb (116.1 kg)  10/14/22 250 lb (113.4 kg)    Physical Exam Constitutional:      Appearance: Normal appearance.  HENT:     Head: Normocephalic and  atraumatic.  Cardiovascular:     Rate and Rhythm: Normal rate and regular rhythm.     Pulses: Normal pulses.     Heart sounds: Normal heart sounds.  Pulmonary:     Effort: No respiratory distress.     Breath sounds: No stridor. No wheezing or rales.  Abdominal:     General: Bowel sounds are normal. There is no distension.     Palpations: Abdomen is soft.     Tenderness: There is no abdominal tenderness. There is no right CVA tenderness or guarding.  Musculoskeletal:  General: No swelling.  Neurological:     Mental Status: He is alert. Mental status is at baseline.     Sensory: No sensory deficit.     Motor: No weakness.     Labs reviewed: Basic Metabolic Panel: Recent Labs    06/19/22 0934 10/14/22 1410 12/30/22 1021  NA 139 140  --   K 4.6 4.7  --   CL 105 105  --   CO2 29 26  --   GLUCOSE 96 80  --   BUN 25* 24  --   CREATININE 1.42* 1.35*  --   CALCIUM 8.9 9.0  --   TSH  --   --  1.32   Liver Function Tests: Recent Labs    06/19/22 0934  AST 25  ALT 23  ALKPHOS 50  BILITOT 1.5*  PROT 7.0  ALBUMIN 4.0   No results for input(s): "LIPASE", "AMYLASE" in the last 8760 hours. No results for input(s): "AMMONIA" in the last 8760 hours. CBC: Recent Labs    06/19/22 0934  WBC 5.4  NEUTROABS 2.7  HGB 15.2  HCT 45.3  MCV 96.6  PLT 166   Lipid Panel: No results for input(s): "CHOL", "HDL", "LDLCALC", "TRIG", "CHOLHDL", "LDLDIRECT" in the last 8760 hours. TSH: Recent Labs    12/30/22 1021  TSH 1.32   A1C: Lab Results  Component Value Date   HGBA1C 5.3 10/15/2014     Assessment/Plan 1. CKD stage 3b, GFR 30-44 ml/min (HCC) Avoid nephrotoxic meds Will check bmp today - Basic Metabolic Panel with eGFR  2. Hypogonadism in male On testosterone Follow up with endocrinology  3. Hypotension  Bp on the low side Pt denies being dizzy or lightheaded Will decrease losartan to 25 mg from 50 mg  - losartan (COZAAR) 25 MG tablet; Take 1 tablet (25  mg total) by mouth daily after breakfast.  Dispense: 90 tablet; Refill: 1  4. 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 Other hyperlipidemia Cont with lipitor

## 2023-01-28 DIAGNOSIS — E782 Mixed hyperlipidemia: Secondary | ICD-10-CM | POA: Diagnosis not present

## 2023-01-28 DIAGNOSIS — I10 Essential (primary) hypertension: Secondary | ICD-10-CM | POA: Diagnosis not present

## 2023-01-28 DIAGNOSIS — I255 Ischemic cardiomyopathy: Secondary | ICD-10-CM | POA: Diagnosis not present

## 2023-01-28 DIAGNOSIS — I2510 Atherosclerotic heart disease of native coronary artery without angina pectoris: Secondary | ICD-10-CM | POA: Diagnosis not present

## 2023-02-15 ENCOUNTER — Telehealth: Payer: Self-pay

## 2023-02-15 NOTE — Telephone Encounter (Signed)
 Patient aware.

## 2023-02-15 NOTE — Telephone Encounter (Signed)
 Pharmacy Patient Advocate Encounter  Received notification from [facility] that Prior Authorization for Testosterone gel has been APPROVED through 02/09/2024

## 2023-02-16 ENCOUNTER — Telehealth: Payer: Self-pay

## 2023-02-16 DIAGNOSIS — E291 Testicular hypofunction: Secondary | ICD-10-CM

## 2023-02-16 MED ORDER — TESTOSTERONE 20.25 MG/ACT (1.62%) TD GEL: 1.0000 | Freq: Two times a day (BID) | TRANSDERMAL | 5 refills | Status: DC

## 2023-02-16 NOTE — Telephone Encounter (Signed)
 Testosterone prescription needs to be resent with diagnosis code to [facility].

## 2023-02-23 ENCOUNTER — Encounter: Payer: Self-pay | Admitting: Family

## 2023-02-23 ENCOUNTER — Ambulatory Visit: Payer: Medicare PPO | Admitting: Family

## 2023-02-23 VITALS — BP 110/70 | HR 65 | Temp 97.6°F | Resp 19 | Ht 73.0 in | Wt 261.6 lb

## 2023-02-23 DIAGNOSIS — Z0000 Encounter for general adult medical examination without abnormal findings: Secondary | ICD-10-CM

## 2023-02-23 NOTE — Patient Instructions (Signed)
 Mr. Carl [name] , Thank you for taking time to come for your Medicare Wellness Visit. I appreciate your ongoing commitment to your health goals. Please review the following plan we discussed and let me know if I can assist you in the future.   Screening recommendations/referrals: Colonoscopy N/A  Recommended yearly ophthalmology/optometry visit for glaucoma screening and checkup Recommended yearly dental visit for hygiene and checkup  Vaccinations: Influenza vaccine due annually in September/October Pneumococcal vaccine : Up to date  Tdap vaccine : Up to date  Shingles vaccine : Up to date     Advanced directives: Yes   Conditions/risks identified: advanced age (>28men, >28 women);hypertension;obesity (BMI >30kg/m2);male gender  Next appointment: 1 year   Preventive Care 32 Years and Older, Male Preventive care refers to lifestyle choices and visits with your health care provider that can promote health and wellness. What does preventive care include? A yearly physical exam. This is also called an annual well check. Dental exams once or twice a year. Routine eye exams. Ask your health care provider how often you should have your eyes checked. Personal lifestyle choices, including: Daily care of your teeth and gums. Regular physical activity. Eating a healthy diet. Avoiding tobacco and drug use. Limiting alcohol use. Practicing safe sex. Taking low doses of aspirin  every day. Taking vitamin and mineral supplements as recommended by your health care provider. What happens during an annual well check? The services and screenings done by your health care provider during your annual well check will depend on your age, overall health, lifestyle risk factors, and family history of disease. Counseling  Your health care provider may ask you questions about your: Alcohol use. Tobacco use. Drug use. Emotional well-being. Home and relationship well-being. Sexual activity. Eating  habits. History of falls. Memory and ability to understand (cognition). Work and work astronomer. Screening  You may have the following tests or measurements: Height, weight, and BMI. Blood pressure. Lipid and cholesterol levels. These may be checked every 5 years, or more frequently if you are over 2 years old. Skin check. Lung cancer screening. You may have this screening every year starting at age 49 if you have a 30-pack-year history of smoking and currently smoke or have quit within the past 15 years. Fecal occult blood test (FOBT) of the stool. You may have this test every year starting at age 11. Flexible sigmoidoscopy or colonoscopy. You may have a sigmoidoscopy every 5 years or a colonoscopy every 10 years starting at age 31. Prostate cancer screening. Recommendations will vary depending on your family history and other risks. Hepatitis C blood test. Hepatitis B blood test. Sexually transmitted disease (STD) testing. Diabetes screening. This is done by checking your blood sugar (glucose) after you have not eaten for a while (fasting). You may have this done every 1-3 years. Abdominal aortic aneurysm (AAA) screening. You may need this if you are a current or former smoker. Osteoporosis. You may be screened starting at age 74 if you are at high risk. Talk with your health care provider about your test results, treatment options, and if necessary, the need for more tests. Vaccines  Your health care provider may recommend certain vaccines, such as: Influenza vaccine. This is recommended every year. Tetanus, diphtheria, and acellular pertussis (Tdap, Td) vaccine. You may need a Td booster every 10 years. Zoster vaccine. You may need this after age 61. Pneumococcal 13-valent conjugate (PCV13) vaccine. One dose is recommended after age 56. Pneumococcal polysaccharide (PPSV23) vaccine. One dose is recommended after  age 58. Talk to your health care provider about which screenings and  vaccines you need and how often you need them. This information is not intended to replace advice given to you by your health care provider. Make sure you discuss any questions you have with your health care provider. Document Released: 02/22/2015 Document Revised: 10/16/2015 Document Reviewed: 11/27/2014 Elsevier Interactive Patient Education  2017 [employer].  Fall Prevention in the Home Falls can cause injuries. They can happen to people of all ages. There are many things you can do to make your home safe and to help prevent falls. What can I do on the outside of my home? Regularly fix the edges of walkways and driveways and fix any cracks. Remove anything that might make you trip as you walk through a door, such as a raised step or threshold. Trim any bushes or trees on the path to your home. Use bright outdoor lighting. Clear any walking paths of anything that might make someone trip, such as rocks or tools. Regularly check to see if handrails are loose or broken. Make sure that both sides of any steps have handrails. Any raised decks and porches should have guardrails on the edges. Have any leaves, snow, or ice cleared regularly. Use sand or salt on walking paths during winter. Clean up any spills in your garage right away. This includes oil or grease spills. What can I do in the bathroom? Use night lights. Install grab bars by the toilet and in the tub and shower. Do not use towel bars as grab bars. Use non-skid mats or decals in the tub or shower. If you need to sit down in the shower, use a plastic, non-slip stool. Keep the floor dry. Clean up any water that spills on the floor as soon as it happens. Remove soap buildup in the tub or shower regularly. Attach bath mats securely with double-sided non-slip rug tape. Do not have throw rugs and other things on the floor that can make you trip. What can I do in the bedroom? Use night lights. Make sure that you have a light by your  bed that is easy to reach. Do not use any sheets or blankets that are too big for your bed. They should not hang down onto the floor. Have a firm chair that has side arms. You can use this for support while you get dressed. Do not have throw rugs and other things on the floor that can make you trip. What can I do in the kitchen? Clean up any spills right away. Avoid walking on wet floors. Keep items that you use a lot in easy-to-reach places. If you need to reach something above you, use a strong step stool that has a grab bar. Keep electrical cords out of the way. Do not use floor polish or wax that makes floors slippery. If you must use wax, use non-skid floor wax. Do not have throw rugs and other things on the floor that can make you trip. What can I do with my stairs? Do not leave any items on the stairs. Make sure that there are handrails on both sides of the stairs and use them. Fix handrails that are broken or loose. Make sure that handrails are as long as the stairways. Check any carpeting to make sure that it is firmly attached to the stairs. Fix any carpet that is loose or worn. Avoid having throw rugs at the top or bottom of the stairs. If you do  have throw rugs, attach them to the floor with carpet tape. Make sure that you have a light switch at the top of the stairs and the bottom of the stairs. If you do not have them, ask someone to add them for you. What else can I do to help prevent falls? Wear shoes that: Do not have high heels. Have rubber bottoms. Are comfortable and fit you well. Are closed at the toe. Do not wear sandals. If you use a stepladder: Make sure that it is fully opened. Do not climb a closed stepladder. Make sure that both sides of the stepladder are locked into place. Ask someone to hold it for you, if possible. Clearly mark and make sure that you can see: Any grab bars or handrails. First and last steps. Where the edge of each step is. Use tools that  help you move around (mobility aids) if they are needed. These include: Canes. Walkers. Scooters. Crutches. Turn on the lights when you go into a dark area. Replace any light bulbs as soon as they burn out. Set up your furniture so you have a clear path. Avoid moving your furniture around. If any of your floors are uneven, fix them. If there are any pets around you, be aware of where they are. Review your medicines with your doctor. Some medicines can make you feel dizzy. This can increase your chance of falling. Ask your doctor what other things that you can do to help prevent falls. This information is not intended to replace advice given to you by your health care provider. Make sure you discuss any questions you have with your health care provider. Document Released: 11/22/2008 Document Revised: 07/04/2015 Document Reviewed: 03/02/2014 Elsevier Interactive Patient Education  2017 [employer].

## 2023-02-23 NOTE — Progress Notes (Signed)
 Subjective:   Carl [name] is a 83 y.o. male who presents for Medicare Annual/Subsequent preventive examination.  Visit Complete: In person  Patient Medicare AWV questionnaire was completed by the patient on 02/23/2023; I have confirmed that all information answered by patient is correct and no changes since this date.  Cardiac Risk Factors include: advanced age (>59men, >85 women);hypertension;obesity (BMI >30kg/m2);male gender     Objective:    Today's Vitals   02/23/23 1329  BP: 110/70  Pulse: 65  Resp: 19  Temp: 97.6 F (36.4 C)  SpO2: 97%  Weight: 261 lb 9.6 oz (118.7 kg)  Height: 6' 1 (1.854 m)   Body mass index is 34.51 kg/m.     02/23/2023    1:12 PM 01/13/2023    1:35 PM 10/14/2022    1:42 PM 09/09/2022    2:28 PM 09/30/2021   10:17 AM 12/26/2018   12:56 PM 10/21/2018   10:05 AM  Advanced Directives  Does Patient Have a Medical Advance Directive? Yes Yes Yes Yes Yes Yes Yes  Type of Estate Agent of [place];Living will;Out of facility DNR (pink MOST or yellow form) Healthcare Power of [place];Living will;Out of facility DNR (pink MOST or yellow form) Healthcare Power of [place];Living will Healthcare Power of [place];Living will Healthcare Power of [place];Living will Healthcare Power of [place];Living will Healthcare Power of [place];Living will  Does patient want to make changes to medical advance directive? No - Patient declined No - Patient declined No - Patient declined No - Patient declined No - Patient declined    Copy of Healthcare Power of Attorney in Chart? No - copy requested No - copy requested No - copy requested No - copy requested   No - copy requested    Current Medications (verified) Outpatient Encounter Medications as of 02/23/2023  Medication Sig   aspirin  EC 81 MG tablet Take 81 mg by mouth daily.   atorvastatin  (LIPITOR) 40 MG tablet Take 1 tablet (40 mg total) by mouth daily at 6 PM.   Calcium  Carbonate-Vit  D-Min (CALCIUM  1200 PO) Take 1 tablet by mouth daily after breakfast.   carvedilol  (COREG ) 6.25 MG tablet Take 3.125 mg by mouth 2 (two) times daily with a meal.    cholecalciferol (VITAMIN D) 1000 UNITS tablet Take 5,000 Units by mouth daily.    losartan  (COZAAR ) 25 MG tablet Take 1 tablet (25 mg total) by mouth daily after breakfast.   Multiple Vitamin (MULTIVITAMIN WITH MINERALS) TABS tablet Take 1 tablet by mouth daily.   Polyethyl Glycol-Propyl Glycol (SYSTANE OP) Place 1 drop into both eyes at bedtime.   Testosterone  20.25 MG/ACT (1.62%) GEL Apply 1 Pump topically in the morning and at bedtime.   white petrolatum (VASELINE) GEL Apply 1 application topically as needed for dry skin (itching).   nitroGLYCERIN  (NITROSTAT ) 0.4 MG SL tablet Place 1 tablet (0.4 mg total) under the tongue every 5 (five) minutes x 3 doses as needed for chest pain. (Patient not taking: Reported on 02/23/2023)   Facility-Administered Encounter Medications as of 02/23/2023  Medication   influenza vaccine adjuvanted (FLUAD) injection 0.5 mL    Allergies (verified) Patient has no known allergies.   History: Past Medical History:  Diagnosis Date   Arthritis    Coronary artery disease    HOH (hard of hearing)    Hyperlipidemia    Hypertension    Ischemic cardiomyopathy    Lymphoma (HCC)    Myocardial infarction (HCC) 2016   Quadriceps tendon rupture  LEFT   Past Surgical History:  Procedure Laterality Date   BIOPSY  04/27/2018   Procedure: BIOPSY;  Surgeon: Saintclair Jasper, MD;  Location: [facility] ENDOSCOPY;  Service: Gastroenterology;;   CARDIAC CATHETERIZATION N/A 10/15/2014   Procedure: Left Heart Cath and Coronary Angiography;  Surgeon: Rober Chroman, MD;  Location: [facility] INVASIVE CV LAB;  Service: Cardiovascular;  Laterality: N/A;   COLONOSCOPY     ESOPHAGOGASTRODUODENOSCOPY (EGD) WITH PROPOFOL  N/A 04/27/2018   Procedure: ESOPHAGOGASTRODUODENOSCOPY (EGD) WITH PROPOFOL ;  Surgeon: Saintclair Jasper, MD;  Location: [facility]  ENDOSCOPY;  Service: Gastroenterology;  Laterality: N/A;   IR IMAGING GUIDED PORT INSERTION  03/14/2018   IR REMOVAL TUN ACCESS W/ PORT W/O FL MOD SED  01/22/2021   MICROLARYNGOSCOPY WITH CO2 LASER AND EXCISION OF VOCAL CORD LESION N/A 11/26/2017   Procedure: MICROLARYNGOSCOPY WITH CO2 LASER AND EXCISION OF VOCAL CORD LESION WITH JET VENTILATION;  Surgeon: Carlie Clark, MD;  Location[facility];  Service: ENT;  Laterality: N/A;   OTHER SURGICAL HISTORY     Cataract   QUADRICEPS TENDON REPAIR Left 02/20/2014   Procedure: LEFT REPAIR QUADRICEP TENDON;  Surgeon: Donnice JONETTA Car, MD;  Location: [facility] ORS;  Service: Orthopedics;  Laterality: Left;   THROAT SURGERY  2010   Family History  Problem Relation Age of Onset   Cancer Mother    Heart attack Father    Breast cancer Sister    Breast cancer Sister    Breast cancer Sister    Breast cancer Sister    Heart attack Brother        2005   Social History   Socioeconomic History   Marital status: Divorced    Spouse name: Not on file   Number of children: Not on file   Years of education: Not on file   Highest education level: Not on file  Occupational History   Not on file  Tobacco Use   Smoking status: Former    Types: Cigarettes   Smokeless tobacco: Never  Vaping Use   Vaping status: Never Used  Substance and Sexual Activity   Alcohol use: Not Currently    Comment: Once a week wine   Drug use: Never   Sexual activity: Not on file  Other Topics Concern   Not on file  Social History Narrative   Not on file   Social Drivers of Health   Financial Resource Strain: Not on file  Food Insecurity: Not on file  Transportation Needs: No Transportation Needs (08/24/2018)   PRAPARE - Administrator, Civil Service (Medical): No    Lack of Transportation (Non-Medical): No  Physical Activity: Not on file  Stress: Not on file  Social Connections: Not on file    Tobacco Counseling Counseling given: Not Answered   Clinical  Intake:  Pre-visit preparation completed: No  Pain : No/denies pain     BMI - recorded: 34.51 Nutritional Status: BMI > 30  Obese Nutritional Risks: None Diabetes: No  How often do you need to have someone help you when you read instructions, pamphlets, or other written materials from your doctor or pharmacy?: 1 - Never What is the last grade level you completed in school?: 14 yrs  Interpreter Needed?: No      Activities of Daily Living    02/23/2023    1:48 PM  In your present state of health, do you have any difficulty performing the following activities:  Hearing? 1  Vision? 0  Difficulty concentrating or making decisions? 0  Walking or climbing stairs? 0  Dressing or bathing? 0  Doing errands, shopping? 0  Preparing Food and eating ? N  Using the Toilet? N  In the past six months, have you accidently leaked urine? N  Do you have problems with loss of bowel control? N  Managing your Medications? N  Managing your Finances? N  Housekeeping or managing your Housekeeping? N    Patient Care Team: Sherlynn Madden, MD as PCP - General (Internal Medicine) [name], Donell Cardinal, MD as Consulting Physician (Endocrinology)  Indicate any recent Medical Services you may have received from other than [facility] providers in the past year (date may be approximate).     Assessment:   This is a routine wellness examination for [place].  Hearing/Vision screen Hearing Screening - Comments:: 3 months ago hearing exam got new hearing aids. Vision Screening - Comments:: Eye exam a month ago DR.Fleeta cataract removal.   Goals Addressed             This Visit's Progress    Weight (lb) < 200 lb (90.7 kg)   261 lb 9.6 oz (118.7 kg)    Exercise one hour every day        Depression Screen    02/23/2023    1:11 PM 09/09/2022    2:28 PM 02/06/2015   11:05 AM 11/12/2014   12:16 PM  PHQ 2/9 Scores  PHQ - 2 Score 0 0 0 0    Fall Risk    02/23/2023    1:11 PM 01/13/2023     1:34 PM 09/09/2022    2:27 PM 04/01/2022    9:08 AM 09/30/2021   10:17 AM  Fall Risk   Falls in the past year? 0 0  0 0  Number falls in past yr: 0 0 0 0 0  Injury with Fall? 0 0 0 0 0  Risk for fall due to :  No Fall Risks No Fall Risks No Fall Risks History of fall(s)  Follow up  Falls evaluation completed;Education provided;Falls prevention discussed Falls evaluation completed Falls evaluation completed     MEDICARE RISK AT HOME: Medicare Risk at Home Any stairs in or around the home?: Yes If so, are there any without handrails?: No Home free of loose throw rugs in walkways, pet beds, electrical cords, etc?: No Adequate lighting in your home to reduce risk of falls?: No (light in the bathroom not working right.will have it checked out) Life alert?: No Use of a cane, walker or w/c?: No Grab bars in the bathroom?: No Shower chair or bench in shower?: Yes Elevated toilet seat or a handicapped toilet?: No  TIMED UP AND GO:  Was the test performed?  Yes  Length of time to ambulate 10 feet: 8 sec Gait slow and steady without use of assistive device    Cognitive Function:    02/23/2023    1:20 PM  MMSE - Mini Mental State Exam  Orientation to time 5  Orientation to Place 5  Registration 3  Attention/ Calculation 5  Recall 3  Language- name 2 objects 2  Language- repeat 1  Language- follow 3 step command 3  Language- read & follow direction 1  Write a sentence 1  Copy design 1  Total score 30        Immunizations Immunization History  Administered Date(s) Administered   Fluad Quad(high Dose 65+) 10/24/2018, 11/18/2021   Fluad Trivalent(High Dose 65+) 10/14/2022   Influenza Split 11/07/2008   Influenza, High  Dose Seasonal PF 11/16/2012, 11/16/2013, 12/10/2016, 11/10/2017, 10/28/2020   Influenza,inj,Quad PF,6+ Mos 10/17/2014   Influenza,inj,quad, With Preservative 11/16/2013   Influenza-Unspecified 11/16/2012, 10/14/2018, 10/20/2019, 10/10/2020   Moderna Covid-19  Fall Seasonal Vaccine 45yrs & older 11/18/2021   Moderna Covid-19 Vaccine Bivalent Booster 49yrs & up 11/22/2020   Moderna Sars-Covid-2 Vaccination 02/26/2019, 03/01/2019, 03/29/2019, 04/01/2019, 01/08/2020, 05/09/2020   Pneumococcal Conjugate-13 07/28/2013   Pneumococcal Polysaccharide-23 11/09/2009, 11/10/2017   Respiratory Syncytial Virus Vaccine,Recomb Aduvanted(Arexvy) 11/25/2021   Tdap 11/10/2006, 04/19/2018, 04/19/2019   Zoster Recombinant(Shingrix) 12/08/2018, 05/05/2019   Zoster, Live 12/08/2018, 05/05/2019    TDAP status: Up to date  Flu Vaccine status: Up to date  Pneumococcal vaccine status: Up to date  Covid-19 vaccine status: Information provided on how to obtain vaccines.   Qualifies for Shingles Vaccine? Yes   Zostavax completed Yes   Shingrix Completed?: Yes  Screening Tests Health Maintenance  Topic Date Due   Zoster Vaccines- Shingrix (2 of 2) 06/30/2019   COVID-19 Vaccine (9 - 2024-25 season) 10/11/2022   Medicare Annual Wellness (AWV)  02/23/2024   DTaP/Tdap/Td (4 - Td or Tdap) 04/18/2029   Pneumonia Vaccine 51+ Years old  Completed   INFLUENZA VACCINE  Completed   HPV VACCINES  Aged Out    Health Maintenance  Health Maintenance Due  Topic Date Due   Zoster Vaccines- Shingrix (2 of 2) 06/30/2019   COVID-19 Vaccine (9 - 2024-25 season) 10/11/2022  States had shingles vaccine with former PCP Dr.Rosen no records for review   Colorectal cancer screening: No longer required.   Lung Cancer Screening: (Low Dose CT Chest recommended if Age 53-80 years, 20 pack-year currently smoking OR have quit w/in 15years.) does not qualify.   Lung Cancer Screening Referral: No   Additional Screening:  Hepatitis C Screening: does not qualify; Completed No   Vision Screening: Recommended annual ophthalmology exams for early detection of glaucoma and other disorders of the eye. Is the patient up to date with their annual eye exam?  Yes  Who is the provider or what  is the name of the office in which the patient attends annual eye exams? Dr.Van  If pt is not established with a provider, would they like to be referred to a provider to establish care? No .   Dental Screening: Recommended annual dental exams for proper oral hygiene  Diabetic Foot Exam: N/A   Community Resource Referral / Chronic Care Management: CRR required this visit?  No   CCM required this visit?  No     Plan:     I have personally reviewed and noted the following in the patient's chart:   Medical and social history Use of alcohol, tobacco or illicit drugs  Current medications and supplements including opioid prescriptions. Patient is not currently taking opioid prescriptions. Functional ability and status Nutritional status Physical activity Advanced directives List of other physicians Hospitalizations, surgeries, and ER visits in previous 12 months Vitals Screenings to include cognitive, depression, and falls Referrals and appointments  In addition, I have reviewed and discussed with patient certain preventive protocols, quality metrics, and best practice recommendations. A written personalized care plan for preventive services as well as general preventive health recommendations were provided to patient.     Roxan JAYSON Plough, NP   02/23/2023   After Visit Summary: (In Person-Printed) AVS printed and given to the patient  Nurse Notes: advised to get COVID-19 and RSV vaccine at the pharmacy.  Had shingles vaccine x 2 with previous PCP Dr.Rosen.No records  for review.

## 2023-04-06 DIAGNOSIS — R972 Elevated prostate specific antigen [PSA]: Secondary | ICD-10-CM | POA: Diagnosis not present

## 2023-04-06 DIAGNOSIS — R3912 Poor urinary stream: Secondary | ICD-10-CM | POA: Diagnosis not present

## 2023-04-06 DIAGNOSIS — R350 Frequency of micturition: Secondary | ICD-10-CM | POA: Diagnosis not present

## 2023-04-06 DIAGNOSIS — N401 Enlarged prostate with lower urinary tract symptoms: Secondary | ICD-10-CM | POA: Diagnosis not present

## 2023-04-29 DIAGNOSIS — I10 Essential (primary) hypertension: Secondary | ICD-10-CM | POA: Diagnosis not present

## 2023-04-29 DIAGNOSIS — I255 Ischemic cardiomyopathy: Secondary | ICD-10-CM | POA: Diagnosis not present

## 2023-04-29 DIAGNOSIS — I2510 Atherosclerotic heart disease of native coronary artery without angina pectoris: Secondary | ICD-10-CM | POA: Diagnosis not present

## 2023-04-29 DIAGNOSIS — E782 Mixed hyperlipidemia: Secondary | ICD-10-CM | POA: Diagnosis not present

## 2023-06-14 ENCOUNTER — Other Ambulatory Visit: Payer: Self-pay

## 2023-06-14 DIAGNOSIS — C83398 Diffuse large b-cell lymphoma of other extranodal and solid organ sites: Secondary | ICD-10-CM

## 2023-06-20 NOTE — Progress Notes (Signed)
 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 male here for continued evaluation and management of his primary testicular large B-cell lymphoma. Patient was last seen by me on 06/19/2022 . Patient notes no acute new symptoms since his last clinic visit.  He notes that he has been gaining some weight and wants to be more active.  Complains of some arthritic pains on and off. No acute new focal symptoms. Continues to be on testosterone  replacement with his endocrinologist Dr. Arcola Beards PSA levels in November 2024 were noted to be borderline elevated at 4.9 up from 2.6.  Commended to follow-up with his endocrinologist for further evaluation of his elevated PSA and possible consideration of urology referral if needed. He notes no new urinary symptoms.  No new testicular pain or swelling. No new chest pain or shortness of breath. No new headaches or focal neurological deficits.  MEDICAL HISTORY:  Past Medical History:  Diagnosis Date   Arthritis    Coronary artery disease    HOH (hard of hearing)    Hyperlipidemia    Hypertension    Ischemic cardiomyopathy    Lymphoma (HCC)    Myocardial infarction (HCC) 2016   Quadriceps tendon rupture    LEFT    SURGICAL HISTORY: Past Surgical History:  Procedure Laterality Date   BIOPSY  04/27/2018   Procedure: BIOPSY;  Surgeon: Genell Ken, MD;  Location: [facility];  Service: Gastroenterology;;   CARDIAC CATHETERIZATION N/A 10/15/2014   Procedure: Left Heart Cath and Coronary  Angiography;  Surgeon: Chapman Commodore, MD;  Location: [facility] INVASIVE CV LAB;  Service: Cardiovascular;  Laterality: N/A;   COLONOSCOPY     ESOPHAGOGASTRODUODENOSCOPY (EGD) WITH PROPOFOL  N/A 04/27/2018   Procedure: ESOPHAGOGASTRODUODENOSCOPY (EGD) WITH PROPOFOL ;  Surgeon: Genell Ken, MD;  Location: [facility];  Service: Gastroenterology;  Laterality: N/A;   IR IMAGING GUIDED PORT INSERTION  03/14/2018   IR REMOVAL TUN ACCESS W/ PORT W/O FL MOD SED  01/22/2021   MICROLARYNGOSCOPY WITH CO2 LASER AND EXCISION OF VOCAL CORD LESION N/A 11/26/2017   Procedure: MICROLARYNGOSCOPY WITH CO2 LASER AND EXCISION OF VOCAL CORD LESION WITH JET VENTILATION;  Surgeon: Virgina Grills, MD;  Location: [facility] OR;  Service: ENT;  Laterality: N/A;   OTHER SURGICAL HISTORY     Cataract   QUADRICEPS TENDON REPAIR Left 02/20/2014   Procedure: LEFT REPAIR QUADRICEP TENDON;  Surgeon: Bevin Bucks, MD;  Location: [facility];  Service: Orthopedics;  Laterality: Left;   THROAT SURGERY  2010    SOCIAL HISTORY: Social History   Socioeconomic History   Marital status: Divorced    Spouse name: Not on file   Number of children: Not on file   Years of education: Not on file   Highest education level: Not on file  Occupational History   Not on file  Tobacco Use   Smoking status: Former    Types: Cigarettes   Smokeless tobacco: Never  Vaping Use   Vaping status: Never Used  Substance and Sexual Activity   Alcohol use: Not Currently    Comment: Once a week wine   Drug use: Never  Sexual activity: Not on file  Other Topics Concern   Not on file  Social History Narrative   Not on file   Social Drivers of Health   Financial Resource Strain: Not on file  Food Insecurity: Not on file  Transportation Needs: No Transportation Needs (08/24/2018)   PRAPARE - Administrator, Civil Service (Medical): No    Lack of Transportation (Non-Medical): No  Physical Activity: Not on file  Stress: Not on file  Social  Connections: Not on file  Intimate Partner Violence: Not At Risk (08/24/2018)   Humiliation, Afraid, Rape, and Kick questionnaire    Fear of Current or Ex-Partner: No    Emotionally Abused: No    Physically Abused: No    Sexually Abused: No    FAMILY HISTORY: Family History  Problem Relation Age of Onset   Cancer Mother    Heart attack Father    Breast cancer Sister    Breast cancer Sister    Breast cancer Sister    Breast cancer Sister    Heart attack Brother        2005    ALLERGIES:  has no known allergies.  MEDICATIONS:  Current Outpatient Medications  Medication Sig Dispense Refill   aspirin  EC 81 MG tablet Take 81 mg by mouth daily.     atorvastatin  (LIPITOR) 40 MG tablet Take 1 tablet (40 mg total) by mouth daily at 6 PM. 60 tablet 3   Calcium  Carbonate-Vit D-Min (CALCIUM  1200 PO) Take 1 tablet by mouth daily after breakfast.     carvedilol  (COREG ) 6.25 MG tablet Take 3.125 mg by mouth 2 (two) times daily with a meal.      cholecalciferol (VITAMIN D) 1000 UNITS tablet Take 5,000 Units by mouth daily.      losartan  (COZAAR ) 25 MG tablet Take 1 tablet (25 mg total) by mouth daily after breakfast. 90 tablet 1   Multiple Vitamin (MULTIVITAMIN WITH MINERALS) TABS tablet Take 1 tablet by mouth daily.     nitroGLYCERIN  (NITROSTAT ) 0.4 MG SL tablet Place 1 tablet (0.4 mg total) under the tongue every 5 (five) minutes x 3 doses as needed for chest pain. (Patient not taking: Reported on 02/23/2023) 25 tablet 12   Polyethyl Glycol-Propyl Glycol (SYSTANE OP) Place 1 drop into both eyes at bedtime.     Testosterone  20.25 MG/ACT (1.62%) GEL Apply 1 Pump topically in the morning and at bedtime. 150 g 5   white petrolatum (VASELINE) GEL Apply 1 application topically as needed for dry skin (itching).     No current facility-administered medications for this visit.   Facility-Administered Medications Ordered in Other Visits  Medication Dose Route Frequency Provider Last Rate Last Admin    influenza vaccine adjuvanted (FLUAD) injection 0.5 mL  0.5 mL Intramuscular Once Frankie Israel, MD        REVIEW OF SYSTEMS:    10 Point review of Systems was done is negative except as noted above.   PHYSICAL EXAMINATION: ECOG PERFORMANCE STATUS: 2 - Symptomatic, <50% confined to bed  Vitals:   06/21/23 1018  BP: 128/71  Pulse: (!) 59  Resp: 18  Temp: (!) 97 F (36.1 C)  SpO2: 96%   Filed Weights   06/21/23 1018  Weight: 260 lb 4.8 oz (118.1 kg)  .Body mass index is 34.34 kg/m.  GENERAL:alert, in no acute distress and comfortable SKIN: no acute rashes, no significant lesions EYES: conjunctiva are pink and non-injected, sclera anicteric OROPHARYNX: MMM, no exudates,  no oropharyngeal erythema or ulceration NECK: supple, no JVD LYMPH:  no palpable lymphadenopathy in the cervical, axillary or inguinal regions LUNGS: clear to auscultation b/l with normal respiratory effort HEART: regular rate & rhythm ABDOMEN:  normoactive bowel sounds , non tender, not distended. Extremity: no pedal edema PSYCH: alert & oriented x 3 with fluent speech NEURO: no focal motor/sensory deficits   LABORATORY DATA:  I have reviewed the data as listed  .    Latest Ref Rng & Units 06/21/2023    9:35 AM 06/19/2022    9:34 AM 12/19/2021   11:36 AM  CBC  WBC 4.0 - 10.5 K/uL 4.4  5.4  4.2   Hemoglobin 13.0 - 17.0 g/dL 16.1  09.6  04.5   Hematocrit 39.0 - 52.0 % 47.0  45.3  42.1   Platelets 150 - 400 K/uL 164  166  150     .    Latest Ref Rng & Units 06/21/2023    9:35 AM 01/13/2023    2:03 PM 10/14/2022    2:10 PM  CMP  Glucose 70 - 99 mg/dL 409  84  80   BUN 8 - 23 mg/dL 14  13  24    Creatinine 0.61 - 1.24 mg/dL 8.11  9.14  7.82   Sodium 135 - 145 mmol/L 138  139  140   Potassium 3.5 - 5.1 mmol/L 3.9  4.8  4.7   Chloride 98 - 111 mmol/L 107  104  105   CO2 22 - 32 mmol/L 27  30  26    Calcium  8.9 - 10.3 mg/dL 8.7  8.7  9.0   Total Protein 6.5 - 8.1 g/dL 6.7     Total Bilirubin  0.0 - 1.2 mg/dL 2.0     Alkaline Phos 38 - 126 U/L 47     AST 15 - 41 U/L 23     ALT 0 - 44 U/L 21      . Lab Results  Component Value Date   LDH 141 06/21/2023     01/05/18 Pathology:    RADIOGRAPHIC STUDIES: I have personally reviewed the radiological images as listed and agreed with the findings in the report. No results found.  ASSESSMENT & PLAN:   83 y.o. male with  1. Primary Testicular Diffuse Large B-Cell Lymphoma, Stage 1E 11/30/16 NM Myocar Multi w/spect w/wall motion which revealed a LV EF of 44%  12/08/17 US  Scrotum revealed Abnormal appearance of the left testicle which is larger than the right and diffusely heterogeneous in echotexture. Cannot exclude infiltrating process/mass. Appearance is concerning for possible neoplasm.  01/05/18 Left testes biopsy revealed Primary Testicular Diffuse Large B-Cell Lymphoma   01/25/18 Hep B, Hep C and HIV negative  01/31/18 ECHO revealed a LV EF of 51%. Left ventricle: The cavity size was normal. Wall motion was normal; there were no regional wall motion abnormalities. Atrial septum: No defect or patent foramen ovale was identified.  02/07/18 PET/CT revealed No findings for metabolically active lymphoma involving the neck, chest, abdomen/pelvis or osseous structures.  Due to admission after first intrathecal methotrexate , concern for brain toxicity given patient's age, and concern for treatment tolerance, decided to hold IT MTX. No further concerns for bleeding, nausea, or vomiting.  08/23/2018 PET scan revealing Mildly hypermetabolic mildly enlarged AP window lymph node, stable since 02/07/2018 PET-CT. Low level metabolism associated with new mild patchy consolidation in the medial segment right middle lobe, potentially Inflammatory. New mildly hypermetabolic right hilar lymph nodes, nonspecific, potentially reactive  to the right middle lobe process. Nonspecific patchy hypermetabolism within the right testis is increased. No  discrete right testicular mass on the noncontrast CT Images. These findings are equivocal for active lymphoma. Suggest attention on follow-up PET-CT in 3-6 months. Aortic Atherosclerosis (ICD10-I70.0).  Past Medical History:  Diagnosis Date   Arthritis    Coronary artery disease    HOH (hard of hearing)    Hyperlipidemia    Hypertension    Ischemic cardiomyopathy    Lymphoma (HCC)    Myocardial infarction (HCC) 2016   Quadriceps tendon rupture    LEFT    PLAN:  -discussed lab results from today. 06/21/2023, in detail with patient.  -CBC WNL -CMP showed elevation of bilirubin at 2.  Could be from his fatty liver, Gilbert's syndrome but also could be from his medications including testosterone  replacement and statins.  He was recommended to follow-up with his primary care physician regarding this for further evaluation. -His outside labs with his endocrinologist Dr. Rosalea Collin shows a bump in his PSA levels while on testosterone .  This will need to be monitored and he may potentially need a urology referral.  I have counseled the patient to reach out to his endocrinologist and PCP regarding this for continued evaluation and management.  He notes that he will do that. - Patient has no clinical symptoms suggestive of recurrence of his primary testicular large B-cell lymphoma.  His LDH levels are also within normal limits at 141. - No indication for further oncologic intervention at this time. - FOLLOW UP: RTC with Dr Salomon Cree with labs in 12 months   The total time spent in the appointment was 21 minutes* .  All of the patient's questions were answered with apparent satisfaction. The patient knows to call the clinic with any problems, questions or concerns.   Jacquelyn Matt MD MS AAHIVMS [facility] Hematology/Oncology Physician [facility]  .*Total Encounter Time as defined by the Centers for Medicare and Medicaid Services includes, in addition to the face-to-face time of a  patient visit (documented in the note above) non-face-to-face time: obtaining and reviewing outside history, ordering and reviewing medications, tests or procedures, care coordination (communications with other health care professionals or caregivers) and documentation in the medical record.    I,Mitra Faeizi,acting as a Neurosurgeon for Jacquelyn Matt, MD.,have documented all relevant documentation on the behalf of Jacquelyn Matt, MD,as directed by  Jacquelyn Matt, MD while in the presence of Jacquelyn Matt, MD.  .I have reviewed the above documentation for accuracy and completeness, and I agree with the above. .Emaan Gary Kishore Emileo Semel MD

## 2023-06-21 ENCOUNTER — Inpatient Hospital Stay: Payer: Medicare PPO | Attending: Hematology

## 2023-06-21 ENCOUNTER — Inpatient Hospital Stay: Payer: Medicare PPO | Admitting: Hematology

## 2023-06-21 VITALS — BP 128/71 | HR 59 | Temp 97.0°F | Resp 18 | Wt 260.3 lb

## 2023-06-21 DIAGNOSIS — E785 Hyperlipidemia, unspecified: Secondary | ICD-10-CM | POA: Diagnosis not present

## 2023-06-21 DIAGNOSIS — Z809 Family history of malignant neoplasm, unspecified: Secondary | ICD-10-CM | POA: Diagnosis not present

## 2023-06-21 DIAGNOSIS — I252 Old myocardial infarction: Secondary | ICD-10-CM | POA: Insufficient documentation

## 2023-06-21 DIAGNOSIS — Z7982 Long term (current) use of aspirin: Secondary | ICD-10-CM | POA: Insufficient documentation

## 2023-06-21 DIAGNOSIS — Z87891 Personal history of nicotine dependence: Secondary | ICD-10-CM | POA: Insufficient documentation

## 2023-06-21 DIAGNOSIS — I2510 Atherosclerotic heart disease of native coronary artery without angina pectoris: Secondary | ICD-10-CM | POA: Diagnosis not present

## 2023-06-21 DIAGNOSIS — C83398 Diffuse large b-cell lymphoma of other extranodal and solid organ sites: Secondary | ICD-10-CM | POA: Insufficient documentation

## 2023-06-21 DIAGNOSIS — I700 Atherosclerosis of aorta: Secondary | ICD-10-CM | POA: Diagnosis not present

## 2023-06-21 DIAGNOSIS — Z8249 Family history of ischemic heart disease and other diseases of the circulatory system: Secondary | ICD-10-CM | POA: Insufficient documentation

## 2023-06-21 DIAGNOSIS — I10 Essential (primary) hypertension: Secondary | ICD-10-CM | POA: Diagnosis not present

## 2023-06-21 DIAGNOSIS — Z79899 Other long term (current) drug therapy: Secondary | ICD-10-CM | POA: Insufficient documentation

## 2023-06-21 DIAGNOSIS — Z803 Family history of malignant neoplasm of breast: Secondary | ICD-10-CM | POA: Diagnosis not present

## 2023-06-21 LAB — CBC WITH DIFFERENTIAL (CANCER CENTER ONLY)
Abs Immature Granulocytes: 0.01 10*3/uL (ref 0.00–0.07)
Basophils Absolute: 0.1 10*3/uL (ref 0.0–0.1)
Basophils Relative: 1 %
Eosinophils Absolute: 0.2 10*3/uL (ref 0.0–0.5)
Eosinophils Relative: 4 %
HCT: 47 % (ref 39.0–52.0)
Hemoglobin: 15.7 g/dL (ref 13.0–17.0)
Immature Granulocytes: 0 %
Lymphocytes Relative: 27 %
Lymphs Abs: 1.2 10*3/uL (ref 0.7–4.0)
MCH: 31.5 pg (ref 26.0–34.0)
MCHC: 33.4 g/dL (ref 30.0–36.0)
MCV: 94.4 fL (ref 80.0–100.0)
Monocytes Absolute: 0.6 10*3/uL (ref 0.1–1.0)
Monocytes Relative: 14 %
Neutro Abs: 2.4 10*3/uL (ref 1.7–7.7)
Neutrophils Relative %: 54 %
Platelet Count: 164 10*3/uL (ref 150–400)
RBC: 4.98 MIL/uL (ref 4.22–5.81)
RDW: 12.4 % (ref 11.5–15.5)
WBC Count: 4.4 10*3/uL (ref 4.0–10.5)
nRBC: 0 % (ref 0.0–0.2)

## 2023-06-21 LAB — CMP (CANCER CENTER ONLY)
ALT: 21 U/L (ref 0–44)
AST: 23 U/L (ref 15–41)
Albumin: 3.9 g/dL (ref 3.5–5.0)
Alkaline Phosphatase: 47 U/L (ref 38–126)
Anion gap: 4 — ABNORMAL LOW (ref 5–15)
BUN: 14 mg/dL (ref 8–23)
CO2: 27 mmol/L (ref 22–32)
Calcium: 8.7 mg/dL — ABNORMAL LOW (ref 8.9–10.3)
Chloride: 107 mmol/L (ref 98–111)
Creatinine: 1.19 mg/dL (ref 0.61–1.24)
GFR, Estimated: 60 mL/min — ABNORMAL LOW (ref >60)
Glucose, Bld: 112 mg/dL — ABNORMAL HIGH (ref 70–99)
Potassium: 3.9 mmol/L (ref 3.5–5.1)
Sodium: 138 mmol/L (ref 135–145)
Total Bilirubin: 2 mg/dL — ABNORMAL HIGH (ref 0.0–1.2)
Total Protein: 6.7 g/dL (ref 6.5–8.1)

## 2023-06-21 LAB — LACTATE DEHYDROGENASE: LDH: 141 U/L (ref 98–192)

## 2023-07-07 ENCOUNTER — Encounter: Payer: Self-pay | Admitting: Internal Medicine

## 2023-07-07 ENCOUNTER — Ambulatory Visit: Payer: Medicare PPO | Admitting: Internal Medicine

## 2023-07-07 ENCOUNTER — Other Ambulatory Visit

## 2023-07-07 VITALS — BP 120/72 | HR 80 | Ht 73.0 in | Wt 262.0 lb

## 2023-07-07 DIAGNOSIS — E291 Testicular hypofunction: Secondary | ICD-10-CM

## 2023-07-07 DIAGNOSIS — R972 Elevated prostate specific antigen [PSA]: Secondary | ICD-10-CM

## 2023-07-07 NOTE — Progress Notes (Signed)
 Name: Carl [name]  MRN/ DOB: [id number], [birth year]    Age/ Sex: 83 y.o., male     PCP: Tye Gall, MD   Reason for Endocrinology Evaluation: Testicular radiotherapy/ Hypogonadism      Initial Endocrinology Clinic Visit: 04/11/2019    PATIENT IDENTIFIER: Mr. Carl [name] is a 83 y.o., male with a past medical history of CAD ( S/P DES 2016) and HTN . He has followed with [facility] since 04/11/2019 for consultative assistance with management of his Testicular radiotherapy  HISTORICAL SUMMARY:  Pt was diagnosed with testicular lymphoma in 01/2018. He is S/P chemo and scrotal Radiation therapy, he completed radiation therapy in 09/2018.    He was referred here to evaluate , monitor and treat for hypogonadism should it develop following radiation exposure.  Sees Dr. Glena Landau (cardiology )   Pt was noted with low free testosterone  x2 by 10/2019 and was started on testosterone  replacement therapy    The patient was referred to urology 12/2022 for further evaluation of increase in PSA to 4.91 ng/mL (up from 2.65 in 06/2021) , he was started on tamsulosin through urology   SUBJECTIVE:    Today (07/07/2023):  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   Patient continues to follow-up with oncology for history of primary testicular diffuse large B-cell lymphoma  He continues to follow-up with urology for elevated PSA, he was started on tamsulosin 03/2023  He denies any chest pain or shortness of breath No lower extremity edema Denies spontaneous erections  No skin rash with testosterone  use Nocturia x1        HISTORY:  Past Medical History:  Past Medical History:  Diagnosis Date   Arthritis    Coronary artery disease    HOH (hard of hearing)    Hyperlipidemia    Hypertension    Ischemic cardiomyopathy    Lymphoma (HCC)    Myocardial infarction (HCC) 2016   Quadriceps tendon  rupture    LEFT   Past Surgical History:  Past Surgical History:  Procedure Laterality Date   BIOPSY  04/27/2018   Procedure: BIOPSY;  Surgeon: Genell Ken, MD;  [facility]: [facility];  Service: Gastroenterology;;   CARDIAC CATHETERIZATION N/A 10/15/2014   Procedure: Left Heart Cath and Coronary Angiography;  Surgeon: Chapman Commodore, MD;  Location: [facility] INVASIVE CV LAB;  Service: Cardiovascular;  Laterality: N/A;   COLONOSCOPY     ESOPHAGOGASTRODUODENOSCOPY (EGD) WITH PROPOFOL  N/A 04/27/2018   Procedure: ESOPHAGOGASTRODUODENOSCOPY (EGD) WITH PROPOFOL ;  Surgeon: Genell Ken, MD;  Location: [facility];  Service: Gastroenterology;  Laterality: N/A;   IR IMAGING GUIDED PORT INSERTION  03/14/2018   IR REMOVAL TUN ACCESS W/ PORT W/O FL MOD SED  01/22/2021   MICROLARYNGOSCOPY WITH CO2 LASER AND EXCISION OF VOCAL CORD LESION N/A 11/26/2017   Procedure: MICROLARYNGOSCOPY WITH CO2 LASER AND EXCISION OF VOCAL CORD LESION WITH JET VENTILATION;  Surgeon: Virgina Grills, MD;  Location: [facility] OR;  Service: ENT;  Laterality: N/A;   OTHER SURGICAL HISTORY     Cataract   QUADRICEPS TENDON REPAIR Left 02/20/2014   Procedure: LEFT REPAIR QUADRICEP TENDON;  Surgeon: Bevin Bucks, MD;  Location: [facility];  Service: Orthopedics;  Laterality: Left;   THROAT SURGERY  2010   Social History:  reports that he has quit smoking. His smoking use included cigarettes. He has never used smokeless tobacco. He reports that he does not currently use alcohol. He reports that he does  not use drugs. Family History:  Family History  Problem Relation Age of Onset   Cancer Mother    Heart attack Father    Breast cancer Sister    Breast cancer Sister    Breast cancer Sister    Breast cancer Sister    Heart attack Brother        2005     HOME MEDICATIONS: Allergies as of 07/07/2023   No Known Allergies      Medication List        Accurate as of Jul 07, 2023  7:06 AM. If you have any questions, ask your nurse or  doctor.          aspirin  EC 81 MG tablet Take 81 mg by mouth daily.   atorvastatin  40 MG tablet Commonly known as: LIPITOR Take 1 tablet (40 mg total) by mouth daily at 6 PM.   CALCIUM  1200 PO Take 1 tablet by mouth daily after breakfast.   carvedilol  6.25 MG tablet Commonly known as: COREG  Take 3.125 mg by mouth 2 (two) times daily with a meal.   cholecalciferol 1000 units tablet Commonly known as: VITAMIN D Take 5,000 Units by mouth daily.   losartan  25 MG tablet Commonly known as: COZAAR  Take 1 tablet (25 mg total) by mouth daily after breakfast.   multivitamin with minerals Tabs tablet Take 1 tablet by mouth daily.   nitroGLYCERIN  0.4 MG SL tablet Commonly known as: NITROSTAT  Place 1 tablet (0.4 mg total) under the tongue every 5 (five) minutes x 3 doses as needed for chest pain.   SYSTANE OP Place 1 drop into both eyes at bedtime.   tamsulosin 0.4 MG Caps capsule Commonly known as: FLOMAX Take 0.4 mg by mouth daily.   Testosterone  20.25 MG/ACT (1.62%) Gel Apply 1 Pump topically in the morning and at bedtime.   white petrolatum Gel Commonly known as: VASELINE Apply 1 application topically as needed for dry skin (itching).          OBJECTIVE:   PHYSICAL EXAM: VS: BP 120/72 (BP Location: Left Arm, Patient Position: Sitting, Cuff Size: Normal)   Pulse 80   Ht 6\' 1"  (1.854 m)   Wt 262 lb (118.8 kg)   SpO2 96%   BMI 34.57 kg/m    EXAM: General: Pt appears well and is in NAD  Lungs: Clear with good BS bilat   Heart: Auscultation: RRR.  Extremities:  BL LE: Trace pretibial edema   Mental Status: Judgment, insight: Intact Orientation: Oriented to time, place, and person Mood and affect: No depression, anxiety, or agitation     DATA REVIEWED:   Latest Reference Range & Units 07/07/23 10:51  PSA < OR = 4.00 ng/mL 4.24 (H)      Latest Reference Range & Units 06/21/23 09:35  WBC 4.0 - 10.5 K/uL 4.4  RBC 4.22 - 5.81 MIL/uL 4.98   Hemoglobin 13.0 - 17.0 g/dL 16.1  HCT 09.6 - 04.5 % 47.0  MCV 80.0 - 100.0 fL 94.4  MCH 26.0 - 34.0 pg 31.5  MCHC 30.0 - 36.0 g/dL 40.9  RDW 81.1 - 91.4 % 12.4  Platelets 150 - 400 K/uL 164  nRBC 0.0 - 0.2 % 0.0  Neutrophils % 54  Lymphocytes % 27  Monocytes Relative % 14  Eosinophil % 4  Basophil % 1  Immature Granulocytes % 0  NEUT# 1.7 - 7.7 K/uL 2.4  Lymphs Abs 0.7 - 4.0 K/uL 1.2  Monocyte # 0.1 - 1.0 K/uL 0.6  Eosinophils Absolute 0.0 - 0.5 K/uL 0.2  Basophils Absolute 0.0 - 0.1 K/uL 0.1  Abs Immature Granulocytes 0.00 - 0.07 K/uL 0.01      Latest Reference Range & Units 12/30/21 12:03  Testosterone -% Free % 0.6  Testosterone , Serum (Total) ng/dL 161  Free Testosterone , Serum pg/mL 29 (L)  (L): Data is abnormally low   ASSESSMENT / PLAN / RECOMMENDATIONS:    1. Hypogonadism:   - Testosterone  goal for him given advanced age is low normal level. - Part of his total testosterone  is due to elevation of sex hormone binding globulin, it is important to monitor his free testosterone  - Patient is clinically stable - Testosterone  is pending today  Medication  Continue testosterone  gel 1 pump BID    2.  Elevated PSA:  - There has been slight elevation in PSA which was evaluated by urology 12/2022 -Levels trending down -Will defer to urology    F/U in 6 months      Signed electronically by: Natale Bail, MD  [facility]  [facility] [address] [place] [place], [place] [address] Phone: [phone] FAX: [phone]      CC: Tye Gall, MD [address] [place] [address] Phone: [phone]  Fax: [phone]   Return to Endocrinology clinic as below: Future Appointments  Date Time Provider Department Center  07/07/2023 10:30 AM Tenee Wish, Julian Obey, MD [facility] None  02/25/2024  1:20 PM Ngetich, Elijio Guadeloupe, NP [facility] None  06/26/2024 10:00 AM [facility]  None  06/26/2024 10:30 AM Frankie Israel, MD [facility] None

## 2023-07-12 LAB — PSA: PSA: 4.24 ng/mL — ABNORMAL HIGH (ref <=4.00)

## 2023-07-12 LAB — TESTOSTERONE, FREE & TOTAL
Free Testosterone: 24.1 pg/mL — ABNORMAL LOW (ref 30.0–135.0)
Testosterone, Total, LC-MS-MS: 344 ng/dL (ref 250–1100)

## 2023-07-13 ENCOUNTER — Telehealth: Payer: Self-pay

## 2023-07-13 ENCOUNTER — Ambulatory Visit: Payer: Self-pay | Admitting: Internal Medicine

## 2023-07-13 MED ORDER — TESTOSTERONE 20.25 MG/ACT (1.62%) TD GEL: 3.0000 | Freq: Every day | TRANSDERMAL | 5 refills | Status: DC

## 2023-07-13 NOTE — Telephone Encounter (Signed)
 Please asked the patient to increase testosterone  from 2 pumps a day to 3 pumps a day because his testosterone  is still low    He may have 2 pumps in 1 shoulder and 1 pump of the other shoulder   Thanks

## 2023-07-13 NOTE — Telephone Encounter (Signed)
 [facility] can not process the Testosterone . They state patient has been getting it filled at [facility].

## 2023-07-15 DIAGNOSIS — E785 Hyperlipidemia, unspecified: Secondary | ICD-10-CM | POA: Diagnosis not present

## 2023-07-15 DIAGNOSIS — I10 Essential (primary) hypertension: Secondary | ICD-10-CM | POA: Diagnosis not present

## 2023-07-15 DIAGNOSIS — R7303 Prediabetes: Secondary | ICD-10-CM | POA: Diagnosis not present

## 2023-07-17 ENCOUNTER — Other Ambulatory Visit: Payer: Self-pay | Admitting: Internal Medicine

## 2023-07-19 MED ORDER — TESTOSTERONE 20.25 MG/ACT (1.62%) TD GEL: 3.0000 | Freq: Every day | TRANSDERMAL | 5 refills | Status: DC

## 2023-07-19 NOTE — Addendum Note (Signed)
 Addended by: Valentina Gasman on: 07/19/2023 12:52 PM   Modules accepted: Orders

## 2023-07-19 NOTE — Telephone Encounter (Signed)
 Testosterone  needs to go to the [place] because [place] doesn't handle this medication.

## 2023-07-26 DIAGNOSIS — H0288A Meibomian gland dysfunction right eye, upper and lower eyelids: Secondary | ICD-10-CM | POA: Diagnosis not present

## 2023-07-26 DIAGNOSIS — H0288B Meibomian gland dysfunction left eye, upper and lower eyelids: Secondary | ICD-10-CM | POA: Diagnosis not present

## 2023-07-26 DIAGNOSIS — H35363 Drusen (degenerative) of macula, bilateral: Secondary | ICD-10-CM | POA: Diagnosis not present

## 2023-07-26 DIAGNOSIS — H524 Presbyopia: Secondary | ICD-10-CM | POA: Diagnosis not present

## 2023-07-26 DIAGNOSIS — H40013 Open angle with borderline findings, low risk, bilateral: Secondary | ICD-10-CM | POA: Diagnosis not present

## 2023-07-29 DIAGNOSIS — E782 Mixed hyperlipidemia: Secondary | ICD-10-CM | POA: Diagnosis not present

## 2023-07-29 DIAGNOSIS — I10 Essential (primary) hypertension: Secondary | ICD-10-CM | POA: Diagnosis not present

## 2023-07-29 DIAGNOSIS — I2510 Atherosclerotic heart disease of native coronary artery without angina pectoris: Secondary | ICD-10-CM | POA: Diagnosis not present

## 2023-07-29 DIAGNOSIS — I255 Ischemic cardiomyopathy: Secondary | ICD-10-CM | POA: Diagnosis not present

## 2023-07-29 NOTE — Telephone Encounter (Signed)
 Done

## 2023-09-24 ENCOUNTER — Telehealth: Payer: Self-pay

## 2023-09-24 MED ORDER — TESTOSTERONE 20.25 MG/ACT (1.62%) TD GEL: 4.0000 | Freq: Every day | TRANSDERMAL | 5 refills | Status: DC

## 2023-09-24 NOTE — Telephone Encounter (Signed)
 Patient made aware to do 2 pumps on each shoulder at one time.

## 2023-09-24 NOTE — Telephone Encounter (Signed)
 Patient states that he has been doing 1 pump to each shoulder BID and needs to have the prescription change to reflect that so he can pick it up. He has been out for four days.

## 2023-09-27 DIAGNOSIS — R972 Elevated prostate specific antigen [PSA]: Secondary | ICD-10-CM | POA: Diagnosis not present

## 2023-10-04 DIAGNOSIS — N401 Enlarged prostate with lower urinary tract symptoms: Secondary | ICD-10-CM | POA: Diagnosis not present

## 2023-10-04 DIAGNOSIS — R972 Elevated prostate specific antigen [PSA]: Secondary | ICD-10-CM | POA: Diagnosis not present

## 2023-10-04 DIAGNOSIS — R3912 Poor urinary stream: Secondary | ICD-10-CM | POA: Diagnosis not present

## 2023-10-04 DIAGNOSIS — R350 Frequency of micturition: Secondary | ICD-10-CM | POA: Diagnosis not present

## 2023-10-29 DIAGNOSIS — E782 Mixed hyperlipidemia: Secondary | ICD-10-CM | POA: Diagnosis not present

## 2023-10-29 DIAGNOSIS — I2510 Atherosclerotic heart disease of native coronary artery without angina pectoris: Secondary | ICD-10-CM | POA: Diagnosis not present

## 2023-10-29 DIAGNOSIS — I255 Ischemic cardiomyopathy: Secondary | ICD-10-CM | POA: Diagnosis not present

## 2023-10-29 DIAGNOSIS — I10 Essential (primary) hypertension: Secondary | ICD-10-CM | POA: Diagnosis not present

## 2023-12-25 DIAGNOSIS — N400 Enlarged prostate without lower urinary tract symptoms: Secondary | ICD-10-CM | POA: Diagnosis not present

## 2023-12-25 DIAGNOSIS — M1990 Unspecified osteoarthritis, unspecified site: Secondary | ICD-10-CM | POA: Diagnosis not present

## 2023-12-25 DIAGNOSIS — E785 Hyperlipidemia, unspecified: Secondary | ICD-10-CM | POA: Diagnosis not present

## 2023-12-25 DIAGNOSIS — I255 Ischemic cardiomyopathy: Secondary | ICD-10-CM | POA: Diagnosis not present

## 2023-12-25 DIAGNOSIS — E669 Obesity, unspecified: Secondary | ICD-10-CM | POA: Diagnosis not present

## 2023-12-25 DIAGNOSIS — N1831 Chronic kidney disease, stage 3a: Secondary | ICD-10-CM | POA: Diagnosis not present

## 2023-12-25 DIAGNOSIS — I129 Hypertensive chronic kidney disease with stage 1 through stage 4 chronic kidney disease, or unspecified chronic kidney disease: Secondary | ICD-10-CM | POA: Diagnosis not present

## 2023-12-25 DIAGNOSIS — I252 Old myocardial infarction: Secondary | ICD-10-CM | POA: Diagnosis not present

## 2023-12-25 DIAGNOSIS — I700 Atherosclerosis of aorta: Secondary | ICD-10-CM | POA: Diagnosis not present

## 2024-01-10 ENCOUNTER — Ambulatory Visit: Admitting: Internal Medicine

## 2024-01-10 ENCOUNTER — Encounter: Payer: Self-pay | Admitting: Internal Medicine

## 2024-01-10 ENCOUNTER — Other Ambulatory Visit

## 2024-01-10 VITALS — BP 118/80 | Ht 73.0 in | Wt 263.0 lb

## 2024-01-10 DIAGNOSIS — R718 Other abnormality of red blood cells: Secondary | ICD-10-CM

## 2024-01-10 DIAGNOSIS — E291 Testicular hypofunction: Secondary | ICD-10-CM | POA: Diagnosis not present

## 2024-01-10 NOTE — Progress Notes (Addendum)
 Name: Carl [name]  MRN/ DOB: [id number], Dec 17, 1940    Age/ Sex: 83 y.o., male     PCP: Sherlynn Madden, MD   Reason for Endocrinology Evaluation: Testicular radiotherapy/ Hypogonadism      Initial Endocrinology Clinic Visit: 04/11/2019    PATIENT IDENTIFIER: Mr. QADIR FOLKS is a 83 y.o., male with a past medical history of CAD ( S/P DES 2016) and HTN . He has followed with [facility] since 04/11/2019 for consultative assistance with management of his Testicular radiotherapy  HISTORICAL SUMMARY:  Pt was diagnosed with testicular lymphoma in 01/2018. He is S/P chemo and scrotal Radiation therapy, he completed radiation therapy in 09/2018.    He was referred here to evaluate , monitor and treat for hypogonadism should it develop following radiation exposure.  Sees Dr. Levern (cardiology )   Pt was noted with low free testosterone  x2 by 10/2019 and was started on testosterone  replacement therapy    The patient was referred to urology 12/2022 for further evaluation of increase in PSA to 4.91 ng/mL (up from 2.65 in 06/2021) , he was started on tamsulosin through urology   SUBJECTIVE:    Today (01/10/2024):  Mr. 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   Patient continues to follow-up with oncology for history of primary testicular diffuse large B-cell lymphoma  He continues to follow-up with urology for elevated PSA, he was started on tamsulosin 03/2023 Patient also had a follow-up with cardiology since his last visit here NO chest pain  Has noted fatigue with exertion  No palpitations  No spontaneous erection  NO LE swelling  Nocturia x1    Testosterone  gel ,2 pumps to each shoulder     HISTORY:  Past Medical History:  Past Medical History:  Diagnosis Date   Arthritis    Coronary artery disease    HOH (hard of hearing)    Hyperlipidemia    Hypertension    Ischemic  cardiomyopathy    Lymphoma (HCC)    Myocardial infarction (HCC) 2016   Quadriceps tendon rupture    LEFT   Past Surgical History:  Past Surgical History:  Procedure Laterality Date   BIOPSY  04/27/2018   Procedure: BIOPSY;  Surgeon: Saintclair Jasper, MD;  Location: [facility];  Service: Gastroenterology;;   CARDIAC CATHETERIZATION N/A 10/15/2014   Procedure: Left Heart Cath and Coronary Angiography;  Surgeon: Rober Levern, MD;  Location: [facility] INVASIVE CV LAB;  Service: Cardiovascular;  Laterality: N/A;   COLONOSCOPY     ESOPHAGOGASTRODUODENOSCOPY (EGD) WITH PROPOFOL  N/A 04/27/2018   Procedure: ESOPHAGOGASTRODUODENOSCOPY (EGD) WITH PROPOFOL ;  Surgeon: Saintclair Jasper, MD;  Location: [facility];  Service: Gastroenterology;  Laterality: N/A;   IR IMAGING GUIDED PORT INSERTION  03/14/2018   IR REMOVAL TUN ACCESS W/ PORT W/O FL MOD SED  01/22/2021   MICROLARYNGOSCOPY WITH CO2 LASER AND EXCISION OF VOCAL CORD LESION N/A 11/26/2017   Procedure: MICROLARYNGOSCOPY WITH CO2 LASER AND EXCISION OF VOCAL CORD LESION WITH JET VENTILATION;  Surgeon: Carlie Clark, MD;  Location: [facility] OR;  Service: ENT;  Laterality: N/A;   OTHER SURGICAL HISTORY     Cataract   QUADRICEPS TENDON REPAIR Left 02/20/2014   Procedure: LEFT REPAIR QUADRICEP TENDON;  Surgeon: Donnice JONETTA Car, MD;  Location: [facility];  Service: Orthopedics;  Laterality: Left;   THROAT SURGERY  2010   Social History:  reports that he has quit smoking. His smoking use included cigarettes. He has never used  smokeless tobacco. He reports that he does not currently use alcohol. He reports that he does not use drugs. Family History:  Family History  Problem Relation Age of Onset   Cancer Mother    Heart attack Father    Breast cancer Sister    Breast cancer Sister    Breast cancer Sister    Breast cancer Sister    Heart attack Brother        2005     HOME MEDICATIONS: Allergies as of 01/10/2024   No Known Allergies      Medication List         Accurate as of January 10, 2024  7:22 AM. If you have any questions, ask your nurse or doctor.          aspirin  EC 81 MG tablet Take 81 mg by mouth daily.   atorvastatin  40 MG tablet Commonly known as: LIPITOR Take 1 tablet (40 mg total) by mouth daily at 6 PM.   CALCIUM  1200 PO Take 1 tablet by mouth daily after breakfast.   carvedilol  6.25 MG tablet Commonly known as: COREG  Take 3.125 mg by mouth 2 (two) times daily with a meal.   cholecalciferol 1000 units tablet Commonly known as: VITAMIN D Take 5,000 Units by mouth daily.   losartan  25 MG tablet Commonly known as: COZAAR  Take 1 tablet (25 mg total) by mouth daily after breakfast.   multivitamin with minerals Tabs tablet Take 1 tablet by mouth daily.   nitroGLYCERIN  0.4 MG SL tablet Commonly known as: NITROSTAT  Place 1 tablet (0.4 mg total) under the tongue every 5 (five) minutes x 3 doses as needed for chest pain.   SYSTANE OP Place 1 drop into both eyes at bedtime.   tamsulosin 0.4 MG Caps capsule Commonly known as: FLOMAX Take 0.4 mg by mouth daily.   Testosterone  20.25 MG/ACT (1.62%) Gel Apply 4 Pump topically daily in the afternoon.   white petrolatum Gel Commonly known as: VASELINE Apply 1 application topically as needed for dry skin (itching).          OBJECTIVE:   PHYSICAL EXAM: VS: BP 118/80   Ht 6' 1 (1.854 m)   Wt 263 lb (119.3 kg)   BMI 34.70 kg/m     EXAM: General: Pt appears well and is in NAD  Lungs: Clear with good BS bilat   Heart: Auscultation: RRR.  Extremities:  BL LE: Trace pretibial edema   Mental Status: Judgment, insight: Intact Orientation: Oriented to time, place, and person Mood and affect: No depression, anxiety, or agitation     DATA REVIEWED:    Latest Reference Range & Units 01/10/24 10:40  Free Testosterone  30.0 - 135.0 pg/mL 126.5  Testosterone , Total, LC-MS-MS 250 - 1,100 ng/dL 8,941     Latest Reference Range & Units 01/10/24 10:39  WBC  3.8 - 10.8 Thousand/uL 6.4  RBC 4.20 - 5.80 Million/uL 5.20  Hemoglobin 13.2 - 17.1 g/dL 82.9  HCT 60.5 - 48.8 % 51.2 (H)  MCV 81.4 - 101.7 fL 98.5  MCH 27.0 - 33.0 pg 32.7  MCHC 31.6 - 35.4 g/dL 66.7  RDW 88.9 - 84.9 % 11.3  Platelets 140 - 400 Thousand/uL 111 (L)  MPV 7.5 - 12.5 fL 11.4     Latest Reference Range & Units 06/21/23 09:35  WBC 4.0 - 10.5 K/uL 4.4  RBC 4.22 - 5.81 MIL/uL 4.98  Hemoglobin 13.0 - 17.0 g/dL 84.2  HCT 60.9 - 47.9 % 47.0  MCV 80.0 -  100.0 fL 94.4  MCH 26.0 - 34.0 pg 31.5  MCHC 30.0 - 36.0 g/dL 66.5  RDW 88.4 - 84.4 % 12.4  Platelets 150 - 400 K/uL 164  nRBC 0.0 - 0.2 % 0.0  Neutrophils % 54  Lymphocytes % 27  Monocytes Relative % 14  Eosinophil % 4  Basophil % 1  Immature Granulocytes % 0  NEUT# 1.7 - 7.7 K/uL 2.4  Lymphs Abs 0.7 - 4.0 K/uL 1.2  Monocyte # 0.1 - 1.0 K/uL 0.6  Eosinophils Absolute 0.0 - 0.5 K/uL 0.2  Basophils Absolute 0.0 - 0.1 K/uL 0.1  Abs Immature Granulocytes 0.00 - 0.07 K/uL 0.01      Latest Reference Range & Units 12/30/21 12:03  Testosterone -% Free % 0.6  Testosterone , Serum (Total) ng/dL 514  Free Testosterone , Serum pg/mL 29 (L)  (L): Data is abnormally low   ASSESSMENT / PLAN / RECOMMENDATIONS:    1. Hypogonadism:   - Testosterone  goal for him given advanced age is low normal level. - Part of his total testosterone  elevated is due to elevation of sex hormone binding globulin, it is important to monitor his free testosterone  - Patient is clinically stable - Testosterone  shows normal free testosterone , his total testosterone  is elevated but this is due to elevated sex hormone binding globulin, I will decrease his testosterone  due to elevated hematocrit   Medication  Change testosterone  gel 3 pumps daily (2 pumps to 1 shoulder and 1 pump to the other shoulder daily)   2.  Elevated PSA:  -Will defer to urology  3.  Elevated hematocrit   - This is new - Will continue to monitor - Will adjust  testosterone  accordingly  F/U in 6 months      Signed electronically by: Stefano Redgie Butts, MD  [facility] Endocrinology  [facility] [address] [place], [place] [address] Phone: [phone] FAX: [phone]      CC: Sherlynn Madden, MD [address] [place] [address] Phone: [phone]  Fax: [phone]   Return to Endocrinology clinic as below: Future Appointments  Date Time Provider Department Center  01/10/2024 10:30 AM Markail Diekman, Donell Redgie, MD [facility] None  02/25/2024  1:20 PM Ngetich, Roxan BROCKS, NP [facility] [id number]  06/26/2024 10:00 AM [facility] None  06/26/2024 10:30 AM Onesimo Emaline Brink, MD [facility] None

## 2024-01-11 DIAGNOSIS — R718 Other abnormality of red blood cells: Secondary | ICD-10-CM | POA: Insufficient documentation

## 2024-01-11 LAB — CBC
HCT: 51.2 % — ABNORMAL HIGH (ref 39.4–51.1)
Hemoglobin: 17 g/dL (ref 13.2–17.1)
MCH: 32.7 pg (ref 27.0–33.0)
MCHC: 33.2 g/dL (ref 31.6–35.4)
MCV: 98.5 fL (ref 81.4–101.7)
MPV: 11.4 fL (ref 7.5–12.5)
Platelets: 111 Thousand/uL — ABNORMAL LOW (ref 140–400)
RBC: 5.2 Million/uL (ref 4.20–5.80)
RDW: 11.3 % (ref 11.0–15.0)
WBC: 6.4 Thousand/uL (ref 3.8–10.8)

## 2024-01-14 ENCOUNTER — Ambulatory Visit: Payer: Self-pay | Admitting: Internal Medicine

## 2024-01-14 LAB — TESTOSTERONE, FREE & TOTAL
Free Testosterone: 126.5 pg/mL (ref 30.0–135.0)
Testosterone, Total, LC-MS-MS: 1058 ng/dL (ref 250–1100)

## 2024-01-14 MED ORDER — TESTOSTERONE 20.25 MG/ACT (1.62%) TD GEL: 3.0000 | Freq: Every day | TRANSDERMAL | 5 refills | Status: AC

## 2024-01-14 NOTE — Telephone Encounter (Signed)
-----   Message from Donell Redgie Butts, MD sent at 01/14/2024  2:44 PM EST -----   ----- Message ----- From: Rebecka Hose Lab Results In Sent: 01/11/2024  12:06 AM EST To: Donell Redgie Butts, MD

## 2024-01-14 NOTE — Telephone Encounter (Signed)
 Spoke with patient and made him aware of results and recommendations.  Patient verbalized understanding.

## 2024-01-14 NOTE — Addendum Note (Signed)
 Addended by: SAM DONELL PARAS on: 01/14/2024 02:46 PM   Modules accepted: Orders

## 2024-01-14 NOTE — Telephone Encounter (Signed)
 Can you please contact the patient and let him know that his testosterone  is a little bit higher than it needs to be, which has resulted in slight thickening of the blood.  He is currently on 4 pumps of testosterone  a day (2 to each shoulder) Please asked the patient to DECREASE testosterone  to 3 pumps daily (take 2 pumps to 1 shoulder, and 1 pump to the other shoulder daily)    Thanks

## 2024-02-25 ENCOUNTER — Encounter: Payer: Self-pay | Admitting: Family

## 2024-02-25 ENCOUNTER — Ambulatory Visit: Payer: Medicare PPO | Admitting: Family

## 2024-02-25 VITALS — BP 118/82 | HR 77 | Temp 97.8°F | Ht 73.0 in | Wt 266.0 lb

## 2024-02-25 DIAGNOSIS — Z0000 Encounter for general adult medical examination without abnormal findings: Secondary | ICD-10-CM

## 2024-02-25 NOTE — Progress Notes (Signed)
 "  Chief Complaint  Patient presents with   medicare annual wellness     Annual wellness     Subjective:   Carl [name] is a 84 y.o. male who presents for a Medicare Annual Wellness Visit.  Visit info / Clinical Intake: Medicare Wellness Visit Type:: Subsequent Annual Wellness Visit Persons participating in visit and providing information:: patient Medicare Wellness Visit Mode:: In-person (required for WTM) Interpreter Needed?: No Pre-visit prep was completed: no AWV questionnaire completed by patient prior to visit?: no Living arrangements:: (!) lives alone Patient's Overall Health Status Rating: good Typical amount of pain: none Does pain affect daily life?: no Are you currently prescribed opioids?: no  Dietary Habits and Nutritional Risks How many meals a day?: 3 Eats fruit and vegetables daily?: yes Most meals are obtained by: preparing own meals In the last 2 weeks, have you had any of the following?: none Diabetic:: no  Functional Status Activities of Daily Living (to include ambulation/medication): Independent Ambulation: Independent Medication Administration: Independent Home Management (perform basic housework or laundry): Independent Manage your own finances?: yes Primary transportation is: driving Concerns about vision?: no *vision screening is required for WTM* Concerns about hearing?: no  Fall Screening Falls in the past year?: 0 Number of falls in past year: 0 Was there an injury with Fall?: 0 Fall Risk Category Calculator: 0 Patient Fall Risk Level: Low Fall Risk  Fall Risk Patient at Risk for Falls Due to: No Fall Risks Fall risk Follow up: Falls evaluation completed  Home and Transportation Safety: All rugs have non-skid backing?: yes All stairs or steps have railings?: yes Grab bars in the bathtub or shower?: (!) no Have non-skid surface in bathtub or shower?: yes Good home lighting?: yes Regular seat belt use?: yes Hospital stays in  the last year:: no  Cognitive Assessment Difficulty concentrating, remembering, or making decisions? : yes Will 6CIT or Mini Cog be Completed: yes What year is it?: 0 points What month is it?: 0 points About what time is it?: 0 points Count backwards from 20 to 1: 0 points Say the months of the year in reverse: 0 points Repeat the address phrase from earlier: 0 points 6 CIT Score: 0 points  Advance Directives (For Healthcare) Does Patient Have a Medical Advance Directive?: Yes Type of Advance Directive: Healthcare Power of [place]; Living will; Out of facility DNR (pink MOST or yellow form) (Patient needs updated)  Reviewed/Updated  Reviewed/Updated: Reviewed All (Medical, Surgical, Family, Medications, Allergies, Care Teams, Patient Goals); Medical History; Surgical History; Family History; Medications; Allergies    Allergies (verified) Patient has no known allergies.   Current Medications (verified) Outpatient Encounter Medications as of 02/25/2024  Medication Sig   aspirin  EC 81 MG tablet Take 81 mg by mouth daily.   atorvastatin  (LIPITOR) 40 MG tablet Take 1 tablet (40 mg total) by mouth daily at 6 PM.   Calcium  Carbonate-Vit D-Min (CALCIUM  1200 PO) Take 1 tablet by mouth daily after breakfast.   carvedilol  (COREG ) 6.25 MG tablet Take 3.125 mg by mouth 2 (two) times daily with a meal.    cholecalciferol (VITAMIN D) 1000 UNITS tablet Take 5,000 Units by mouth daily.    losartan  (COZAAR ) 25 MG tablet Take 1 tablet (25 mg total) by mouth daily after breakfast.   Multiple Vitamin (MULTIVITAMIN WITH MINERALS) TABS tablet Take 1 tablet by mouth daily.   nitroGLYCERIN  (NITROSTAT ) 0.4 MG SL tablet Place 1 tablet (0.4 mg total) under the tongue every 5 (five) minutes  x 3 doses as needed for chest pain.   Oyster Shell Calcium  500 MG TABS Take 1 tablet by mouth daily.   Polyethyl Glycol-Propyl Glycol (SYSTANE OP) Place 1 drop into both eyes at bedtime.   tamsulosin (FLOMAX) 0.4 MG CAPS  capsule Take 0.4 mg by mouth daily.   Testosterone  20.25 MG/ACT (1.62%) GEL Apply 3 Pump topically daily in the afternoon.   triamcinolone cream (KENALOG) 0.5 % Apply topically 2 (two) times daily.   white petrolatum (VASELINE) GEL Apply 1 application topically as needed for dry skin (itching).   Facility-Administered Encounter Medications as of 02/25/2024  Medication   influenza vaccine adjuvanted (FLUAD) injection 0.5 mL    History: Past Medical History:  Diagnosis Date   Arthritis    Coronary artery disease    HOH (hard of hearing)    Hyperlipidemia    Hypertension    Ischemic cardiomyopathy    Lymphoma (HCC)    Myocardial infarction (HCC) 2016   Quadriceps tendon rupture    LEFT   Past Surgical History:  Procedure Laterality Date   BIOPSY  04/27/2018   Procedure: BIOPSY;  Surgeon: Saintclair Jasper, MD;  Location: [facility];  Service: Gastroenterology;;   CARDIAC CATHETERIZATION N/A 10/15/2014   Procedure: Left Heart Cath and Coronary Angiography;  Surgeon: Rober Chroman, MD;  Location: [facility] INVASIVE CV LAB;  Service: Cardiovascular;  Laterality: N/A;   COLONOSCOPY     ESOPHAGOGASTRODUODENOSCOPY (EGD) WITH PROPOFOL  N/A 04/27/2018   Procedure: ESOPHAGOGASTRODUODENOSCOPY (EGD) WITH PROPOFOL ;  Surgeon: Saintclair Jasper, MD;  Location: [facility];  Service: Gastroenterology;  Laterality: N/A;   IR IMAGING GUIDED PORT INSERTION  03/14/2018   IR REMOVAL TUN ACCESS W/ PORT W/O FL MOD SED  01/22/2021   MICROLARYNGOSCOPY WITH CO2 LASER AND EXCISION OF VOCAL CORD LESION N/A 11/26/2017   Procedure: MICROLARYNGOSCOPY WITH CO2 LASER AND EXCISION OF VOCAL CORD LESION WITH JET VENTILATION;  Surgeon: Carlie Clark, MD;  Location: [facility] OR;  Service: ENT;  Laterality: N/A;   OTHER SURGICAL HISTORY     Cataract   QUADRICEPS TENDON REPAIR Left 02/20/2014   Procedure: LEFT REPAIR QUADRICEP TENDON;  Surgeon: Donnice JONETTA Car, MD;  Location: [facility];  Service: Orthopedics;  Laterality: Left;   THROAT SURGERY   2010   Family History  Problem Relation Age of Onset   Cancer Mother    Heart attack Father    Breast cancer Sister    Breast cancer Sister    Breast cancer Sister    Breast cancer Sister    Heart attack Brother        2005   Social History   Occupational History   Not on file  Tobacco Use   Smoking status: Former    Types: Cigarettes   Smokeless tobacco: Never  Vaping Use   Vaping status: Never Used  Substance and Sexual Activity   Alcohol use: Not Currently    Comment: Once a week wine   Drug use: Never   Sexual activity: Not on file   Tobacco Counseling Counseling given: Not Answered  SDOH Screenings   Depression (PHQ2-9): Low Risk (02/25/2024)  Tobacco Use: Medium Risk (02/25/2024)   See flowsheets for full screening details  Depression Screen PHQ 2 & 9 Depression Scale- Over the past 2 weeks, how often have you been bothered by any of the following problems? Little interest or pleasure in doing things: 0 Feeling down, depressed, or hopeless (PHQ Adolescent also includes...irritable): 0 PHQ-2 Total Score: 0  Goals Addressed   None          Objective:    Today's Vitals   02/25/24 1313  BP: 118/82  Pulse: 77  Temp: 97.8 F (36.6 C)  SpO2: 97%  Weight: 266 lb (120.7 kg)  Height: 6' 1 (1.854 m)   Body mass index is 35.09 kg/m.  Hearing/Vision screen Hearing Screening - Comments:: Patient last exam 07/2023. Patient gets tested at the [facility].  Vision Screening - Comments:: Last exam 6 months ago  Immunizations and Health Maintenance Health Maintenance  Topic Date Due   Medicare Annual Wellness (AWV)  02/23/2024   COVID-19 Vaccine (10 - 2025-26 season) 03/12/2024 (Originally 01/10/2024)   DTaP/Tdap/Td (4 - Td or Tdap) 04/18/2029   Pneumococcal Vaccine: 50+ Years  Completed   Influenza Vaccine  Completed   Zoster Vaccines- Shingrix  Completed   Meningococcal B Vaccine  Aged Out        Assessment/Plan:  This is a routine wellness  examination for Carl [name].  Patient Care Team: Sherlynn Madden, MD as PCP - General (Internal Medicine) [name], Donell Cardinal, MD as Consulting Physician (Endocrinology)  I have personally reviewed and noted the following in the patients chart:   Medical and social history Use of alcohol, tobacco or illicit drugs  Current medications and supplements including opioid prescriptions. Functional ability and status Nutritional status Physical activity Advanced directives List of other physicians Hospitalizations, surgeries, and ER visits in previous 12 months Vitals Screenings to include cognitive, depression, and falls Referrals and appointments  No orders of the defined types were placed in this encounter.  In addition, I have reviewed and discussed with patient certain preventive protocols, quality metrics, and best practice recommendations. A written personalized care plan for preventive services as well as general preventive health recommendations were provided to patient.   Roxan BROCKS Aneka Fagerstrom, NP   02/25/2024   No follow-ups on file.  After Visit Summary: (In Person-Printed) AVS printed and given to the patient  Nurse Notes: Up to date  "

## 2024-02-25 NOTE — Patient Instructions (Signed)
 Carl [name] , Thank you for taking time to come for your Medicare Wellness Visit. I appreciate your ongoing commitment to your health goals. Please review the following plan we discussed and let me know if I can assist you in the future.   Screening recommendations/referrals: Colonoscopy : N/A  Recommended yearly ophthalmology/optometry visit for glaucoma screening and checkup Recommended yearly dental visit for hygiene and checkup  Vaccinations: Influenza vaccine due annually in September/October Pneumococcal vaccine : Up to date  Tdap vaccine  : Up to date  Shingles vaccine  : Up to date     Advanced directives:   Conditions/risks identified:   Next appointment: 1 year   Preventive Care 80 Years and Older, Male Preventive care refers to lifestyle choices and visits with your health care provider that can promote health and wellness. What does preventive care include? A yearly physical exam. This is also called an annual well check. Dental exams once or twice a year. Routine eye exams. Ask your health care provider how often you should have your eyes checked. Personal lifestyle choices, including: Daily care of your teeth and gums. Regular physical activity. Eating a healthy diet. Avoiding tobacco and drug use. Limiting alcohol use. Practicing safe sex. Taking low doses of aspirin  every day. Taking vitamin and mineral supplements as recommended by your health care provider. What happens during an annual well check? The services and screenings done by your health care provider during your annual well check will depend on your age, overall health, lifestyle risk factors, and family history of disease. Counseling  Your health care provider may ask you questions about your: Alcohol use. Tobacco use. Drug use. Emotional well-being. Home and relationship well-being. Sexual activity. Eating habits. History of falls. Memory and ability to understand (cognition). Work and  work astronomer. Screening  You may have the following tests or measurements: Height, weight, and BMI. Blood pressure. Lipid and cholesterol levels. These may be checked every 5 years, or more frequently if you are over 22 years old. Skin check. Lung cancer screening. You may have this screening every year starting at age 46 if you have a 30-pack-year history of smoking and currently smoke or have quit within the past 15 years. Fecal occult blood test (FOBT) of the stool. You may have this test every year starting at age 27. Flexible sigmoidoscopy or colonoscopy. You may have a sigmoidoscopy every 5 years or a colonoscopy every 10 years starting at age 82. Prostate cancer screening. Recommendations will vary depending on your family history and other risks. Hepatitis C blood test. Hepatitis B blood test. Sexually transmitted disease (STD) testing. Diabetes screening. This is done by checking your blood sugar (glucose) after you have not eaten for a while (fasting). You may have this done every 1-3 years. Abdominal aortic aneurysm (AAA) screening. You may need this if you are a current or former smoker. Osteoporosis. You may be screened starting at age 43 if you are at high risk. Talk with your health care provider about your test results, treatment options, and if necessary, the need for more tests. Vaccines  Your health care provider may recommend certain vaccines, such as: Influenza vaccine. This is recommended every year. Tetanus, diphtheria, and acellular pertussis (Tdap, Td) vaccine. You may need a Td booster every 10 years. Zoster vaccine. You may need this after age 28. Pneumococcal 13-valent conjugate (PCV13) vaccine. One dose is recommended after age 68. Pneumococcal polysaccharide (PPSV23) vaccine. One dose is recommended after age 7. Talk to your  health care provider about which screenings and vaccines you need and how often you need them. This information is not intended to  replace advice given to you by your health care provider. Make sure you discuss any questions you have with your health care provider. Document Released: 02/22/2015 Document Revised: 10/16/2015 Document Reviewed: 11/27/2014 Elsevier Interactive Patient Education  2017 [employer].  Fall Prevention in the Home Falls can cause injuries. They can happen to people of all ages. There are many things you can do to make your home safe and to help prevent falls. What can I do on the outside of my home? Regularly fix the edges of walkways and driveways and fix any cracks. Remove anything that might make you trip as you walk through a door, such as a raised step or threshold. Trim any bushes or trees on the path to your home. Use bright outdoor lighting. Clear any walking paths of anything that might make someone trip, such as rocks or tools. Regularly check to see if handrails are loose or broken. Make sure that both sides of any steps have handrails. Any raised decks and porches should have guardrails on the edges. Have any leaves, snow, or ice cleared regularly. Use sand or salt on walking paths during winter. Clean up any spills in your garage right away. This includes oil or grease spills. What can I do in the bathroom? Use night lights. Install grab bars by the toilet and in the tub and shower. Do not use towel bars as grab bars. Use non-skid mats or decals in the tub or shower. If you need to sit down in the shower, use a plastic, non-slip stool. Keep the floor dry. Clean up any water that spills on the floor as soon as it happens. Remove soap buildup in the tub or shower regularly. Attach bath mats securely with double-sided non-slip rug tape. Do not have throw rugs and other things on the floor that can make you trip. What can I do in the bedroom? Use night lights. Make sure that you have a light by your bed that is easy to reach. Do not use any sheets or blankets that are too big for  your bed. They should not hang down onto the floor. Have a firm chair that has side arms. You can use this for support while you get dressed. Do not have throw rugs and other things on the floor that can make you trip. What can I do in the kitchen? Clean up any spills right away. Avoid walking on wet floors. Keep items that you use a lot in easy-to-reach places. If you need to reach something above you, use a strong step stool that has a grab bar. Keep electrical cords out of the way. Do not use floor polish or wax that makes floors slippery. If you must use wax, use non-skid floor wax. Do not have throw rugs and other things on the floor that can make you trip. What can I do with my stairs? Do not leave any items on the stairs. Make sure that there are handrails on both sides of the stairs and use them. Fix handrails that are broken or loose. Make sure that handrails are as long as the stairways. Check any carpeting to make sure that it is firmly attached to the stairs. Fix any carpet that is loose or worn. Avoid having throw rugs at the top or bottom of the stairs. If you do have throw rugs, attach them  to the floor with carpet tape. Make sure that you have a light switch at the top of the stairs and the bottom of the stairs. If you do not have them, ask someone to add them for you. What else can I do to help prevent falls? Wear shoes that: Do not have high heels. Have rubber bottoms. Are comfortable and fit you well. Are closed at the toe. Do not wear sandals. If you use a stepladder: Make sure that it is fully opened. Do not climb a closed stepladder. Make sure that both sides of the stepladder are locked into place. Ask someone to hold it for you, if possible. Clearly mark and make sure that you can see: Any grab bars or handrails. First and last steps. Where the edge of each step is. Use tools that help you move around (mobility aids) if they are needed. These  include: Canes. Walkers. Scooters. Crutches. Turn on the lights when you go into a dark area. Replace any light bulbs as soon as they burn out. Set up your furniture so you have a clear path. Avoid moving your furniture around. If any of your floors are uneven, fix them. If there are any pets around you, be aware of where they are. Review your medicines with your doctor. Some medicines can make you feel dizzy. This can increase your chance of falling. Ask your doctor what other things that you can do to help prevent falls. This information is not intended to replace advice given to you by your health care provider. Make sure you discuss any questions you have with your health care provider. Document Released: 11/22/2008 Document Revised: 07/04/2015 Document Reviewed: 03/02/2014 Elsevier Interactive Patient Education  2017 [employer].

## 2024-03-28 ENCOUNTER — Ambulatory Visit: Payer: Medicare (Managed Care) | Admitting: Family

## 2024-06-26 ENCOUNTER — Inpatient Hospital Stay: Payer: Medicare (Managed Care) | Admitting: Hematology

## 2024-06-26 ENCOUNTER — Inpatient Hospital Stay: Payer: Medicare (Managed Care)

## 2024-07-11 ENCOUNTER — Ambulatory Visit: Admitting: Internal Medicine
